# Patient Record
Sex: Female | Born: 1955 | Race: Black or African American | Hispanic: No | Marital: Single | Smoking: Former smoker
Health system: Southern US, Community
[De-identification: ages and names within clinical notes are randomized; demographics above are authoritative.]

## PROBLEM LIST (undated history)

## (undated) DIAGNOSIS — G4730 Sleep apnea, unspecified: Secondary | ICD-10-CM

## (undated) DIAGNOSIS — I219 Acute myocardial infarction, unspecified: Secondary | ICD-10-CM

## (undated) DIAGNOSIS — I509 Heart failure, unspecified: Secondary | ICD-10-CM

## (undated) DIAGNOSIS — I2510 Atherosclerotic heart disease of native coronary artery without angina pectoris: Secondary | ICD-10-CM

## (undated) DIAGNOSIS — J45909 Unspecified asthma, uncomplicated: Secondary | ICD-10-CM

## (undated) DIAGNOSIS — E119 Type 2 diabetes mellitus without complications: Secondary | ICD-10-CM

## (undated) DIAGNOSIS — I6523 Occlusion and stenosis of bilateral carotid arteries: Secondary | ICD-10-CM

## (undated) DIAGNOSIS — I10 Essential (primary) hypertension: Secondary | ICD-10-CM

## (undated) DIAGNOSIS — J449 Chronic obstructive pulmonary disease, unspecified: Secondary | ICD-10-CM

## (undated) DIAGNOSIS — I739 Peripheral vascular disease, unspecified: Secondary | ICD-10-CM

## (undated) HISTORY — DX: Peripheral vascular disease, unspecified: I73.9

## (undated) HISTORY — DX: Occlusion and stenosis of bilateral carotid arteries: I65.23

## (undated) HISTORY — DX: Atherosclerotic heart disease of native coronary artery without angina pectoris: I25.10

---

## 2020-03-28 ENCOUNTER — Inpatient Hospital Stay (HOSPITAL_COMMUNITY)
Admission: EM | Admit: 2020-03-28 | Discharge: 2020-04-11 | DRG: 216 | Disposition: A | Discharge status: Home Health | Payer: Medicaid Other | Attending: Thoracic Surgery (Cardiothoracic Vascular Surgery) | Admitting: Thoracic Surgery (Cardiothoracic Vascular Surgery)

## 2020-03-28 ENCOUNTER — Other Ambulatory Visit: Payer: Self-pay

## 2020-03-28 ENCOUNTER — Emergency Department (HOSPITAL_COMMUNITY): Payer: Medicaid Other

## 2020-03-28 ENCOUNTER — Encounter (HOSPITAL_COMMUNITY): Payer: Self-pay | Admitting: Emergency Medicine

## 2020-03-28 DIAGNOSIS — G8929 Other chronic pain: Secondary | ICD-10-CM | POA: Diagnosis present

## 2020-03-28 DIAGNOSIS — K029 Dental caries, unspecified: Secondary | ICD-10-CM | POA: Diagnosis present

## 2020-03-28 DIAGNOSIS — D62 Acute posthemorrhagic anemia: Secondary | ICD-10-CM | POA: Diagnosis present

## 2020-03-28 DIAGNOSIS — Z7902 Long term (current) use of antithrombotics/antiplatelets: Secondary | ICD-10-CM | POA: Diagnosis not present

## 2020-03-28 DIAGNOSIS — I5043 Acute on chronic combined systolic (congestive) and diastolic (congestive) heart failure: Secondary | ICD-10-CM | POA: Diagnosis not present

## 2020-03-28 DIAGNOSIS — Z7982 Long term (current) use of aspirin: Secondary | ICD-10-CM

## 2020-03-28 DIAGNOSIS — T82855A Stenosis of coronary artery stent, initial encounter: Secondary | ICD-10-CM | POA: Diagnosis present

## 2020-03-28 DIAGNOSIS — I110 Hypertensive heart disease with heart failure: Secondary | ICD-10-CM | POA: Diagnosis present

## 2020-03-28 DIAGNOSIS — E119 Type 2 diabetes mellitus without complications: Secondary | ICD-10-CM

## 2020-03-28 DIAGNOSIS — Z79899 Other long term (current) drug therapy: Secondary | ICD-10-CM

## 2020-03-28 DIAGNOSIS — R079 Chest pain, unspecified: Secondary | ICD-10-CM | POA: Diagnosis not present

## 2020-03-28 DIAGNOSIS — I080 Rheumatic disorders of both mitral and aortic valves: Secondary | ICD-10-CM | POA: Diagnosis present

## 2020-03-28 DIAGNOSIS — Z951 Presence of aortocoronary bypass graft: Secondary | ICD-10-CM | POA: Diagnosis not present

## 2020-03-28 DIAGNOSIS — Z20822 Contact with and (suspected) exposure to covid-19: Secondary | ICD-10-CM | POA: Diagnosis not present

## 2020-03-28 DIAGNOSIS — I10 Essential (primary) hypertension: Secondary | ICD-10-CM

## 2020-03-28 DIAGNOSIS — Y831 Surgical operation with implant of artificial internal device as the cause of abnormal reaction of the patient, or of later complication, without mention of misadventure at the time of the procedure: Secondary | ICD-10-CM | POA: Diagnosis present

## 2020-03-28 DIAGNOSIS — I252 Old myocardial infarction: Secondary | ICD-10-CM

## 2020-03-28 DIAGNOSIS — J449 Chronic obstructive pulmonary disease, unspecified: Secondary | ICD-10-CM | POA: Diagnosis present

## 2020-03-28 DIAGNOSIS — E1151 Type 2 diabetes mellitus with diabetic peripheral angiopathy without gangrene: Secondary | ICD-10-CM | POA: Diagnosis present

## 2020-03-28 DIAGNOSIS — Z955 Presence of coronary angioplasty implant and graft: Secondary | ICD-10-CM | POA: Diagnosis not present

## 2020-03-28 DIAGNOSIS — Z91041 Radiographic dye allergy status: Secondary | ICD-10-CM

## 2020-03-28 DIAGNOSIS — F419 Anxiety disorder, unspecified: Secondary | ICD-10-CM | POA: Diagnosis not present

## 2020-03-28 DIAGNOSIS — D72828 Other elevated white blood cell count: Secondary | ICD-10-CM | POA: Diagnosis not present

## 2020-03-28 DIAGNOSIS — Z794 Long term (current) use of insulin: Secondary | ICD-10-CM

## 2020-03-28 DIAGNOSIS — E7800 Pure hypercholesterolemia, unspecified: Secondary | ICD-10-CM | POA: Diagnosis not present

## 2020-03-28 DIAGNOSIS — Z09 Encounter for follow-up examination after completed treatment for conditions other than malignant neoplasm: Secondary | ICD-10-CM

## 2020-03-28 DIAGNOSIS — I472 Ventricular tachycardia: Secondary | ICD-10-CM | POA: Diagnosis not present

## 2020-03-28 DIAGNOSIS — G4733 Obstructive sleep apnea (adult) (pediatric): Secondary | ICD-10-CM | POA: Diagnosis present

## 2020-03-28 DIAGNOSIS — Z953 Presence of xenogenic heart valve: Secondary | ICD-10-CM

## 2020-03-28 DIAGNOSIS — Z6841 Body Mass Index (BMI) 40.0 and over, adult: Secondary | ICD-10-CM | POA: Diagnosis not present

## 2020-03-28 DIAGNOSIS — Z952 Presence of prosthetic heart valve: Secondary | ICD-10-CM

## 2020-03-28 DIAGNOSIS — J939 Pneumothorax, unspecified: Secondary | ICD-10-CM

## 2020-03-28 DIAGNOSIS — R0789 Other chest pain: Secondary | ICD-10-CM | POA: Diagnosis present

## 2020-03-28 DIAGNOSIS — E785 Hyperlipidemia, unspecified: Secondary | ICD-10-CM | POA: Diagnosis present

## 2020-03-28 DIAGNOSIS — I255 Ischemic cardiomyopathy: Secondary | ICD-10-CM | POA: Diagnosis present

## 2020-03-28 DIAGNOSIS — I350 Nonrheumatic aortic (valve) stenosis: Secondary | ICD-10-CM | POA: Diagnosis not present

## 2020-03-28 DIAGNOSIS — I351 Nonrheumatic aortic (valve) insufficiency: Secondary | ICD-10-CM | POA: Diagnosis not present

## 2020-03-28 DIAGNOSIS — I951 Orthostatic hypotension: Secondary | ICD-10-CM | POA: Diagnosis not present

## 2020-03-28 DIAGNOSIS — J45909 Unspecified asthma, uncomplicated: Secondary | ICD-10-CM

## 2020-03-28 DIAGNOSIS — Z9889 Other specified postprocedural states: Secondary | ICD-10-CM

## 2020-03-28 DIAGNOSIS — I200 Unstable angina: Secondary | ICD-10-CM | POA: Diagnosis not present

## 2020-03-28 DIAGNOSIS — I471 Supraventricular tachycardia: Secondary | ICD-10-CM | POA: Diagnosis not present

## 2020-03-28 DIAGNOSIS — I2510 Atherosclerotic heart disease of native coronary artery without angina pectoris: Secondary | ICD-10-CM | POA: Diagnosis present

## 2020-03-28 DIAGNOSIS — D649 Anemia, unspecified: Secondary | ICD-10-CM | POA: Diagnosis not present

## 2020-03-28 DIAGNOSIS — K922 Gastrointestinal hemorrhage, unspecified: Secondary | ICD-10-CM | POA: Diagnosis present

## 2020-03-28 DIAGNOSIS — Z006 Encounter for examination for normal comparison and control in clinical research program: Secondary | ICD-10-CM | POA: Diagnosis not present

## 2020-03-28 DIAGNOSIS — Z87891 Personal history of nicotine dependence: Secondary | ICD-10-CM

## 2020-03-28 DIAGNOSIS — I4891 Unspecified atrial fibrillation: Secondary | ICD-10-CM | POA: Diagnosis not present

## 2020-03-28 DIAGNOSIS — I25110 Atherosclerotic heart disease of native coronary artery with unstable angina pectoris: Secondary | ICD-10-CM | POA: Diagnosis present

## 2020-03-28 DIAGNOSIS — I480 Paroxysmal atrial fibrillation: Secondary | ICD-10-CM | POA: Diagnosis not present

## 2020-03-28 DIAGNOSIS — D509 Iron deficiency anemia, unspecified: Secondary | ICD-10-CM | POA: Diagnosis not present

## 2020-03-28 DIAGNOSIS — I340 Nonrheumatic mitral (valve) insufficiency: Secondary | ICD-10-CM | POA: Diagnosis not present

## 2020-03-28 DIAGNOSIS — Z01810 Encounter for preprocedural cardiovascular examination: Secondary | ICD-10-CM | POA: Diagnosis not present

## 2020-03-28 HISTORY — DX: Unspecified asthma, uncomplicated: J45.909

## 2020-03-28 HISTORY — DX: Essential (primary) hypertension: I10

## 2020-03-28 HISTORY — DX: Type 2 diabetes mellitus without complications: E11.9

## 2020-03-28 HISTORY — DX: Sleep apnea, unspecified: G47.30

## 2020-03-28 HISTORY — DX: Acute myocardial infarction, unspecified: I21.9

## 2020-03-28 HISTORY — DX: Heart failure, unspecified: I50.9

## 2020-03-28 HISTORY — DX: Chronic obstructive pulmonary disease, unspecified: J44.9

## 2020-03-28 LAB — CBC
HCT: 26.6 % — ABNORMAL LOW (ref 36.0–46.0)
HCT: 27.9 % — ABNORMAL LOW (ref 36.0–46.0)
Hemoglobin: 7.6 g/dL — ABNORMAL LOW (ref 12.0–15.0)
Hemoglobin: 8 g/dL — ABNORMAL LOW (ref 12.0–15.0)
MCH: 24.7 pg — ABNORMAL LOW (ref 26.0–34.0)
MCH: 25 pg — ABNORMAL LOW (ref 26.0–34.0)
MCHC: 28.6 g/dL — ABNORMAL LOW (ref 30.0–36.0)
MCHC: 28.7 g/dL — ABNORMAL LOW (ref 30.0–36.0)
MCV: 86.4 fL (ref 80.0–100.0)
MCV: 87.2 fL (ref 80.0–100.0)
Platelets: 273 10*3/uL (ref 150–400)
Platelets: 240 10*3/uL (ref 150–400)
RBC: 3.08 MIL/uL — ABNORMAL LOW (ref 3.87–5.11)
RBC: 3.2 MIL/uL — ABNORMAL LOW (ref 3.87–5.11)
RDW: 18.5 % — ABNORMAL HIGH (ref 11.5–15.5)
RDW: 17.8 % — ABNORMAL HIGH (ref 11.5–15.5)
WBC: 8.1 10*3/uL (ref 4.0–10.5)
WBC: 8.8 10*3/uL (ref 4.0–10.5)
nRBC: 0 % (ref 0.0–0.2)
nRBC: 0 % (ref 0.0–0.2)

## 2020-03-28 LAB — CBG MONITORING, ED
Glucose-Capillary: 123 mg/dL — ABNORMAL HIGH (ref 70–99)
Glucose-Capillary: 136 mg/dL — ABNORMAL HIGH (ref 70–99)

## 2020-03-28 LAB — BASIC METABOLIC PANEL
Anion gap: 10 (ref 5–15)
BUN: 18 mg/dL (ref 8–23)
CO2: 24 mmol/L (ref 22–32)
Calcium: 8.8 mg/dL — ABNORMAL LOW (ref 8.9–10.3)
Chloride: 106 mmol/L (ref 98–111)
Creatinine, Ser: 0.72 mg/dL (ref 0.44–1.00)
GFR, Estimated: >60 mL/min (ref >60)
Glucose, Bld: 150 mg/dL — ABNORMAL HIGH (ref 70–99)
Potassium: 3.9 mmol/L (ref 3.5–5.1)
Sodium: 140 mmol/L (ref 135–145)

## 2020-03-28 LAB — HIV ANTIBODY (ROUTINE TESTING W REFLEX): HIV Screen 4th Generation wRfx: NONREACTIVE

## 2020-03-28 LAB — TROPONIN I (HIGH SENSITIVITY)
Troponin I (High Sensitivity): 5 ng/L (ref <18)
Troponin I (High Sensitivity): 6 ng/L (ref <18)

## 2020-03-28 LAB — PREPARE RBC (CROSSMATCH)

## 2020-03-28 LAB — FERRITIN: Ferritin: 32 ng/mL (ref 11–307)

## 2020-03-28 LAB — IRON AND TIBC
Iron: 27 ug/dL — ABNORMAL LOW (ref 28–170)
Saturation Ratios: 7 % — ABNORMAL LOW (ref 10.4–31.8)
TIBC: 403 ug/dL (ref 250–450)
UIBC: 376 ug/dL

## 2020-03-28 LAB — SARS CORONAVIRUS 2 (TAT 6-24 HRS): SARS Coronavirus 2: NEGATIVE

## 2020-03-28 LAB — ABO/RH: ABO/RH(D): A POS

## 2020-03-28 MED ORDER — SODIUM CHLORIDE 0.9% IV SOLUTION: Freq: Once | INTRAVENOUS | Status: DC

## 2020-03-28 MED ORDER — INSULIN GLARGINE 100 UNIT/ML ~~LOC~~ SOLN
8.0000 [IU] | Freq: Every day | SUBCUTANEOUS | Status: DC
  Filled 2020-03-28: qty 0.08

## 2020-03-28 MED ORDER — ATORVASTATIN CALCIUM 80 MG PO TABS
80.0000 mg | ORAL_TABLET | Freq: Every day | ORAL | Status: DC
  Administered 2020-03-28 – 2020-04-11 (×14): 80 mg via ORAL
  Filled 2020-03-28 (×14): qty 1

## 2020-03-28 MED ORDER — MONTELUKAST SODIUM 10 MG PO TABS
10.0000 mg | ORAL_TABLET | Freq: Every day | ORAL | Status: DC
Start: 2020-03-28 — End: 2020-04-11
  Administered 2020-03-28 – 2020-04-11 (×14): 10 mg via ORAL
  Filled 2020-03-28 (×14): qty 1

## 2020-03-28 MED ORDER — ALBUTEROL SULFATE HFA 108 (90 BASE) MCG/ACT IN AERS
2.0000 | INHALATION_SPRAY | Freq: Four times a day (QID) | RESPIRATORY_TRACT | Status: DC | PRN
  Filled 2020-03-28: qty 6.7

## 2020-03-28 MED ORDER — ONDANSETRON HCL 4 MG/2ML IJ SOLN
4.0000 mg | Freq: Four times a day (QID) | INTRAMUSCULAR | Status: DC | PRN
  Administered 2020-03-28: 18:00:00 4 mg via INTRAVENOUS
  Filled 2020-03-28: qty 2

## 2020-03-28 MED ORDER — INSULIN GLARGINE 100 UNIT/ML ~~LOC~~ SOLN
15.0000 [IU] | Freq: Every day | SUBCUTANEOUS | Status: DC
  Filled 2020-03-28 (×4): qty 0.15

## 2020-03-28 MED ORDER — ONDANSETRON HCL 4 MG PO TABS: 4.0000 mg | ORAL_TABLET | Freq: Four times a day (QID) | ORAL | Status: DC | PRN

## 2020-03-28 MED ORDER — VITAMIN D 25 MCG (1000 UNIT) PO TABS
2000.0000 [IU] | ORAL_TABLET | Freq: Every day | ORAL | Status: DC
  Administered 2020-03-28 – 2020-04-11 (×14): 2000 [IU] via ORAL
  Filled 2020-03-28 (×14): qty 2

## 2020-03-28 MED ORDER — METOPROLOL SUCCINATE ER 25 MG PO TB24
25.0000 mg | ORAL_TABLET | Freq: Every day | ORAL | Status: DC
  Administered 2020-03-28 – 2020-03-31 (×4): 25 mg via ORAL
  Filled 2020-03-28 (×4): qty 1

## 2020-03-28 MED ORDER — MOMETASONE FURO-FORMOTEROL FUM 200-5 MCG/ACT IN AERO
2.0000 | INHALATION_SPRAY | Freq: Two times a day (BID) | RESPIRATORY_TRACT | Status: DC
  Administered 2020-03-29 – 2020-04-11 (×22): 2 via RESPIRATORY_TRACT
  Filled 2020-03-28 (×2): qty 8.8

## 2020-03-28 MED ORDER — ASPIRIN EC 81 MG PO TBEC
81.0000 mg | DELAYED_RELEASE_TABLET | Freq: Every day | ORAL | Status: DC
  Administered 2020-03-28 – 2020-04-02 (×6): 81 mg via ORAL
  Filled 2020-03-28 (×6): qty 1

## 2020-03-28 MED ORDER — FAMOTIDINE 20 MG PO TABS
20.0000 mg | ORAL_TABLET | Freq: Every day | ORAL | Status: DC
  Administered 2020-03-28 – 2020-04-02 (×6): 20 mg via ORAL
  Filled 2020-03-28 (×6): qty 1

## 2020-03-28 MED ORDER — CLOPIDOGREL BISULFATE 75 MG PO TABS
75.0000 mg | ORAL_TABLET | Freq: Every day | ORAL | Status: DC
  Administered 2020-03-28 – 2020-03-30 (×3): 75 mg via ORAL
  Filled 2020-03-28 (×3): qty 1

## 2020-03-28 MED ORDER — ACETAMINOPHEN 650 MG RE SUPP: 650.0000 mg | Freq: Four times a day (QID) | RECTAL | Status: DC | PRN

## 2020-03-28 MED ORDER — FUROSEMIDE 20 MG PO TABS: 40.0000 mg | ORAL_TABLET | Freq: Every day | ORAL | Status: DC

## 2020-03-28 MED ORDER — CLOPIDOGREL BISULFATE 75 MG PO TABS: 75.0000 mg | ORAL_TABLET | Freq: Every day | ORAL | Status: DC

## 2020-03-28 MED ORDER — SODIUM CHLORIDE 0.9 % IV SOLN: Freq: Once | INTRAVENOUS | Status: AC

## 2020-03-28 MED ORDER — LISINOPRIL 5 MG PO TABS
2.5000 mg | ORAL_TABLET | Freq: Every day | ORAL | Status: DC
  Administered 2020-03-28 – 2020-04-02 (×6): 2.5 mg via ORAL
  Filled 2020-03-28 (×7): qty 1

## 2020-03-28 MED ORDER — FERROUS SULFATE 325 (65 FE) MG PO TABS
325.0000 mg | ORAL_TABLET | Freq: Two times a day (BID) | ORAL | Status: DC
Start: 2020-03-28 — End: 2020-04-11
  Administered 2020-03-28 – 2020-04-11 (×27): 325 mg via ORAL
  Filled 2020-03-28 (×28): qty 1

## 2020-03-28 MED ORDER — ACETAMINOPHEN 325 MG PO TABS
650.0000 mg | ORAL_TABLET | Freq: Four times a day (QID) | ORAL | Status: DC | PRN
  Filled 2020-03-28: qty 2

## 2020-03-28 MED ORDER — INSULIN ASPART 100 UNIT/ML ~~LOC~~ SOLN
0.0000 [IU] | SUBCUTANEOUS | Status: DC
  Administered 2020-03-28 – 2020-03-29 (×2): 1 [IU] via SUBCUTANEOUS
  Administered 2020-03-29: 13:00:00 2 [IU] via SUBCUTANEOUS
  Administered 2020-03-30: 08:00:00 1 [IU] via SUBCUTANEOUS
  Administered 2020-03-30: 17:00:00 5 [IU] via SUBCUTANEOUS
  Administered 2020-03-30: 12:00:00 3 [IU] via SUBCUTANEOUS

## 2020-03-28 MED ORDER — ASPIRIN 81 MG PO TBEC: 81.0000 mg | DELAYED_RELEASE_TABLET | Freq: Every day | ORAL | Status: DC

## 2020-03-28 MED ORDER — PANTOPRAZOLE SODIUM 40 MG IV SOLR
40.0000 mg | INTRAVENOUS | Status: DC
  Administered 2020-03-28 – 2020-04-02 (×6): 40 mg via INTRAVENOUS
  Filled 2020-03-28 (×7): qty 40

## 2020-03-28 MED ORDER — FUROSEMIDE 40 MG PO TABS
40.0000 mg | ORAL_TABLET | Freq: Every day | ORAL | Status: DC
  Administered 2020-03-28 – 2020-04-02 (×6): 40 mg via ORAL
  Filled 2020-03-28 (×3): qty 1
  Filled 2020-03-28: qty 2
  Filled 2020-03-28 (×2): qty 1

## 2020-03-28 NOTE — ED Notes (Signed)
Pt aao4, gcs15, nadn, vss on monitor, reports feeling better, upset because she is npo, pt educated and updated on wait times. Call bell in reach side rails up, ccm in place. Sinus rhythm on monitor.

## 2020-03-28 NOTE — ED Notes (Signed)
Transport paged

## 2020-03-28 NOTE — ED Triage Notes (Signed)
Pt c/o chest pain x2 weeks, states she was recently discharged from [facility] with hgb of 7 due to some "bleeding vessels in my intestines" states she has had cp since leaving the hospital. Has called GI and PCP but has not heard back from either one.

## 2020-03-28 NOTE — Hospital Course (Addendum)
Admitted 03/28/2020  Allergies: Iodine, Peanut-containing drug products, and Statins Pertinent Hx: ***CHF, MI S/P stent 05/2019 hypertension, diabetes, COPD, asthma, sleep apnea, IDDM2, obesity  64 y.o. female p/w worsening of exersional chest pain  * Exersional chest pain: Likely symptomatic anemia 2/2 recent ***. Hb 7.6. Hx of GI bleeding and prior transfusion. She was hospitalized for similar symptoms before: Capsul endoscopy showed bleeding from AVM at [facility] and discharged home on 12/24. Per pt, plan was referring to another facility for push endoscopy. Receiving IVF in ED. Ordering pRBC given symptomatic anemia. NPO, PPI, GI rec is pending. Will continue ASA and plavix for stent placed 05/2019. EDP also consulted cards given cardiac Hx. Rec pending.   *HTN, CHF: CXR with some infiltration but volume status OK on exam. Holding home Lasix and antihypertensive due to soft BP and probable active GI bleeding  Consults: GI, cards Meds: *** VTE ppx: SCDs  IVF: NS Diet: npo until clear by GI   Fleet Contras*  []  f/u post transfusion Hb and assess symptoms after transfusion []  ***f/u Gi rec []  f/u cards rec []  f/u COVID test result  O/N events:   Trop 5>6 COVID pending  BMP Latest Ref Rng & Units 03/28/2020  Glucose 70 - 99 mg/dL )  BUN 8 - 23 mg/dL 18  Creatinine - mg/dL 03/30/2020  Sodium 270(J - 5.00 mmol/L 140  Potassium 3.5 - 5.1 mmol/L 3.9  Chloride 98 - 111 mmol/L 106  CO2 22 - 32 mmol/L 24  Calcium 8.9 - 10.3 mg/dL 9.38)    CBC Latest Ref Rng & Units 03/28/2020  WBC 4.0 - 10.5 K/uL 8.1  Hemoglobin 12.0 - 15.0 g/dL 7.6(L)  Hematocrit 36.0 - 46.0 % 26.6(L)  Platelets 150 - 400 K/uL 273

## 2020-03-28 NOTE — ED Provider Notes (Signed)
[facility] EMERGENCY DEPARTMENT Provider Note   CSN: [id number] Arrival date & time: 03/28/20  5284     History Chief Complaint  Patient presents with  . Chest Pain    Heidi Morse is a 64 y.o. female history of CHF, MI (cardiac catheterization March 202, drug-eluting stent placement to ostial LAD stenosis), hypertension, diabetes, COPD, asthma, sleep apnea.  Patient presents with a chief complaint of chest pain.  Patient reports that her chest pain has been present for the past 3 weeks.  Patient reports that her chest pain is intermittent and only experiences it when she is walking.  Pain  is substernal, does not radiate, not associated with nausea, vomiting or diaphoresis.  Reports that her pain improves with rest and usually takes 15 minutes for her pain to dissipate.  She describes the pain as a burning sensation rated as a 10 on 10 the pain scale.  Patient also complains of shortness of breath with exertion.  patient denies any association with food and pleuritic chest pain.     HPI  HPI: A 65 year old patient with a history of hypertension, hypercholesterolemia and obesity presents for evaluation of chest pain. Initial onset of pain was more than 6 hours ago. The patient's chest pain is worse with exertion. The patient's chest pain is middle- or left-sided, is not well-localized, is not described as heaviness/pressure/tightness, is not sharp and does not radiate to the arms/jaw/neck. The patient does not complain of nausea and denies diaphoresis. The patient has no history of stroke, has no history of peripheral artery disease, has not smoked in the past 90 days, denies any history of treated diabetes and has no relevant family history of coronary artery disease (first degree relative at less than age 48).   Past Medical History:  Diagnosis Date  . Asthma   . CHF (congestive heart failure) (HCC)   . COPD (chronic obstructive pulmonary disease) (HCC)   . Diabetes  mellitus without complication (HCC)   . Hypertension   . MI (myocardial infarction) (HCC)   . Sleep apnea     Patient Active Problem List   Diagnosis Date Noted  . Symptomatic anemia 03/28/2020    History reviewed. No pertinent surgical history.   OB History   No obstetric history on file.     No family history on file.     Home Medications Prior to Admission medications   Medication Sig Start Date End Date Taking? Authorizing Provider  albuterol (VENTOLIN HFA) 108 (90 Base) MCG/ACT inhaler Inhale 2 puffs into the lungs every 6 (six) hours as needed for wheezing. 03/09/20  Yes [provider]  amLODipine (NORVASC) 2.5 MG tablet Take 2.5 mg by mouth daily. 03/11/20  Yes [provider]  aspirin 81 MG EC tablet Take 81 mg by mouth daily. 08/18/18  Yes [provider]  atorvastatin (LIPITOR) 80 MG tablet Take 80 mg by mouth daily. 03/09/20  Yes [provider]  Cholecalciferol 25 MCG (1000 UT) tablet Take 2,000 Units by mouth daily. 02/08/20 05/08/20 Yes [provider]  clopidogrel (PLAVIX) 75 MG tablet Take 75 mg by mouth daily. 02/29/20  Yes [provider]  famotidine (PEPCID) 20 MG tablet Take 20 mg by mouth daily. 12/14/19  Yes [provider]  ferrous sulfate 325 (65 FE) MG tablet Take 1 tablet by mouth 2 (two) times daily. 03/24/20  Yes [provider]  Fluticasone-Salmeterol (ADVAIR) 250-50 MCG/DOSE AEPB Inhale 1 puff into the lungs in the morning  and at bedtime. 01/27/20  Yes [provider]  furosemide (LASIX) 40 MG tablet Take 40 mg by mouth daily. 03/06/20  Yes [provider]  gabapentin (NEURONTIN) 300 MG capsule Take 300 mg by mouth in the morning, at noon, in the evening, and at bedtime. 02/01/20  Yes [provider]  HYDROcodone-acetaminophen (NORCO) 10-325 MG tablet Take 1 tablet by mouth every 12 (twelve) hours. 03/05/20  Yes [provider]  insulin glargine  (LANTUS) 100 UNIT/ML injection Inject 24 Units into the skin 2 (two) times daily as needed (for elevated blood over 150). 11/23/19  Yes [provider]  lisinopril (ZESTRIL) 2.5 MG tablet Take 2.5 mg by mouth daily. 03/24/20  Yes [provider]  metoprolol succinate (TOPROL-XL) 25 MG 24 hr tablet Take 25 mg by mouth daily. 03/24/20  Yes [provider]  montelukast (SINGULAIR) 10 MG tablet Take 10 mg by mouth daily. 01/05/20  Yes [provider]  nitroGLYCERIN (NITROSTAT) 0.4 MG SL tablet Place 0.4 mg under the tongue every 5 (five) minutes as needed for chest pain. 02/08/20 02/07/21 Yes [provider]  valACYclovir (VALTREX) 500 MG tablet Take 500 mg by mouth as needed (for flare ups).   Yes [provider]    Allergies    Iodine, Peanut-containing drug products, and Statins  Review of Systems   Review of Systems  Constitutional: Negative for chills and fever.  Eyes: Negative for visual disturbance.  Respiratory: Positive for cough (productive) and shortness of breath (w/ exertion).   Cardiovascular: Positive for chest pain (w/ exertion).  Gastrointestinal: Negative for abdominal distention, abdominal pain, anal bleeding, blood in stool, nausea and vomiting.  Genitourinary: Negative for difficulty urinating and dysuria.  Musculoskeletal: Negative for back pain and neck pain.  Skin: Negative for color change and rash.  Neurological: Negative for dizziness, syncope, light-headedness and headaches.  Psychiatric/Behavioral: Negative for confusion.    Physical Exam Updated Vital Signs BP 120/67   Pulse 94   Temp 98.2 F (36.8 C) (Oral)   Resp 17   Ht 5' (1.524 m)   Wt 106.1 kg   LMP  (LMP Unknown)   SpO2 100%   BMI 45.70 kg/m   Physical Exam Vitals and nursing note reviewed.  Constitutional:      General: She is not in acute distress.    Appearance: She is not ill-appearing, toxic-appearing or diaphoretic.  HENT:     Head:  Normocephalic.  Eyes:     General: No scleral icterus.       Right eye: No discharge.        Left eye: No discharge.  Neck:     Vascular: No JVD.  Cardiovascular:     Rate and Rhythm: Normal rate.     Heart sounds: Normal heart sounds.  Pulmonary:     Effort: Pulmonary effort is normal.     Breath sounds: Normal breath sounds.  Abdominal:     General: There is no distension.     Palpations: Abdomen is soft. There is no mass or pulsatile mass.     Tenderness: There is no abdominal tenderness.  Musculoskeletal:     Cervical back: Neck supple.     Right lower leg: No edema.     Left lower leg: No edema.  Skin:    General: Skin is warm and dry.  Neurological:     General: No focal deficit present.     Mental Status: She is alert.  Psychiatric:  Behavior: Behavior is cooperative.     ED Results / Procedures / Treatments   Labs (all labs ordered are listed, but only abnormal results are displayed) Labs Reviewed  BASIC METABOLIC PANEL - Abnormal; Notable for the following components:      Result Value   Glucose, Bld 150 (*)    Calcium 8.8 (*)    All other components within normal limits  CBC - Abnormal; Notable for the following components:   RBC 3.08 (*)    Hemoglobin 7.6 (*)    HCT 26.6 (*)    MCH 24.7 (*)    MCHC 28.6 (*)    RDW 18.5 (*)    All other components within normal limits  SARS CORONAVIRUS 2 (TAT 6-24 HRS)  HIV ANTIBODY (ROUTINE TESTING W REFLEX)  HEMOGLOBIN A1C  FERRITIN  IRON AND TIBC  TYPE AND SCREEN  ABO/RH  PREPARE RBC (CROSSMATCH)  TROPONIN I (HIGH SENSITIVITY)  TROPONIN I (HIGH SENSITIVITY)    EKG EKG Interpretation  Date/Time:  Tuesday March 28 2020 07:06:13 EST Ventricular Rate:  81 PR Interval:  140 QRS Duration: 86 QT Interval:  392 QTC Calculation: 455 R Axis:   48 Text Interpretation: Normal sinus rhythm Nonspecific ST abnormality Abnormal ECG Confirmed by Margarita Grizzle [phone]) on 03/28/2020 10:33:40  AM   Radiology DG Chest 2 View  Result Date: 03/28/2020 CLINICAL DATA:  Chest pain 2 weeks EXAM: CHEST - 2 VIEW COMPARISON:  03/24/2020 and prior. FINDINGS: Cardiomegaly and central pulmonary vascular congestion. Patchy bibasilar opacities. No pneumothorax or pleural effusion. No acute osseous abnormality. IMPRESSION: Patchy basilar opacities may reflect atelectasis, edema or infection. Cardiomegaly and central pulmonary vascular congestion. Electronically Signed   By: Stana Bunting M.D.   On: 03/28/2020 08:00    Procedures Procedures (including critical care time)  Medications Ordered in ED Medications  acetaminophen (TYLENOL) tablet 650 mg (has no administration in time range)    Or  acetaminophen (TYLENOL) suppository 650 mg (has no administration in time range)  ondansetron (ZOFRAN) tablet 4 mg (has no administration in time range)    Or  ondansetron (ZOFRAN) injection 4 mg (has no administration in time range)  0.9 %  sodium chloride infusion (Manually program via Guardrails IV Fluids) (has no administration in time range)  albuterol (VENTOLIN HFA) 108 (90 Base) MCG/ACT inhaler 2 puff (has no administration in time range)  atorvastatin (LIPITOR) tablet 80 mg (has no administration in time range)  cholecalciferol (VITAMIN D3) tablet 2,000 Units (has no administration in time range)  famotidine (PEPCID) tablet 20 mg (has no administration in time range)  ferrous sulfate tablet 325 mg (has no administration in time range)  mometasone-formoterol (DULERA) 200-5 MCG/ACT inhaler 2 puff (has no administration in time range)  montelukast (SINGULAIR) tablet 10 mg (has no administration in time range)  insulin glargine (LANTUS) injection 8 Units (has no administration in time range)  insulin aspart (novoLOG) injection 0-9 Units (has no administration in time range)  aspirin EC tablet 81 mg (has no administration in time range)  clopidogrel (PLAVIX) tablet 75 mg (has no administration  in time range)  pantoprazole (PROTONIX) injection 40 mg (has no administration in time range)  0.9 %  sodium chloride infusion ( Intravenous New Bag/Given 03/28/20 1226)    ED Course  I have reviewed the triage vital signs and the nursing notes.  Pertinent labs & imaging results that were available during my care of the patient were reviewed by me and considered in my medical decision  making (see chart for details).    MDM Rules/Calculators/A&P HEAR Score: 4                        Alert 64 year old female. Patient presents with exertional chest pain and shortness of breath has had for the past 3 weeks. Per chart review patient was admitted to [facility] hospital 12/23-24 for acute blood loss anemia, gastrointestinal hemorrhage, and precordial chest pain. Patient had pill endoscopy which showed bleeding AVMS, bleeding scan was performed and found to be negative, she did not receive any transfusion. Per chart review previous hemoglobin was found to be 7.6, hematocrit 24.7, platelets 246.  Patient reports that her exertional chest pain and shortness of breath have not worsened but she is concerned because they have not improved. Patient denies any bleeding, melena, blood in stool. Patient is on Plavix.  CBC showed hemoglobin 7.6, hematocrit 26.6, RBC 3.08, platelets 273. Troponin and was 5 and 6 with delta of +1. With flat troponin less likely ACS. Low risk for PE using Wells criteria for PE.    Likely that patient's exertional shortness of breath and chest pain are related to symptomatic anemia. Will consult hospitalist, GI and cardiology. Type and screen ordered.  Chest x-ray showed patchy basilar opacities may reflect atelectasis, edema or infection. This reading is concerning for possible Covid. COVID-19 test and precautions were ordered.  12:14 spoke with cards manager about consult.    12:25 Spoke with gastroenterology, Dr. Clarita Leber about consult for this patient.    12:40  spoke with hospatilist Dr. Maryla Morrow, she agreed to admit the patient.    13:00 cardiology reports they will not be admitting due to patient's low troponins.    Patient was discussed with and evaluated by Dr. Rosalia Hammers.   Final Clinical Impression(s) / ED Diagnoses Final diagnoses:  Anemia, unspecified type    Rx / DC Orders ED Discharge Orders    None       Haskel Schroeder, PA-C 03/28/20 1558    Margarita Grizzle, MD 03/28/20 [phone]

## 2020-03-28 NOTE — H&P (Addendum)
Date: 03/28/2020               Patient Name:  Heidi Morse MRN: [id number]  DOB: [birth year] Age / Sex: 64 y.o., female   PCP: Heidi Brazil, DO         Medical Service: Internal Medicine Teaching Service         Attending Physician: Dr. Sandre Kitty Elwin Mocha, MD    First Contact: Dr. Laddie Aquas Pager: [phone]  Second Contact: Dr. Karilyn Cota Pager: [phone]       After Hours (After 5p/  First Contact Pager: [phone]  weekends / holidays): Second Contact Pager: [phone]   Chief Complaint: Chest pain  History of Present Illness: 64 year old female with past medical history of CHF, MI S/P ostial LAD stent on March 2021, hypertension, insulin-dependent type 2 diabetes, COPD, asthma, sleep apnea, small bowel AVM, diverticulosis, who presented to [facility] ED this morning due to worsening of chest pain.  No associated shortness of breath except one episode of shortness of breath in ED when she was walking to bathroom.  No palpitation.  Chest pain has been transient and always with activity except last night that she had some chest pain at rest that resolved with nitro.  She has had anemia (likely secondary to GI bleeding) and exertional chest pain and recent admission at [facility].  Recent admission, her hemoglobin was between 7-8, capsule endoscopy showed bleeding of AVM in the small bowel. (Blood per patient, Maralyn Sago needed further evaluation of small bowel and plan was referring her to a tertiary center for which endoscopy).  Patient was discharged home given no active bleeding and stable hemoglobin without need for transfusion. She mentions that the referral plan was not finalized or clarified for her though. She continued to have chest pain with exertion and last night, she also had some chest pain at night that responded to nitroglycerin. She said that she came to [facility] to get some help for her GI issue. She denies any dizziness, lightheadedness, palpitation. her  stool used to be black but no more melena and no bright red per rectum. She denies any nausea vomiting abdominal pain.  Appetite is good and he has a good p.o. tolerance. She has received blood transfusion multiple times and while ago for her GI bleeding but she did not require any transfusion on recent hospitalization.  Of note, patient had a optial LAD stent placement in March 2021 and has been on aspirin and Plavix.  On arrival, BP 118/62, T 98.5, heart rate 98, RR 18.  Patient was comfortable at rest.  Hemoglobin 7.6 (was 7.6, last week).  Troponin 5 and 6.  EKG with no specific ST-T changes.  Patient was a started on IV fluids, pending GI recommendation. Cardiology was also consulted by EDP given patient's cardiac history and chest pain.  [facility] was contacted for admission.  Covid test pending.   Meds:  Current Meds  Medication Sig  . albuterol (VENTOLIN HFA) 108 (90 Base) MCG/ACT inhaler Inhale 2 puffs into the lungs every 6 (six) hours as needed for wheezing.  Marland Kitchen amLODipine (NORVASC) 2.5 MG tablet Take 2.5 mg by mouth daily.  Marland Kitchen aspirin 81 MG EC tablet Take 81 mg by mouth daily.  Marland Kitchen atorvastatin (LIPITOR) 80 MG tablet Take 80 mg by mouth daily.  . Cholecalciferol 25 MCG (1000 UT) tablet Take 2,000 Units by mouth daily.  . clopidogrel (PLAVIX) 75 MG tablet Take 75 mg by mouth daily.  Marland Kitchen  famotidine (PEPCID) 20 MG tablet Take 20 mg by mouth daily.  . ferrous sulfate 325 (65 FE) MG tablet Take 1 tablet by mouth 2 (two) times daily.  . Fluticasone-Salmeterol (ADVAIR) 250-50 MCG/DOSE AEPB Inhale 1 puff into the lungs in the morning and at bedtime.  . furosemide (LASIX) 40 MG tablet Take 40 mg by mouth daily.  Marland Kitchen gabapentin (NEURONTIN) 300 MG capsule Take 300 mg by mouth in the morning, at noon, in the evening, and at bedtime.  Marland Kitchen HYDROcodone-acetaminophen (NORCO) 10-325 MG tablet Take 1 tablet by mouth every 12 (twelve) hours.  . insulin glargine (LANTUS) 100 UNIT/ML injection Inject 24 Units into the  skin 2 (two) times daily as needed (for elevated blood over 150).  Marland Kitchen lisinopril (ZESTRIL) 2.5 MG tablet Take 2.5 mg by mouth daily.  . metoprolol succinate (TOPROL-XL) 25 MG 24 hr tablet Take 25 mg by mouth daily.  . montelukast (SINGULAIR) 10 MG tablet Take 10 mg by mouth daily.  . nitroGLYCERIN (NITROSTAT) 0.4 MG SL tablet Place 0.4 mg under the tongue every 5 (five) minutes as needed for chest pain.  . valACYclovir (VALTREX) 500 MG tablet Take 500 mg by mouth as needed (for flare ups).     Allergies: Allergies as of 03/28/2020 - Review Complete 03/28/2020  Allergen Reaction Noted  . Iodine Other (See Comments) 03/28/2020  . Peanut-containing drug products Hives and Swelling 03/28/2020  . Statins Other (See Comments) 03/28/2020   Past Medical History:  Diagnosis Date  . Asthma   . CHF (congestive heart failure) (HCC)   . COPD (chronic obstructive pulmonary disease) (HCC)   . Diabetes mellitus without complication (HCC)   . Hypertension   . MI (myocardial infarction) (HCC)   . Sleep apnea     Family History: No family history on file.  Social History: Patient lives alone. former smoker.  Denies alcohol or illicit drug use.  Review of Systems: A complete ROS was negative except as per HPI. Constitutional: Negative for chills and fever.  Respiratory: Positive for 1 episode of shortness of breath this morning in ED. Cardiovascular: Positive for chest pain and negative for leg swelling.  Gastrointestinal: Negative for abdominal pain, nausea and vomiting.  Neurological: Negative for dizziness and headaches.   Physical Exam: Blood pressure 123/64, pulse 87, temperature 98.4 F (36.9 C), temperature source Oral, resp. rate 16, height 5' (1.524 m), weight 106.1 kg, SpO2 100 %. Physical Exam Constitutional:      Appearance: She is obese.  HENT:     Head: Normocephalic and atraumatic.  Cardiovascular:     Rate and Rhythm: Normal rate and regular rhythm.     Heart sounds:  Murmur heard.    Pulmonary:     Effort: Pulmonary effort is normal.     Breath sounds: Normal breath sounds. No wheezing, rhonchi or rales.  Musculoskeletal:     Right lower leg: No edema.     Left lower leg: No edema.  Neurological:     Mental Status: She is alert and oriented to person, place, and time.  Psychiatric:        Mood and Affect: Mood normal.    EKG: personally reviewed my interpretation is SNR, nonspecific ST depression  CXR: personally reviewed my interpretation is vascular congestion  Assessment & Plan by Problem: Active Problems:   Symptomatic anemia   Exersional chest pain Likely due to symptomatic anemia likely 2/2 AVM bleeding (vs diverticulosis?)  She was hospitalized recently at [place] [facility] for  similar symptoms (CP on exersion). Apparently, capsul endoscopy showed small bowel AVM bleeding. No active bleeding on bleeding scan there. (NM GI bleed 12/23: No evidence for active GI bleeding.) Per patient, she was supposed to be sent to a tertiary center for push endoscopy but she was discharged before knowing the final plan. She continued to have chest pain with activity and last night, she developed some chest pain at rest and came to [facility] to get more help. Hb 7.6 on arrival. Similar to similar days ago when she was discharged from [facility] center. (Hb 12/23 was 8.6>8 12/24: 7.6 in [place].). She had a history transfusion before but did not receive transfusion there.   -s/p NS in ED -Giving 1 unit of PRBC for symptomatic anemia -F/u post transfusion Hb -Appreciate GI rec -NPO pending GI rec -Continue dual anti plt therapy for the coronary stent (placed last March), unless Hb drops or pt develops evidence of active bleeding. -Continue home ferrous sulfate when PO -Iron, Ferritin, TIBC and consider Iron transfusion if needed  HTN HF (with reduced EF?) CAD/MI s/p osteal LAD stent 05/2019: CXR with some congestion but lung exam  is unremarkable and no significant hypervolemia.  -Continue ASA and plavix unless GI recommend to hold. (Hb stable since being discharged from hospital 12/24.) -Continue home lipitor -Holding amlodipine given soft BP. -Home Metoprolol succinate and lisinopril w holding parameter.  -Also giving home dose of lasix as we are transfusing her and given some congestion on CXR. -Intake and out put -weight daily  Asthma COPD Sleep apnea  -Stable currently. Continue home meds and CPAP at night (pt reports being on BiPAP for sleep apnea at home.)  IDDM2: -Insulin 15 u at bed time + SSI -CBG monitoring  Dispo: Admit patient to Inpatient with expected length of stay greater than 2 midnights.  Diet: NPO IV fluid: NS VTE ppx: SCDs Code status: Full  Signed: Chevis Pretty, MD 03/28/2020, 5:32 PM  Pager: [phone] After 5pm on weekdays and 1pm on weekends: On Call pager: [phone]

## 2020-03-28 NOTE — Consult Note (Addendum)
[facility] Consult: 12:30 PM 03/28/2020  LOS: 0 days    Referring Provider: Effie Berkshire.   Primary Care Physician:  Leola Brazil, DO in [facility] Primary Gastroenterologist:  Dr Rayna Sexton In [facility].  Unassigned at [facility] hospital  All previous care primarily in [facility] and outpt offices.    Reason for Consultation:  Anemia.   Recent GIB   HPI: Heidi Morse is a 64 y.o. female.  PMH heart failure, LVEF not known.  COPD.  IDDM.  MI.  CAD, cardiac stent placed 08/2018, 05/2019, on chronic Plavix.  OSA.  Chronic pain on narcotics.  Noncardiac chest pain, costochondritis.  Aortic stenosis.  CHF, cardiomyopathy.  Morbid obesity, BMI greater than 40.  Peripheral vascular disease.    For chronic non-cardiac chest pain she had CT angiography of chest in 09/2019, study negative for PE.  + for minor bilateral pleural effusions and dependent atelectasis but no pneumonia.  Mild groundglass opacities nonspecific. 09/29/2019 CTAP with enterography revealed no inflammatory bowel disease, no small bowel pathology.  Uncomplicated colonic diverticulosis.  Mild hepatic steatosis and small, benign left renal adenoma  Hx GI bleeding and blood loss anemia requiring transfusions.  Follow[facility] in [facility] for these issuesThis year 2021 she has not gotten any additional PRBCs but she has been treated by hematology with parenteral iron on several occasions and she takes chronic oral iron.   09/2014 EGD.  Normal study 12/2014 colonoscopy with tubular adenomas and diverticulosis. 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  Benign gastric polyp removed, widely patent Schatzki's  ring. 02/22/2020 colonoscopy showed left-sided diverticulosis and nonbleeding internal hemorrhoids. 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 03/21/20 Capsule endoscopy showing bleeding from AVMs, however do not have access to the report so the exact location of these AVMs is unclear other than that there in the small bowel.  But the GI physicians in [facility] mentioned referral her tertiary GI center where she can undergo push enteroscopy.  Admission at [facility] for non-exertional chesp pain, anemia 12/23 -12/24/202.   During the brief admit Hgb 8.6 on admission, 7.6 on 12/24. She did not require transfusion with blood products. Troponins and EKG non-ischemic.    03/23/2020 nuclear medicine bleeding scan was negative for active GI bleeding.  No endoscopy performed.   At discharge Plavix, 81 ASA resumed along with omeprazole 40 mg bid.  Patient returned to the [facility] ED yesterday afternoon 12/27 for ongoing midsternal chest pain, no other associated symptoms.  The chest pain is relieved by nitroglycerin but previous MDs have said not to use this since the chest pain is not felt to be cardiac in origin.  EKG yesterday was nonischemic, stable compared with 4 days previous.  She was discharged home.  Patient's family is frustrated because the patient has persistent noncardiac chest pain so they brought her to [facility] hospital for evaluation.  Chest pain for which she went to the hospital before Christmas  is persistent.  It is worse at bedtime/during the night and can wake her up.  It is not exertional.  It is not exacerbated by liquid or solid PO.  Location is midsternal. No nausea or vomiting.  No abdominal pain.  Stools stable in appearance, chronically dark brown due to oral iron.  No blood per rectum. Hgb 7.6.  Normal MCV, platelets, WBCs, BUN.  No family hx sickle cell disease.   No NSAIDs.  Uses 5 to 10 percocet per week for MS and chest pain.  No ETOH     Past  Medical History:  Diagnosis Date  . Asthma   . CHF (congestive heart failure) (HCC)   . COPD (chronic obstructive pulmonary disease) (HCC)   . Diabetes mellitus without complication (HCC)   . Hypertension   . MI (myocardial infarction) (HCC)   . Sleep apnea     History reviewed. No pertinent surgical history.  Prior to Admission medications   Medication Sig Start Date End Date Taking? Authorizing Provider  albuterol (VENTOLIN HFA) 108 (90 Base) MCG/ACT inhaler Inhale 2 puffs into the lungs every 6 (six) hours as needed for wheezing. 03/09/20  Yes [provider]  amLODipine (NORVASC) 2.5 MG tablet Take 2.5 mg by mouth daily. 03/11/20  Yes [provider]  aspirin 81 MG EC tablet Take 81 mg by mouth daily. 08/18/18  Yes [provider]  atorvastatin (LIPITOR) 80 MG tablet Take 80 mg by mouth daily. 03/09/20  Yes [provider]  Cholecalciferol 25 MCG (1000 UT) tablet Take 2,000 Units by mouth daily. 02/08/20 05/08/20 Yes [provider]  clopidogrel (PLAVIX) 75 MG tablet Take 75 mg by mouth daily. 02/29/20  Yes [provider]  famotidine (PEPCID) 20 MG tablet Take 20 mg by mouth daily. 12/14/19  Yes [provider]  ferrous sulfate 325 (65 FE) MG tablet Take 1 tablet by mouth 2 (two) times daily. 03/24/20  Yes [provider]  Fluticasone-Salmeterol (ADVAIR) 250-50 MCG/DOSE AEPB Inhale 1 puff into the lungs in the morning and at bedtime. 01/27/20  Yes [provider]  furosemide (LASIX) 40 MG tablet Take 40 mg by mouth daily. 03/06/20  Yes [provider]  gabapentin (NEURONTIN) 300 MG capsule Take 300 mg by mouth in the morning, at noon, in the evening, and at bedtime. 02/01/20  Yes [provider]  HYDROcodone-acetaminophen (NORCO) 10-325 MG tablet Take 1 tablet by mouth every 12 (twelve) hours. 03/05/20  Yes [provider]  insulin glargine (LANTUS) 100 UNIT/ML injection Inject 24 Units  into the skin 2 (two) times daily as needed (for elevated blood over 150). 11/23/19  Yes [provider]  lisinopril (ZESTRIL) 2.5 MG tablet Take 2.5 mg by mouth daily. 03/24/20  Yes [provider]  metoprolol succinate (TOPROL-XL) 25 MG 24 hr tablet Take 25 mg by mouth daily. 03/24/20  Yes [provider]  montelukast (SINGULAIR) 10 MG tablet Take 10 mg by mouth daily. 01/05/20  Yes [provider]  nitroGLYCERIN (NITROSTAT) 0.4 MG SL tablet Place 0.4 mg under the tongue every 5 (five) minutes as needed for chest pain. 02/08/20 02/07/21 Yes [provider]  valACYclovir (VALTREX) 500 MG tablet Take 500 mg by mouth as needed (for flare ups).   Yes [provider]    Scheduled Meds:  Infusions:  PRN Meds:    Allergies as of 03/28/2020 - Review Complete 03/28/2020  Allergen Reaction Noted  . Iodine Other (See Comments) 03/28/2020  . Peanut-containing  drug products Hives and Swelling 03/28/2020  . Statins Other (See Comments) 03/28/2020    No family history on file.  Social History   Socioeconomic History  . Marital status: Single    Spouse name: Not on file  . Number of children: Not on file  . Years of education: Not on file  . Highest education level: Not on file  Occupational History  . Not on file  Tobacco Use  . Smoking status: Not on file  . Smokeless tobacco: Not on file  Substance and Sexual Activity  . Alcohol use: Not on file  . Drug use: Not on file  . Sexual activity: Not on file  Other Topics Concern  . Not on file  Social History Narrative  . Not on file   Social Determinants of Health   Financial Resource Strain: Not on file  Food Insecurity: Not on file  Transportation Needs: Not on file  Physical Activity: Not on file  Stress: Not on file  Social Connections: Not on file  Intimate Partner Violence: Not on file    REVIEW OF SYSTEMS: Constitutional: No fatigue or weakness ENT:  No nose  bleeds Pulm: Chest pain not exacerbated by deep breathing.  No dyspnea.  No cough CV:  No palpitations, no LE edema.  GU:  No hematuria, no frequency GI: No dysphagia.  No sores in her mouth Heme: No unusual or excessive bleeding or bruising Transfusions: Transfused in the past see HPI. Neuro:  No headaches, no peripheral tingling or numbness.  No syncope, no seizure Derm:  No itching, no rash or sores.  Endocrine:  No sweats or chills.  No polyuria or dysuria Immunization: Not queried Travel:  None beyond local counties in last few months.    PHYSICAL EXAM: Vital signs in last 24 hours: Vitals:   03/28/20 1115 03/28/20 1200  BP: 97/61 (!) 98/49  Pulse: 78 84  Resp: 14 (!) 24  Temp:    SpO2: 99% 96%   Wt Readings from Last 3 Encounters:  03/28/20 106.1 kg    General: Obese, looks well.  Looks comfortable.  Alert Head: No facial asymmetry or swelling.  No signs of head trauma. Eyes: No conjunctival pallor or scleral icterus Ears: Not hard of hearing Nose: No congestion or discharge Mouth: Excellent dentition.  Mucosa is moist, pink, clear.  Tongue midline Neck: No thyromegaly.  No masses, no JVD Lungs: Clear bilaterally.  No dyspnea, no cough Heart: RRR.  No MRG.  S1, S2 present.  No sternal tenderness Abdomen: Soft without tenderness.  Active bowel sounds.  Obese, soft.   Rectal: Not performed Musc/Skeltl: No joint redness, swelling or gross deformity Extremities: No CCE Neurologic: Alert.  Oriented x3.  Moves all 4 limbs, strength not tested.  No tremors Skin: No rash, no sores, no telangiectasia, no significant purpura or hematomas. Nodes: No cervical adenopathy Psych: Cooperative, calm, fluid speech  Intake/Output from previous day: No intake/output data recorded. Intake/Output this shift: No intake/output data recorded.  LAB RESULTS: Recent Labs    03/28/20 0739  WBC 8.1  HGB 7.6*  HCT 26.6*  PLT 273   BMET Lab Results  Component Value Date   NA 140  03/28/2020   K 3.9 03/28/2020   CL 106 03/28/2020   CO2 24 03/28/2020   GLUCOSE 150 (H) 03/28/2020   BUN 18 03/28/2020   CREATININE 0.72 03/28/2020   CALCIUM 8.8 (L) 03/28/2020   LFT No results for input(s): PROT, ALBUMIN, AST, ALT, ALKPHOS,  BILITOT, BILIDIR, IBILI in the last 72 hours. PT/INR No results found for: INR, PROTIME Hepatitis Panel No results for input(s): HEPBSAG, HCVAB, HEPAIGM, HEPBIGM in the last 72 hours. C-Diff No components found for: CDIFF Lipase  No results found for: LIPASE  Drugs of Abuse  No results found for: LABOPIA, COCAINSCRNUR, LABBENZ, AMPHETMU, THCU, LABBARB   RADIOLOGY STUDIES: DG Chest 2 View  Result Date: 03/28/2020 CLINICAL DATA:  Chest pain 2 weeks EXAM: CHEST - 2 VIEW COMPARISON:  03/24/2020 and prior. FINDINGS: Cardiomegaly and central pulmonary vascular congestion. Patchy bibasilar opacities. No pneumothorax or pleural effusion. No acute osseous abnormality. IMPRESSION: Patchy basilar opacities may reflect atelectasis, edema or infection. Cardiomegaly and central pulmonary vascular congestion. Electronically Signed   By: Stana Bunting M.D.   On: 03/28/2020 08:00     IMPRESSION:   *  Anemia.  Hgb 7.6, stable/same as on 12/24 Longstanding history of anemia requiring transfusions in 2019 and 2020. Feraheme periodically. Chronic po iron. Followed by GI and heme in [place]. 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  Multiple endoscopic studies see HPI.  The most recent studies were enteroscopy and colonoscopy on 11/23 with ablation of 3 jejunal AVMs. 03/22/2019 1 capsule endoscopy showed bleeding from AVMs, not clear the location and not able to access the actual endoscopy report. Chronic Omeprazole 40 mg bid.    *   Hx CAD, MI, cardiac stents, cardiomyopathy. Chronic low-dose aspirin and Plavix.  *    Hx noncardiac chest pain, costochondritis.  Chest pain during pre-Christmas admission with normal troponins   PLAN:     *   Will  be seen by Dr Chales Abrahams.  No plans for repeating EGD or enteroscopy, she needs to return to GI Dr Rayna Sexton for further planning, ? Repeat enteroscopy, ? Referral to tertiary GI where deep enteroscopy is available.  Dr B has access to the capsule study of 11/23 and can make more informed decisions.   Made appt w PA in [place] [facility] for Feb 1, soonest available appt but she is on cancellation call list, so if there is a cancellation, she will have sooner appt.     Jennye Moccasin  03/28/2020, 12:30 PM Phone [phone]   Attending physician's note   I have taken an interval history, reviewed the chart and examined the patient. I agree with the Advanced Practitioner's note, impression and recommendations.   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  Followed by Dr Rayna Sexton.  Extensive GI [facility] including most recent push enteroscopy with jejunal AVM ablation, colon 11/23. VCE showing AVMs (beyond reach of push enteroscope). Dr B planning SBE/DBE @ [facility]. Pt on chronic iron supplementation (PO and IV). Baseline Hb 7.6-8.6.  No active bleeding. Hb @ baseline.  H/O CAD s/p DES 05/2019 on ASA/Plavix.  Noncardiac chest pains.  Plan: -Trend CBC.  Transfuse if needed. -No GI intervention planned this adm. -FU appt with [facility] for further eval (?Single balloon/DBE). Maralyn Sago has made appt for Feb 1, sooner if there is any cancellation. -She will also FU with hematology. -Resume aspirin/Plavix since there is no active bleeding. -Avoid OTC NSAIDs -Continue Protonix. -Pl call if with any ?/Change in clinical status.     Edman Circle, MD [facility] [phone]

## 2020-03-29 ENCOUNTER — Inpatient Hospital Stay (HOSPITAL_COMMUNITY): Payer: Medicaid Other

## 2020-03-29 ENCOUNTER — Other Ambulatory Visit: Payer: Self-pay

## 2020-03-29 DIAGNOSIS — I351 Nonrheumatic aortic (valve) insufficiency: Secondary | ICD-10-CM | POA: Diagnosis not present

## 2020-03-29 DIAGNOSIS — I350 Nonrheumatic aortic (valve) stenosis: Secondary | ICD-10-CM

## 2020-03-29 DIAGNOSIS — I200 Unstable angina: Secondary | ICD-10-CM | POA: Diagnosis not present

## 2020-03-29 DIAGNOSIS — J45909 Unspecified asthma, uncomplicated: Secondary | ICD-10-CM | POA: Diagnosis not present

## 2020-03-29 DIAGNOSIS — R079 Chest pain, unspecified: Secondary | ICD-10-CM

## 2020-03-29 DIAGNOSIS — D649 Anemia, unspecified: Secondary | ICD-10-CM | POA: Diagnosis not present

## 2020-03-29 DIAGNOSIS — I10 Essential (primary) hypertension: Secondary | ICD-10-CM | POA: Diagnosis not present

## 2020-03-29 LAB — ECHOCARDIOGRAM COMPLETE
AR max vel: 0.83 cm2
AV Area VTI: 0.93 cm2
AV Area mean vel: 0.94 cm2
AV Mean grad: 22.5 mmHg
AV Peak grad: 44.2 mmHg
Ao pk vel: 3.33 m/s
Area-P 1/2: 3.72 cm2
Height: 60 in
MV M vel: 5.34 m/s
MV Peak grad: 114.1 mmHg
P 1/2 time: 255 msec
Radius: 0.6 cm
S' Lateral: 3.9 cm
Weight: 3577.6 oz

## 2020-03-29 LAB — GLUCOSE, CAPILLARY
Glucose-Capillary: 104 mg/dL — ABNORMAL HIGH (ref 70–99)
Glucose-Capillary: 113 mg/dL — ABNORMAL HIGH (ref 70–99)
Glucose-Capillary: 122 mg/dL — ABNORMAL HIGH (ref 70–99)
Glucose-Capillary: 123 mg/dL — ABNORMAL HIGH (ref 70–99)
Glucose-Capillary: 132 mg/dL — ABNORMAL HIGH (ref 70–99)
Glucose-Capillary: 141 mg/dL — ABNORMAL HIGH (ref 70–99)

## 2020-03-29 LAB — COMPREHENSIVE METABOLIC PANEL
ALT: 12 U/L (ref 0–44)
AST: 12 U/L — ABNORMAL LOW (ref 15–41)
Albumin: 3 g/dL — ABNORMAL LOW (ref 3.5–5.0)
Alkaline Phosphatase: 77 U/L (ref 38–126)
Anion gap: 10 (ref 5–15)
BUN: 14 mg/dL (ref 8–23)
CO2: 25 mmol/L (ref 22–32)
Calcium: 8.5 mg/dL — ABNORMAL LOW (ref 8.9–10.3)
Chloride: 106 mmol/L (ref 98–111)
Creatinine, Ser: 0.68 mg/dL (ref 0.44–1.00)
GFR, Estimated: >60 mL/min (ref >60)
Glucose, Bld: 124 mg/dL — ABNORMAL HIGH (ref 70–99)
Potassium: 3.8 mmol/L (ref 3.5–5.1)
Sodium: 141 mmol/L (ref 135–145)
Total Bilirubin: 0.6 mg/dL (ref 0.3–1.2)
Total Protein: 5.9 g/dL — ABNORMAL LOW (ref 6.5–8.1)

## 2020-03-29 LAB — HEMOGLOBIN A1C
Hgb A1c MFr Bld: 5.2 % (ref 4.8–5.6)
Mean Plasma Glucose: 102.54 mg/dL

## 2020-03-29 LAB — CBC
HCT: 26.7 % — ABNORMAL LOW (ref 36.0–46.0)
Hemoglobin: 8.3 g/dL — ABNORMAL LOW (ref 12.0–15.0)
MCH: 26 pg (ref 26.0–34.0)
MCHC: 31.1 g/dL (ref 30.0–36.0)
MCV: 83.7 fL (ref 80.0–100.0)
Platelets: 258 10*3/uL (ref 150–400)
RBC: 3.19 MIL/uL — ABNORMAL LOW (ref 3.87–5.11)
RDW: 18 % — ABNORMAL HIGH (ref 11.5–15.5)
WBC: 7.8 10*3/uL (ref 4.0–10.5)
nRBC: 0 % (ref 0.0–0.2)

## 2020-03-29 LAB — TYPE AND SCREEN
ABO/RH(D): A POS
Antibody Screen: NEGATIVE
Unit division: 0

## 2020-03-29 LAB — BPAM RBC
Blood Product Expiration Date: 202201202359
ISSUE DATE / TIME: 202112281613
Unit Type and Rh: 6200

## 2020-03-29 MED ORDER — SODIUM CHLORIDE 0.9% FLUSH
3.0000 mL | Freq: Two times a day (BID) | INTRAVENOUS | Status: DC
Start: 2020-03-29 — End: 2020-04-03
  Administered 2020-03-29 – 2020-04-02 (×4): 3 mL via INTRAVENOUS

## 2020-03-29 MED ORDER — SODIUM CHLORIDE 0.9% FLUSH: 3.0000 mL | INTRAVENOUS | Status: DC | PRN

## 2020-03-29 MED ORDER — COVID-19 MRNA VACC (MODERNA) 50 MCG/0.25ML IM SUSP: 0.2500 mL | Freq: Once | INTRAMUSCULAR | Status: DC

## 2020-03-29 MED ORDER — SODIUM CHLORIDE 0.9 % WEIGHT BASED INFUSION
1.0000 mL/kg/h | INTRAVENOUS | Status: DC
Start: 2020-03-30 — End: 2020-03-30
  Administered 2020-03-30: 07:00:00 1 mL/kg/h via INTRAVENOUS

## 2020-03-29 MED ORDER — SODIUM CHLORIDE 0.9 % IV SOLN
510.0000 mg | Freq: Once | INTRAVENOUS | Status: AC
  Administered 2020-03-29: 11:00:00 510 mg via INTRAVENOUS
  Filled 2020-03-29: qty 17

## 2020-03-29 MED ORDER — ASPIRIN 81 MG PO CHEW
81.0000 mg | CHEWABLE_TABLET | ORAL | Status: AC
  Administered 2020-03-30: 07:00:00 81 mg via ORAL
  Filled 2020-03-29: qty 1

## 2020-03-29 MED ORDER — SODIUM CHLORIDE 0.9 % IV SOLN: 250.0000 mL | INTRAVENOUS | Status: DC | PRN

## 2020-03-29 MED ORDER — SODIUM CHLORIDE 0.9 % WEIGHT BASED INFUSION
3.0000 mL/kg/h | INTRAVENOUS | Status: DC
Start: 2020-03-30 — End: 2020-03-30
  Administered 2020-03-30: 05:00:00 3 mL/kg/h via INTRAVENOUS

## 2020-03-29 NOTE — H&P (View-Only) (Signed)
Cardiology Consultation:   Patient ID: Heidi Morse MRN: [id number]; DOB: 1955-11-22  Admit date: 03/28/2020 Date of Consult: 03/29/2020  Primary Care Provider: Arlyn DunningEverly, Rebecca B, DO [facility] Cardiologist: [facility] Electrophysiologist:  None    Patient Profile:   Heidi Morse is a 64 y.o. female with a hx of bilateral carotid artery stenosis s/p stent placement, chronic systolic CHF, CAD s/p stenting to left main, Ost Cx and Ost LAD, HTN, ischemic CM, nonrheumatic arotic valve stenosis, DM2, PAD s/p stenting of the left common iliac artery, chronic pain and obesity who is being seen today for the evaluation of chest pain and CHF at the request of Dr. Sandre Kittyaines.  History of Present Illness:   Heidi Morse has been seen by cardiology at outside hospitals. Chart review says between 2012 and June 2020 the patient had 5 trips to the cath lab to evaluated for chest pain all of which showed nonobstructive CAD. From June 2020 to March 2021 the patient had 5 additional trips to the cath lab and has been treated with stenting to the left main, ostial Cx, and ostial LAD. She has been seen by cardiothoracic surgery on 2 occasions and it was felt CAD could be better treated with PCI. She has chronic chest pain which some is felt to be noncardiac. She is on aspirin, plavix, metoprolol, amlodipine, and atorvastatin. She also has a history of systolic heart failure. Echo from 05/2019 showed EF 45-50% with global hypokinesis, presumed to be ischemic. She takes lisinopril, BB, and lasix 40 mg daily. Echo from 05/2019 showed mild to mod AS with a mean gradient of 18mmHg and mild MR. Plan to assess in a year.   [facility] and recent GIB. Hgb was down to 7.6, she did not require transfusion. She had Chest pain and troponins were negative. EKG was non-ischemic. Imaging was negative for active bleed. She was discharged on plavix and aspirin.   [facility] relieved by Nitro. It was felt to be not cardiac in origin. EKG was stable. She was discharged home. Family was frustrated [facility]   She reports chest pain that has been occurring for the last 4 months. She knew she had low hemoglobin so thought it was due to that. However chest discomfort has nit improved. It is worse with any type of exertion. It is substernal and non-radiating. She denies N/V or diaphoresis. She took a Nitro 2 days ago which improved the pain. She occasionally gets sob. No recent fever or chills.   In the ED BP 118/62, pulse 98, RR 18. Hgb 7.6. Troponin 5>6. EKG showed SR with nonspecific T wave changes. CXR showed cardiomegaly and central pulmonary vascular congestion.  Patient was given IVF. GI consulted. She was admitted for further work-up.   Past Medical History:  Diagnosis Date  . Asthma   . CHF (congestive heart failure) (HCC)   . COPD (chronic obstructive pulmonary disease) (HCC)   . Diabetes mellitus without complication (HCC)   . Hypertension   . MI (myocardial infarction) (HCC)   . Sleep apnea     History reviewed. No pertinent surgical history.   Home Medications:  Prior to Admission medications   Medication Sig Start Date End Date Taking? Authorizing Provider  albuterol (VENTOLIN HFA) 108 (90 Base) MCG/ACT inhaler Inhale 2 puffs into the lungs every 6 (six) hours as needed for wheezing. 03/09/20  Yes [provider]  amLODipine (NORVASC) 2.5 MG tablet Take 2.5 mg by mouth daily. 03/11/20  Yes [provider]  aspirin 81 MG EC tablet Take 81 mg by mouth daily. 08/18/18  Yes [provider]  atorvastatin (LIPITOR) 80 MG tablet Take 80 mg by mouth daily. 03/09/20  Yes [provider]  Cholecalciferol 25 MCG (1000 UT) tablet Take 2,000 Units by mouth daily. 02/08/20 05/08/20 Yes [provider]  clopidogrel (PLAVIX) 75 MG tablet Take 75 mg by mouth daily.  02/29/20  Yes [provider]  famotidine (PEPCID) 20 MG tablet Take 20 mg by mouth daily. 12/14/19  Yes [provider]  ferrous sulfate 325 (65 FE) MG tablet Take 1 tablet by mouth 2 (two) times daily. 03/24/20  Yes [provider]  Fluticasone-Salmeterol (ADVAIR) 250-50 MCG/DOSE AEPB Inhale 1 puff into the lungs in the morning and at bedtime. 01/27/20  Yes [provider]  furosemide (LASIX) 40 MG tablet Take 40 mg by mouth daily. 03/06/20  Yes [provider]  gabapentin (NEURONTIN) 300 MG capsule Take 300 mg by mouth in the morning, at noon, in the evening, and at bedtime. 02/01/20  Yes [provider]  HYDROcodone-acetaminophen (NORCO) 10-325 MG tablet Take 1 tablet by mouth every 12 (twelve) hours. 03/05/20  Yes [provider]  insulin glargine (LANTUS) 100 UNIT/ML injection Inject 24 Units into the skin 2 (two) times daily as needed (for elevated blood over 150). 11/23/19  Yes [provider]  lisinopril (ZESTRIL) 2.5 MG tablet Take 2.5 mg by mouth daily. 03/24/20  Yes [provider]  metoprolol succinate (TOPROL-XL) 25 MG 24 hr tablet Take 25 mg by mouth daily. 03/24/20  Yes [provider]  montelukast (SINGULAIR) 10 MG tablet Take 10 mg by mouth daily. 01/05/20  Yes [provider]  nitroGLYCERIN (NITROSTAT) 0.4 MG SL tablet Place 0.4 mg under the tongue every 5 (five) minutes as needed for chest pain. 02/08/20 02/07/21 Yes [provider]  valACYclovir (VALTREX) 500 MG tablet Take 500 mg by mouth as needed (for flare ups).   Yes [provider]    Inpatient Medications: Scheduled Meds: . sodium chloride   Intravenous Once  . aspirin EC  81 mg Oral Daily  . atorvastatin  80 mg Oral Daily  . cholecalciferol  2,000 Units Oral Daily  . clopidogrel  75 mg Oral Daily  . famotidine  20 mg Oral Daily  . ferrous sulfate  325 mg Oral BID  . furosemide  40 mg Oral Daily  . insulin  aspart  0-9 Units Subcutaneous Q4H  . insulin glargine  15 Units Subcutaneous QHS  . lisinopril  2.5 mg Oral Daily  . metoprolol succinate  25 mg Oral Daily  . mometasone-formoterol  2 puff Inhalation BID  . montelukast  10 mg Oral Daily  . pantoprazole (PROTONIX) IV  40 mg Intravenous Q24H   Continuous Infusions:  PRN Meds: acetaminophen **OR** acetaminophen, albuterol, ondansetron **OR** ondansetron (ZOFRAN) IV  Allergies:    Allergies  Allergen Reactions  . Iodine Other (See Comments)    unk reaction per pt  . Peanut-Containing Drug Products Hives and Swelling  . Statins Other (See Comments)    myalgia    Social History:   Social History   Socioeconomic History  . Marital status: Single    Spouse name: Not on file  . Number of children: Not on file  . Years of education: Not on file  . Highest   education level: Not on file  Occupational History  . Not on file  Tobacco Use  . Smoking status: Not on file  . Smokeless tobacco: Not on file  Substance and Sexual Activity  . Alcohol use: Not on file  . Drug use: Not on file  . Sexual activity: Not on file  Other Topics Concern  . Not on file  Social History Narrative  . Not on file   Social Determinants of Health   Financial Resource Strain: Not on file  Food Insecurity: Not on file  Transportation Needs: Not on file  Physical Activity: Not on file  Stress: Not on file  Social Connections: Not on file  Intimate Partner Violence: Not on file    Family History:   No family history on file.   ROS:  Please see the history of present illness.  All other ROS reviewed and negative.     Physical Exam/Data:   Vitals:   03/29/20 0015 03/29/20 0356 03/29/20 0716 03/29/20 1154  BP: 132/71 92/65 126/75 126/73  Pulse: 86 83 73 73  Resp: Temp: 98.5 F (36.9 C) 98.6 F (37 C) 98.4 F (36.9 C) 98.7 F (37.1 C)  TempSrc: Oral Oral Oral Oral  SpO2: 100% 97% 99% 99%  Weight: 101.4 kg     Height: 5'  (1.524 m)       Intake/Output Summary (Last 24 hours) at 03/29/2020 1335 Last data filed at 03/29/2020 0804 Gross per 24 hour  Intake 1980 ml  Output 400 ml  Net 1580 ml   Last 3 Weights 03/29/2020 03/28/2020  Weight (lbs) 223 lb 9.6 oz 234 lb  Weight (kg) 101.424 kg 106.142 kg     Body mass index is 43.67 kg/m.  General:  Well nourished, well developed, in no acute distress HEENT: normal Lymph: no adenopathy Neck: no JVD Endocrine:  No thryomegaly Vascular: No carotid bruits; FA pulses 2+ bilaterally without bruits  Cardiac:  normal S1, S2; RRR; + murmur  Lungs:  clear to auscultation bilaterally, no wheezing, rhonchi or rales  Abd: soft, nontender, no hepatomegaly  Ext: no edema Musculoskeletal:  No deformities, BUE and BLE strength normal and equal Skin: warm and dry  Neuro:  CNs 2-12 intact, no focal abnormalities noted Psych:  Normal affect   EKG:  The EKG was personally reviewed and demonstrates:  NSR, 81 bpm, nonspecific T wave changes Telemetry:  Telemetry was personally reviewed and demonstrates:  ST, HE 70s  Relevant CV Studies:  Echo ordered  Cardiac Cath 06/25/2019 This result has an attachment that is not available.  Severe ostial LAD stenosis, iFR .70  Widely patent LM-circ stent  Mild global LV hypokinesis, EF 50%  Mild to moderate AS, mean gradient 23   PLAN: CABG/LIMA LAD versus PCI will be discussed.  Will follow valve  lesions (AS, MR) clinically.   Coronary Findings Diagnostic Dominance: Right  Left Main: Previously placed Ost LM to Dist LM drug eluting stent is widely patent. Not the culprit lesion. TIMI flow is 3. Stent delivery was done by way of balloon expansion. iFR was measured. iFR ratio: 0.9. Pressure wire/FFR was not performed on the lesion. IVUS was not performed. CFVR was not performed. No optical coherence tomography (OCT) was performed.  Left Anterior Descending: Ost LAD lesion is 80% stenosed. Culprit lesion. Lesion length: 4 mm.  TIMI flow is 3. The lesion is type C, located at the bifurcation, discrete and eccentric. The lesion is not chronically  occluded. The lesion was not previously treated.  Left Circumflex: Previously placed Ost Cx to Prox Cx drug eluting stent is widely patent. Not the culprit lesion. TIMI flow is 3. Stent delivery was done by way of balloon expansion. The lesion has no restenosis. No thrombosis in the previous stent. The stenosis was measured by a visual reading. iFR was measured. Pressure wire/FFR was not performed on the lesion. IVUS was not performed. CFVR was not performed. No optical coherence tomography (OCT) was performed.  Right Coronary Artery: Prox RCA to Mid RCA lesion is 40% stenosed. Not the culprit lesion. Lesion length: 6 mm. TIMI flow is 3. The lesion is not complex (non high-C), concentric and discrete. The lesion is not chronically occluded. The lesion was not previously treated. The stenosis was measured by a visual reading.    PCI 06/25/19 Successful DES of ostial LAD stenosis, Culotte technique with new stent  covering target ostial LAD stenosis back into LMCA, after predilating  existing LM-circ Stent. Optimized LM trunk with 66mm inflation.  IVUS  guided intervention with no visible stent expansion or opposition issues.     PLAN: continue DAPT. Will confirm adequate plavix p2y12 inhibition. For  restenosis of LM bifurcation, CABG will be required.  Coronary Findings Diagnostic Dominance: Right  Left Anterior Descending: Ost LAD to Prox LAD lesion is 80% stenosed. Culprit lesion. Lesion length: 5 mm. TIMI flow is 3. The lesion is type C, located at the bifurcation, discrete and eccentric. The Lucius Conn classification is 0,1,0 - main branch distal. The lesion is not chronically occluded. The lesion was not previously treated. The stenosis was measured by a visual reading. iFR was measured. iFR ratio: 0.7.   Intervention  Ost LAD to Prox LAD lesion: Stent: Drug-eluting stent was  successfully placed. Post-Intervention Lesion Assessment: The intervention was successful. Intentional subintimal strategy was not used. Embolic protection device was not deployed. The guidewire was not threaded through a graft to reach the lesion. The guidewire crossed the lesion. Device was deployed. Post-intervention TIMI flow is 3. Lesion had 15 mm of its length treated. There were no complications. Pressure wire/FFR was not measured. Ultrasound (IVUS) was performed. Moderate plaque burden was detected. IVUS has determined that the lesion is fibrinous. There is a 0% residual stenosis post intervention  Echo 06/24/19 SUMMARY  Left ventricular systolic function is mildly reduced.  LV ejection fraction = 45-50%.  There is mild global hypokinesis of the left ventricle.  Left ventricular filling pattern is pseudonormal.  The right ventricle is normal in size and function.  There is mild to moderate aortic stenosis.  There is mild aortic regurgitation.  There is mild to moderate mitral regurgitation.  There is no comparison study available.     Laboratory Data:  High Sensitivity Troponin:   Recent Labs  Lab 03/28/20 0739 03/28/20 0933  TROPONINIHS 5 6     Chemistry Recent Labs  Lab 03/28/20 0739 03/29/20 0341  NA 140 141  K 3.9 3.8  CL 106 106  CO2 24 25  GLUCOSE 150* 124*  BUN 18 14  CREATININE 0.72 0.68  CALCIUM 8.8* 8.5*  GFRNONAA >60 >60  ANIONGAP 10 10    Recent Labs  Lab 03/29/20 0341  PROT 5.9*  ALBUMIN 3.0*  AST 12*  ALT 12  ALKPHOS 77  BILITOT 0.6   Hematology Recent Labs  Lab 03/28/20 0739 03/28/20 2000 03/29/20 0341  WBC 8.1 8.8 7.8  RBC 3.08* 3.20* 3.19*  HGB 7.6* 8.0* 8.3*  HCT  26.6* 27.9* 26.7*  MCV 86.4 87.2 83.7  MCH 24.7* 25.0* 26.0  MCHC 28.6* 28.7* 31.1  RDW 18.5* 17.8* 18.0*  PLT 273 240 258   BNPNo results for input(s): BNP, PROBNP in the last 168 hours.  DDimer No results for input(s): DDIMER in the last 168  hours.   Radiology/Studies:  DG Chest 2 View  Result Date: 03/28/2020 CLINICAL DATA:  Chest pain 2 weeks EXAM: CHEST - 2 VIEW COMPARISON:  03/24/2020 and prior. FINDINGS: Cardiomegaly and central pulmonary vascular congestion. Patchy bibasilar opacities. No pneumothorax or pleural effusion. No acute osseous abnormality. IMPRESSION: Patchy basilar opacities may reflect atelectasis, edema or infection. Cardiomegaly and central pulmonary vascular congestion. Electronically Signed   By: Stana Bunting M.D.   On: 03/28/2020 08:00     Assessment and Plan:   Symptomatic Anemia - Hgb 7.6 on admission which was unchanged from the week prior. Imaging during prior admission showed no active GIB.  - s/p 1 unit PRBCs - GI consulted>>not planning intervention - Hgb today 8.3. Pt says she is feeling a little better but stil having chest pain - Hgb 03/12/20 9.1. Unclear baseline - Home Iron continued  Chest pain - In the setting of symptomatic anemia and known CAD with prior stenting - HS trop negative x 2 - EKG with no ischemic changes - continue aspirin and plavix - amlodipine held for soft Bps - Suspect symptoms exacerbated given anemia. Given she has history of CAD with stenting might need re-look cath however will defer to MD. Would optimize medical management.  Chronic systolic heart failure - home lasix continued - congestion on CXR - Echo 05/2019 showed EF 45-50% - echo ordered - continue BB, ACE  HLD - continue atorvastatin 80 mg daily - no recent LDL  HTN - Toprol 25mg  daily, lisinopril 2.5mg  daily - Bps well controlled  Valvular disease - Echo from 05/2019 showed mild to mod AS with a mean gradient of 07/2019 and mild to mod MR. Plan was to assess in a year since it was felt symptoms were not from this. - Murmur on exam - Echo ordered to further evaluate. Patient might need further evaluation of Mitral valve.     HEAR Score (for undifferentiated chest pain):  HEAR  Score: 4    For questions or updates, please contact [facility] Please consult [email or web address] for contact info under    Signed, Cadence , PA-C  03/29/2020 1:35 PM   History and all data above reviewed.  Patient examined.  I agree with the findings as above.  Patient presents with chest discomfort.  She said that she has had chest comfort with multiple interventions as above.  She cannot remember the details of this discomfort per se but said that it did go away last time she had a PCI in March of this year.  However, it has been coming back for several weeks.  She had a GI work-up and they have prescribed this to nonanginal chest pain.  However, she says is been getting worse in the last few days was at its peak.  He has been told that she does not do anything nitroglycerin but she did prior to coming in because of this discomfort.  She states she came in because it was happening with minimal exertion.  She was having some discomfort at rest.  It is a midsternal squeezing discomfort.  Is 10 out of 10.  There is no jaw or arm discomfort.  There is no radiation  otherwise.  There is no shortness of breath, PND or orthopnea.  She is not having any palpitations, presyncope or syncope.  The patient exam reveals COR:RRR, no rubs, 3 out of 6 apical systolic murmur radiating up the aortic outflow tract, 2 out of 6 axillary systolic murmur, no diastolic,  Lungs: Clear without wheezing or crackles,  Abd: Positive bowel sounds no rebound or guarding, Ext 2+ pulses, no edema.  All available labs, radiology testing, previous records reviewed. Agree with documented assessment and plan.    Chest pain: This is concerning for unstable angina.  Cardiac cath patient is indicated.  The patient understands that risks included but are not limited to stroke (1 in 1000), death (1 in 1000), kidney failure [usually temporary] (1 in 500), bleeding (1 in 200), allergic reaction [possibly serious] (1 in 200).  The patient  understands and agrees to proceed.   She also could have up titration of her medications.  She was on higher dose beta-blocker until recently when her blood pressure was low but this was reduced so we likely cannot increase this much above the current dose.  However, it does not sound like she has ever been on long-acting nitrates or Ranexa.  This could certainly be considered.  MR: This appears to be moderate by my review of the echo and I am waiting for final result.  She may ultimately need TEE.  AS: This seems to be mild clinically but I will wait for the measurements recorded on the echocardiogram.  Anemia: She has been evaluated by GI and no further work-up is suggested.  For now I would continue aspirin and Plavix.  Fayrene Fearing Rasaan Brotherton  5:43 PM  03/29/2020

## 2020-03-29 NOTE — Progress Notes (Signed)
Subjective:   Patient states she feels okay this morning. Reports some dyspnea and chest pain when she got up to the bathroom, which then resolved with rest. No CP at rest during this admission. She reports an episode of CP on Monday night which woke her from sleep, which improved with nitroglycerin. States this has happened before, and she usually sits up in a chair and it resolves over 15-20 minutes. Has also noticed the pain occur when carrying in groceries and showering. It always resolves within 15-20 minutes. Describes the pain as substernal pressure, and like a burning sensation. Sometimes also feels as if something is stuck in her throat, though she has not eaten. Not otherwise associated with eating. Her cardiologist is Dr. Garner Nash in [facility]. Decided to come to [facility] due to frustration in not receiving workup at [facility].   Objective:  Vital signs in last 24 hours: Vitals:   03/29/20 0015 03/29/20 0356 03/29/20 0716 03/29/20 1154  BP: 132/71 92/65 126/75 126/73  Pulse: 86 83 73 73  Resp: 16 18 20 20   Temp: 98.5 F (36.9 C) 98.6 F (37 C) 98.4 F (36.9 C) 98.7 F (37.1 C)  TempSrc: Oral Oral Oral Oral  SpO2: 100% 97% 99% 99%  Weight: 101.4 kg     Height: 5' (1.524 m)      General: Patient is mobidly obese. Otherwise appears well in no acute distress. Respiratory: Lungs are CTA, bilaterally. No wheezes, rales, or rhonchi.  Cardiovascular: Regular rate and rhythm. 2/6 SEM heard throughout chest, loudest at right upper sternal border. No lower extremity edema. Mild JVD.  Abdominal: Obese. Soft with moderate RUQ tenderness and guarding to palpation which resolves with distraction. Bowel sounds intact. No rebound. Psych: Normal affect. Normal tone of voice.   Assessment/Plan:  Active Problems:   Symptomatic anemia   Orders: changed CBG to with meals and nighttime, echo ordered  Patient requesting booster prior to d/c  # Typical CP Most Likely 2/2 Unstable Angina   Patient endorses central chest pressure / burning that has been increasing in frequency recently, usually with exertion although now occurring at rest as well. Troponin 5 > 6, EKG without findings concerning for ischemia. [place], found to be significantly anemia, although with CP at that time. States usually her CP improves more significantly after transfusion, although received a transfusion overnight without significant improvement. Has had 5 cardiac catheterizations since June 2020, and most recently had DES placed in the L main, Ostial Circumflex, and Ostial LAD 05/2019, which were deemed higher risk for stent failure. Has other risk factors including morbid obesity, HLD, Type II DM. Also consider possibility CP may be due to worsening aortic stenosis, symptomatic anemia, although less likely given less improvement than usual following RBC transfusion vs. Due to esophageal spasm or other GI etiology. [facility] for further recommendations - Continue DAPT with ASA and Plavix (until 05/2020)  - Continue home lipitor  - Continue Pepcid 20mg  daily   # Acute on Chronic IDA likely 2/2 UGIB, Stable  Hemoglobin improved from 7.6 on admission to 8.3 this morning s/p single RBC transfusion. Iron studies showed severe IDA. Patient takes iron supplementation chronically at home. 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 NM bleeding scan 03/23/20 did not show any acute bleed. Patient denies any current blood in her stool although  notes stools continue to be dark, which she attributes to iron. GI consulted who have scheduled outpatient follow up with Dr. Chales Abrahams 05/03/19 (or sooner if cancellation) for likely referral to tertiary care GI center for push endoscopy.  - GI consulted and have signed off; appreciate their recommendations -  Continue to monitor for signs of GI bleeding - Give IV Feraheme - Continue PO iron supplementation  - Continue daily CBC monitoring  - Transfuse RBC's if Hgb < 8.0    # HFrEF  # Aortic Stenosis  Most recent ECHO 05/2019 showed EF 45-50% with global hypokinesis, mild MR and mild-moderate AS. Patient takes Lasix 40mg  daily at home, which has been continued this admission as she did have some vascular congestion on CXR and mild JVD.  - Continue Lasix 40mg  PO daily for now - Strict I&O - Check daily weights  # Hypertension Blood pressures stable on home Metoprolol succinate and lisinopril w/ holding parameters.  - Will continue to hold amlodipine for now given soft pressures this morning w/ hx AS  - Continue to monitor   # Asthma # COPD # OSA Patient refused CPAP overnight, although reports that she has a CPAP she uses at home. Currently maintaining good oxygen saturations on room air without increased work of breathing.  - Encourage CPAP at night - Continue home medications  # IDDM Type II Patient takes 24 units Lantus twice daily at home. CBG's remain under good control on Insulin 15 units nightly + Moderate SSI.  - Continue current insulin regimen  - Switch CBG monitoring to TID WC + qHS   Prior to Admission Living Arrangement: Home Anticipated Discharge Location: Home  Barriers to Discharge: Unstable Angina workup  Dispo: Anticipated discharge in approximately 1-2 days.  Code Status: Full Code Diet: HH/Carb Modified  DVT PPx: SCD's 2/2 GI bleeding  IVF: none  , MD 03/29/2020, 2:35 PM Pager: [phone] After 5pm on weekdays and 1pm on weekends: On Call pager [phone]

## 2020-03-29 NOTE — Progress Notes (Signed)
Patient arrived to unit, ambulated to bathroom independently. Patient became [facility] and had mild CP during ambulation. CP and SHOB quickly resolved when patient sat down. VSS will continue to monitor.

## 2020-03-29 NOTE — Consult Note (Addendum)
Cardiology Consultation:   Patient ID: Heidi Morse MRN: [id number]; DOB: 1955-11-22  Admit date: 03/28/2020 Date of Consult: 03/29/2020  Primary Care Provider: Arlyn DunningEverly, Rebecca B, DO [facility] Cardiologist: [facility] Electrophysiologist:  None    Patient Profile:   Heidi Morse is a 64 y.o. female with a hx of bilateral carotid artery stenosis s/p stent placement, chronic systolic CHF, CAD s/p stenting to left main, Ost Cx and Ost LAD, HTN, ischemic CM, nonrheumatic arotic valve stenosis, DM2, PAD s/p stenting of the left common iliac artery, chronic pain and obesity who is being seen today for the evaluation of chest pain and CHF at the request of Dr. Sandre Kittyaines.  History of Present Illness:   Heidi Morse has been seen by cardiology at outside hospitals. Chart review says between 2012 and June 2020 the patient had 5 trips to the cath lab to evaluated for chest pain all of which showed nonobstructive CAD. From June 2020 to March 2021 the patient had 5 additional trips to the cath lab and has been treated with stenting to the left main, ostial Cx, and ostial LAD. She has been seen by cardiothoracic surgery on 2 occasions and it was felt CAD could be better treated with PCI. She has chronic chest pain which some is felt to be noncardiac. She is on aspirin, plavix, metoprolol, amlodipine, and atorvastatin. She also has a history of systolic heart failure. Echo from 05/2019 showed EF 45-50% with global hypokinesis, presumed to be ischemic. She takes lisinopril, BB, and lasix 40 mg daily. Echo from 05/2019 showed mild to mod AS with a mean gradient of 18mmHg and mild MR. Plan to assess in a year.   [facility] and recent GIB. Hgb was down to 7.6, she did not require transfusion. She had Chest pain and troponins were negative. EKG was non-ischemic. Imaging was negative for active bleed. She was discharged on plavix and aspirin.   [facility] by Nitro. It was felt to be not cardiac in origin. EKG was stable. She was discharged home. Family was frustrated [facility]   She reports chest pain that has been occurring for the last 4 months. She knew she had low hemoglobin so thought it was due to that. However chest discomfort has nit improved. It is worse with any type of exertion. It is substernal and non-radiating. She denies N/V or diaphoresis. She took a Nitro 2 days ago which improved the pain. She occasionally gets sob. No recent fever or chills.   In the ED BP 118/62, pulse 98, RR 18. Hgb 7.6. Troponin 5>6. EKG showed SR with nonspecific T wave changes. CXR showed cardiomegaly and central pulmonary vascular congestion.  Patient was given IVF. GI consulted. She was admitted for further work-up.   Past Medical History:  Diagnosis Date  . Asthma   . CHF (congestive heart failure) (HCC)   . COPD (chronic obstructive pulmonary disease) (HCC)   . Diabetes mellitus without complication (HCC)   . Hypertension   . MI (myocardial infarction) (HCC)   . Sleep apnea     History reviewed. No pertinent surgical history.   Home Medications:  Prior to Admission medications   Medication Sig Start Date End Date Taking? Authorizing Provider  albuterol (VENTOLIN HFA) 108 (90 Base) MCG/ACT inhaler Inhale 2 puffs into the lungs every 6 (six) hours as needed for wheezing. 03/09/20  Yes [provider]  amLODipine (NORVASC) 2.5 MG tablet Take 2.5 mg by mouth daily. 03/11/20  Yes [provider]  aspirin 81 MG EC tablet Take 81 mg by mouth daily. 08/18/18  Yes [provider]  atorvastatin (LIPITOR) 80 MG tablet Take 80 mg by mouth daily. 03/09/20  Yes [provider]  Cholecalciferol 25 MCG (1000 UT) tablet Take 2,000 Units by mouth daily. 02/08/20 05/08/20 Yes [provider]  clopidogrel (PLAVIX) 75 MG tablet Take 75 mg by mouth daily.  02/29/20  Yes [provider]  famotidine (PEPCID) 20 MG tablet Take 20 mg by mouth daily. 12/14/19  Yes [provider]  ferrous sulfate 325 (65 FE) MG tablet Take 1 tablet by mouth 2 (two) times daily. 03/24/20  Yes [provider]  Fluticasone-Salmeterol (ADVAIR) 250-50 MCG/DOSE AEPB Inhale 1 puff into the lungs in the morning and at bedtime. 01/27/20  Yes [provider]  furosemide (LASIX) 40 MG tablet Take 40 mg by mouth daily. 03/06/20  Yes [provider]  gabapentin (NEURONTIN) 300 MG capsule Take 300 mg by mouth in the morning, at noon, in the evening, and at bedtime. 02/01/20  Yes [provider]  HYDROcodone-acetaminophen (NORCO) 10-325 MG tablet Take 1 tablet by mouth every 12 (twelve) hours. 03/05/20  Yes [provider]  insulin glargine (LANTUS) 100 UNIT/ML injection Inject 24 Units into the skin 2 (two) times daily as needed (for elevated blood over 150). 11/23/19  Yes [provider]  lisinopril (ZESTRIL) 2.5 MG tablet Take 2.5 mg by mouth daily. 03/24/20  Yes [provider]  metoprolol succinate (TOPROL-XL) 25 MG 24 hr tablet Take 25 mg by mouth daily. 03/24/20  Yes [provider]  montelukast (SINGULAIR) 10 MG tablet Take 10 mg by mouth daily. 01/05/20  Yes [provider]  nitroGLYCERIN (NITROSTAT) 0.4 MG SL tablet Place 0.4 mg under the tongue every 5 (five) minutes as needed for chest pain. 02/08/20 02/07/21 Yes [provider]  valACYclovir (VALTREX) 500 MG tablet Take 500 mg by mouth as needed (for flare ups).   Yes [provider]    Inpatient Medications: Scheduled Meds: . sodium chloride   Intravenous Once  . aspirin EC  81 mg Oral Daily  . atorvastatin  80 mg Oral Daily  . cholecalciferol  2,000 Units Oral Daily  . clopidogrel  75 mg Oral Daily  . famotidine  20 mg Oral Daily  . ferrous sulfate  325 mg Oral BID  . furosemide  40 mg Oral Daily  . insulin  aspart  0-9 Units Subcutaneous Q4H  . insulin glargine  15 Units Subcutaneous QHS  . lisinopril  2.5 mg Oral Daily  . metoprolol succinate  25 mg Oral Daily  . mometasone-formoterol  2 puff Inhalation BID  . montelukast  10 mg Oral Daily  . pantoprazole (PROTONIX) IV  40 mg Intravenous Q24H   Continuous Infusions:  PRN Meds: acetaminophen **OR** acetaminophen, albuterol, ondansetron **OR** ondansetron (ZOFRAN) IV  Allergies:    Allergies  Allergen Reactions  . Iodine Other (See Comments)    unk reaction per pt  . Peanut-Containing Drug Products Hives and Swelling  . Statins Other (See Comments)    myalgia    Social History:   Social History   Socioeconomic History  . Marital status: Single    Spouse name: Not on file  . Number of children: Not on file  . Years of education: Not on file  . Highest  education level: Not on file  Occupational History  . Not on file  Tobacco Use  . Smoking status: Not on file  . Smokeless tobacco: Not on file  Substance and Sexual Activity  . Alcohol use: Not on file  . Drug use: Not on file  . Sexual activity: Not on file  Other Topics Concern  . Not on file  Social History Narrative  . Not on file   Social Determinants of Health   Financial Resource Strain: Not on file  Food Insecurity: Not on file  Transportation Needs: Not on file  Physical Activity: Not on file  Stress: Not on file  Social Connections: Not on file  Intimate Partner Violence: Not on file    Family History:   No family history on file.   ROS:  Please see the history of present illness.  All other ROS reviewed and negative.     Physical Exam/Data:   Vitals:   03/29/20 0015 03/29/20 0356 03/29/20 0716 03/29/20 1154  BP: 132/71 92/65 126/75 126/73  Pulse: 86 83 73 73  Resp: Temp: 98.5 F (36.9 C) 98.6 F (37 C) 98.4 F (36.9 C) 98.7 F (37.1 C)  TempSrc: Oral Oral Oral Oral  SpO2: 100% 97% 99% 99%  Weight: 101.4 kg     Height: 5'  (1.524 m)       Intake/Output Summary (Last 24 hours) at 03/29/2020 1335 Last data filed at 03/29/2020 0804 Gross per 24 hour  Intake 1980 ml  Output 400 ml  Net 1580 ml   Last 3 Weights 03/29/2020 03/28/2020  Weight (lbs) 223 lb 9.6 oz 234 lb  Weight (kg) 101.424 kg 106.142 kg     Body mass index is 43.67 kg/m.  General:  Well nourished, well developed, in no acute distress HEENT: normal Lymph: no adenopathy Neck: no JVD Endocrine:  No thryomegaly Vascular: No carotid bruits; FA pulses 2+ bilaterally without bruits  Cardiac:  normal S1, S2; RRR; + murmur  Lungs:  clear to auscultation bilaterally, no wheezing, rhonchi or rales  Abd: soft, nontender, no hepatomegaly  Ext: no edema Musculoskeletal:  No deformities, BUE and BLE strength normal and equal Skin: warm and dry  Neuro:  CNs 2-12 intact, no focal abnormalities noted Psych:  Normal affect   EKG:  The EKG was personally reviewed and demonstrates:  NSR, 81 bpm, nonspecific T wave changes Telemetry:  Telemetry was personally reviewed and demonstrates:  ST, HE 70s  Relevant CV Studies:  Echo ordered  Cardiac Cath 06/25/2019 This result has an attachment that is not available.  Severe ostial LAD stenosis, iFR .70  Widely patent LM-circ stent  Mild global LV hypokinesis, EF 50%  Mild to moderate AS, mean gradient 23   PLAN: CABG/LIMA LAD versus PCI will be discussed.  Will follow valve  lesions (AS, MR) clinically.   Coronary Findings Diagnostic Dominance: Right  Left Main: Previously placed Ost LM to Dist LM drug eluting stent is widely patent. Not the culprit lesion. TIMI flow is 3. Stent delivery was done by way of balloon expansion. iFR was measured. iFR ratio: 0.9. Pressure wire/FFR was not performed on the lesion. IVUS was not performed. CFVR was not performed. No optical coherence tomography (OCT) was performed.  Left Anterior Descending: Ost LAD lesion is 80% stenosed. Culprit lesion. Lesion length: 4 mm.  TIMI flow is 3. The lesion is type C, located at the bifurcation, discrete and eccentric. The lesion is not chronically  occluded. The lesion was not previously treated.  Left Circumflex: Previously placed Ost Cx to Prox Cx drug eluting stent is widely patent. Not the culprit lesion. TIMI flow is 3. Stent delivery was done by way of balloon expansion. The lesion has no restenosis. No thrombosis in the previous stent. The stenosis was measured by a visual reading. iFR was measured. Pressure wire/FFR was not performed on the lesion. IVUS was not performed. CFVR was not performed. No optical coherence tomography (OCT) was performed.  Right Coronary Artery: Prox RCA to Mid RCA lesion is 40% stenosed. Not the culprit lesion. Lesion length: 6 mm. TIMI flow is 3. The lesion is not complex (non high-C), concentric and discrete. The lesion is not chronically occluded. The lesion was not previously treated. The stenosis was measured by a visual reading.    PCI 06/25/19 Successful DES of ostial LAD stenosis, Culotte technique with new stent  covering target ostial LAD stenosis back into LMCA, after predilating  existing LM-circ Stent. Optimized LM trunk with 66mm inflation.  IVUS  guided intervention with no visible stent expansion or opposition issues.     PLAN: continue DAPT. Will confirm adequate plavix p2y12 inhibition. For  restenosis of LM bifurcation, CABG will be required.  Coronary Findings Diagnostic Dominance: Right  Left Anterior Descending: Ost LAD to Prox LAD lesion is 80% stenosed. Culprit lesion. Lesion length: 5 mm. TIMI flow is 3. The lesion is type C, located at the bifurcation, discrete and eccentric. The Lucius Conn classification is 0,1,0 - main branch distal. The lesion is not chronically occluded. The lesion was not previously treated. The stenosis was measured by a visual reading. iFR was measured. iFR ratio: 0.7.   Intervention  Ost LAD to Prox LAD lesion: Stent: Drug-eluting stent was  successfully placed. Post-Intervention Lesion Assessment: The intervention was successful. Intentional subintimal strategy was not used. Embolic protection device was not deployed. The guidewire was not threaded through a graft to reach the lesion. The guidewire crossed the lesion. Device was deployed. Post-intervention TIMI flow is 3. Lesion had 15 mm of its length treated. There were no complications. Pressure wire/FFR was not measured. Ultrasound (IVUS) was performed. Moderate plaque burden was detected. IVUS has determined that the lesion is fibrinous. There is a 0% residual stenosis post intervention  Echo 06/24/19 SUMMARY  Left ventricular systolic function is mildly reduced.  LV ejection fraction = 45-50%.  There is mild global hypokinesis of the left ventricle.  Left ventricular filling pattern is pseudonormal.  The right ventricle is normal in size and function.  There is mild to moderate aortic stenosis.  There is mild aortic regurgitation.  There is mild to moderate mitral regurgitation.  There is no comparison study available.     Laboratory Data:  High Sensitivity Troponin:   Recent Labs  Lab 03/28/20 0739 03/28/20 0933  TROPONINIHS 5 6     Chemistry Recent Labs  Lab 03/28/20 0739 03/29/20 0341  NA 140 141  K 3.9 3.8  CL 106 106  CO2 24 25  GLUCOSE 150* 124*  BUN 18 14  CREATININE 0.72 0.68  CALCIUM 8.8* 8.5*  GFRNONAA >60 >60  ANIONGAP 10 10    Recent Labs  Lab 03/29/20 0341  PROT 5.9*  ALBUMIN 3.0*  AST 12*  ALT 12  ALKPHOS 77  BILITOT 0.6   Hematology Recent Labs  Lab 03/28/20 0739 03/28/20 2000 03/29/20 0341  WBC 8.1 8.8 7.8  RBC 3.08* 3.20* 3.19*  HGB 7.6* 8.0* 8.3*  HCT  26.6* 27.9* 26.7*  MCV 86.4 87.2 83.7  MCH 24.7* 25.0* 26.0  MCHC 28.6* 28.7* 31.1  RDW 18.5* 17.8* 18.0*  PLT 273 240 258   BNPNo results for input(s): BNP, PROBNP in the last 168 hours.  DDimer No results for input(s): DDIMER in the last 168  hours.   Radiology/Studies:  DG Chest 2 View  Result Date: 03/28/2020 CLINICAL DATA:  Chest pain 2 weeks EXAM: CHEST - 2 VIEW COMPARISON:  03/24/2020 and prior. FINDINGS: Cardiomegaly and central pulmonary vascular congestion. Patchy bibasilar opacities. No pneumothorax or pleural effusion. No acute osseous abnormality. IMPRESSION: Patchy basilar opacities may reflect atelectasis, edema or infection. Cardiomegaly and central pulmonary vascular congestion. Electronically Signed   By: Stana Bunting M.D.   On: 03/28/2020 08:00     Assessment and Plan:   Symptomatic Anemia - Hgb 7.6 on admission which was unchanged from the week prior. Imaging during prior admission showed no active GIB.  - s/p 1 unit PRBCs - GI consulted>>not planning intervention - Hgb today 8.3. Pt says she is feeling a little better but stil having chest pain - Hgb 03/12/20 9.1. Unclear baseline - Home Iron continued  Chest pain - In the setting of symptomatic anemia and known CAD with prior stenting - HS trop negative x 2 - EKG with no ischemic changes - continue aspirin and plavix - amlodipine held for soft Bps - Suspect symptoms exacerbated given anemia. Given she has history of CAD with stenting might need re-look cath however will defer to MD. Would optimize medical management.  Chronic systolic heart failure - home lasix continued - congestion on CXR - Echo 05/2019 showed EF 45-50% - echo ordered - continue BB, ACE  HLD - continue atorvastatin 80 mg daily - no recent LDL  HTN - Toprol 25mg  daily, lisinopril 2.5mg  daily - Bps well controlled  Valvular disease - Echo from 05/2019 showed mild to mod AS with a mean gradient of 07/2019 and mild to mod MR. Plan was to assess in a year since it was felt symptoms were not from this. - Murmur on exam - Echo ordered to further evaluate. Patient might need further evaluation of Mitral valve.     HEAR Score (for undifferentiated chest pain):  HEAR  Score: 4    For questions or updates, please contact [facility] Please consult [email or web address] for contact info under    Signed, Cadence , PA-C  03/29/2020 1:35 PM   History and all data above reviewed.  Patient examined.  I agree with the findings as above.  Patient presents with chest discomfort.  She said that she has had chest comfort with multiple interventions as above.  She cannot remember the details of this discomfort per se but said that it did go away last time she had a PCI in March of this year.  However, it has been coming back for several weeks.  She had a GI work-up and they have prescribed this to nonanginal chest pain.  However, she says is been getting worse in the last few days was at its peak.  He has been told that she does not do anything nitroglycerin but she did prior to coming in because of this discomfort.  She states she came in because it was happening with minimal exertion.  She was having some discomfort at rest.  It is a midsternal squeezing discomfort.  Is 10 out of 10.  There is no jaw or arm discomfort.  There is no radiation  otherwise.  There is no shortness of breath, PND or orthopnea.  She is not having any palpitations, presyncope or syncope.  The patient exam reveals COR:RRR, no rubs, 3 out of 6 apical systolic murmur radiating up the aortic outflow tract, 2 out of 6 axillary systolic murmur, no diastolic,  Lungs: Clear without wheezing or crackles,  Abd: Positive bowel sounds no rebound or guarding, Ext 2+ pulses, no edema.  All available labs, radiology testing, previous records reviewed. Agree with documented assessment and plan.    Chest pain: This is concerning for unstable angina.  Cardiac cath patient is indicated.  The patient understands that risks included but are not limited to stroke (1 in 1000), death (1 in 1000), kidney failure [usually temporary] (1 in 500), bleeding (1 in 200), allergic reaction [possibly serious] (1 in 200).  The patient  understands and agrees to proceed.   She also could have up titration of her medications.  She was on higher dose beta-blocker until recently when her blood pressure was low but this was reduced so we likely cannot increase this much above the current dose.  However, it does not sound like she has ever been on long-acting nitrates or Ranexa.  This could certainly be considered.  MR: This appears to be moderate by my review of the echo and I am waiting for final result.  She may ultimately need TEE.  AS: This seems to be mild clinically but I will wait for the measurements recorded on the echocardiogram.  Anemia: She has been evaluated by GI and no further work-up is suggested.  For now I would continue aspirin and Plavix.  Fayrene Fearing Jayln Madeira  5:43 PM  03/29/2020

## 2020-03-29 NOTE — Consult Note (Signed)
   [facility] Inpatient Consult   03/29/2020  Maliyah Willets Feb 18, 1956 [id number]  Managed Medicaid patient  Discussed with inpatient [facility] for post hospital follow up support with Managed Medicaid nurse and possibly social worker.   Plan:  Referral made  Charlesetta Shanks, RN BSN CCM [facility]  [phone] business mobile phone Toll free office [phone]  Fax number: [phone] [email or web address]

## 2020-03-29 NOTE — Progress Notes (Signed)
RT brought CPAP to bedside.  Patient states she does wear one at home but does not want to wear one tonight.  CPAP left at bedside patient instructed to have RN call RT if patient changes her mind.  Patient showed understanding.

## 2020-03-29 NOTE — Plan of Care (Signed)
  Problem: Education: Goal: Knowledge of General Education information will improve Description: Including pain rating scale, medication(s)/side effects and non-pharmacologic comfort measures Outcome: Progressing   Problem: Health Behavior/Discharge Planning: Goal: Ability to manage health-related needs will improve Outcome: Progressing   Problem: Clinical Measurements: Goal: Respiratory complications will improve Outcome: Progressing   

## 2020-03-29 NOTE — Progress Notes (Signed)
  Echocardiogram 2D Echocardiogram has been performed.  Tye Savoy 03/29/2020, 3:49 PM

## 2020-03-29 NOTE — Progress Notes (Signed)
Pt refused cpap

## 2020-03-30 ENCOUNTER — Inpatient Hospital Stay (HOSPITAL_COMMUNITY): Payer: Medicaid Other

## 2020-03-30 ENCOUNTER — Encounter (HOSPITAL_COMMUNITY): Payer: Self-pay | Admitting: Cardiovascular Disease

## 2020-03-30 ENCOUNTER — Encounter (HOSPITAL_COMMUNITY)
Admission: EM | Disposition: A | Discharge status: Home Health | Payer: Self-pay | Source: Home / Self Care | Attending: Thoracic Surgery (Cardiothoracic Vascular Surgery)

## 2020-03-30 ENCOUNTER — Other Ambulatory Visit: Payer: Self-pay

## 2020-03-30 DIAGNOSIS — J45909 Unspecified asthma, uncomplicated: Secondary | ICD-10-CM | POA: Diagnosis not present

## 2020-03-30 DIAGNOSIS — I350 Nonrheumatic aortic (valve) stenosis: Secondary | ICD-10-CM | POA: Diagnosis not present

## 2020-03-30 DIAGNOSIS — I25110 Atherosclerotic heart disease of native coronary artery with unstable angina pectoris: Principal | ICD-10-CM

## 2020-03-30 DIAGNOSIS — I200 Unstable angina: Secondary | ICD-10-CM | POA: Diagnosis not present

## 2020-03-30 DIAGNOSIS — J449 Chronic obstructive pulmonary disease, unspecified: Secondary | ICD-10-CM | POA: Diagnosis not present

## 2020-03-30 DIAGNOSIS — D649 Anemia, unspecified: Secondary | ICD-10-CM | POA: Diagnosis not present

## 2020-03-30 DIAGNOSIS — I10 Essential (primary) hypertension: Secondary | ICD-10-CM | POA: Diagnosis not present

## 2020-03-30 DIAGNOSIS — R079 Chest pain, unspecified: Secondary | ICD-10-CM | POA: Diagnosis not present

## 2020-03-30 HISTORY — PX: LEFT HEART CATH AND CORONARY ANGIOGRAPHY: CATH118249

## 2020-03-30 LAB — GLUCOSE, CAPILLARY
Glucose-Capillary: 142 mg/dL — ABNORMAL HIGH (ref 70–99)
Glucose-Capillary: 209 mg/dL — ABNORMAL HIGH (ref 70–99)
Glucose-Capillary: 254 mg/dL — ABNORMAL HIGH (ref 70–99)
Glucose-Capillary: 216 mg/dL — ABNORMAL HIGH (ref 70–99)

## 2020-03-30 LAB — CBC
HCT: 29 % — ABNORMAL LOW (ref 36.0–46.0)
Hemoglobin: 8.3 g/dL — ABNORMAL LOW (ref 12.0–15.0)
MCH: 24.6 pg — ABNORMAL LOW (ref 26.0–34.0)
MCHC: 28.6 g/dL — ABNORMAL LOW (ref 30.0–36.0)
MCV: 86.1 fL (ref 80.0–100.0)
Platelets: 259 10*3/uL (ref 150–400)
RBC: 3.37 MIL/uL — ABNORMAL LOW (ref 3.87–5.11)
RDW: 18.1 % — ABNORMAL HIGH (ref 11.5–15.5)
WBC: 8.5 10*3/uL (ref 4.0–10.5)
nRBC: 0 % (ref 0.0–0.2)

## 2020-03-30 LAB — BASIC METABOLIC PANEL
Anion gap: 11 (ref 5–15)
BUN: 17 mg/dL (ref 8–23)
CO2: 24 mmol/L (ref 22–32)
Calcium: 8.9 mg/dL (ref 8.9–10.3)
Chloride: 105 mmol/L (ref 98–111)
Creatinine, Ser: 0.63 mg/dL (ref 0.44–1.00)
GFR, Estimated: >60 mL/min (ref >60)
Glucose, Bld: 127 mg/dL — ABNORMAL HIGH (ref 70–99)
Potassium: 3.8 mmol/L (ref 3.5–5.1)
Sodium: 140 mmol/L (ref 135–145)

## 2020-03-30 LAB — PLATELET INHIBITION P2Y12: Platelet Function  P2Y12: 242 [PRU] (ref 182–335)

## 2020-03-30 SURGERY — LEFT HEART CATH AND CORONARY ANGIOGRAPHY
Anesthesia: LOCAL

## 2020-03-30 MED ORDER — HYDRALAZINE HCL 20 MG/ML IJ SOLN: 10.0000 mg | INTRAMUSCULAR | Status: AC | PRN

## 2020-03-30 MED ORDER — LABETALOL HCL 5 MG/ML IV SOLN: 10.0000 mg | INTRAVENOUS | Status: AC | PRN

## 2020-03-30 MED ORDER — LIDOCAINE HCL (PF) 1 % IJ SOLN
INTRAMUSCULAR | Status: DC | PRN
  Administered 2020-03-30: 2 mL

## 2020-03-30 MED ORDER — LIDOCAINE HCL (PF) 1 % IJ SOLN
INTRAMUSCULAR | Status: AC
  Filled 2020-03-30: qty 30

## 2020-03-30 MED ORDER — SODIUM CHLORIDE 0.9 % IV SOLN: 250.0000 mL | INTRAVENOUS | Status: DC | PRN

## 2020-03-30 MED ORDER — FENTANYL CITRATE (PF) 100 MCG/2ML IJ SOLN
INTRAMUSCULAR | Status: DC | PRN
  Administered 2020-03-30 (×2): 25 ug via INTRAVENOUS

## 2020-03-30 MED ORDER — HEPARIN (PORCINE) IN NACL 1000-0.9 UT/500ML-% IV SOLN
INTRAVENOUS | Status: AC
  Filled 2020-03-30: qty 1000

## 2020-03-30 MED ORDER — VERAPAMIL HCL 2.5 MG/ML IV SOLN
INTRAVENOUS | Status: AC
  Filled 2020-03-30: qty 2

## 2020-03-30 MED ORDER — SODIUM CHLORIDE 0.9 % IV SOLN: INTRAVENOUS | Status: AC

## 2020-03-30 MED ORDER — MIDAZOLAM HCL 2 MG/2ML IJ SOLN
INTRAMUSCULAR | Status: AC
  Filled 2020-03-30: qty 2

## 2020-03-30 MED ORDER — MIDAZOLAM HCL 2 MG/2ML IJ SOLN
INTRAMUSCULAR | Status: DC | PRN
  Administered 2020-03-30 (×2): 1 mg via INTRAVENOUS

## 2020-03-30 MED ORDER — FENTANYL CITRATE (PF) 100 MCG/2ML IJ SOLN
INTRAMUSCULAR | Status: AC
  Filled 2020-03-30: qty 2

## 2020-03-30 MED ORDER — DIPHENHYDRAMINE HCL 50 MG/ML IJ SOLN
25.0000 mg | Freq: Once | INTRAMUSCULAR | Status: AC
  Administered 2020-03-30: 08:00:00 25 mg via INTRAVENOUS
  Filled 2020-03-30: qty 1

## 2020-03-30 MED ORDER — HEPARIN SODIUM (PORCINE) 1000 UNIT/ML IJ SOLN
INTRAMUSCULAR | Status: DC | PRN
  Administered 2020-03-30: 5000 [IU] via INTRAVENOUS

## 2020-03-30 MED ORDER — METHYLPREDNISOLONE SODIUM SUCC 125 MG IJ SOLR
125.0000 mg | Freq: Once | INTRAMUSCULAR | Status: AC
  Administered 2020-03-30: 08:00:00 125 mg via INTRAVENOUS
  Filled 2020-03-30: qty 2

## 2020-03-30 MED ORDER — ISOSORBIDE MONONITRATE ER 60 MG PO TB24: 60.0000 mg | ORAL_TABLET | Freq: Every day | ORAL | Status: DC

## 2020-03-30 MED ORDER — HEPARIN (PORCINE) IN NACL 1000-0.9 UT/500ML-% IV SOLN
INTRAVENOUS | Status: DC | PRN
  Administered 2020-03-30: 500 mL

## 2020-03-30 MED ORDER — INSULIN ASPART 100 UNIT/ML ~~LOC~~ SOLN
0.0000 [IU] | Freq: Three times a day (TID) | SUBCUTANEOUS | Status: DC
  Administered 2020-03-31: 2 [IU] via SUBCUTANEOUS
  Administered 2020-03-31 – 2020-04-02 (×4): 1 [IU] via SUBCUTANEOUS

## 2020-03-30 MED ORDER — INSULIN GLARGINE 100 UNIT/ML ~~LOC~~ SOLN
18.0000 [IU] | Freq: Every day | SUBCUTANEOUS | Status: DC
  Administered 2020-03-30 – 2020-04-02 (×4): 18 [IU] via SUBCUTANEOUS
  Filled 2020-03-30 (×5): qty 0.18

## 2020-03-30 MED ORDER — HEPARIN SODIUM (PORCINE) 1000 UNIT/ML IJ SOLN
INTRAMUSCULAR | Status: AC
  Filled 2020-03-30: qty 1

## 2020-03-30 MED ORDER — SODIUM CHLORIDE 0.9% FLUSH
3.0000 mL | Freq: Two times a day (BID) | INTRAVENOUS | Status: DC
  Administered 2020-03-31 – 2020-04-02 (×4): 3 mL via INTRAVENOUS

## 2020-03-30 MED ORDER — SODIUM CHLORIDE 0.9% FLUSH: 3.0000 mL | INTRAVENOUS | Status: DC | PRN

## 2020-03-30 MED ORDER — VERAPAMIL HCL 2.5 MG/ML IV SOLN
INTRAVENOUS | Status: DC | PRN
  Administered 2020-03-30: 09:00:00 10 mL via INTRA_ARTERIAL

## 2020-03-30 MED ORDER — ISOSORBIDE MONONITRATE ER 60 MG PO TB24
60.0000 mg | ORAL_TABLET | Freq: Every day | ORAL | Status: DC
  Administered 2020-03-30 – 2020-04-02 (×4): 60 mg via ORAL
  Filled 2020-03-30 (×4): qty 1

## 2020-03-30 MED ORDER — HEPARIN (PORCINE) 25000 UT/250ML-% IV SOLN
1300.0000 [IU]/h | INTRAVENOUS | Status: DC
  Administered 2020-03-30: 21:00:00 700 [IU]/h via INTRAVENOUS
  Administered 2020-03-31: 22:00:00 1050 [IU]/h via INTRAVENOUS
  Administered 2020-04-01 – 2020-04-03 (×3): 1300 [IU]/h via INTRAVENOUS
  Filled 2020-03-30 (×6): qty 250

## 2020-03-30 SURGICAL SUPPLY — 11 items
CATH 5FR JL3.5 JR4 ANG PIG MP (CATHETERS) ×1 IMPLANT
DEVICE RAD COMP TR BAND LRG (VASCULAR PRODUCTS) ×1 IMPLANT
GLIDESHEATH SLEND SS 6F .021 (SHEATH) ×1 IMPLANT
GUIDEWIRE INQWIRE 1.5J.035X260 (WIRE) IMPLANT
INQWIRE 1.5J .035X260CM (WIRE) ×2
KIT HEART LEFT (KITS) ×2 IMPLANT
PACK CARDIAC CATHETERIZATION (CUSTOM PROCEDURE TRAY) ×2 IMPLANT
SHEATH PROBE COVER 6X72 (BAG) ×1 IMPLANT
SYR MEDRAD MARK 7 150ML (SYRINGE) ×2 IMPLANT
TRANSDUCER W/STOPCOCK (MISCELLANEOUS) ×2 IMPLANT
TUBING CIL FLEX 10 FLL-RA (TUBING) ×2 IMPLANT

## 2020-03-30 NOTE — Progress Notes (Signed)
Subjective:   Patient reports an episode of chest pain while she was bathing. Denies any chest pain at rest nor swelling. She reports they just changed her IV this morning. She understood that her ECHO showed leaky valves and that she is going for a cath today. She also states she may need to be started on a long acting nitro.   Objective:  Vital signs in last 24 hours: Vitals:   03/29/20 1154 03/29/20 1639 03/29/20 1919 03/30/20 0405  BP: 126/73 138/79 (!) 103/48 (!) 121/59  Pulse: 73 91 80 81  Resp: 20 20 18 18   Temp: 98.7 F (37.1 C) 98.5 F (36.9 C) 98.3 F (36.8 C) 98.9 F (37.2 C)  TempSrc: Oral Oral Oral Oral  SpO2: 99% 100% 96% 91%  Weight:    100.4 kg  Height:       General: Patient is mobidly obese. Otherwise appears well in no acute distress. Respiratory: Lungs are CTA, bilaterally. No wheezes, rales, or rhonchi.  Cardiovascular: Regular rate and rhythm. 2/6 SEM heard throughout chest, loudest at right upper sternal border. No lower extremity edema. Mild JVD.  Abdominal: Obese. Soft with moderate RUQ tenderness and guarding to palpation which resolves with distraction. Bowel sounds intact. No rebound. Psych: Normal affect. Normal tone of voice.   Assessment/Plan:  Principal Problem:   Unstable angina (HCC) Active Problems:   Symptomatic anemia   Aortic stenosis  #Unstable Angina  Patient continues to endorse central chest pain, mostly with exertion. On admission, trops were flat and EKG did not show any ischemic changes. Cath today showed-  Prox RCA to Mid RCA lesion is 40% stenosed  Ost Cx lesion is 99% stenosed  Dist LM to Ost LAD lesion is 60% stenosed  She also has restenosis of the distal left main stented segment, ostial Circumflex stented segment and ostial LAD stented segment. CT surgery consulted for consideration of CABG and possible valve replacement. Plavix is being held.  - Cardiology and CT surgery consulted, appreciate their recommendations -  Hold plavix  - Consider long-acting nitrates or Ranexa  - Likely will be unable to tolerate higher dose beta blocker due to low BP  - Continue home lipitor  - Continue Pepcid 20mg  daily   # Acute on Chronic IDA likely 2/2 UGIB, Stable  Hbg stable at 8.3. S/p 1 unit PRBC on admission. Iron studies showed severe IDA. Patient takes iron supplementation chronically at home. 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 NM bleeding scan 03/23/20 did not show any acute bleed. Patient denies any current blood in her stool although notes stools continue to be dark, which she attributes to iron. GI consulted who have scheduled outpatient follow up with Dr. 03/23/20 05/03/19 (or sooner if cancellation) for likely referral to tertiary care GI center for push endoscopy.  - Continue to monitor for signs of GI bleeding - Give IV Feraheme - Continue PO iron supplementation  - Continue daily CBC monitoring  - Transfuse RBC's if Hgb < 8.0    # Acute on Chronic Combined CHF  # Aortic Stenosis, Moderate  # Mitral Regurgitation, Moderate Most recent ECHO 05/2019 showed EF 45-50% with global hypokinesis, mild MR and mild-moderate AS. ECHO yesterday showed no change in LV function, although mild LVH with grade II diastolic dysfunction and progression of MR and AS (now moderate). Patient takes Lasix 40mg  daily at  home, which has been continued this admission as she did have some vascular congestion on CXR and mild JVD.  - May require TEE this admission; follow cardiology recommendations - Continue Lasix 40mg  PO daily for now - Strict I&O - Check daily weights  # Hx of Hypertension Blood pressures stable on home Metoprolol succinate and lisinopril w/ holding parameters.  - Will continue to hold amlodipine for now given soft pressures this morning w/ hx AS  - Continue to monitor   #  Asthma # COPD # OSA Patient again refused CPAP overnight, although reports that she has a CPAP she uses at home. Currently maintaining good oxygen saturations on room air without increased work of breathing.  - Encourage CPAP at night - Continue home medications  # IDDM Type II Patient takes 24 units Lantus twice daily at home. CBG's remain under good control on Insulin 15 units nightly + Moderate SSI.  - Continue current insulin regimen  - Switch CBG monitoring to TID WC + qHS   Prior to Admission Living Arrangement: Home Anticipated Discharge Location: Home  Barriers to Discharge: Unstable Angina workup  Dispo: Anticipated discharge in approximately 1-2 days.  Code Status: Full Code Diet: HH/Carb Modified  DVT PPx: SCD's 2/2 GI bleeding  IVF: none  , MD 03/30/2020, 7:06 AM Pager: [phone] After 5pm on weekdays and 1pm on weekends: On Call pager [phone]

## 2020-03-30 NOTE — Progress Notes (Signed)
Patient complaining of nausea and headache. PRN tylenol and zofran offered. Pt refused and stated she "does not want to take any more meds". Pt given crackers and diet ginger ale.

## 2020-03-30 NOTE — Progress Notes (Signed)
Pt refused CPAP qhs.  Pt encouraged to contact RT should she change her mind.  

## 2020-03-30 NOTE — Progress Notes (Signed)
Progress Note  Patient Name: Heidi Morse Date of Encounter: 03/30/2020  Primary Cardiologist:   No primary care provider on file.   Subjective   No chest pain.  No SOB.   Inpatient Medications    Scheduled Meds: . sodium chloride   Intravenous Once  . aspirin EC  81 mg Oral Daily  . atorvastatin  80 mg Oral Daily  . cholecalciferol  2,000 Units Oral Daily  . clopidogrel  75 mg Oral Daily  . famotidine  20 mg Oral Daily  . ferrous sulfate  325 mg Oral BID  . furosemide  40 mg Oral Daily  . insulin aspart  0-9 Units Subcutaneous Q4H  . insulin glargine  15 Units Subcutaneous QHS  . lisinopril  2.5 mg Oral Daily  . metoprolol succinate  25 mg Oral Daily  . mometasone-formoterol  2 puff Inhalation BID  . montelukast  10 mg Oral Daily  . pantoprazole (PROTONIX) IV  40 mg Intravenous Q24H  . sodium chloride flush  3 mL Intravenous Q12H   Continuous Infusions: . sodium chloride    . sodium chloride 1 mL/kg/hr (03/30/20 0727)   PRN Meds: sodium chloride, acetaminophen **OR** acetaminophen, albuterol, ondansetron **OR** ondansetron (ZOFRAN) IV, sodium chloride flush   Vital Signs    Vitals:   03/29/20 1639 03/29/20 1919 03/30/20 0405 03/30/20 0816  BP: 138/79 (!) 103/48 (!) 121/59 107/68  Pulse: 91 80 81 79  Resp: 20 18 18 20   Temp: 98.5 F (36.9 C) 98.3 F (36.8 C) 98.9 F (37.2 C)   TempSrc: Oral Oral Oral   SpO2: 100% 96% 91% 98%  Weight:   100.4 kg   Height:        Intake/Output Summary (Last 24 hours) at 03/30/2020 0835 Last data filed at 03/30/2020 0800 Gross per 24 hour  Intake 621.12 ml  Output 300 ml  Net 321.12 ml   Filed Weights   03/28/20 0715 03/29/20 0015 03/30/20 0405  Weight: 106.1 kg 101.4 kg 100.4 kg    Telemetry    NSR - Personally Reviewed  ECG    NA - Personally Reviewed  Physical Exam   GEN: No acute distress.   Neck: No  JVD Cardiac: RRR, no murmurs, rubs, or gallops.  Respiratory: Clear  to auscultation  bilaterally. GI: Soft, nontender, non-distended  MS: No edema; No deformity. Right radial without bruising Neuro:  Nonfocal  Psych: Normal affect   Labs    Chemistry Recent Labs  Lab 03/28/20 0739 03/29/20 0341 03/30/20 0244  NA 140 141 140  K 3.9 3.8 3.8  CL 106 106 105  CO2 24 25 24   GLUCOSE 150* 124* 127*  BUN 18 14 17   CREATININE 0.72 0.68 0.63  CALCIUM 8.8* 8.5* 8.9  PROT  --  5.9*  --   ALBUMIN  --  3.0*  --   AST  --  12*  --   ALT  --  12  --   ALKPHOS  --  77  --   BILITOT  --  0.6  --   GFRNONAA >60 >60 >60  ANIONGAP 10 10 11      Hematology Recent Labs  Lab 03/28/20 2000 03/29/20 0341 03/30/20 0244  WBC 8.8 7.8 8.5  RBC 3.20* 3.19* 3.37*  HGB 8.0* 8.3* 8.3*  HCT 27.9* 26.7* 29.0*  MCV 87.2 83.7 86.1  MCH 25.0* 26.0 24.6*  MCHC 28.7* 31.1 28.6*  RDW 17.8* 18.0* 18.1*  PLT 240 258 259  Cardiac EnzymesNo results for input(s): TROPONINI in the last 168 hours. No results for input(s): TROPIPOC in the last 168 hours.   BNPNo results for input(s): BNP, PROBNP in the last 168 hours.   DDimer No results for input(s): DDIMER in the last 168 hours.   Radiology    ECHOCARDIOGRAM COMPLETE  Result Date: 03/29/2020    ECHOCARDIOGRAM REPORT   Patient Name:   Heidi Morse Date of Exam: 03/29/2020 Medical Rec #:  [id number]      Height:       60.0 in Accession #:    [id number]     Weight:       223.6 lb Date of Birth:  13-Dec-1955      BSA:          1.957 m Patient Age:    64 years       BP:           126/73 mmHg Patient Gender: F              HR:           73 bpm. Exam Location:  Inpatient Procedure: 2D Echo Indications:    Chest Pain  History:        Patient has no prior history of Echocardiogram examinations.                 CHF, Previous Myocardial Infarction, COPD; Risk                 Factors:Hypertension and Diabetes.  Sonographer:    Thurman Coyer RDCS (AE) Referring Phys: [id number] Elwin Mocha RAINES IMPRESSIONS  1. Left ventricular ejection fraction,  by estimation, is 45 to 50%. The left ventricle has mildly decreased function. The left ventricle demonstrates global hypokinesis. There is mild left ventricular hypertrophy. Left ventricular diastolic parameters are consistent with Grade II diastolic dysfunction (pseudonormalization). Elevated left atrial pressure.  2. Right ventricular systolic function is normal. The right ventricular size is normal. There is normal pulmonary artery systolic pressure. The estimated right ventricular systolic pressure is 32.4 mmHg.  3. The mitral valve is abnormal. Moderate mitral valve regurgitation. Moderate mitral annular calcification.  4. The aortic valve is tricuspid. There is moderate thickening of the aortic valve. Aortic valve regurgitation is mild to moderate. Moderate aortic valve stenosis. Vmax 3.4 m/s, MG 24 mmHg, AVA 0.9 cm^2, DI 0.34  5. The inferior vena cava is normal in size with greater than 50% respiratory variability, suggesting right atrial pressure of 3 mmHg. FINDINGS  Left Ventricle: Left ventricular ejection fraction, by estimation, is 45 to 50%. The left ventricle has mildly decreased function. The left ventricle demonstrates global hypokinesis. The left ventricular internal cavity size was normal in size. There is  mild left ventricular hypertrophy. Left ventricular diastolic parameters are consistent with Grade II diastolic dysfunction (pseudonormalization). Elevated left atrial pressure. Right Ventricle: The right ventricular size is normal. No increase in right ventricular wall thickness. Right ventricular systolic function is normal. There is normal pulmonary artery systolic pressure. The tricuspid regurgitant velocity is 2.71 m/s, and  with an assumed right atrial pressure of 3 mmHg, the estimated right ventricular systolic pressure is 32.4 mmHg. Left Atrium: Left atrial size was normal in size. Right Atrium: Right atrial size was normal in size. Pericardium: There is no evidence of pericardial  effusion. Mitral Valve: The mitral valve is abnormal. Moderate mitral annular calcification. Moderate mitral valve regurgitation. Tricuspid Valve: The tricuspid valve is normal in structure. Tricuspid valve regurgitation  is trivial. Aortic Valve: The aortic valve is tricuspid. There is moderate thickening of the aortic valve. Aortic valve regurgitation is mild to moderate. Aortic regurgitation PHT measures 255 msec. Moderate aortic stenosis is present. Aortic valve mean gradient measures 22.5 mmHg. Aortic valve peak gradient measures 44.2 mmHg. Aortic valve area, by VTI measures 0.93 cm. Pulmonic Valve: The pulmonic valve was not well visualized. Pulmonic valve regurgitation is trivial. Aorta: The aortic root and ascending aorta are structurally normal, with no evidence of dilitation. Venous: The inferior vena cava is normal in size with greater than 50% respiratory variability, suggesting right atrial pressure of 3 mmHg. IAS/Shunts: The interatrial septum was not well visualized.  LEFT VENTRICLE PLAX 2D LVIDd:         5.60 cm  Diastology LVIDs:         3.90 cm  LV e' medial:    5.44 cm/s LV PW:         1.10 cm  LV E/e' medial:  25.0 LV IVS:        1.10 cm  LV e' lateral:   4.46 cm/s LVOT diam:     1.80 cm  LV E/e' lateral: 30.5 LV SV:         65 LV SV Index:   33 LVOT Area:     2.54 cm  LEFT ATRIUM             Index       RIGHT ATRIUM           Index LA diam:        4.50 cm 2.30 cm/m  RA Area:     16.10 cm LA Vol (A2C):   83.3 ml 42.56 ml/m RA Volume:   38.70 ml  19.77 ml/m LA Vol (A4C):   21.4 ml 10.93 ml/m LA Biplane Vol: 44.7 ml 22.84 ml/m  AORTIC VALVE AV Area (Vmax):    0.83 cm AV Area (Vmean):   0.94 cm AV Area (VTI):     0.93 cm AV Vmax:           332.50 cm/s AV Vmean:          216.000 cm/s AV VTI:            0.704 m AV Peak Grad:      44.2 mmHg AV Mean Grad:      22.5 mmHg LVOT Vmax:         109.00 cm/s LVOT Vmean:        79.400 cm/s LVOT VTI:          0.256 m LVOT/AV VTI ratio: 0.36 AI PHT:             255 msec  AORTA Ao Root diam: 2.20 cm MITRAL VALVE                 TRICUSPID VALVE MV Area (PHT): 3.72 cm      TR Peak grad:   29.4 mmHg MV Decel Time: 204 msec      TR Vmax:        271.00 cm/s MR Peak grad:    114.1 mmHg MR Mean grad:    72.0 mmHg   SHUNTS MR Vmax:         534.00 cm/s Systemic VTI:  0.26 m MR Vmean:        398.0 cm/s  Systemic Diam: 1.80 cm MR PISA:         2.26 cm MR PISA Eff ROA: 17 mm  MR PISA Radius:  0.60 cm MV E velocity: 136.00 cm/s MV A velocity: 110.00 cm/s MV E/A ratio:  1.24 Epifanio Lesches MD Electronically signed by Epifanio Lesches MD Signature Date/Time: 03/29/2020/10:08:09 PM    Final     Cardiac Studies   Echo  1. Left ventricular ejection fraction, by estimation, is 45 to 50%. The  left ventricle has mildly decreased function. The left ventricle  demonstrates global hypokinesis. There is mild left ventricular  hypertrophy. Left ventricular diastolic parameters  are consistent with Grade II diastolic dysfunction (pseudonormalization).  Elevated left atrial pressure.  2. Right ventricular systolic function is normal. The right ventricular  size is normal. There is normal pulmonary artery systolic pressure. The  estimated right ventricular systolic pressure is 32.4 mmHg.  3. The mitral valve is abnormal. Moderate mitral valve regurgitation.  Moderate mitral annular calcification.  4. The aortic valve is tricuspid. There is moderate thickening of the  aortic valve. Aortic valve regurgitation is mild to moderate. Moderate  aortic valve stenosis. Vmax 3.4 m/s, MG 24 mmHg, AVA 0.9 cm^2, DI 0.34  5. The inferior vena cava is normal in size with greater than 50%  respiratory variability, suggesting right atrial pressure of 3 mmHg.   Patient Profile     64 y.o. female  with a hx of bilateral carotid artery stenosis s/p stent placement, chronic systolic CHF, CAD s/p stenting to left main, Ost Cx and Ost LAD, HTN, ischemic CM, nonrheumatic arotic  valve stenosis, DM2, PAD s/p stenting of the left common iliac artery, chronic pain and obesity who is being seen for the evaluation of chest pain and CHF at the request of Dr. Sandre Kitty.  Assessment & Plan    CHEST PAIN:   Results of cath as above.  Plan CABG.  I reviewed the films with Dr. Clifton Donetta Isaza and discussed this with the patient.  She agrees to CABG.  Start heparin and discontinue Plavix.    CHRONIC SYSTOLIC HF:  EF is 45% on echo.  Euvolemic.  Continue current therapy.    AS:  Moderate.   There was also AI.  Likely medical management   MR:  Moderate.  She might need TEE to quantify.  This could be considered as an out patient.   ANEMIA:  This has been chronic and stable.  She might need another GI procedure at an outside facility for deep enterescopy.    For questions or updates, please contact [facility] Please consult [email or web address] for contact info under Cardiology/STEMI.   Signed, Rollene Rotunda, MD  03/30/2020, 8:35 AM

## 2020-03-30 NOTE — Progress Notes (Signed)
Pt. Asking questions about insulin orders. States that she takes care of blood glucose well at home and does not understand the need for four CBG checks a day or lantus. Pt. Was educated and agreed to insulin regimen. Recommend discussing insulin regimen with her more during hospital stay. Pt. Was also educated on the use of protonix after asking questions.

## 2020-03-30 NOTE — Progress Notes (Signed)
ANTICOAGULATION CONSULT NOTE - Initial Consult  Pharmacy Consult for IV Heparin Indication: chest pain/ACS  Allergies  Allergen Reactions  . Iodine Other (See Comments)    unk reaction per pt  . Peanut-Containing Drug Products Hives and Swelling  . Statins Other (See Comments)    myalgia    Patient Measurements: Height: 5' (152.4 cm) Weight: 100.4 kg (221 lb 6.4 oz) (scale c) IBW/kg (Calculated) : 45.5 Heparin Dosing Weight: 70 kg  Vital Signs: Temp: 98.8 F (37.1 C) (12/30 1100) BP: 110/53 (12/30 1351) Pulse Rate: 92 (12/30 1351)  Labs: Recent Labs    03/28/20 0739 03/28/20 0933 03/28/20 2000 03/29/20 0341 03/30/20 0244  HGB 7.6*  --  8.0* 8.3* 8.3*  HCT 26.6*  --  27.9* 26.7* 29.0*  PLT 273  --  240 258 259  CREATININE 0.72  --   --  0.68 0.63  TROPONINIHS 5 6  --   --   --     Estimated Creatinine Clearance: 75.7 mL/min (by C-G formula based on SCr of 0.63 mg/dL).   Medical History: Past Medical History:  Diagnosis Date  . Asthma   . CHF (congestive heart failure) (HCC)   . COPD (chronic obstructive pulmonary disease) (HCC)   . Diabetes mellitus without complication (HCC)   . Hypertension   . MI (myocardial infarction) (HCC)   . Sleep apnea     Assessment: 64 years of age female with significant cardiac history admitted on 03/28/20 with transient, exertional chest pain now s/p cardiac catheterization 03/30/20 found to have severe two-vessel CAD with unstable angina, moderate aortic stenosis, moderate MR, and LV EF of 45-50% with grade 2 diastolic function. CVTS has been consulted for consideration of surgical revascularization and or valvular surgery once Plavix has cleared (last dose today at 0817AM).  Pharmacy consulted to start IV Heparin therapy.   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 Hemoglobin stable  for admission at 8.3. Platelets are within normal limits.   Right radial sheath was removed at 9:29 AM and TR band placed. TR band removed at 1500 PM. No bleeding noted. Patient reports recent history of black stools requiring GI scans and findings of small bowel AVMs. She is not currently having any bleeding and knows to monitor closely. Discussed IV Heparin start, risk and benefits, and monitoring involved with patient.   Goal of Therapy:  Heparin level 0.3-0.7 units/ml Monitor platelets by anticoagulation protocol: Yes   Plan:  No Heparin Bolus due to recent catheterization and history of GIB.  Start IV Heparin at 700 units/hr (7 mL/hr).  Heparin level and CBC in ~6 hours.  Monitor daily Heparin level and CBC while on therapy.  Monitor closely for any signs or symptoms of bleeding with GIB history noted.   Heidi Morse, PharmD, BCPS, BCCCP Clinical Pharmacist Please refer to [facility] for [facility] Pharmacy numbers 03/30/2020,5:58 PM

## 2020-03-30 NOTE — Progress Notes (Signed)
I agree with previous charting from this shift  

## 2020-03-30 NOTE — Consult Note (Addendum)
[address]       [place] [address]             [phone]        [facility] Medical Record #[id number] Date of Birth: Mar 05, 1956  Referring: No ref. provider found Primary Care: Leola Brazil, DO Primary Cardiologist:No primary care provider on file.  Chief Complaint:    Chief Complaint  Patient presents with   Chest Pain    History of Present Illness:    Patient is a 64 year old female we are asked to see in cardiothoracic surgical consultation for consideration of CABG.  She has a previous medical history that includes CHF status post myocardial infarction as well as a previous ostial LAD stent in March 2021.  Additionally she has a history of hypertension, insulin-dependent diabetes mellitus type 2, COPD, asthma, sleep apnea, history of small bowel AVM, and diverticulosis.  She presented to the ER at [facility] on 03/28/2020 with worsening chest pain.  The chest pain has been transient and is primarily with activity but on presentation she had some chest pain that was at rest.  This was relieved with nitro.  She had a recent hospitalization at [facility] where she presented with anemia and hemoglobin was between 7 and 8.  Capsule endoscopy showed bleeding of AVM in the small bowel.  She did not require transfusion during this most recent hospitalization but has required multiple transfusions in the past for GI bleeding.  She was discharged home with plans for future endoscopy at a tertiary center.  At the time of her discharge her hemoglobin was stable with no evidence of active bleeding.  She did continue to have chest pain with exertion and presented to the ER.  In the ER she was found to have a hemoglobin of 7.6 and subsequently was transfused.  GI consultation was also obtained.  There are currently no plans for further intervention here but it is felt that she may  require a tertiary center where they can do deep enteroscopy.  Due to her  previous stent procedures and chest pain cardiology consultation was also obtained.  Initial EKG and troponins were not worrisome for findings consistent with ischemia but initial findings were felt to be significant as unstable angina.  Cardiac catheterization was done today and she was found to have a distal left main to ostial LAD 60% stenosis and an ostial circumflex lesion of 99%.  Please see the full report listed below.  The RCA has only minor disease with a 40% stenosis in the mid RCA.    Current Activity/ Functional Status:On disability with significant deconditioning. Uses walker/cane for mobility Zubrod Score: At the time of surgery this patients most appropriate activity status/level should be described as: []     0    Normal activity, no symptoms []     1    Restricted in physical strenuous activity but ambulatory, able to do out light work [x]     2    Ambulatory and capable of self care, unable to do work activities, up and about                 more than 50%  Of the time                            []     3    Only limited self care, in bed greater than  50% of waking hours []     4    Completely disabled, no self care, confined to bed or chair []     5    Moribund  Past Medical History:  Diagnosis Date   Asthma    CHF (congestive heart failure) (HCC)    COPD (chronic obstructive pulmonary disease) (HCC)    Diabetes mellitus without complication (HCC)    Hypertension    MI (myocardial infarction) (HCC)    Sleep apnea     History reviewed. No pertinent surgical history.  Social History   Tobacco Use  Smoking Status Not on file  Smokeless Tobacco Not on file    Social History   Substance and Sexual Activity  Alcohol Use None     Allergies  Allergen Reactions   Iodine Other (See Comments)    unk reaction per pt   Peanut-Containing Drug Products Hives and Swelling   Statins Other (See Comments)    myalgia    Current Facility-Administered Medications   Medication Dose Route Frequency Provider Last Rate Last Admin   0.9 %  sodium chloride infusion (Manually program via Guardrails IV Fluids)   Intravenous Once Kathleene Hazel, MD   Held at 03/28/20 1633   0.9 %  sodium chloride infusion   Intravenous Continuous Kathleene Hazel, MD 75 mL/hr at 03/30/20 1131 New Bag at 03/30/20 1131   0.9 %  sodium chloride infusion  250 mL Intravenous PRN Kathleene Hazel, MD       acetaminophen (TYLENOL) tablet 650 mg  650 mg Oral Q6H PRN Kathleene Hazel, MD       Or   acetaminophen (TYLENOL) suppository 650 mg  650 mg Rectal Q6H PRN Kathleene Hazel, MD       albuterol (VENTOLIN HFA) 108 (90 Base) MCG/ACT inhaler 2 puff  2 puff Inhalation Q6H PRN Kathleene Hazel, MD       aspirin EC tablet 81 mg  81 mg Oral Daily Kathleene Hazel, MD   81 mg at 03/29/20 1603   atorvastatin (LIPITOR) tablet 80 mg  80 mg Oral Daily Kathleene Hazel, MD   80 mg at 03/30/20 1610   cholecalciferol (VITAMIN D3) tablet 2,000 Units  2,000 Units Oral Daily Kathleene Hazel, MD   2,000 Units at 03/30/20 0817   famotidine (PEPCID) tablet 20 mg  20 mg Oral Daily Kathleene Hazel, MD   20 mg at 03/30/20 9604   ferrous sulfate tablet 325 mg  325 mg Oral BID Kathleene Hazel, MD   325 mg at 03/30/20 5409   furosemide (LASIX) tablet 40 mg  40 mg Oral Daily Kathleene Hazel, MD   40 mg at 03/29/20 1603   hydrALAZINE (APRESOLINE) injection 10 mg  10 mg Intravenous Q20 Min PRN Kathleene Hazel, MD       insulin aspart (novoLOG) injection 0-9 Units  0-9 Units Subcutaneous Q4H Kathleene Hazel, MD   3 Units at 03/30/20 1135   insulin glargine (LANTUS) injection 15 Units  15 Units Subcutaneous QHS Kathleene Hazel, MD       isosorbide mononitrate (IMDUR) 24 hr tablet 60 mg  60 mg Oral Daily Tyson Alias, MD   60 mg at 03/30/20 1132   labetalol (NORMODYNE) injection  10 mg  10 mg Intravenous Q10 min PRN Kathleene Hazel, MD       lisinopril (ZESTRIL) tablet 2.5 mg  2.5 mg Oral Daily Kathleene Hazel, MD  2.5 mg at 03/30/20 0818   metoprolol succinate (TOPROL-XL) 24 hr tablet 25 mg  25 mg Oral Daily Kathleene Hazel, MD   25 mg at 03/30/20 0818   mometasone-formoterol (DULERA) 200-5 MCG/ACT inhaler 2 puff  2 puff Inhalation BID Kathleene Hazel, MD   2 puff at 03/30/20 0804   montelukast (SINGULAIR) tablet 10 mg  10 mg Oral Daily Kathleene Hazel, MD   10 mg at 03/30/20 0819   ondansetron (ZOFRAN) tablet 4 mg  4 mg Oral Q6H PRN Kathleene Hazel, MD       Or   ondansetron Navos) injection 4 mg  4 mg Intravenous Q6H PRN Kathleene Hazel, MD   4 mg at 03/28/20 1742   pantoprazole (PROTONIX) injection 40 mg  40 mg Intravenous Q24H Kathleene Hazel, MD   40 mg at 03/29/20 2307   sodium chloride flush (NS) 0.9 % injection 3 mL  3 mL Intravenous Q12H Kathleene Hazel, MD   3 mL at 03/30/20 5784   sodium chloride flush (NS) 0.9 % injection 3 mL  3 mL Intravenous Q12H Kathleene Hazel, MD       sodium chloride flush (NS) 0.9 % injection 3 mL  3 mL Intravenous PRN Kathleene Hazel, MD        Medications Prior to Admission  Medication Sig Dispense Refill Last Dose   albuterol (VENTOLIN HFA) 108 (90 Base) MCG/ACT inhaler Inhale 2 puffs into the lungs every 6 (six) hours as needed for wheezing.   Past Week at Unknown time   amLODipine (NORVASC) 2.5 MG tablet Take 2.5 mg by mouth daily.      aspirin 81 MG EC tablet Take 81 mg by mouth daily.   03/27/2020 at Unknown time   atorvastatin (LIPITOR) 80 MG tablet Take 80 mg by mouth daily.   03/27/2020 at Unknown time   Cholecalciferol 25 MCG (1000 UT) tablet Take 2,000 Units by mouth daily.   03/27/2020 at Unknown time   clopidogrel (PLAVIX) 75 MG tablet Take 75 mg by mouth daily.   03/27/2020 at 0730   famotidine (PEPCID) 20 MG  tablet Take 20 mg by mouth daily.   03/27/2020 at Unknown time   ferrous sulfate 325 (65 FE) MG tablet Take 1 tablet by mouth 2 (two) times daily.   03/27/2020 at Unknown time   Fluticasone-Salmeterol (ADVAIR) 250-50 MCG/DOSE AEPB Inhale 1 puff into the lungs in the morning and at bedtime.   03/27/2020   furosemide (LASIX) 40 MG tablet Take 40 mg by mouth daily.   03/27/2020 at Unknown time   gabapentin (NEURONTIN) 300 MG capsule Take 300 mg by mouth in the morning, at noon, in the evening, and at bedtime.   03/27/2020 at Unknown time   HYDROcodone-acetaminophen (NORCO) 10-325 MG tablet Take 1 tablet by mouth every 12 (twelve) hours.   03/27/2020 at Unknown time   insulin glargine (LANTUS) 100 UNIT/ML injection Inject 24 Units into the skin 2 (two) times daily as needed (for elevated blood over 150).   Past Week at Unknown time   lisinopril (ZESTRIL) 2.5 MG tablet Take 2.5 mg by mouth daily.   03/27/2020 at Unknown time   metoprolol succinate (TOPROL-XL) 25 MG 24 hr tablet Take 25 mg by mouth daily.   03/27/2020 at 0730   montelukast (SINGULAIR) 10 MG tablet Take 10 mg by mouth daily.   03/27/2020 at Unknown time   nitroGLYCERIN (NITROSTAT) 0.4 MG SL tablet Place 0.4 mg  under the tongue every 5 (five) minutes as needed for chest pain.   03/27/2020 at Unknown time   valACYclovir (VALTREX) 500 MG tablet Take 500 mg by mouth as needed (for flare ups).   couple months    No family history on file.   Review of Systems:   Review of Systems  Constitutional: Positive for malaise/fatigue. Negative for chills, diaphoresis, fever and weight loss.  HENT: Negative for congestion, ear discharge, ear pain, hearing loss, nosebleeds, sinus pain, sore throat and tinnitus.   Eyes: Positive for double vision. Negative for blurred vision, photophobia, pain, discharge and redness.  Respiratory: Positive for cough and sputum production. Negative for hemoptysis, shortness of breath, wheezing and stridor.    Cardiovascular: Positive for chest pain and palpitations. Negative for orthopnea, claudication, leg swelling and PND.  Gastrointestinal: Positive for blood in stool, heartburn and melena. Negative for abdominal pain, constipation, diarrhea, nausea and vomiting.  Genitourinary: Positive for frequency. Negative for dysuria, flank pain, hematuria and urgency.  Musculoskeletal: Positive for back pain and joint pain. Negative for falls, myalgias and neck pain.  Skin: Negative.   Neurological: Negative for dizziness, tingling, tremors, sensory change, speech change, focal weakness, seizures, loss of consciousness, weakness and headaches.  Endo/Heme/Allergies: Negative for environmental allergies and polydipsia. Bruises/bleeds easily.  Psychiatric/Behavioral: Positive for depression, memory loss and suicidal ideas. Negative for hallucinations and substance abuse. The patient has insomnia. The patient is not nervous/anxious.       Physical Exam: BP 123/68    Pulse 83    Temp 98.8 F (37.1 C)    Resp 20    Ht 5' (1.524 m)    Wt 100.4 kg Comment: scale c   LMP  (LMP Unknown)    SpO2 100%    BMI 43.24 kg/m   Physical Exam  Constitutional: She appears chronically ill.  HENT:  Nose: No nasal discharge.  Mouth/Throat: Dental caries present. Oropharynx is clear. Pharynx is normal.  Eyes: Pupils are equal, round, and reactive to light. Conjunctivae are normal.  Arcus senilis  Neck: Thyroid normal. No JVD present. No neck adenopathy. No thyromegaly present.  Cardiovascular: Normal rate and regular rhythm. Exam reveals no S3 and no S4.  Murmur heard.  Harsh holosystolic murmur is present with a grade of 3/6. Pulses:      Dorsalis pedis pulses are 0 on the right side and 0 on the left side.       Posterior tibial pulses are 0 on the right side and 0 on the left side.  Bilateral bruits v transmitted murmur  Pulmonary/Chest: She has no wheezes. She has no rales. She exhibits no tenderness.  Dim BS in  lower fields  Abdominal: Soft. Bowel sounds are normal. She exhibits no distension and no mass. There is no hepatomegaly. There is no abdominal tenderness.  Obese   Musculoskeletal:        General: No tenderness, deformity or edema.     Cervical back: Normal range of motion and neck supple.  Neurological: She is alert and oriented to person, place, and time.  Skin: Skin is warm and dry. No rash noted. No cyanosis. No jaundice or pallor. Nails show no clubbing.      Diagnostic Studies & Laboratory data:     Recent Radiology Findings:     LEFT HEART CATH AND CORONARY ANGIOGRAPHY    Conclusion    Prox RCA to Mid RCA lesion is 40% stenosed.  Ost Cx lesion is 99% stenosed.  Dist LM  to [facility] LAD lesion is 60% stenosed.   1. Patent left main stent with distal moderate restenosis. The stent extends into the LAD. There is haziness in the ostium of the LAD suggesting at least moderate restenosis of the stent. Another stent extends from the left main into the Circumflex. Severe ostial Circumflex restenosis. Good targets for bypass in the obtuse marginal branches and mid LAD.   Recommendations: She has restenosis of the distal left main stented segment, ostial Circumflex stented segment and ostial LAD stented segment. She has diabetes and good targets for bypass. While balloon angioplasty of the severely restenotic segments is possible, there would not be a good long term result with high chance of recurrent restenosis. I have reviewed her images with Dr. Antoine Poche. I think the best option is to consider bypass surgery. She is relatively young and a diabetic. Will ask CT surgery to see her. Also note that she has received Plavix today and has ongoing issues with anemia/GI bleeding. Plavix will be held going forward in anticipation of bypass surgery.    Recommendations  Antiplatelet/Anticoag She has restenosis of the distal left main stented segment, ostial Circumflex stented segment and  ostial LAD stented segment. She has diabetes and good targets for bypass. While balloon angioplasty of the severely restenotic segments is possible, there would not be a good long term result with high chance of recurrent restenosis. I have reviewed her images with Dr. Antoine Poche. I think the best option is to consider bypass surgery. She is relatively young and a diabetic. Will ask CT surgery to see her. Also not that she has received Plavix today and has ongoing issues with anemia/GI bleeding.   Indications  Unstable angina (HCC) [I20.0 (ICD-10-CM)]   Procedural Details  Technical Details Indication: Previous complex bifurcational stenting of the left main/LAD/Circumflex at [facility] being admitted with unstable angina, negative troponin.   Procedure: The risks, benefits, complications, treatment options, and expected outcomes were discussed with the patient. The patient and/or family concurred with the proposed plan, giving informed consent. The patient was brought to the cath lab after IV hydration was given. The patient was sedated with Versed and Fentanyl. The right wrist was prepped and draped in a sterile fashion. 1% lidocaine was used for local anesthesia. Using the modified Seldinger access technique, a 5 French sheath was placed in the right radial artery. 3 mg Verapamil was given through the sheath. 5000 units IV heparin was given. Standard diagnostic catheters were used to perform selective coronary angiography. A pigtail catheter was used to measure LV pressures. The sheath was removed from the right radial artery and a Terumo hemostasis band was applied at the arteriotomy site on the right wrist.    Estimated blood loss <50 mL.   During this procedure medications were administered to achieve and maintain moderate conscious sedation while the patient's heart rate, blood pressure, and oxygen saturation were continuously monitored and I was present face-to-face 100% of this time.    Medications (Filter: Administrations occurring from [phone] to 0932 on 03/30/20) (important) Continuous medications are totaled by the amount administered until 03/30/20 0932.    fentaNYL (SUBLIMAZE) injection (mcg) Total dose:  50 mcg  Date/Time Rate/Dose/Volume Action   03/30/20 0859 25 mcg Given   0905 25 mcg Given    midazolam (VERSED) injection (mg) Total dose:  2 mg  Date/Time Rate/Dose/Volume Action   03/30/20 0900 1 mg Given   0905 1 mg Given    Heparin (Porcine) in NaCl 1000-0.9 UT/500ML-%  SOLN (mL) Total volume:  500 mL  Date/Time Rate/Dose/Volume Action   03/30/20 0900 500 mL Given    Heparin (Porcine) in NaCl 1000-0.9 UT/500ML-% SOLN (mL) Total volume:  500 mL  Date/Time Rate/Dose/Volume Action   03/30/20 0900 500 mL Given    lidocaine (PF) (XYLOCAINE) 1 % injection (mL) Total volume:  2 mL  Date/Time Rate/Dose/Volume Action   03/30/20 0905 2 mL Given    Radial Cocktail/Verapamil only (mL) Total volume:  10 mL  Date/Time Rate/Dose/Volume Action   03/30/20 0908 10 mL Given    heparin sodium (porcine) injection (Units) Total dose:  5,000 Units  Date/Time Rate/Dose/Volume Action   03/30/20 0909 5,000 Units Given    0.9 % sodium chloride infusion (Manually program via Guardrails IV Fluids) Total dose:  Cannot be calculated* Dosing weight:  106.1  *Administration dose not documented Date/Time Rate/Dose/Volume Action   03/30/20 0852 *Not included in total MAR Hold    albuterol (VENTOLIN HFA) 108 (90 Base) MCG/ACT inhaler 2 puff (puff) Total dose:  Cannot be calculated* Dosing weight:  106.1  *Administration dose not documented Date/Time Rate/Dose/Volume Action   03/30/20 0852 *Not included in total MAR Hold    aspirin EC tablet 81 mg (mg) Total dose:  Cannot be calculated* Dosing weight:  106.1  *Administration dose not documented Date/Time Rate/Dose/Volume Action   03/30/20 0852 *Not included in total MAR Hold    atorvastatin (LIPITOR)  tablet 80 mg (mg) Total dose:  Cannot be calculated* Dosing weight:  106.1  *Administration dose not documented Date/Time Rate/Dose/Volume Action   03/30/20 0852 *Not included in total MAR Hold    cholecalciferol (VITAMIN D3) tablet 2,000 Units (Units) Total dose:  Cannot be calculated* Dosing weight:  106.1  *Administration dose not documented Date/Time Rate/Dose/Volume Action   03/30/20 0852 *Not included in total MAR Hold    famotidine (PEPCID) tablet 20 mg (mg) Total dose:  Cannot be calculated* Dosing weight:  106.1  *Administration dose not documented Date/Time Rate/Dose/Volume Action   03/30/20 0852 *Not included in total MAR Hold    ferrous sulfate tablet 325 mg (mg) Total dose:  Cannot be calculated* Dosing weight:  106.1  *Administration dose not documented Date/Time Rate/Dose/Volume Action   03/30/20 0852 *Not included in total MAR Hold    furosemide (LASIX) tablet 40 mg (mg) Total dose:  Cannot be calculated* Dosing weight:  106.1  *Administration dose not documented Date/Time Rate/Dose/Volume Action   03/30/20 0852 *Not included in total MAR Hold    insulin aspart (novoLOG) injection 0-9 Units (Units) Total dose:  Cannot be calculated* Dosing weight:  106.1  *Administration dose not documented Date/Time Rate/Dose/Volume Action   03/30/20 0852 *Not included in total MAR Hold    insulin glargine (LANTUS) injection 15 Units (Units) Total dose:  Cannot be calculated* Dosing weight:  106.1  *Administration dose not documented Date/Time Rate/Dose/Volume Action   03/30/20 0852 *Not included in total MAR Hold    lisinopril (ZESTRIL) tablet 2.5 mg (mg) Total dose:  Cannot be calculated* Dosing weight:  106.1  *Administration dose not documented Date/Time Rate/Dose/Volume Action   03/30/20 0852 *Not included in total MAR Hold    metoprolol succinate (TOPROL-XL) 24 hr tablet 25 mg (mg) Total dose:  Cannot be calculated* Dosing weight:   106.1  *Administration dose not documented Date/Time Rate/Dose/Volume Action   03/30/20 0852 *Not included in total MAR Hold    mometasone-formoterol (DULERA) 200-5 MCG/ACT inhaler 2 puff (puff) Total dose:  Cannot be calculated* Dosing weight:  106.1  *Administration dose not documented Date/Time Rate/Dose/Volume Action   03/30/20 0852 *Not included in total MAR Hold    montelukast (SINGULAIR) tablet 10 mg (mg) Total dose:  Cannot be calculated* Dosing weight:  106.1  *Administration dose not documented Date/Time Rate/Dose/Volume Action   03/30/20 0852 *Not included in total MAR Hold    pantoprazole (PROTONIX) injection 40 mg (mg) Total dose:  Cannot be calculated* Dosing weight:  106.1  *Administration dose not documented Date/Time Rate/Dose/Volume Action   03/30/20 0852 *Not included in total MAR Hold    sodium chloride flush (NS) 0.9 % injection 3 mL (mL) Total dose:  Cannot be calculated* Dosing weight:  101.4  *Administration dose not documented Date/Time Rate/Dose/Volume Action   03/30/20 0852 *Not included in total MAR Hold    clopidogrel (PLAVIX) tablet 75 mg (mg) Total dose:  Cannot be calculated* Dosing weight:  106.1  *Administration dose not documented Date/Time Rate/Dose/Volume Action   03/30/20 0852 *Not included in total MAR Hold    isosorbide mononitrate (IMDUR) 24 hr tablet 60 mg (mg) Total dose:  Cannot be calculated* Dosing weight:  100.4  *Administration dose not documented Date/Time Rate/Dose/Volume Action   03/30/20 0852 *Not included in total MAR Hold    Sedation Time  Sedation Time Physician-1: 26 minutes 16 seconds   Radiation/Fluoro  Fluoro time: 4.2 (min) DAP: 21.9 (mGycm2) Cumulative Air Kerma: 467.3 (mGy)   Complications   Complications documented before study signed (03/30/2020 9:56 AM)     LEFT HEART CATH AND CORONARY ANGIOGRAPHY  None Documented by Kathleene Hazel, MD 03/30/2020 9:37 AM  Date Found:  03/30/2020  Time Range: Intraprocedure       Coronary Findings   Diagnostic Dominance: Right  Left Main  Dist LM to Ost LAD lesion is 60% stenosed. The lesion was previously treated using a drug eluting stent between 6-12 months ago.  Left Circumflex  Vessel is large.  Ost Cx lesion is 99% stenosed. The lesion was previously treated using a drug eluting stent .  Right Coronary Artery  Vessel is large.  Prox RCA to Mid RCA lesion is 40% stenosed.   Intervention   No interventions have been documented.  Coronary Diagrams   Diagnostic Dominance: Right    Intervention      ECHOCARDIOGRAM REPORT       Patient Name:  Heidi Morse Date of Exam: 03/29/2020  Medical Rec #: [id number]   Height:    60.0 in  Accession #:  [id number]   Weight:    223.6 lb  Date of Birth: 1955-04-24   BSA:     1.957 m  Patient Age:  64 years    BP:      126/73 mmHg  Patient Gender: F       HR:      73 bpm.  Exam Location: Inpatient   Procedure: 2D Echo   Indications:  Chest Pain    History:    Patient has no prior history of Echocardiogram  examinations.         CHF, Previous Myocardial Infarction, COPD; Risk         Factors:Hypertension and Diabetes.    Sonographer:  Thurman Coyer RDCS (AE)  Referring Phys: [id number] Elwin Mocha RAINES   IMPRESSIONS    1. Left ventricular ejection fraction, by estimation, is 45 to 50%. The  left ventricle has mildly decreased function. The left ventricle  demonstrates global hypokinesis. There is mild left ventricular  hypertrophy. Left ventricular diastolic parameters  are consistent with Grade II diastolic dysfunction (pseudonormalization).  Elevated left atrial pressure.  2. Right ventricular systolic function is normal. The right ventricular  size is normal. There is normal pulmonary artery systolic pressure. The  estimated right ventricular systolic  pressure is 32.4 mmHg.  3. The mitral valve is abnormal. Moderate mitral valve regurgitation.  Moderate mitral annular calcification.  4. The aortic valve is tricuspid. There is moderate thickening of the  aortic valve. Aortic valve regurgitation is mild to moderate. Moderate  aortic valve stenosis. Vmax 3.4 m/s, MG 24 mmHg, AVA 0.9 cm^2, DI 0.34  5. The inferior vena cava is normal in size with greater than 50%  respiratory variability, suggesting right atrial pressure of 3 mmHg.   FINDINGS  Left Ventricle: Left ventricular ejection fraction, by estimation, is 45  to 50%. The left ventricle has mildly decreased function. The left  ventricle demonstrates global hypokinesis. The left ventricular internal  cavity size was normal in size. There is  mild left ventricular hypertrophy. Left ventricular diastolic parameters  are consistent with Grade II diastolic dysfunction (pseudonormalization).  Elevated left atrial pressure.   Right Ventricle: The right ventricular size is normal. No increase in  right ventricular wall thickness. Right ventricular systolic function is  normal. There is normal pulmonary artery systolic pressure. The tricuspid  regurgitant velocity is 2.71 m/s, and  with an assumed right atrial pressure of 3 mmHg, the estimated right  ventricular systolic pressure is 32.4 mmHg.   Left Atrium: Left atrial size was normal in size.   Right Atrium: Right atrial size was normal in size.   Pericardium: There is no evidence of pericardial effusion.   Mitral Valve: The mitral valve is abnormal. Moderate mitral annular  calcification. Moderate mitral valve regurgitation.   Tricuspid Valve: The tricuspid valve is normal in structure. Tricuspid  valve regurgitation is trivial.   Aortic Valve: The aortic valve is tricuspid. There is moderate thickening  of the aortic valve. Aortic valve regurgitation is mild to moderate.  Aortic regurgitation PHT measures 255 msec.  Moderate aortic stenosis is  present. Aortic valve mean gradient  measures 22.5 mmHg. Aortic valve peak gradient measures 44.2 mmHg. Aortic  valve area, by VTI measures 0.93 cm.   Pulmonic Valve: The pulmonic valve was not well visualized. Pulmonic valve  regurgitation is trivial.   Aorta: The aortic root and ascending aorta are structurally normal, with  no evidence of dilitation.   Venous: The inferior vena cava is normal in size with greater than 50%  respiratory variability, suggesting right atrial pressure of 3 mmHg.   IAS/Shunts: The interatrial septum was not well visualized.     LEFT VENTRICLE  PLAX 2D  LVIDd:     5.60 cm Diastology  LVIDs:     3.90 cm LV e' medial:  5.44 cm/s  LV PW:     1.10 cm LV E/e' medial: 25.0  LV IVS:    1.10 cm LV e' lateral:  4.46 cm/s  LVOT diam:   1.80 cm LV E/e' lateral: 30.5  LV SV:     65  LV SV Index:  33  LVOT Area:   2.54 cm     LEFT ATRIUM       Index    RIGHT ATRIUM      Index  LA diam:    4.50 cm 2.30 cm/m RA Area:   16.10 cm  LA Vol (A2C):  83.3 ml 42.56 ml/m RA Volume:  38.70 ml 19.77 ml/m  LA Vol (A4C):  21.4 ml 10.93 ml/m  LA Biplane Vol: 44.7 ml 22.84 ml/m  AORTIC VALVE  AV Area (Vmax):  0.83 cm  AV Area (Vmean):  0.94 cm  AV Area (VTI):   0.93 cm  AV Vmax:      332.50 cm/s  AV Vmean:     216.000 cm/s  AV VTI:      0.704 m  AV Peak Grad:   44.2 mmHg  AV Mean Grad:   22.5 mmHg  LVOT Vmax:     109.00 cm/s  LVOT Vmean:    79.400 cm/s  LVOT VTI:     0.256 m  LVOT/AV VTI ratio: 0.36  AI PHT:      255 msec    AORTA  Ao Root diam: 2.20 cm   MITRAL VALVE         TRICUSPID VALVE  MV Area (PHT): 3.72 cm   TR Peak grad:  29.4 mmHg  MV Decel Time: 204 msec   TR Vmax:    271.00 cm/s  MR Peak grad:  114.1 mmHg  MR Mean grad:  72.0 mmHg  SHUNTS  MR Vmax:     534.00 cm/s Systemic  VTI: 0.26 m  MR Vmean:    398.0 cm/s Systemic Diam: 1.80 cm  MR PISA:     2.26 cm  MR PISA Eff ROA: 17 mm  MR PISA Radius: 0.60 cm  MV E velocity: 136.00 cm/s  MV A velocity: 110.00 cm/s  MV E/A ratio: 1.24   Epifanio Lesches MD  Electronically signed by Epifanio Lesches MD  Signature Date/Time: 03/29/2020/10:08:09 PM      Final    I have independently reviewed the above radiologic studies and discussed with the patient   Recent Lab Findings: Lab Results  Component Value Date   WBC 8.5 03/30/2020   HGB 8.3 (L) 03/30/2020   HCT 29.0 (L) 03/30/2020   PLT 259 03/30/2020   GLUCOSE 127 (H) 03/30/2020   ALT 12 03/29/2020   AST 12 (L) 03/29/2020   NA 140 03/30/2020   K 3.8 03/30/2020   CL 105 03/30/2020   CREATININE 0.63 03/30/2020   BUN 17 03/30/2020   CO2 24 03/30/2020   HGBA1C 5.2 03/29/2020      Assessment / Plan:    1.  Severe two-vessel coronary artery disease with unstable angina.  LV EH 45 to 50% with grade 2 diastolic dysfunction.  History of congestive heart failure/previous myocardial infarction 2.  Moderate aortic stenosis, mild to moderate aortic regurgitation 3.  Moderate mitral regurgitation 4.  Significant aortic calcification on chest x-ray 5.  History of recent significant anemia associated with small bowel AVMs.  Previous history of GI bleeding related to this for the past several years. 6.  Diabetes mellitus type 2, insulin-dependent with good recent control per hemoglobin A1c of 5.2 on 03/29/2020.  She does have severe metabolic syndrome sequelae. 7.  COPD 8.  Hypertension 9.  OSA on nighttime BiPAP 10.  Bilateral carotid bruits versus transmitted murmur will require carotid ultrasound preoperatively.  Will probably require TEE to better quantify the valvular findings. 11 Statin intol 12 obesity   Plan: The surgeon will review the patient and pertinent studies for consideration of surgical revascularization.  It may also  be determined that she would benefit from valvular surgery as well.  Surgical intervention would be considered moderate to high risk with multiple comorbidities and recent GI bleed.  I  spent 60 minutes counseling the patient  face to face.   Rowe Clack, PA-C 03/30/2020 11:50 AM    Agree with above. This is a 64 year old female who presents with unstable angina in the setting of coronary artery disease and multiple PCI's.  She has been seen on numerous occasions at [facility] with chest pain but presented to our hospital, and underwent a left heart cath which showed in-stent restenosis of her left main coronary stents.  She additionally has an echocardiogram which shows an EF of 45 to 50%, a  calcified aortic valve which is moderately stenotic, and moderate mitral valve regurgitation.  On review of her left heart cath she has good targets for surgical bypass, and given the location of her stents I do think that surgical revascularization will be a good option.  I do think that an aortic valve replacement at the time of surgery is also warranted.  In regards to her recurrent GI bleeds, I question whether this is related to an acquired von Willebrand's deficiency from her aortic valve.  Typically this is seen in an critical aortic stenosis, but her valve is heavily calcified.  In regards to her mitral valve, I think that relieving the pressure from her aortic valve, as well as surgical revascularization should help in treating the mitral valve regurgitation.  She has been on Plavix, and I will check a P2 Y 12, as well as obtain dental clearance.  She is tentatively scheduled for AVR CABG early next week.  Phinneas Shakoor Keane Scrape

## 2020-03-30 NOTE — Interval H&P Note (Signed)
History and Physical Interval Note:  03/30/2020 8:45 AM  Heidi Morse  has presented today for surgery, with the diagnosis of chest pain.  The various methods of treatment have been discussed with the patient and family. After consideration of risks, benefits and other options for treatment, the patient has consented to  Procedure(s): LEFT HEART CATH AND CORONARY ANGIOGRAPHY (N/A) as a surgical intervention.  The patient's history has been reviewed, patient examined, no change in status, stable for surgery.  I have reviewed the patient's chart and labs.  Questions were answered to the patient's satisfaction.    Cath Lab Visit (complete for each Cath Lab visit)  Clinical Evaluation Leading to the Procedure:   ACS: No.  Non-ACS:    Anginal Classification: CCS III  Anti-ischemic medical therapy: Minimal Therapy (1 class of medications)  Non-Invasive Test Results: No non-invasive testing performed  Prior CABG: No previous CABG        Verne Carrow

## 2020-03-30 NOTE — Progress Notes (Signed)
TCTS consulted for CABG evaluation. °

## 2020-03-31 ENCOUNTER — Other Ambulatory Visit (HOSPITAL_COMMUNITY): Payer: Medicaid Other

## 2020-03-31 DIAGNOSIS — K922 Gastrointestinal hemorrhage, unspecified: Secondary | ICD-10-CM

## 2020-03-31 DIAGNOSIS — D509 Iron deficiency anemia, unspecified: Secondary | ICD-10-CM

## 2020-03-31 DIAGNOSIS — J449 Chronic obstructive pulmonary disease, unspecified: Secondary | ICD-10-CM

## 2020-03-31 DIAGNOSIS — G4733 Obstructive sleep apnea (adult) (pediatric): Secondary | ICD-10-CM

## 2020-03-31 DIAGNOSIS — I200 Unstable angina: Secondary | ICD-10-CM | POA: Diagnosis not present

## 2020-03-31 DIAGNOSIS — I340 Nonrheumatic mitral (valve) insufficiency: Secondary | ICD-10-CM

## 2020-03-31 DIAGNOSIS — I5043 Acute on chronic combined systolic (congestive) and diastolic (congestive) heart failure: Secondary | ICD-10-CM | POA: Diagnosis not present

## 2020-03-31 DIAGNOSIS — I110 Hypertensive heart disease with heart failure: Secondary | ICD-10-CM

## 2020-03-31 LAB — BASIC METABOLIC PANEL
Anion gap: 10 (ref 5–15)
BUN: 20 mg/dL (ref 8–23)
CO2: 20 mmol/L — ABNORMAL LOW (ref 22–32)
Calcium: 8.8 mg/dL — ABNORMAL LOW (ref 8.9–10.3)
Chloride: 111 mmol/L (ref 98–111)
Creatinine, Ser: 0.74 mg/dL (ref 0.44–1.00)
GFR, Estimated: >60 mL/min (ref >60)
Glucose, Bld: 201 mg/dL — ABNORMAL HIGH (ref 70–99)
Potassium: 4 mmol/L (ref 3.5–5.1)
Sodium: 141 mmol/L (ref 135–145)

## 2020-03-31 LAB — HEPARIN LEVEL (UNFRACTIONATED)
Heparin Unfractionated: <0.1 IU/mL — ABNORMAL LOW (ref 0.30–0.70)
Heparin Unfractionated: <0.1 IU/mL — ABNORMAL LOW (ref 0.30–0.70)

## 2020-03-31 LAB — CBC
HCT: 27.4 % — ABNORMAL LOW (ref 36.0–46.0)
Hemoglobin: 8.1 g/dL — ABNORMAL LOW (ref 12.0–15.0)
MCH: 25.4 pg — ABNORMAL LOW (ref 26.0–34.0)
MCHC: 29.6 g/dL — ABNORMAL LOW (ref 30.0–36.0)
MCV: 85.9 fL (ref 80.0–100.0)
Platelets: 273 10*3/uL (ref 150–400)
RBC: 3.19 MIL/uL — ABNORMAL LOW (ref 3.87–5.11)
RDW: 18.2 % — ABNORMAL HIGH (ref 11.5–15.5)
WBC: 13.3 10*3/uL — ABNORMAL HIGH (ref 4.0–10.5)
nRBC: 0.2 % (ref 0.0–0.2)

## 2020-03-31 LAB — TROPONIN I (HIGH SENSITIVITY)
Troponin I (High Sensitivity): 7 ng/L (ref <18)
Troponin I (High Sensitivity): 7 ng/L (ref <18)

## 2020-03-31 LAB — GLUCOSE, CAPILLARY
Glucose-Capillary: 147 mg/dL — ABNORMAL HIGH (ref 70–99)
Glucose-Capillary: 128 mg/dL — ABNORMAL HIGH (ref 70–99)
Glucose-Capillary: 156 mg/dL — ABNORMAL HIGH (ref 70–99)
Glucose-Capillary: 146 mg/dL — ABNORMAL HIGH (ref 70–99)

## 2020-03-31 MED ORDER — NITROGLYCERIN 0.4 MG SL SUBL
SUBLINGUAL_TABLET | SUBLINGUAL | Status: AC
  Administered 2020-03-31: 0.4 mg
  Filled 2020-03-31: qty 2

## 2020-03-31 MED ORDER — NITROGLYCERIN 0.4 MG SL SUBL
SUBLINGUAL_TABLET | SUBLINGUAL | Status: AC
  Administered 2020-03-31: 0.4 mg
  Filled 2020-03-31: qty 3

## 2020-03-31 MED ORDER — NITROGLYCERIN 0.4 MG SL SUBL
SUBLINGUAL_TABLET | SUBLINGUAL | Status: AC
  Administered 2020-03-31: 0.4 mg
  Filled 2020-03-31: qty 1

## 2020-03-31 NOTE — Progress Notes (Signed)
4847 Chest pain 10/10 BP 144/81 HR-96 SL nitro given  0219 Chest pain 10/10 BP 149/76 HR-99 SL nitro given  0223 Chest pain 6/10 BP-127/69 HR 98 SL nitro given  0231 Chest pain 5/10 BP 109/71 HR-92

## 2020-03-31 NOTE — Progress Notes (Addendum)
On Heparin drip. She did have chest pain on at least 2 occassions earlier this am (after 2 am) and she said nurse gave Nitro x 3 with relief. She is eating breakfast this am.She denies chest pain or shortness of breath this am. Likely CABG, AVR next week.  Agree with above. Chest pain overnight, resolved with sublingual nitroglycerin Plavix nonresponder with a P2 Y 12 over 200 Panorex does not show any significant abscesses. Scheduled for AVR CABG on Monday.  Heidi Morse

## 2020-03-31 NOTE — Progress Notes (Signed)
STATUS UPDATE:  Paged by RN patient having recurrent chest pain after SL nitro x2.   Dr. Barbaraann Faster and I saw patient at bedside. Patient reports she started having mid-sternal, pressure-like, non-radiating, 10/10 chest pain this evening. States she sat up on the side of the bed which momentarily relieved pain, although returned quickly. RN gave patient 2 doses of nitro, which decreased the pain. Another nitro given, which reduced the pain even further. Currently states she is having minimal chest pain, no dyspnea, diaphoresis, nausea, vomiting, or abdominal pain. Mentions she has hx GERD, but pain does not feel similar.  EXAM: BP 123/60, HR 95 General: Obese female, no acute distress CV: Regular rate, rhythm. Systolic murmur appreciated.  Pulm: Clear to auscultation bilaterally. No wheezing, rales, rhonchi. MSK: No pitting edema bilaterally  CBC this AM: WBC 13<8 Hgb 8.1<8.3  Yesterday's BMP unremarkable, will follow-up BMP this AM  ECG obtained, normal sinus rhythm without ST changes. Compared to prior study, appears axis deviated more left with worsening R wave progression.  Patient admitted with unstable angina. Cath earlier today showed multi-vessel disease. CT surgery consulted, patient awaiting CABG next week.  If chest pain continues to be refractory to SL nitro, plan to start nitro gtt. Patient already on heparin gtt. Patient to inform RN if she has further CP. Will follow-up troponin and continue to monitor.  -F/u trop, BMP this AM - Nitro PRN   Evlyn Kanner, MD Internal Medicine PGY-1 Pgr: [phone]

## 2020-03-31 NOTE — Progress Notes (Signed)
Subjective:   Ms. Grieder was examined at the bedside this morning and states that she did not sleep well because of CP. Her pain was similar to her pain on admission, improved for 1 hour after receiving first dose of NTG, then received an additional dose with less response. No CP on evaluation, although nursing noted she did have recurrence of CP early this afternoon although declined NTG. Stated her pain was not as bad and her vitals were stable at that time.   Objective:  Vital signs in last 24 hours: Vitals:   03/31/20 0231 03/31/20 0455 03/31/20 0800 03/31/20 1142  BP: 109/72 121/71 124/68 137/72  Pulse: 92 93 87 87  Resp:  18  18  Temp:  (!) 97.5 F (36.4 C)  98.6 F (37 C)  TempSrc:  Oral  Oral  SpO2:  100% 100% 99%  Weight:  101.2 kg    Height:       General: Patient is mobidly obese. Appears well in no acute distress. Respiratory: Lungs are CTA, bilaterally. No wheezes, rales, or rhonchi.  Cardiovascular: Rate is tachycardic, rhythm is regular. 1/6 SEM heard throughout chest, loudest at right upper sternal border. No lower extremity edema. No apparent JVD. Neurological: Alert and oriented. Psych: Normal affect. Normal tone of voice.   Assessment/Plan:  Principal Problem:   Unstable angina (HCC) Active Problems:   Symptomatic anemia   Aortic stenosis  #Unstable Angina  Patient continues to experience intermittent central chest pressure that improves with NTG.  Plan is to proceed with CABG and TAVR on Monday, January 3.  She is currently on IV heparin and ASA, holding Plavix. - Cardiology and CT surgery on board, appreciate their recommendations  - Notified patient and RN to notify us if CP continues to try NTG paste vs. IV NTG - Continue home lipitor  - Continue cardiac rehabilitation  # Acute on Chronic IDA likely 2/2 UGIB, Stable  # Chronic iron deficiency anemia Hemoglobin remained stable at 8.1. She is status post 1 unit PRBC and IV Feraheme on admission.   Recent history of gastrointestinal AVMs status post ablation, most recently in November 2021 and a capsule endoscopy 03/21/2020 that showed recurrent AVM bleeding.  No acute bleed since hospitalization although hemoglobin borderline at 8.1.  Will continue to monitor.  If any signs or symptoms of bleed will need to stop heparin. - Continue to monitor for signs of GI bleeding - Continue PO iron supplementation  - Continue daily CBC monitoring  - Transfuse RBC's if Hgb < 8.0    # Acute on Chronic Combined CHF  # Aortic Stenosis, Moderate  # Mitral Regurgitation, Moderate Per cardiology, plan is to proceed with TAVR at the time of CABG due to heavy calcified aortic valve. Mitral regurgitation is moderate and plan is to continue medical management.  CABG planned for Monday. - May require TEE this admission; follow cardiology recommendations - Continue Lasix 40mg  PO daily for now - Strict I&O - Check daily weights  # Hx of Hypertension Blood pressures more stable today. Will continue to hold amlodipine for now however can resume if blood pressures rise. - Continue to hold amlodipine, can resume if pressures start to rise - Continue to monitor   # Asthma # COPD # OSA Patient has been refusing to wear CPAP at night which is likely contributing to pain she experiences at night.  - Encourage CPAP at night - Continue home medications  # IDDM Type II CBG's improved today with  increase in Lantus from 15 to 18 units nightly. -Continue Lantus 18 units nightly -Sliding scale -CBG monitoring  Prior to Admission Living Arrangement: Home Anticipated Discharge Location: SNF following CABG  Barriers to Discharge: Patient needs TAVR and CABG Dispo: Anticipated discharge following CABG   Code Status: Full Code Diet: HH/Carb Modified  DVT PPx: SCD's 2/2 GI bleeding  IVF: none  Rehman, Areeg N, DO 03/31/2020, 2:05 PM Pager: [phone] After 5pm on weekdays and 1pm on weekends: On Call pager  [phone]

## 2020-03-31 NOTE — Progress Notes (Signed)
Pt c/o 4/10 CP. Sts "its not that bad". Notified RN who came and accessed. Will hold ambulation. Discussed IS (1250 mL), sternal precautions, mobility post op, and d/c planning. Pt voiced understanding but is generally overwhelmed. She lives alone but will discuss with her family if they can rotate staying with her at d/c. She has limited mobility at baseline due to back issues. Ambulates in house with rollator or cane. Gave OHS booklet and careguide. Pt education video system not working at this time. [phone] Heidi Morse CES, ACSM 12:41 PM 03/31/2020

## 2020-03-31 NOTE — Progress Notes (Addendum)
ANTICOAGULATION CONSULT NOTE   Pharmacy Consult for IV Heparin Indication: chest pain/ACS  Allergies  Allergen Reactions  . Iodine Other (See Comments)    unk reaction per pt  . Peanut-Containing Drug Products Hives and Swelling  . Statins Other (See Comments)    myalgia    Patient Measurements: Height: 5' (152.4 cm) Weight: 101.2 kg (223 lb 1.7 oz) IBW/kg (Calculated) : 45.5 Heparin Dosing Weight: 70 kg  Vital Signs: Temp: 98.6 F (37 C) (12/31 1142) Temp Source: Oral (12/31 1142) BP: 137/72 (12/31 1142) Pulse Rate: 87 (12/31 1142)  Labs: Recent Labs    03/29/20 0341 03/30/20 0244 03/31/20 0304 03/31/20 0458 03/31/20 1425  HGB 8.3* 8.3* 8.1*  --   --   HCT 26.7* 29.0* 27.4*  --   --   PLT 258 259 273  --   --   HEPARINUNFRC  --   --  <0.10*  --  <0.10*  CREATININE 0.68 0.63 0.74  --   --   TROPONINIHS  --   --  7 7  --     Estimated Creatinine Clearance: 76 mL/min (by C-G formula based on SCr of 0.74 mg/dL).   Medical History: Past Medical History:  Diagnosis Date  . Asthma   . CHF (congestive heart failure) (HCC)   . COPD (chronic obstructive pulmonary disease) (HCC)   . Diabetes mellitus without complication (HCC)   . Hypertension   . MI (myocardial infarction) (HCC)   . Sleep apnea     Assessment: 64 years of age female with significant cardiac history admitted on 03/28/20 with transient, exertional chest pain now s/p cardiac catheterization 03/30/20 found to have severe two-vessel CAD with unstable angina, moderate aortic stenosis, moderate MR, and LV EF of 45-50% with grade 2 diastolic function. CVTS has been consulted for consideration of surgical revascularization and or valvular surgery once Plavix has cleared (last dose today at 0817AM).  Pharmacy consulted to start IV Heparin therapy.   Note patient has a history of a GI bleed related to small bowel AVMs in the past several years and history of anemia - will need to monitor closely for any signs  or symptoms of bleeding. Hemoglobin stable for admission at 8.3. Platelets are within normal limits.   Heparin level remains undetectable.  Goal of Therapy:  Heparin level 0.3-0.7 units/ml Monitor platelets by anticoagulation protocol: Yes   Plan:  Increase heparin to 1050 units/h Recheck heparin level in 8h   Fredonia Highland, PharmD, [place], [facility] Clinical Pharmacist [phone] Please check AMION for all [facility] Pharmacy numbers 03/31/2020

## 2020-03-31 NOTE — Progress Notes (Signed)
Pt reported chest pain. Nitroglycerin SL given. EKG will be obtained once pt can lay down. MD notified.

## 2020-03-31 NOTE — Progress Notes (Signed)
Pt reported chest pain 4/10. Stated its mild compared to the last night. VS stable, pt refused nitro stating she is fine.MD aware. Will keep monitoring. Informed patient to notify if pain gets worse.

## 2020-03-31 NOTE — Progress Notes (Signed)
Accepted pt from Jasmina, RN. Agree with previous assessment. 

## 2020-03-31 NOTE — Progress Notes (Addendum)
ANTICOAGULATION CONSULT NOTE   Pharmacy Consult for IV Heparin Indication: chest pain/ACS  Allergies  Allergen Reactions  . Iodine Other (See Comments)    unk reaction per pt  . Peanut-Containing Drug Products Hives and Swelling  . Statins Other (See Comments)    myalgia    Patient Measurements: Height: 5' (152.4 cm) Weight: 101.2 kg (223 lb 1.7 oz) IBW/kg (Calculated) : 45.5 Heparin Dosing Weight: 70 kg  Vital Signs: Temp: 97.5 F (36.4 C) (12/31 0455) Temp Source: Oral (12/31 0455) BP: 121/71 (12/31 0455) Pulse Rate: 93 (12/31 0455)  Labs: Recent Labs    03/28/20 0933 03/28/20 2000 03/29/20 0341 03/30/20 0244 03/31/20 0304 03/31/20 0458  HGB  --    < > 8.3* 8.3* 8.1*  --   HCT  --    < > 26.7* 29.0* 27.4*  --   PLT  --    < > 258 259 273  --   HEPARINUNFRC  --   --   --   --  <0.10*  --   CREATININE  --   --  0.68 0.63 0.74  --   TROPONINIHS 6  --   --   --  7 7   < > = values in this interval not displayed.    Estimated Creatinine Clearance: 76 mL/min (by C-G formula based on SCr of 0.74 mg/dL).   Medical History: Past Medical History:  Diagnosis Date  . Asthma   . CHF (congestive heart failure) (HCC)   . COPD (chronic obstructive pulmonary disease) (HCC)   . Diabetes mellitus without complication (HCC)   . Hypertension   . MI (myocardial infarction) (HCC)   . Sleep apnea     Assessment: 64 years of age female with significant cardiac history admitted on 03/28/20 with transient, exertional chest pain now s/p cardiac catheterization 03/30/20 found to have severe two-vessel CAD with unstable angina, moderate aortic stenosis, moderate MR, and LV EF of 45-50% with grade 2 diastolic function. CVTS has been consulted for consideration of surgical revascularization and or valvular surgery once Plavix has cleared (last dose today at 0817AM).  Pharmacy consulted to start IV Heparin therapy.   Note patient has a history of a GI bleed related to small bowel AVMs  in the past several years and history of anemia - will need to monitor closely for any signs or symptoms of bleeding. Hemoglobin stable for admission at 8.3. Platelets are within normal limits.   Right radial sheath was removed at 9:29 AM and TR band placed. TR band removed at 1500 PM. No bleeding noted. Patient reports recent history of black stools requiring GI scans and findings of small bowel AVMs. She is not currently having any bleeding and knows to monitor closely. Discussed IV Heparin start, risk and benefits, and monitoring involved with patient.   12/31 AM update:  Initial heparin level low No issues per RN  Goal of Therapy:  Heparin level 0.3-0.7 units/ml Monitor platelets by anticoagulation protocol: Yes   Plan:  No Heparin Bolus due to recent catheterization and history of GIB.  Inc heparin to 850 units/hr Heparin level in 6-8 hours Monitor daily Heparin level and CBC while on therapy.  Monitor closely for any signs or symptoms of bleeding with GIB history noted.   Abran Duke, PharmD, BCPS Clinical Pharmacist Phone: [phone]

## 2020-03-31 NOTE — Progress Notes (Signed)
Progress Note  Patient Name: Heidi Morse Date of Encounter: 03/31/2020  Primary Cardiologist:   No primary care provider on file.   Subjective   Recurrent bouts of chest pain.   Currently pain free.    Inpatient Medications    Scheduled Meds: . sodium chloride   Intravenous Once  . aspirin EC  81 mg Oral Daily  . atorvastatin  80 mg Oral Daily  . cholecalciferol  2,000 Units Oral Daily  . famotidine  20 mg Oral Daily  . ferrous sulfate  325 mg Oral BID  . furosemide  40 mg Oral Daily  . insulin aspart  0-9 Units Subcutaneous TID WC  . insulin glargine  18 Units Subcutaneous QHS  . isosorbide mononitrate  60 mg Oral Daily  . lisinopril  2.5 mg Oral Daily  . metoprolol succinate  25 mg Oral Daily  . mometasone-formoterol  2 puff Inhalation BID  . montelukast  10 mg Oral Daily  . pantoprazole (PROTONIX) IV  40 mg Intravenous Q24H  . sodium chloride flush  3 mL Intravenous Q12H  . sodium chloride flush  3 mL Intravenous Q12H   Continuous Infusions: . sodium chloride    . heparin 850 Units/hr (03/31/20 0612)   PRN Meds: sodium chloride, acetaminophen **OR** acetaminophen, albuterol, ondansetron **OR** ondansetron (ZOFRAN) IV, sodium chloride flush   Vital Signs    Vitals:   03/31/20 0223 03/31/20 0231 03/31/20 0455 03/31/20 0800  BP: 127/69 109/72 121/71 124/68  Pulse: 98 92 93 87  Resp:   18   Temp:   (!) 97.5 F (36.4 C)   TempSrc:   Oral   SpO2:   100% 100%  Weight:   101.2 kg   Height:        Intake/Output Summary (Last 24 hours) at 03/31/2020 0926 Last data filed at 03/31/2020 0840 Gross per 24 hour  Intake 2512.59 ml  Output 775 ml  Net 1737.59 ml   Filed Weights   03/29/20 0015 03/30/20 0405 03/31/20 0455  Weight: 101.4 kg 100.4 kg 101.2 kg    Telemetry    NSR- Personally Reviewed  ECG    NA - Personally Reviewed  Physical Exam   GEN: No  acute distress.   Neck: No  JVD Cardiac: RRR, no murmurs, rubs, or gallops.  Respiratory:  Clear   to auscultation bilaterally. GI: Soft, nontender, non-distended, normal bowel sounds  MS:  No edema; No deformity. Neuro:   Nonfocal  Psych: Oriented and appropriate    Labs    Chemistry Recent Labs  Lab 03/29/20 0341 03/30/20 0244 03/31/20 0304  NA 141 140 141  K 3.8 3.8 4.0  CL 106 105 111  CO2 25 24 20*  GLUCOSE 124* 127* 201*  BUN 14 17 20   CREATININE 0.68 0.63 0.74  CALCIUM 8.5* 8.9 8.8*  PROT 5.9*  --   --   ALBUMIN 3.0*  --   --   AST 12*  --   --   ALT 12  --   --   ALKPHOS 77  --   --   BILITOT 0.6  --   --   GFRNONAA >60 >60 >60  ANIONGAP 10 11 10      Hematology Recent Labs  Lab 03/29/20 0341 03/30/20 0244 03/31/20 0304  WBC 7.8 8.5 13.3*  RBC 3.19* 3.37* 3.19*  HGB 8.3* 8.3* 8.1*  HCT 26.7* 29.0* 27.4*  MCV 83.7 86.1 85.9  MCH 26.0 24.6* 25.4*  MCHC 31.1 28.6* 29.6*  RDW 18.0* 18.1* 18.2*  PLT 258 259 273    Cardiac EnzymesNo results for input(s): TROPONINI in the last 168 hours. No results for input(s): TROPIPOC in the last 168 hours.   BNPNo results for input(s): BNP, PROBNP in the last 168 hours.   DDimer No results for input(s): DDIMER in the last 168 hours.   Radiology    DG Orthopantogram  Result Date: 03/30/2020 CLINICAL DATA:  Aortic stenosis EXAM: ORTHOPANTOGRAM/PANORAMIC COMPARISON:  None. FINDINGS: The roots of the maxillary incisors are out of focus and not well assessed on this examination. There are multiple missing teeth. There is dental amalgam within the residual left maxillary and right mandibular molars. No periapical abscess identified. Small dental carie noted within the mesial cortex of tooth 3. No mandibular fracture or dislocation. IMPRESSION: Dental carry noted within the mesial cortex of tooth 3. Electronically Signed   By: Helyn Numbers MD   On: 03/30/2020 23:15   CARDIAC CATHETERIZATION  Result Date: 03/30/2020  Prox RCA to Mid RCA lesion is 40% stenosed.  Ost Cx lesion is 99% stenosed.  Dist LM to  Ost LAD lesion is 60% stenosed.  1. Patent left main stent with distal moderate restenosis. The stent extends into the LAD. There is haziness in the ostium of the LAD suggesting at least moderate restenosis of the stent. Another stent extends from the left main into the Circumflex. Severe ostial Circumflex restenosis. Good targets for bypass in the obtuse marginal branches and mid LAD. Recommendations: She has restenosis of the distal left main stented segment, ostial Circumflex stented segment and ostial LAD stented segment. She has diabetes and good targets for bypass. While balloon angioplasty of the severely restenotic segments is possible, there would not be a good long term result with high chance of recurrent restenosis. I have reviewed her images with Dr. Antoine Poche. I think the best option is to consider bypass surgery. She is relatively young and a diabetic. Will ask CT surgery to see her. Also note that she has received Plavix today and has ongoing issues with anemia/GI bleeding. Plavix will be held going forward in anticipation of bypass surgery.   ECHOCARDIOGRAM COMPLETE  Result Date: 03/29/2020    ECHOCARDIOGRAM REPORT   Patient Name:   Heidi Morse Date of Exam: 03/29/2020 Medical Rec #:  [id number]      Height:       60.0 in Accession #:    [id number]     Weight:       223.6 lb Date of Birth:  December 03, 1955      BSA:          1.957 m Patient Age:    64 years       BP:           126/73 mmHg Patient Gender: F              HR:           73 bpm. Exam Location:  Inpatient Procedure: 2D Echo Indications:    Chest Pain  History:        Patient has no prior history of Echocardiogram examinations.                 CHF, Previous Myocardial Infarction, COPD; Risk                 Factors:Hypertension and Diabetes.  Sonographer:    Thurman Coyer RDCS (AE) Referring Phys: [id number] Elwin Mocha RAINES IMPRESSIONS  1. Left ventricular ejection  fraction, by estimation, is 45 to 50%. The left ventricle has mildly  decreased function. The left ventricle demonstrates global hypokinesis. There is mild left ventricular hypertrophy. Left ventricular diastolic parameters are consistent with Grade II diastolic dysfunction (pseudonormalization). Elevated left atrial pressure.  2. Right ventricular systolic function is normal. The right ventricular size is normal. There is normal pulmonary artery systolic pressure. The estimated right ventricular systolic pressure is 32.4 mmHg.  3. The mitral valve is abnormal. Moderate mitral valve regurgitation. Moderate mitral annular calcification.  4. The aortic valve is tricuspid. There is moderate thickening of the aortic valve. Aortic valve regurgitation is mild to moderate. Moderate aortic valve stenosis. Vmax 3.4 m/s, MG 24 mmHg, AVA 0.9 cm^2, DI 0.34  5. The inferior vena cava is normal in size with greater than 50% respiratory variability, suggesting right atrial pressure of 3 mmHg. FINDINGS  Left Ventricle: Left ventricular ejection fraction, by estimation, is 45 to 50%. The left ventricle has mildly decreased function. The left ventricle demonstrates global hypokinesis. The left ventricular internal cavity size was normal in size. There is  mild left ventricular hypertrophy. Left ventricular diastolic parameters are consistent with Grade II diastolic dysfunction (pseudonormalization). Elevated left atrial pressure. Right Ventricle: The right ventricular size is normal. No increase in right ventricular wall thickness. Right ventricular systolic function is normal. There is normal pulmonary artery systolic pressure. The tricuspid regurgitant velocity is 2.71 m/s, and  with an assumed right atrial pressure of 3 mmHg, the estimated right ventricular systolic pressure is 32.4 mmHg. Left Atrium: Left atrial size was normal in size. Right Atrium: Right atrial size was normal in size. Pericardium: There is no evidence of pericardial effusion. Mitral Valve: The mitral valve is abnormal. Moderate  mitral annular calcification. Moderate mitral valve regurgitation. Tricuspid Valve: The tricuspid valve is normal in structure. Tricuspid valve regurgitation is trivial. Aortic Valve: The aortic valve is tricuspid. There is moderate thickening of the aortic valve. Aortic valve regurgitation is mild to moderate. Aortic regurgitation PHT measures 255 msec. Moderate aortic stenosis is present. Aortic valve mean gradient measures 22.5 mmHg. Aortic valve peak gradient measures 44.2 mmHg. Aortic valve area, by VTI measures 0.93 cm. Pulmonic Valve: The pulmonic valve was not well visualized. Pulmonic valve regurgitation is trivial. Aorta: The aortic root and ascending aorta are structurally normal, with no evidence of dilitation. Venous: The inferior vena cava is normal in size with greater than 50% respiratory variability, suggesting right atrial pressure of 3 mmHg. IAS/Shunts: The interatrial septum was not well visualized.  LEFT VENTRICLE PLAX 2D LVIDd:         5.60 cm  Diastology LVIDs:         3.90 cm  LV e' medial:    5.44 cm/s LV PW:         1.10 cm  LV E/e' medial:  25.0 LV IVS:        1.10 cm  LV e' lateral:   4.46 cm/s LVOT diam:     1.80 cm  LV E/e' lateral: 30.5 LV SV:         65 LV SV Index:   33 LVOT Area:     2.54 cm  LEFT ATRIUM             Index       RIGHT ATRIUM           Index LA diam:        4.50 cm 2.30 cm/m  RA Area:  16.10 cm LA Vol (A2C):   83.3 ml 42.56 ml/m RA Volume:   38.70 ml  19.77 ml/m LA Vol (A4C):   21.4 ml 10.93 ml/m LA Biplane Vol: 44.7 ml 22.84 ml/m  AORTIC VALVE AV Area (Vmax):    0.83 cm AV Area (Vmean):   0.94 cm AV Area (VTI):     0.93 cm AV Vmax:           332.50 cm/s AV Vmean:          216.000 cm/s AV VTI:            0.704 m AV Peak Grad:      44.2 mmHg AV Mean Grad:      22.5 mmHg LVOT Vmax:         109.00 cm/s LVOT Vmean:        79.400 cm/s LVOT VTI:          0.256 m LVOT/AV VTI ratio: 0.36 AI PHT:            255 msec  AORTA Ao Root diam: 2.20 cm MITRAL VALVE                  TRICUSPID VALVE MV Area (PHT): 3.72 cm      TR Peak grad:   29.4 mmHg MV Decel Time: 204 msec      TR Vmax:        271.00 cm/s MR Peak grad:    114.1 mmHg MR Mean grad:    72.0 mmHg   SHUNTS MR Vmax:         534.00 cm/s Systemic VTI:  0.26 m MR Vmean:        398.0 cm/s  Systemic Diam: 1.80 cm MR PISA:         2.26 cm MR PISA Eff ROA: 17 mm MR PISA Radius:  0.60 cm MV E velocity: 136.00 cm/s MV A velocity: 110.00 cm/s MV E/A ratio:  1.24 Epifanio Lesches MD Electronically signed by Epifanio Lesches MD Signature Date/Time: 03/29/2020/10:08:09 PM    Final     Cardiac Studies   Echo  1. Left ventricular ejection fraction, by estimation, is 45 to 50%. The  left ventricle has mildly decreased function. The left ventricle  demonstrates global hypokinesis. There is mild left ventricular  hypertrophy. Left ventricular diastolic parameters  are consistent with Grade II diastolic dysfunction (pseudonormalization).  Elevated left atrial pressure.  2. Right ventricular systolic function is normal. The right ventricular  size is normal. There is normal pulmonary artery systolic pressure. The  estimated right ventricular systolic pressure is 32.4 mmHg.  3. The mitral valve is abnormal. Moderate mitral valve regurgitation.  Moderate mitral annular calcification.  4. The aortic valve is tricuspid. There is moderate thickening of the  aortic valve. Aortic valve regurgitation is mild to moderate. Moderate  aortic valve stenosis. Vmax 3.4 m/s, MG 24 mmHg, AVA 0.9 cm^2, DI 0.34  5. The inferior vena cava is normal in size with greater than 50%  respiratory variability, suggesting right atrial pressure of 3 mmHg.   Patient Profile     64 y.o. female  with a hx of bilateral carotid artery stenosis s/p stent placement, chronic systolic CHF, CAD s/p stenting to left main, Ost Cx and Ost LAD, HTN, ischemic CM, nonrheumatic arotic valve stenosis, DM2, PAD s/p stenting of the left common iliac  artery, chronic pain and obesity who is being seen for the evaluation of chest pain and CHF at the request  of Dr. Sandre Kitty.  Assessment & Plan    CHEST PAIN:   On heparin.  Plavix stopped.  Continue ASA.  Can start NTG paste or IV if she has recurrent pain.  CABG on Monday  CHRONIC SYSTOLIC HF:  EF is 45% on echo.  Euvolemic.    AS:  Case reviewed by Dr. Cliffton Asters.  I agree that it is a heavily calcified valve and it is very reasonable to proceed with SAVR at the time of CABG.    MR:  Moderate.  Medical management.    DM:  Per primary team  ANEMIA:  This has been chronic and stable.  She might need another GI procedure at an outside facility for deep enterescopy.    For questions or updates, please contact [facility] Please consult [email or web address] for contact info under Cardiology/STEMI.   Signed, Rollene Rotunda, MD  03/31/2020, 9:26 AM

## 2020-03-31 NOTE — Progress Notes (Signed)
0106 Chest pain 10/10 BP- 137/75 HR-100 SL nitro given  0115 Chest pain 7/10 BP-117/69 HR-94 SL nitro given  0117 Chest pain 5/10 BP 122/62 HR 96 Pt did want any more nitro

## 2020-04-01 ENCOUNTER — Inpatient Hospital Stay (HOSPITAL_COMMUNITY): Payer: Medicaid Other

## 2020-04-01 DIAGNOSIS — Z01810 Encounter for preprocedural cardiovascular examination: Secondary | ICD-10-CM

## 2020-04-01 DIAGNOSIS — I350 Nonrheumatic aortic (valve) stenosis: Secondary | ICD-10-CM | POA: Diagnosis not present

## 2020-04-01 DIAGNOSIS — I200 Unstable angina: Secondary | ICD-10-CM | POA: Diagnosis not present

## 2020-04-01 LAB — RENAL FUNCTION PANEL
Albumin: 2.9 g/dL — ABNORMAL LOW (ref 3.5–5.0)
Anion gap: 9 (ref 5–15)
BUN: 15 mg/dL (ref 8–23)
CO2: 24 mmol/L (ref 22–32)
Calcium: 8.5 mg/dL — ABNORMAL LOW (ref 8.9–10.3)
Chloride: 110 mmol/L (ref 98–111)
Creatinine, Ser: 0.7 mg/dL (ref 0.44–1.00)
GFR, Estimated: >60 mL/min (ref >60)
Glucose, Bld: 114 mg/dL — ABNORMAL HIGH (ref 70–99)
Phosphorus: 3.3 mg/dL (ref 2.5–4.6)
Potassium: 3.8 mmol/L (ref 3.5–5.1)
Sodium: 143 mmol/L (ref 135–145)

## 2020-04-01 LAB — PREPARE RBC (CROSSMATCH)

## 2020-04-01 LAB — CBC
HCT: 24.6 % — ABNORMAL LOW (ref 36.0–46.0)
Hemoglobin: 7.6 g/dL — ABNORMAL LOW (ref 12.0–15.0)
MCH: 26.2 pg (ref 26.0–34.0)
MCHC: 30.9 g/dL (ref 30.0–36.0)
MCV: 84.8 fL (ref 80.0–100.0)
Platelets: 247 10*3/uL (ref 150–400)
RBC: 2.9 MIL/uL — ABNORMAL LOW (ref 3.87–5.11)
RDW: 18.8 % — ABNORMAL HIGH (ref 11.5–15.5)
WBC: 11.4 10*3/uL — ABNORMAL HIGH (ref 4.0–10.5)
nRBC: 0.2 % (ref 0.0–0.2)

## 2020-04-01 LAB — GLUCOSE, CAPILLARY
Glucose-Capillary: 119 mg/dL — ABNORMAL HIGH (ref 70–99)
Glucose-Capillary: 127 mg/dL — ABNORMAL HIGH (ref 70–99)
Glucose-Capillary: 103 mg/dL — ABNORMAL HIGH (ref 70–99)
Glucose-Capillary: 140 mg/dL — ABNORMAL HIGH (ref 70–99)

## 2020-04-01 LAB — HEPARIN LEVEL (UNFRACTIONATED)
Heparin Unfractionated: 0.16 IU/mL — ABNORMAL LOW (ref 0.30–0.70)
Heparin Unfractionated: 0.28 IU/mL — ABNORMAL LOW (ref 0.30–0.70)
Heparin Unfractionated: 0.32 IU/mL (ref 0.30–0.70)

## 2020-04-01 LAB — HEMOGLOBIN AND HEMATOCRIT, BLOOD
HCT: 28.6 % — ABNORMAL LOW (ref 36.0–46.0)
Hemoglobin: 8.6 g/dL — ABNORMAL LOW (ref 12.0–15.0)

## 2020-04-01 MED ORDER — SODIUM CHLORIDE 0.9% IV SOLUTION: Freq: Once | INTRAVENOUS | Status: DC

## 2020-04-01 MED ORDER — METOPROLOL SUCCINATE ER 25 MG PO TB24
25.0000 mg | ORAL_TABLET | Freq: Once | ORAL | Status: AC
  Administered 2020-04-01: 25 mg via ORAL
  Filled 2020-04-01: qty 1

## 2020-04-01 MED ORDER — METOPROLOL SUCCINATE ER 50 MG PO TB24
50.0000 mg | ORAL_TABLET | Freq: Every day | ORAL | Status: DC
  Administered 2020-04-02: 50 mg via ORAL
  Filled 2020-04-01: qty 1

## 2020-04-01 MED ORDER — GUAIFENESIN-DM 100-10 MG/5ML PO SYRP
5.0000 mL | ORAL_SOLUTION | ORAL | Status: DC | PRN
  Administered 2020-04-01 – 2020-04-03 (×3): 5 mL via ORAL
  Filled 2020-04-01 (×4): qty 5

## 2020-04-01 NOTE — Progress Notes (Addendum)
Patient had no further chest pain after being seen yesterday. She is scheduled for OR on Monday 04/03/2020 with Dr. Cliffton Asters.  Agree with above OR on Monday  Adal Sereno O Myleigh Amara

## 2020-04-01 NOTE — Progress Notes (Signed)
Pre-AVR testing has been completed. Preliminary results can be found in CV Proc through chart review.   04/01/20 10:42 AM Olen Cordial RVT

## 2020-04-01 NOTE — Progress Notes (Signed)
MD notified of 25 beat run of VTach.  Patient asymptomatic and ekg performed showing NSR.  No chest pain or palpitations at time of incident.  No new orders at this time.

## 2020-04-01 NOTE — Progress Notes (Signed)
Patient refused the use of CPAP for the evening.  

## 2020-04-01 NOTE — Progress Notes (Signed)
Progress Note  Patient Name: Heidi Morse Date of Encounter: 04/01/2020  Primary Cardiologist: Rollene Rotunda, MD  Subjective   No active angina or shortness of breath.  Inpatient Medications    Scheduled Meds: . sodium chloride   Intravenous Once  . sodium chloride   Intravenous Once  . aspirin EC  81 mg Oral Daily  . atorvastatin  80 mg Oral Daily  . cholecalciferol  2,000 Units Oral Daily  . famotidine  20 mg Oral Daily  . ferrous sulfate  325 mg Oral BID  . furosemide  40 mg Oral Daily  . insulin aspart  0-9 Units Subcutaneous TID WC  . insulin glargine  18 Units Subcutaneous QHS  . isosorbide mononitrate  60 mg Oral Daily  . lisinopril  2.5 mg Oral Daily  . metoprolol succinate  25 mg Oral Daily  . mometasone-formoterol  2 puff Inhalation BID  . montelukast  10 mg Oral Daily  . pantoprazole (PROTONIX) IV  40 mg Intravenous Q24H  . sodium chloride flush  3 mL Intravenous Q12H  . sodium chloride flush  3 mL Intravenous Q12H   Continuous Infusions: . sodium chloride    . heparin 1,200 Units/hr (04/01/20 0513)   PRN Meds: sodium chloride, acetaminophen **OR** acetaminophen, albuterol, ondansetron **OR** ondansetron (ZOFRAN) IV, sodium chloride flush   Vital Signs    Vitals:   03/31/20 2102 03/31/20 2145 04/01/20 0500 04/01/20 0737  BP: 113/70  122/72   Pulse: 86 64 93   Resp: 17 16 16    Temp: 98.3 F (36.8 C)  98.7 F (37.1 C)   TempSrc: Oral  Oral   SpO2: 98% 94% 99% 99%  Weight:   101.8 kg   Height:        Intake/Output Summary (Last 24 hours) at 04/01/2020 0942 Last data filed at 03/31/2020 2113 Gross per 24 hour  Intake 843.2 ml  Output 1050 ml  Net -206.8 ml   Filed Weights   03/30/20 0405 03/31/20 0455 04/01/20 0500  Weight: 100.4 kg 101.2 kg 101.8 kg    Telemetry    Sinus rhythm, burst of NSVT noted.  Personally reviewed.  ECG    An ECG dated 03/31/2020 was personally reviewed today and demonstrated:  Normal sinus rhythm.  Physical  Exam   GEN: No acute distress.   Neck: No JVD. Cardiac: RRR, 2-3/6 murmur, rub, or gallop.  Respiratory: Nonlabored. Clear to auscultation bilaterally. GI: Soft, nontender, bowel sounds present. MS: No edema; No deformity. Neuro:  Nonfocal. Psych: Alert and oriented x 3. Normal affect.  Labs    Chemistry Recent Labs  Lab 03/29/20 0341 03/30/20 0244 03/31/20 0304 04/01/20 0236  NA 141 140 141 143  K 3.8 3.8 4.0 3.8  CL 106 105 111 110  CO2 25 24 20* 24  GLUCOSE 124* 127* 201* 114*  BUN 14 17 20 15   CREATININE 0.68 0.63 0.74 0.70  CALCIUM 8.5* 8.9 8.8* 8.5*  PROT 5.9*  --   --   --   ALBUMIN 3.0*  --   --  2.9*  AST 12*  --   --   --   ALT 12  --   --   --   ALKPHOS 77  --   --   --   BILITOT 0.6  --   --   --   GFRNONAA >60 >60 >60 >60  ANIONGAP 10 11 10 9      Hematology Recent Labs  Lab 03/30/20 0244 03/31/20 0304  04/01/20 0222  WBC 8.5 13.3* 11.4*  RBC 3.37* 3.19* 2.90*  HGB 8.3* 8.1* 7.6*  HCT 29.0* 27.4* 24.6*  MCV 86.1 85.9 84.8  MCH 24.6* 25.4* 26.2  MCHC 28.6* 29.6* 30.9  RDW 18.1* 18.2* 18.8*  PLT 259 273 247    Cardiac Enzymes Recent Labs  Lab 03/28/20 0739 03/28/20 0933 03/31/20 0304 03/31/20 0458  TROPONINIHS 5 6 7 7     Radiology    DG Orthopantogram  Result Date: 03/30/2020 CLINICAL DATA:  Aortic stenosis EXAM: ORTHOPANTOGRAM/PANORAMIC COMPARISON:  None. FINDINGS: The roots of the maxillary incisors are out of focus and not well assessed on this examination. There are multiple missing teeth. There is dental amalgam within the residual left maxillary and right mandibular molars. No periapical abscess identified. Small dental carie noted within the mesial cortex of tooth 3. No mandibular fracture or dislocation. IMPRESSION: Dental carry noted within the mesial cortex of tooth 3. Electronically Signed   By: 04/01/2020 MD   On: 03/30/2020 23:15    Cardiac Studies   Echocardiogram 03/29/2020: 1. Left ventricular ejection fraction,  by estimation, is 45 to 50%. The  left ventricle has mildly decreased function. The left ventricle  demonstrates global hypokinesis. There is mild left ventricular  hypertrophy. Left ventricular diastolic parameters  are consistent with Grade II diastolic dysfunction (pseudonormalization).  Elevated left atrial pressure.  2. Right ventricular systolic function is normal. The right ventricular  size is normal. There is normal pulmonary artery systolic pressure. The  estimated right ventricular systolic pressure is 32.4 mmHg.  3. The mitral valve is abnormal. Moderate mitral valve regurgitation.  Moderate mitral annular calcification.  4. The aortic valve is tricuspid. There is moderate thickening of the  aortic valve. Aortic valve regurgitation is mild to moderate. Moderate  aortic valve stenosis. Vmax 3.4 m/s, MG 24 mmHg, AVA 0.9 cm^2, DI 0.34  5. The inferior vena cava is normal in size with greater than 50%  respiratory variability, suggesting right atrial pressure of 3 mmHg.   Cardiac catheterization 03/30/2020:  Prox RCA to Mid RCA lesion is 40% stenosed.  Ost Cx lesion is 99% stenosed.  Dist LM to Ost LAD lesion is 60% stenosed.   1. Patent left main stent with distal moderate restenosis. The stent extends into the LAD. There is haziness in the ostium of the LAD suggesting at least moderate restenosis of the stent. Another stent extends from the left main into the Circumflex. Severe ostial Circumflex restenosis. Good targets for bypass in the obtuse marginal branches and mid LAD.   Recommendations: She has restenosis of the distal left main stented segment, ostial Circumflex stented segment and ostial LAD stented segment. She has diabetes and good targets for bypass. While balloon angioplasty of the severely restenotic segments is possible, there would not be a good long term result with high chance of recurrent restenosis. I have reviewed her images with Dr. 04/01/2020. I think  the best option is to consider bypass surgery. She is relatively young and a diabetic. Will ask CT surgery to see her. Also note that she has received Plavix today and has ongoing issues with anemia/GI bleeding. Plavix will be held going forward in anticipation of bypass surgery.   Patient Profile     65 y.o. female with a history of bilateral carotid artery stenosis s/p stent placement, chronic systolic CHF, CAD s/p stenting to left main, Ost Cx and Ost LAD, HTN, ischemic CM, nonrheumatic arotic valve stenosis, DM2, PAD s/p stenting of  the left common iliac artery, chronic pain and obesitywho is being seen for the evaluation ofchest pain and CHF  Assessment & Plan    1.  Unstable angina, currently chest pain-free on medical therapy.  2.  Obstructive CAD with evidence of in-stent restenosis involving the left main and ostium of the LAD and circumflex.  Patient is scheduled for CABG on Monday morning.  3.  Moderate calcific aortic stenosis with mild to moderate aortic regurgitation.  Mean gradient 24 mmHg with dimensionless index 0.34.  Anticipate AVR with CABG.  4.  Ischemic cardiomyopathy with LVEF 45 to 50%, normal RV contraction.  5.  Moderate mitral regurgitation.  6.  Acute on chronic anemia.  History of iron deficiency anemia and previously documented gastrointestinal AVMs.  Continue aspirin, Lipitor, Lasix, Imdur, lisinopril, Toprol-XL, and heparin.  Anticipate CABG with AVR on Monday morning.  Signed, Rozann Lesches, MD  04/01/2020, 9:42 AM

## 2020-04-01 NOTE — Progress Notes (Addendum)
Subjective:   Heidi Morse had 25 beats of VT but was asymptomatic during this episode overnight. No further episodes this morning. When we walked in to speak with her this morning, she was very tearful. She says "it is all too much" after hearing she would require another blood transfusion and an IJ catheter prior to her CABG. She is nervous regarding the procedure. She states that she did not have any CP since yesterday. She fell asleep without her CPAP and did not use her mask overnight. She had some dizziness this morning, although otherwise denies palpitations or LE swelling. Denies any abdominal pain, rectal pain, increase in frequency in stools, or extra dark/bloody bowel movements.    Objective:  Vital signs in last 24 hours: Vitals:   03/31/20 1142 03/31/20 2102 03/31/20 2145 04/01/20 0500  BP: 137/72 113/70  122/72  Pulse: 87 86 64 93  Resp: 18 17 16 16   Temp: 98.6 F (37 C) 98.3 F (36.8 C)  98.7 F (37.1 C)  TempSrc: Oral Oral  Oral  SpO2: 99% 98% 94% 99%  Weight:    101.8 kg  Height:       General: Patient is mobidly obese. Appears well, visibly upset although in no acute distress.  Respiratory: Lungs are CTA, bilaterally. No wheezes, rales, or rhonchi.  Cardiovascular: Rate is normal, rhythm is regular. 2/6 LUSB and 3/6 R upper chest holosystolic murmurs. No lower extremity edema.  Gastrointestinal: Abdomen is soft, without distention or tenderness to palpation. Bowel sounds intact. External rectal examination shows large hemorrhoids with no lesions. DRE without masses, fissures, palpable internal hemorrhoids, with dark stool although no gross blood. Neurological: Alert and oriented. Psych: Anxious and tearful.   Assessment/Plan:  Principal Problem:   Unstable angina (HCC) Active Problems:   Symptomatic anemia   Aortic stenosis  #Unstable Angina  # NSVT Patient has not had any more episodes of chest pain overnight. Did have 25 beats asymptomatic VT overnight.  Plan is to proceed with CABG and likely AVR on Monday, January 3. She lost IV access and the IV team was unable to replace after multiple attempts today, so will need central line for heparin and blood transfusion. - Cardiology and CT surgery on board, appreciate their recommendations  - Increased home Toprol-XL to 50mg  daily  - Notified patient and RN to notify 03-28-1976 if CP continues/recurs to try NTG paste vs. IV NTG - Continue home lipitor   # Acute on Chronic Normocytic Anemia likely 2/2 slow GI Bleed # Chronic iron deficiency anemia Hemoglobin decreased again from 8.1 to 7.6. Ordered another 1 unit PRBC. She is status post 1 unit PRBC and IV Feraheme on admission. No signs of active bleeding on exam. Recent history of gastrointestinal AVMs status post ablation, most recently in November 2021 and a capsule endoscopy 03/21/2020 that showed recurrent AVM bleeding. Despite hemoglobin drop < 8, likely very slow bleed given no gross blood on DRE or increase in stool frequency. On heparin and ASA due to unstable angina. - Continue to monitor for S&S of worsening bleeding  - Continue to monitor daily CBC  - Will give 1 unit RBC's - F/u post-transfusion H&H, goal hgb is > 8g due to active ischemic heart disease - Continue PO iron supplementation.  - Continue heparin and ASA unless concern bleeding becomes more brisk   # Acute on Chronic Combined CHF  # Aortic Stenosis, Moderate  # Mitral Regurgitation, Moderate Per cardiology, patient will likely receive AVR at the  time of CABG due to heavy calcified aortic valve. Mitral regurgitation is moderate and plan is to continue medical management.  CABG planned for Monday.  - Continue Lasix 40mg  PO daily for now - Strict I&O - Check daily weights  # Hx of Hypertension Blood pressures stable in 1-teens-120's systolic. Will continue to hold amlodipine for now given relatively softer pressures with AS. - Continue to hold amlodipine  - Continue home Imdur,  lisinopril - Increased Toprol-XL to 50mg  daily    # Asthma # COPD # OSA Patient continues not to wear CPAP at night. - Encourage CPAP at night - Continue home medications   # IDDM Type II Takes 24 units Lantus twice daily as needed at home. CBG's now stable on Lantus 18 units nightly. -Continue Lantus 18 units nightly -Continue SSI -CBG monitoring  Prior to Admission Living Arrangement: Home Anticipated Discharge Location: SNF following CABG  Barriers to Discharge: CABG and AVR Monday  Dispo: Anticipated discharge following CABG   Code Status: Full Code Diet: HH/Carb Modified  DVT PPx: On Heparin infusion  IVF: none  , MD 04/01/2020, 7:19 AM Pager: [phone] After 5pm on weekdays and 1pm on weekends: On Call pager [phone]

## 2020-04-01 NOTE — Progress Notes (Signed)
ANTICOAGULATION CONSULT NOTE   Pharmacy Consult for IV Heparin Indication: chest pain/ACS  Allergies  Allergen Reactions  . Iodine Other (See Comments)    unk reaction per pt  . Peanut-Containing Drug Products Hives and Swelling  . Statins Other (See Comments)    myalgia    Patient Measurements: Height: 5' (152.4 cm) Weight: 101.8 kg (224 lb 6.4 oz) IBW/kg (Calculated) : 45.5 Heparin Dosing Weight: 70 kg  Vital Signs: Temp: 98.6 F (37 C) (01/01 1530) Temp Source: Oral (01/01 1530) BP: 116/57 (01/01 1530) Pulse Rate: 86 (01/01 1530)  Labs: Recent Labs    03/30/20 0244 03/31/20 0304 03/31/20 0458 03/31/20 1425 04/01/20 0017 04/01/20 0222 04/01/20 0236 04/01/20 1102 04/01/20 1917  HGB 8.3* 8.1*  --   --   --  7.6*  --   --  8.6*  HCT 29.0* 27.4*  --   --   --  24.6*  --   --  28.6*  PLT 259 273  --   --   --  247  --   --   --   HEPARINUNFRC  --  <0.10*  --    < > 0.16*  --   --  0.28* 0.32  CREATININE 0.63 0.74  --   --   --   --  0.70  --   --   TROPONINIHS  --  7 7  --   --   --   --   --   --    < > = values in this interval not displayed.    Estimated Creatinine Clearance: 76.3 mL/min (by C-G formula based on SCr of 0.7 mg/dL).   Medical History: Past Medical History:  Diagnosis Date  . Asthma   . CHF (congestive heart failure) (HCC)   . COPD (chronic obstructive pulmonary disease) (HCC)   . Diabetes mellitus without complication (HCC)   . Hypertension   . MI (myocardial infarction) (HCC)   . Sleep apnea     Assessment: 65 years of age female with significant cardiac history admitted on 03/28/20 with transient, exertional chest pain now s/p cardiac catheterization 03/30/20 found to have severe two-vessel CAD with unstable angina, moderate aortic stenosis, moderate MR, and LV EF of 45-50% with grade 2 diastolic function. CVTS has been consulted for consideration of surgical revascularization and or valvular surgery once Plavix has cleared (last dose  today at 0817AM).  Pharmacy consulted to start IV Heparin therapy.   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 Patient reports recent history of black stools requiring GI scans and findings of small bowel AVMs. She is not currently having any bleeding and knows to monitor closely. Discussed IV Heparin start, risk and benefits, and monitoring involved with patient.   -heparin level at goal on 1300 units/hr   Goal of Therapy:  Heparin level 0.3-0.7 units/ml Monitor platelets by anticoagulation protocol: Yes   Plan:  Continue heparin at 1300 units/hr Monitor daily Heparin level and CBC while on therapy.     Harland German, PharmD Clinical Pharmacist **Pharmacist phone directory can now be found on [email or web address] ([email or web address]).  Listed under [facility].

## 2020-04-01 NOTE — Progress Notes (Signed)
ANTICOAGULATION CONSULT NOTE   Pharmacy Consult for IV Heparin Indication: chest pain/ACS  Allergies  Allergen Reactions  . Iodine Other (See Comments)    unk reaction per pt  . Peanut-Containing Drug Products Hives and Swelling  . Statins Other (See Comments)    myalgia    Patient Measurements: Height: 5' (152.4 cm) Weight: 101.8 kg (224 lb 6.4 oz) IBW/kg (Calculated) : 45.5 Heparin Dosing Weight: 70 kg  Vital Signs: Temp: 98.7 F (37.1 C) (01/01 0500) Temp Source: Oral (01/01 0500) BP: 122/72 (01/01 0500) Pulse Rate: 93 (01/01 0500)  Labs: Recent Labs    03/30/20 0244 03/30/20 0244 03/31/20 0304 03/31/20 0458 03/31/20 1425 04/01/20 0017 04/01/20 0222 04/01/20 0236 04/01/20 1102  HGB 8.3*  --  8.1*  --   --   --  7.6*  --   --   HCT 29.0*  --  27.4*  --   --   --  24.6*  --   --   PLT 259  --  273  --   --   --  247  --   --   HEPARINUNFRC  --    < > <0.10*  --  <0.10* 0.16*  --   --  0.28*  CREATININE 0.63  --  0.74  --   --   --   --  0.70  --   TROPONINIHS  --   --  7 7  --   --   --   --   --    < > = values in this interval not displayed.    Estimated Creatinine Clearance: 76.3 mL/min (by C-G formula based on SCr of 0.7 mg/dL).   Medical History: Past Medical History:  Diagnosis Date  . Asthma   . CHF (congestive heart failure) (HCC)   . COPD (chronic obstructive pulmonary disease) (HCC)   . Diabetes mellitus without complication (HCC)   . Hypertension   . MI (myocardial infarction) (HCC)   . Sleep apnea     Assessment: 65 years of age Morse with significant cardiac history admitted on 03/28/20 with transient, exertional chest pain now s/p cardiac catheterization 03/30/20 found to have severe two-vessel CAD with unstable angina, moderate aortic stenosis, moderate MR, and LV EF of 45-50% with grade 2 diastolic function. CVTS has been consulted for consideration of surgical revascularization and or valvular surgery once Plavix has cleared (last  dose today at 0817AM).  Pharmacy consulted to start IV Heparin therapy.   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 Patient reports recent history of black stools requiring GI scans and findings of small bowel AVMs. She is not currently having any bleeding and knows to monitor closely. Discussed IV Heparin start, risk and benefits, and monitoring involved with patient.   Heparin level is slightly subtherapeutic at 0.29 on 1200 units/hr. Hgb low requiring blood transfusion but okay to continue heparin per MD, PLTs stable. Planning for CABG 1/3. No issues with bleeding or infusion per RN.   Goal of Therapy:  Heparin level 0.3-0.7 units/ml Monitor platelets by anticoagulation protocol: Yes   Plan:  No Heparin Bolus due to recent catheterization and history of GIB.  Inc heparin to 1300 units/hr Heparin level in 6-8 hours Monitor daily Heparin level and CBC while on therapy.  Monitor closely for any signs or  symptoms of bleeding with GIB history noted.   Kinnie Feil, PharmD PGY1 Acute Care Pharmacy Resident Phone: [phone] 04/01/2020 12:17 PM  Please check [email or web address] for unit specific pharmacy phone numbers.

## 2020-04-01 NOTE — Progress Notes (Signed)
ANTICOAGULATION CONSULT NOTE   Pharmacy Consult for IV Heparin Indication: chest pain/ACS  Allergies  Allergen Reactions  . Iodine Other (See Comments)    unk reaction per pt  . Peanut-Containing Drug Products Hives and Swelling  . Statins Other (See Comments)    myalgia    Patient Measurements: Height: 5' (152.4 cm) Weight: 101.2 kg (223 lb 1.7 oz) IBW/kg (Calculated) : 45.5 Heparin Dosing Weight: 70 kg  Vital Signs: Temp: 98.3 F (36.8 C) (12/31 2102) Temp Source: Oral (12/31 2102) BP: 113/70 (12/31 2102) Pulse Rate: 64 (12/31 2145)  Labs: Recent Labs    03/29/20 0341 03/30/20 0244 03/31/20 0304 03/31/20 0458 03/31/20 1425 04/01/20 0017  HGB 8.3* 8.3* 8.1*  --   --   --   HCT 26.7* 29.0* 27.4*  --   --   --   PLT 258 259 273  --   --   --   HEPARINUNFRC  --   --  <0.10*  --  <0.10* 0.16*  CREATININE 0.68 0.63 0.74  --   --   --   TROPONINIHS  --   --  7 7  --   --     Estimated Creatinine Clearance: 76 mL/min (by C-G formula based on SCr of 0.74 mg/dL).   Medical History: Past Medical History:  Diagnosis Date  . Asthma   . CHF (congestive heart failure) (HCC)   . COPD (chronic obstructive pulmonary disease) (HCC)   . Diabetes mellitus without complication (HCC)   . Hypertension   . MI (myocardial infarction) (HCC)   . Sleep apnea     Assessment: 65 years of age female with significant cardiac history admitted on 03/28/20 with transient, exertional chest pain now s/p cardiac catheterization 03/30/20 found to have severe two-vessel CAD with unstable angina, moderate aortic stenosis, moderate MR, and LV EF of 45-50% with grade 2 diastolic function. CVTS has been consulted for consideration of surgical revascularization and or valvular surgery once Plavix has cleared (last dose today at 0817AM).  Pharmacy consulted to start IV Heparin therapy.   Note patient has a history of a GI bleed related to small bowel AVMs in the past several years and history of  anemia - will need to monitor closely for any signs or symptoms of bleeding. Hemoglobin stable for admission at 8.3. Platelets are within normal limits.   Right radial sheath was removed at 9:29 AM and TR band placed. TR band removed at 1500 PM. No bleeding noted. Patient reports recent history of black stools requiring GI scans and findings of small bowel AVMs. She is not currently having any bleeding and knows to monitor closely. Discussed IV Heparin start, risk and benefits, and monitoring involved with patient.   1/1 AM update:  Heparin level low but trending up  Goal of Therapy:  Heparin level 0.3-0.7 units/ml Monitor platelets by anticoagulation protocol: Yes   Plan:  No Heparin Bolus due to recent catheterization and history of GIB.  Inc heparin to 1200 units/hr Heparin level in 6-8 hours Monitor daily Heparin level and CBC while on therapy.  Monitor closely for any signs or symptoms of bleeding with GIB history noted.   Abran Duke, PharmD, BCPS Clinical Pharmacist Phone: [phone]

## 2020-04-01 NOTE — Progress Notes (Signed)
Pt not tolerating PIV access. Stopped procedure and spoke with primary RN. Stated the pt is to have a CABG and will need central access. RN to speak with the MD regarding the need for a PIV and follow up accordingly.

## 2020-04-02 DIAGNOSIS — D649 Anemia, unspecified: Secondary | ICD-10-CM | POA: Diagnosis not present

## 2020-04-02 DIAGNOSIS — R079 Chest pain, unspecified: Secondary | ICD-10-CM | POA: Diagnosis not present

## 2020-04-02 DIAGNOSIS — I10 Essential (primary) hypertension: Secondary | ICD-10-CM | POA: Diagnosis not present

## 2020-04-02 DIAGNOSIS — J45909 Unspecified asthma, uncomplicated: Secondary | ICD-10-CM | POA: Diagnosis not present

## 2020-04-02 LAB — URINALYSIS, ROUTINE W REFLEX MICROSCOPIC
Bilirubin Urine: NEGATIVE
Glucose, UA: NEGATIVE mg/dL
Hgb urine dipstick: NEGATIVE
Ketones, ur: NEGATIVE mg/dL
Leukocytes,Ua: NEGATIVE
Nitrite: NEGATIVE
Protein, ur: NEGATIVE mg/dL
Specific Gravity, Urine: 1.015 (ref 1.005–1.030)
pH: 5 (ref 5.0–8.0)

## 2020-04-02 LAB — BASIC METABOLIC PANEL
Anion gap: 10 (ref 5–15)
BUN: 13 mg/dL (ref 8–23)
CO2: 23 mmol/L (ref 22–32)
Calcium: 8.4 mg/dL — ABNORMAL LOW (ref 8.9–10.3)
Chloride: 110 mmol/L (ref 98–111)
Creatinine, Ser: 0.68 mg/dL (ref 0.44–1.00)
GFR, Estimated: >60 mL/min (ref >60)
Glucose, Bld: 115 mg/dL — ABNORMAL HIGH (ref 70–99)
Potassium: 3.6 mmol/L (ref 3.5–5.1)
Sodium: 143 mmol/L (ref 135–145)

## 2020-04-02 LAB — CBC
HCT: 27.3 % — ABNORMAL LOW (ref 36.0–46.0)
Hemoglobin: 8.6 g/dL — ABNORMAL LOW (ref 12.0–15.0)
MCH: 27 pg (ref 26.0–34.0)
MCHC: 31.5 g/dL (ref 30.0–36.0)
MCV: 85.6 fL (ref 80.0–100.0)
Platelets: 233 10*3/uL (ref 150–400)
RBC: 3.19 MIL/uL — ABNORMAL LOW (ref 3.87–5.11)
RDW: 19.1 % — ABNORMAL HIGH (ref 11.5–15.5)
WBC: 11.6 10*3/uL — ABNORMAL HIGH (ref 4.0–10.5)
nRBC: 0.3 % — ABNORMAL HIGH (ref 0.0–0.2)

## 2020-04-02 LAB — GLUCOSE, CAPILLARY
Glucose-Capillary: 123 mg/dL — ABNORMAL HIGH (ref 70–99)
Glucose-Capillary: 117 mg/dL — ABNORMAL HIGH (ref 70–99)
Glucose-Capillary: 119 mg/dL — ABNORMAL HIGH (ref 70–99)
Glucose-Capillary: 171 mg/dL — ABNORMAL HIGH (ref 70–99)

## 2020-04-02 LAB — PREPARE RBC (CROSSMATCH)

## 2020-04-02 LAB — HEPARIN LEVEL (UNFRACTIONATED): Heparin Unfractionated: 0.35 IU/mL (ref 0.30–0.70)

## 2020-04-02 MED ORDER — TRANEXAMIC ACID (OHS) BOLUS VIA INFUSION
15.0000 mg/kg | INTRAVENOUS | Status: AC
  Administered 2020-04-03: 1536 mg via INTRAVENOUS
  Filled 2020-04-02: qty 1536

## 2020-04-02 MED ORDER — MILRINONE LACTATE IN DEXTROSE 20-5 MG/100ML-% IV SOLN
0.3000 ug/kg/min | INTRAVENOUS | Status: DC
  Filled 2020-04-02: qty 100

## 2020-04-02 MED ORDER — NOREPINEPHRINE 4 MG/250ML-% IV SOLN
0.0000 ug/min | INTRAVENOUS | Status: DC
  Filled 2020-04-02: qty 250

## 2020-04-02 MED ORDER — NITROGLYCERIN IN D5W 200-5 MCG/ML-% IV SOLN
2.0000 ug/min | INTRAVENOUS | Status: DC
  Filled 2020-04-02: qty 250

## 2020-04-02 MED ORDER — INSULIN REGULAR(HUMAN) IN NACL 100-0.9 UT/100ML-% IV SOLN
INTRAVENOUS | Status: AC
  Administered 2020-04-03: 1.5 [IU]/h via INTRAVENOUS
  Filled 2020-04-02: qty 100

## 2020-04-02 MED ORDER — CHLORHEXIDINE GLUCONATE CLOTH 2 % EX PADS
6.0000 | MEDICATED_PAD | Freq: Once | CUTANEOUS | Status: AC
  Administered 2020-04-03: 6 via TOPICAL

## 2020-04-02 MED ORDER — TRANEXAMIC ACID 1000 MG/10ML IV SOLN
1.5000 mg/kg/h | INTRAVENOUS | Status: AC
  Administered 2020-04-03: 1.5 mg/kg/h via INTRAVENOUS
  Filled 2020-04-02: qty 25

## 2020-04-02 MED ORDER — PHENYLEPHRINE HCL-NACL 20-0.9 MG/250ML-% IV SOLN
30.0000 ug/min | INTRAVENOUS | Status: AC
  Administered 2020-04-03: 25 ug/min via INTRAVENOUS
  Filled 2020-04-02: qty 250

## 2020-04-02 MED ORDER — SODIUM CHLORIDE 0.9 % IV SOLN
750.0000 mg | INTRAVENOUS | Status: AC
  Administered 2020-04-03: 750 mg via INTRAVENOUS
  Filled 2020-04-02: qty 750

## 2020-04-02 MED ORDER — MANNITOL 20 % IV SOLN
INTRAVENOUS | Status: DC
  Filled 2020-04-02: qty 13

## 2020-04-02 MED ORDER — TRANEXAMIC ACID (OHS) PUMP PRIME SOLUTION
2.0000 mg/kg | INTRAVENOUS | Status: DC
  Filled 2020-04-02: qty 2.05

## 2020-04-02 MED ORDER — PLASMA-LYTE 148 IV SOLN
INTRAVENOUS | Status: DC
  Filled 2020-04-02: qty 2.5

## 2020-04-02 MED ORDER — VANCOMYCIN HCL 1500 MG/300ML IV SOLN
1500.0000 mg | INTRAVENOUS | Status: AC
  Administered 2020-04-03: 1500 mg via INTRAVENOUS
  Filled 2020-04-02: qty 300

## 2020-04-02 MED ORDER — CHLORHEXIDINE GLUCONATE 0.12 % MT SOLN
15.0000 mL | Freq: Once | OROMUCOSAL | Status: AC
  Administered 2020-04-03: 15 mL via OROMUCOSAL
  Filled 2020-04-02: qty 15

## 2020-04-02 MED ORDER — EPINEPHRINE HCL 5 MG/250ML IV SOLN IN NS
0.0000 ug/min | INTRAVENOUS | Status: DC
  Filled 2020-04-02: qty 250

## 2020-04-02 MED ORDER — BISACODYL 5 MG PO TBEC
5.0000 mg | DELAYED_RELEASE_TABLET | Freq: Once | ORAL | Status: DC
  Filled 2020-04-02: qty 1

## 2020-04-02 MED ORDER — METOPROLOL TARTRATE 12.5 MG HALF TABLET
12.5000 mg | ORAL_TABLET | Freq: Once | ORAL | Status: AC
  Administered 2020-04-03: 12.5 mg via ORAL
  Filled 2020-04-02: qty 1

## 2020-04-02 MED ORDER — SODIUM CHLORIDE 0.9 % IV SOLN
1.5000 g | INTRAVENOUS | Status: AC
  Administered 2020-04-03: 1.5 g via INTRAVENOUS
  Filled 2020-04-02: qty 1.5

## 2020-04-02 MED ORDER — SODIUM CHLORIDE 0.9 % IV SOLN
INTRAVENOUS | Status: DC
  Filled 2020-04-02: qty 30

## 2020-04-02 MED ORDER — POTASSIUM CHLORIDE 2 MEQ/ML IV SOLN
80.0000 meq | INTRAVENOUS | Status: DC
  Filled 2020-04-02: qty 40

## 2020-04-02 MED ORDER — DEXMEDETOMIDINE HCL IN NACL 400 MCG/100ML IV SOLN
0.1000 ug/kg/h | INTRAVENOUS | Status: AC
  Administered 2020-04-03: .3 ug/kg/h via INTRAVENOUS
  Filled 2020-04-02 (×3): qty 100

## 2020-04-02 MED ORDER — TEMAZEPAM 15 MG PO CAPS
15.0000 mg | ORAL_CAPSULE | Freq: Once | ORAL | Status: DC | PRN
  Filled 2020-04-02: qty 1

## 2020-04-02 NOTE — Progress Notes (Signed)
ANTICOAGULATION CONSULT NOTE   Pharmacy Consult for IV Heparin Indication: chest pain/ACS  Allergies  Allergen Reactions  . Iodine Other (See Comments)    unk reaction per pt  . Peanut-Containing Drug Products Hives and Swelling  . Statins Other (See Comments)    myalgia    Patient Measurements: Height: 5' (152.4 cm) Weight: 102.4 kg (225 lb 12.8 oz) IBW/kg (Calculated) : 45.5 Heparin Dosing Weight: 70 kg  Vital Signs: Temp: 98.2 F (36.8 C) (01/02 0111) Temp Source: Oral (01/02 0111) BP: 97/57 (01/02 0111) Pulse Rate: 68 (01/02 0111)  Labs: Recent Labs    03/31/20 0304 03/31/20 0458 03/31/20 1425 04/01/20 0222 04/01/20 0236 04/01/20 1102 04/01/20 1917 04/02/20 0334  HGB 8.1*  --   --  7.6*  --   --  8.6* 8.6*  HCT 27.4*  --   --  24.6*  --   --  28.6* 27.3*  PLT 273  --   --  247  --   --   --  233  HEPARINUNFRC <0.10*  --    < >  --   --  0.28* 0.32 0.35  CREATININE 0.74  --   --   --  0.70  --   --  0.68  TROPONINIHS 7 7  --   --   --   --   --   --    < > = values in this interval not displayed.    Estimated Creatinine Clearance: 76.6 mL/min (by C-G formula based on SCr of 0.68 mg/dL).   Medical History: Past Medical History:  Diagnosis Date  . Asthma   . CHF (congestive heart failure) (HCC)   . COPD (chronic obstructive pulmonary disease) (HCC)   . Diabetes mellitus without complication (HCC)   . Hypertension   . MI (myocardial infarction) (HCC)   . Sleep apnea     Assessment: 65 years of age female with significant cardiac history admitted on 03/28/20 with transient, exertional chest pain now s/p cardiac catheterization 03/30/20 found to have severe two-vessel CAD with unstable angina, moderate aortic stenosis, moderate MR, and LV EF of 45-50% with grade 2 diastolic function. CVTS has been consulted for consideration of surgical revascularization and or valvular surgery once Plavix has cleared (last dose 12/30 at 0800).  Pharmacy consulted to start  IV Heparin therapy.   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 Patient reports recent history of black stools requiring GI scans and findings of small bowel AVMs. She is not currently having any bleeding and knows to monitor closely. Discussed IV Heparin start, risk and benefits, and monitoring involved with patient.   Heparin level continues to be therapeutic at 0.35 on 1300 units/hr. Hgb is low but stable in 8's, PLTs WNL. No bleeding noted. Patient is scheduled for AVR on Monday morning.   Goal of Therapy:  Heparin level 0.3-0.7 units/ml Monitor platelets by anticoagulation protocol: Yes   Plan:  Continue heparin at 1300 units/hr Monitor daily Heparin level and CBC while on therapy.    Kinnie Feil, PharmD PGY1 Acute Care Pharmacy Resident Phone: [phone] 04/02/2020 7:50 AM  Please check [email or web address] for unit specific pharmacy phone numbers.

## 2020-04-02 NOTE — Anesthesia Preprocedure Evaluation (Addendum)
Anesthesia Evaluation  Patient identified by MRN, date of birth, ID band Patient awake    Reviewed: Allergy & Precautions, NPO status , Patient's Chart, lab work & pertinent test results  Airway Mallampati: III  TM Distance: >3 FB Neck ROM: Full    Dental  (+) Missing   Pulmonary asthma , sleep apnea , COPD,  COPD inhaler,    Pulmonary exam normal breath sounds clear to auscultation       Cardiovascular hypertension, Pt. on medications + angina + CAD, + Past MI and +CHF   Rhythm:Regular Rate:Normal + Systolic murmurs    Neuro/Psych negative neurological ROS  negative psych ROS   GI/Hepatic negative GI ROS, Neg liver ROS,   Endo/Other  diabetes, Insulin DependentMorbid obesity  Renal/GU negative Renal ROS     Musculoskeletal negative musculoskeletal ROS (+)   Abdominal (+) + obese,   Peds  Hematology  (+) anemia , HLD   Anesthesia Other Findings aortic valve stenosis coronary artery disease  Reproductive/Obstetrics                            Anesthesia Physical Anesthesia Plan  ASA: IV  Anesthesia Plan: General   Post-op Pain Management:    Induction: Intravenous  PONV Risk Score and Plan: 3 and Ondansetron, Dexamethasone, Midazolam and Treatment may vary due to age or medical condition  Airway Management Planned: Oral ETT  Additional Equipment: Arterial line, CVP, TEE and Ultrasound Guidance Line Placement  Intra-op Plan:   Post-operative Plan: Post-operative intubation/ventilation  Informed Consent: I have reviewed the patients History and Physical, chart, labs and discussed the procedure including the risks, benefits and alternatives for the proposed anesthesia with the patient or authorized representative who has indicated his/her understanding and acceptance.     Dental advisory given  Plan Discussed with: CRNA  Anesthesia Plan Comments:        Anesthesia  Quick Evaluation

## 2020-04-02 NOTE — Progress Notes (Addendum)
Patient denies chest pain or shortness of breath. Carotid duplex results noted-right ICA 40-59%. Will have further surveillance as outpatient.Anxiously awaiting surgery in am with Dr. Cliffton Asters.   No events Vitals:   04/02/20 0831 04/02/20 0837  BP: 104/60   Pulse: 68 85  Resp:  18  Temp:    SpO2:  97%   Alert NAD Sinus EWOB  65 yo female with 2V CAD, and moderate AS OR tomorrow for bioAVR/CABG2  Heidi Morse Heidi Morse

## 2020-04-02 NOTE — Progress Notes (Signed)
Pt refusing CPAP for tonight 

## 2020-04-02 NOTE — Progress Notes (Signed)
   Subjective: Patient reports feeling better this morning.  She reports she is much more anxious about her surgery yesterday.  She denies any chest pain overnight.  States she was always a difficult stick.  No new complaints.  Objective:  Vital signs in last 24 hours: Vitals:   04/01/20 1345 04/01/20 1530 04/02/20 0111 04/02/20 0500  BP: (!) 90/52 (!) 116/57 (!) 97/57   Pulse: 85 86 68   Resp: 18 18 18    Temp: 98.3 F (36.8 C) 98.6 F (37 C) 98.2 F (36.8 C)   TempSrc: Oral Oral Oral   SpO2: 99% 99% 94%   Weight:    102.4 kg  Height:       Physical Exam General: Patient is mobidly obese, no acute distress Respiratory: Lungs are CTA, bilaterally. No wheezes, rales, or rhonchi.  Cardiovascular: Rate is normal, rhythm is regular. 2/6 LUSB and 3/6 R upper chest holosystolic murmurs. No lower extremity edema. Neurological: Alert and oriented. Psych:  Mood normal, affect normal   Assessment/Plan:  Principal Problem:   Unstable angina (HCC) Active Problems:   Symptomatic anemia   Aortic stenosis  Heidi Morse is a 65 year old female with a past medical history of CHF, MI status post ostial LAD stent, hypertension, diabetes, COPD, asthma, sleep apnea and small bowel AVMs who presented with unstable angina.  Scheduled for CABG and TAVR for 04/03/2020.  Unstable angina Acute on chronic combined CHF Aortic stenosis, moderate Patient denies any more episodes of chest pain.  Plan is to proceed with CABG and TAVR on Monday.  She is still currently on heparin infusion. -Cardiology and CT surgery remain on board, appreciate their recommendations -Continue toprol xl, lasix, lipitor, Imdur and lisinopril -Continue strict ins and outs -Daily weights  Acute on chronic normocytic anemia secondary to slow GI bleed Chronic iron deficiency anemia Hemoglobin 8.6.  Status post 2 units PRBC.  No signs of active bleeding.  Continuing to monitor.  Globin goal greater than 8 due to active  ischemic heart disease.  Continuing heparin and aspirin unless signs and symptoms of active bleeding -Trend CBC -Status post 2 units PRBC -Continue iron supplementation  Hypertension Blood pressures have been on the softer side.  Continuing to hold amlodipine. -Continue Toprol, Imdur and lisinopril  Type 2 diabetes -Lantus 18 units nightly -SSI -CBG monitoring  OSA -Continue CPAP nightly  Prior to Admission Living Arrangement: Home Anticipated Discharge Location: Home Barriers to Discharge: Clinical improvement Dispo: Anticipated discharge pending clinical improvement  Monday, DO 04/02/2020, 6:35 AM Pager: [phone] After 5pm on weekdays and 1pm on weekends: On Call pager [phone]

## 2020-04-03 ENCOUNTER — Inpatient Hospital Stay (HOSPITAL_COMMUNITY): Payer: Medicaid Other | Admitting: Certified Registered"

## 2020-04-03 ENCOUNTER — Inpatient Hospital Stay (HOSPITAL_COMMUNITY): Payer: Medicaid Other

## 2020-04-03 ENCOUNTER — Encounter (HOSPITAL_COMMUNITY)
Admission: EM | Disposition: A | Discharge status: Home Health | Payer: Self-pay | Source: Home / Self Care | Attending: Thoracic Surgery (Cardiothoracic Vascular Surgery)

## 2020-04-03 DIAGNOSIS — I2510 Atherosclerotic heart disease of native coronary artery without angina pectoris: Secondary | ICD-10-CM | POA: Diagnosis present

## 2020-04-03 DIAGNOSIS — Z953 Presence of xenogenic heart valve: Secondary | ICD-10-CM

## 2020-04-03 DIAGNOSIS — I25110 Atherosclerotic heart disease of native coronary artery with unstable angina pectoris: Secondary | ICD-10-CM

## 2020-04-03 DIAGNOSIS — T82855A Stenosis of coronary artery stent, initial encounter: Secondary | ICD-10-CM | POA: Diagnosis not present

## 2020-04-03 DIAGNOSIS — I350 Nonrheumatic aortic (valve) stenosis: Secondary | ICD-10-CM

## 2020-04-03 DIAGNOSIS — Z20822 Contact with and (suspected) exposure to covid-19: Secondary | ICD-10-CM | POA: Diagnosis not present

## 2020-04-03 DIAGNOSIS — I5043 Acute on chronic combined systolic (congestive) and diastolic (congestive) heart failure: Secondary | ICD-10-CM | POA: Diagnosis not present

## 2020-04-03 DIAGNOSIS — Z951 Presence of aortocoronary bypass graft: Secondary | ICD-10-CM

## 2020-04-03 HISTORY — PX: AORTIC ROOT ENLARGEMENT: SHX6346

## 2020-04-03 HISTORY — PX: ENDOVEIN HARVEST OF GREATER SAPHENOUS VEIN: SHX5059

## 2020-04-03 HISTORY — PX: TEE WITHOUT CARDIOVERSION: SHX5443

## 2020-04-03 HISTORY — PX: AORTIC VALVE REPLACEMENT: SHX41

## 2020-04-03 LAB — POCT I-STAT 7, (LYTES, BLD GAS, ICA,H+H)
Acid-Base Excess: 3 mmol/L — ABNORMAL HIGH (ref 0.0–2.0)
Acid-Base Excess: 2 mmol/L (ref 0.0–2.0)
Acid-Base Excess: 3 mmol/L — ABNORMAL HIGH (ref 0.0–2.0)
Acid-base deficit: 1 mmol/L (ref 0.0–2.0)
Acid-base deficit: 1 mmol/L (ref 0.0–2.0)
Acid-base deficit: 2 mmol/L (ref 0.0–2.0)
Acid-base deficit: 5 mmol/L — ABNORMAL HIGH (ref 0.0–2.0)
Bicarbonate: 24.4 mmol/L (ref 20.0–28.0)
Bicarbonate: 28.2 mmol/L — ABNORMAL HIGH (ref 20.0–28.0)
Bicarbonate: 26.4 mmol/L (ref 20.0–28.0)
Bicarbonate: 27.9 mmol/L (ref 20.0–28.0)
Bicarbonate: 24.3 mmol/L (ref 20.0–28.0)
Bicarbonate: 22.6 mmol/L (ref 20.0–28.0)
Bicarbonate: 20 mmol/L (ref 20.0–28.0)
Calcium, Ion: 1.21 mmol/L (ref 1.15–1.40)
Calcium, Ion: 1.03 mmol/L — ABNORMAL LOW (ref 1.15–1.40)
Calcium, Ion: 1.03 mmol/L — ABNORMAL LOW (ref 1.15–1.40)
Calcium, Ion: 1 mmol/L — ABNORMAL LOW (ref 1.15–1.40)
Calcium, Ion: 1.26 mmol/L (ref 1.15–1.40)
Calcium, Ion: 1.06 mmol/L — ABNORMAL LOW (ref 1.15–1.40)
Calcium, Ion: 1.09 mmol/L — ABNORMAL LOW (ref 1.15–1.40)
HCT: 25 % — ABNORMAL LOW (ref 36.0–46.0)
HCT: 21 % — ABNORMAL LOW (ref 36.0–46.0)
HCT: 21 % — ABNORMAL LOW (ref 36.0–46.0)
HCT: 20 % — ABNORMAL LOW (ref 36.0–46.0)
HCT: 24 % — ABNORMAL LOW (ref 36.0–46.0)
HCT: 24 % — ABNORMAL LOW (ref 36.0–46.0)
HCT: 16 % — ABNORMAL LOW (ref 36.0–46.0)
Hemoglobin: 8.5 g/dL — ABNORMAL LOW (ref 12.0–15.0)
Hemoglobin: 7.1 g/dL — ABNORMAL LOW (ref 12.0–15.0)
Hemoglobin: 7.1 g/dL — ABNORMAL LOW (ref 12.0–15.0)
Hemoglobin: 6.8 g/dL — CL (ref 12.0–15.0)
Hemoglobin: 8.2 g/dL — ABNORMAL LOW (ref 12.0–15.0)
Hemoglobin: 8.2 g/dL — ABNORMAL LOW (ref 12.0–15.0)
Hemoglobin: 5.4 g/dL — CL (ref 12.0–15.0)
O2 Saturation: 100 %
O2 Saturation: 100 %
O2 Saturation: 100 %
O2 Saturation: 100 %
O2 Saturation: 96 %
O2 Saturation: 99 %
O2 Saturation: 98 %
Patient temperature: 36.3
Patient temperature: 36.8
Potassium: 3.6 mmol/L (ref 3.5–5.1)
Potassium: 3.9 mmol/L (ref 3.5–5.1)
Potassium: 4.4 mmol/L (ref 3.5–5.1)
Potassium: 4.2 mmol/L (ref 3.5–5.1)
Potassium: 4.4 mmol/L (ref 3.5–5.1)
Potassium: 4 mmol/L (ref 3.5–5.1)
Potassium: 3.6 mmol/L (ref 3.5–5.1)
Sodium: 142 mmol/L (ref 135–145)
Sodium: 142 mmol/L (ref 135–145)
Sodium: 140 mmol/L (ref 135–145)
Sodium: 141 mmol/L (ref 135–145)
Sodium: 142 mmol/L (ref 135–145)
Sodium: 145 mmol/L (ref 135–145)
Sodium: 143 mmol/L (ref 135–145)
TCO2: 26 mmol/L (ref 22–32)
TCO2: 30 mmol/L (ref 22–32)
TCO2: 28 mmol/L (ref 22–32)
TCO2: 29 mmol/L (ref 22–32)
TCO2: 26 mmol/L (ref 22–32)
TCO2: 24 mmol/L (ref 22–32)
TCO2: 21 mmol/L — ABNORMAL LOW (ref 22–32)
pCO2 arterial: 41.1 mmHg (ref 32.0–48.0)
pCO2 arterial: 45.6 mmHg (ref 32.0–48.0)
pCO2 arterial: 39.5 mmHg (ref 32.0–48.0)
pCO2 arterial: 41.8 mmHg (ref 32.0–48.0)
pCO2 arterial: 41.1 mmHg (ref 32.0–48.0)
pCO2 arterial: 33.1 mmHg (ref 32.0–48.0)
pCO2 arterial: 33.2 mmHg (ref 32.0–48.0)
pH, Arterial: 7.381 (ref 7.350–7.450)
pH, Arterial: 7.4 (ref 7.350–7.450)
pH, Arterial: 7.433 (ref 7.350–7.450)
pH, Arterial: 7.431 (ref 7.350–7.450)
pH, Arterial: 7.38 (ref 7.350–7.450)
pH, Arterial: 7.438 (ref 7.350–7.450)
pH, Arterial: 7.388 (ref 7.350–7.450)
pO2, Arterial: 290 mmHg — ABNORMAL HIGH (ref 83.0–108.0)
pO2, Arterial: 369 mmHg — ABNORMAL HIGH (ref 83.0–108.0)
pO2, Arterial: 404 mmHg — ABNORMAL HIGH (ref 83.0–108.0)
pO2, Arterial: 398 mmHg — ABNORMAL HIGH (ref 83.0–108.0)
pO2, Arterial: 81 mmHg — ABNORMAL LOW (ref 83.0–108.0)
pO2, Arterial: 107 mmHg (ref 83.0–108.0)
pO2, Arterial: 112 mmHg — ABNORMAL HIGH (ref 83.0–108.0)

## 2020-04-03 LAB — FIBRINOGEN
Fibrinogen: 258 mg/dL (ref 210–475)
Fibrinogen: 247 mg/dL (ref 210–475)

## 2020-04-03 LAB — POCT I-STAT, CHEM 8
BUN: 12 mg/dL (ref 8–23)
BUN: 12 mg/dL (ref 8–23)
BUN: 11 mg/dL (ref 8–23)
BUN: 11 mg/dL (ref 8–23)
BUN: 12 mg/dL (ref 8–23)
BUN: 11 mg/dL (ref 8–23)
Calcium, Ion: 1.21 mmol/L (ref 1.15–1.40)
Calcium, Ion: 1.21 mmol/L (ref 1.15–1.40)
Calcium, Ion: 1.04 mmol/L — ABNORMAL LOW (ref 1.15–1.40)
Calcium, Ion: 1.02 mmol/L — ABNORMAL LOW (ref 1.15–1.40)
Calcium, Ion: 0.96 mmol/L — ABNORMAL LOW (ref 1.15–1.40)
Calcium, Ion: 1.26 mmol/L (ref 1.15–1.40)
Chloride: 107 mmol/L (ref 98–111)
Chloride: 107 mmol/L (ref 98–111)
Chloride: 104 mmol/L (ref 98–111)
Chloride: 102 mmol/L (ref 98–111)
Chloride: 103 mmol/L (ref 98–111)
Chloride: 107 mmol/L (ref 98–111)
Creatinine, Ser: 0.5 mg/dL (ref 0.44–1.00)
Creatinine, Ser: 0.5 mg/dL (ref 0.44–1.00)
Creatinine, Ser: 0.4 mg/dL — ABNORMAL LOW (ref 0.44–1.00)
Creatinine, Ser: 0.4 mg/dL — ABNORMAL LOW (ref 0.44–1.00)
Creatinine, Ser: 0.4 mg/dL — ABNORMAL LOW (ref 0.44–1.00)
Creatinine, Ser: 0.5 mg/dL (ref 0.44–1.00)
Glucose, Bld: 112 mg/dL — ABNORMAL HIGH (ref 70–99)
Glucose, Bld: 108 mg/dL — ABNORMAL HIGH (ref 70–99)
Glucose, Bld: 128 mg/dL — ABNORMAL HIGH (ref 70–99)
Glucose, Bld: 133 mg/dL — ABNORMAL HIGH (ref 70–99)
Glucose, Bld: 160 mg/dL — ABNORMAL HIGH (ref 70–99)
Glucose, Bld: 139 mg/dL — ABNORMAL HIGH (ref 70–99)
HCT: 27 % — ABNORMAL LOW (ref 36.0–46.0)
HCT: 26 % — ABNORMAL LOW (ref 36.0–46.0)
HCT: 22 % — ABNORMAL LOW (ref 36.0–46.0)
HCT: 20 % — ABNORMAL LOW (ref 36.0–46.0)
HCT: 26 % — ABNORMAL LOW (ref 36.0–46.0)
HCT: 27 % — ABNORMAL LOW (ref 36.0–46.0)
Hemoglobin: 9.2 g/dL — ABNORMAL LOW (ref 12.0–15.0)
Hemoglobin: 8.8 g/dL — ABNORMAL LOW (ref 12.0–15.0)
Hemoglobin: 7.5 g/dL — ABNORMAL LOW (ref 12.0–15.0)
Hemoglobin: 6.8 g/dL — CL (ref 12.0–15.0)
Hemoglobin: 8.8 g/dL — ABNORMAL LOW (ref 12.0–15.0)
Hemoglobin: 9.2 g/dL — ABNORMAL LOW (ref 12.0–15.0)
Potassium: 3.6 mmol/L (ref 3.5–5.1)
Potassium: 3.7 mmol/L (ref 3.5–5.1)
Potassium: 4.7 mmol/L (ref 3.5–5.1)
Potassium: 4.1 mmol/L (ref 3.5–5.1)
Potassium: 4.5 mmol/L (ref 3.5–5.1)
Potassium: 4.3 mmol/L (ref 3.5–5.1)
Sodium: 143 mmol/L (ref 135–145)
Sodium: 141 mmol/L (ref 135–145)
Sodium: 139 mmol/L (ref 135–145)
Sodium: 139 mmol/L (ref 135–145)
Sodium: 140 mmol/L (ref 135–145)
Sodium: 142 mmol/L (ref 135–145)
TCO2: 23 mmol/L (ref 22–32)
TCO2: 23 mmol/L (ref 22–32)
TCO2: 27 mmol/L (ref 22–32)
TCO2: 26 mmol/L (ref 22–32)
TCO2: 25 mmol/L (ref 22–32)
TCO2: 23 mmol/L (ref 22–32)

## 2020-04-03 LAB — BASIC METABOLIC PANEL
Anion gap: 12 (ref 5–15)
Anion gap: 9 (ref 5–15)
BUN: 13 mg/dL (ref 8–23)
BUN: 13 mg/dL (ref 8–23)
CO2: 23 mmol/L (ref 22–32)
CO2: 20 mmol/L — ABNORMAL LOW (ref 22–32)
Calcium: 8.9 mg/dL (ref 8.9–10.3)
Calcium: 7.3 mg/dL — ABNORMAL LOW (ref 8.9–10.3)
Chloride: 106 mmol/L (ref 98–111)
Chloride: 111 mmol/L (ref 98–111)
Creatinine, Ser: 0.74 mg/dL (ref 0.44–1.00)
Creatinine, Ser: 0.7 mg/dL (ref 0.44–1.00)
GFR, Estimated: >60 mL/min (ref >60)
GFR, Estimated: >60 mL/min (ref >60)
Glucose, Bld: 135 mg/dL — ABNORMAL HIGH (ref 70–99)
Glucose, Bld: 173 mg/dL — ABNORMAL HIGH (ref 70–99)
Potassium: 3.6 mmol/L (ref 3.5–5.1)
Potassium: 4 mmol/L (ref 3.5–5.1)
Sodium: 141 mmol/L (ref 135–145)
Sodium: 140 mmol/L (ref 135–145)

## 2020-04-03 LAB — BLOOD GAS, ARTERIAL
Acid-base deficit: 0.7 mmol/L (ref 0.0–2.0)
Bicarbonate: 23.1 mmol/L (ref 20.0–28.0)
Drawn by: 33100
FIO2: 21
O2 Saturation: 97.1 %
Patient temperature: 37
pCO2 arterial: 35.5 mmHg (ref 32.0–48.0)
pH, Arterial: 7.428 (ref 7.350–7.450)
pO2, Arterial: 87.1 mmHg (ref 83.0–108.0)

## 2020-04-03 LAB — CBC
HCT: 31.1 % — ABNORMAL LOW (ref 36.0–46.0)
HCT: 25.1 % — ABNORMAL LOW (ref 36.0–46.0)
HCT: 25.2 % — ABNORMAL LOW (ref 36.0–46.0)
HCT: 19.2 % — ABNORMAL LOW (ref 36.0–46.0)
Hemoglobin: 9.6 g/dL — ABNORMAL LOW (ref 12.0–15.0)
Hemoglobin: 8 g/dL — ABNORMAL LOW (ref 12.0–15.0)
Hemoglobin: 7.9 g/dL — ABNORMAL LOW (ref 12.0–15.0)
Hemoglobin: 6.4 g/dL — CL (ref 12.0–15.0)
MCH: 26.8 pg (ref 26.0–34.0)
MCH: 27.7 pg (ref 26.0–34.0)
MCH: 27.3 pg (ref 26.0–34.0)
MCH: 28.7 pg (ref 26.0–34.0)
MCHC: 30.9 g/dL (ref 30.0–36.0)
MCHC: 31.9 g/dL (ref 30.0–36.0)
MCHC: 31.3 g/dL (ref 30.0–36.0)
MCHC: 33.3 g/dL (ref 30.0–36.0)
MCV: 86.9 fL (ref 80.0–100.0)
MCV: 86.9 fL (ref 80.0–100.0)
MCV: 87.2 fL (ref 80.0–100.0)
MCV: 86.1 fL (ref 80.0–100.0)
Platelets: 250 10*3/uL (ref 150–400)
Platelets: 151 10*3/uL (ref 150–400)
Platelets: 153 10*3/uL (ref 150–400)
Platelets: 148 10*3/uL — ABNORMAL LOW (ref 150–400)
RBC: 3.58 MIL/uL — ABNORMAL LOW (ref 3.87–5.11)
RBC: 2.89 MIL/uL — ABNORMAL LOW (ref 3.87–5.11)
RBC: 2.89 MIL/uL — ABNORMAL LOW (ref 3.87–5.11)
RBC: 2.23 MIL/uL — ABNORMAL LOW (ref 3.87–5.11)
RDW: 19.7 % — ABNORMAL HIGH (ref 11.5–15.5)
RDW: 17.6 % — ABNORMAL HIGH (ref 11.5–15.5)
RDW: 17.6 % — ABNORMAL HIGH (ref 11.5–15.5)
RDW: 17.9 % — ABNORMAL HIGH (ref 11.5–15.5)
WBC: 11 10*3/uL — ABNORMAL HIGH (ref 4.0–10.5)
WBC: 20 10*3/uL — ABNORMAL HIGH (ref 4.0–10.5)
WBC: 18.8 10*3/uL — ABNORMAL HIGH (ref 4.0–10.5)
WBC: 15.7 10*3/uL — ABNORMAL HIGH (ref 4.0–10.5)
nRBC: 0.2 % (ref 0.0–0.2)
nRBC: 0.3 % — ABNORMAL HIGH (ref 0.0–0.2)
nRBC: 0.3 % — ABNORMAL HIGH (ref 0.0–0.2)
nRBC: 0.6 % — ABNORMAL HIGH (ref 0.0–0.2)

## 2020-04-03 LAB — GLUCOSE, CAPILLARY
Glucose-Capillary: 123 mg/dL — ABNORMAL HIGH (ref 70–99)
Glucose-Capillary: 126 mg/dL — ABNORMAL HIGH (ref 70–99)
Glucose-Capillary: 132 mg/dL — ABNORMAL HIGH (ref 70–99)
Glucose-Capillary: 132 mg/dL — ABNORMAL HIGH (ref 70–99)
Glucose-Capillary: 137 mg/dL — ABNORMAL HIGH (ref 70–99)
Glucose-Capillary: 146 mg/dL — ABNORMAL HIGH (ref 70–99)
Glucose-Capillary: 147 mg/dL — ABNORMAL HIGH (ref 70–99)
Glucose-Capillary: 161 mg/dL — ABNORMAL HIGH (ref 70–99)
Glucose-Capillary: 171 mg/dL — ABNORMAL HIGH (ref 70–99)

## 2020-04-03 LAB — PROTIME-INR
INR: 1.7 — ABNORMAL HIGH (ref 0.8–1.2)
INR: 1.7 — ABNORMAL HIGH (ref 0.8–1.2)
Prothrombin Time: 19.2 seconds — ABNORMAL HIGH (ref 11.4–15.2)
Prothrombin Time: 19.1 seconds — ABNORMAL HIGH (ref 11.4–15.2)

## 2020-04-03 LAB — TYPE AND SCREEN
ABO/RH(D): A POS
Antibody Screen: NEGATIVE
Unit division: 0
Unit division: 0
Unit division: 0

## 2020-04-03 LAB — POCT I-STAT EG7
Acid-Base Excess: 0 mmol/L (ref 0.0–2.0)
Bicarbonate: 26 mmol/L (ref 20.0–28.0)
Calcium, Ion: 1.05 mmol/L — ABNORMAL LOW (ref 1.15–1.40)
HCT: 26 % — ABNORMAL LOW (ref 36.0–46.0)
Hemoglobin: 8.8 g/dL — ABNORMAL LOW (ref 12.0–15.0)
O2 Saturation: 76 %
Potassium: 3.8 mmol/L (ref 3.5–5.1)
Sodium: 143 mmol/L (ref 135–145)
TCO2: 27 mmol/L (ref 22–32)
pCO2, Ven: 46.3 mmHg (ref 44.0–60.0)
pH, Ven: 7.357 (ref 7.250–7.430)
pO2, Ven: 43 mmHg (ref 32.0–45.0)

## 2020-04-03 LAB — BPAM RBC
Blood Product Expiration Date: 202201202359
Blood Product Expiration Date: 202201252359
Blood Product Expiration Date: 202201252359
ISSUE DATE / TIME: 202201011327
Unit Type and Rh: 6200
Unit Type and Rh: 6200
Unit Type and Rh: 6200

## 2020-04-03 LAB — APTT
aPTT: 32 seconds (ref 24–36)
aPTT: 36 seconds (ref 24–36)

## 2020-04-03 LAB — PREPARE RBC (CROSSMATCH)

## 2020-04-03 LAB — PLATELET COUNT: Platelets: 104 10*3/uL — ABNORMAL LOW (ref 150–400)

## 2020-04-03 LAB — HEMOGLOBIN AND HEMATOCRIT, BLOOD
HCT: 21 % — ABNORMAL LOW (ref 36.0–46.0)
Hemoglobin: 6.5 g/dL — CL (ref 12.0–15.0)

## 2020-04-03 LAB — SURGICAL PCR SCREEN
MRSA, PCR: NEGATIVE
Staphylococcus aureus: NEGATIVE

## 2020-04-03 LAB — ECHO INTRAOPERATIVE TEE
Height: 60 in
Weight: 3574.4 oz

## 2020-04-03 LAB — HEPARIN LEVEL (UNFRACTIONATED): Heparin Unfractionated: 0.35 IU/mL (ref 0.30–0.70)

## 2020-04-03 LAB — MAGNESIUM: Magnesium: 3.1 mg/dL — ABNORMAL HIGH (ref 1.7–2.4)

## 2020-04-03 SURGERY — REPLACEMENT, AORTIC VALVE, OPEN
Anesthesia: General | Site: Chest | Laterality: Right

## 2020-04-03 MED ORDER — MORPHINE SULFATE (PF) 2 MG/ML IV SOLN
1.0000 mg | INTRAVENOUS | Status: DC | PRN
  Administered 2020-04-03 – 2020-04-04 (×2): 2 mg via INTRAVENOUS
  Filled 2020-04-03 (×2): qty 1

## 2020-04-03 MED ORDER — MIDAZOLAM HCL 2 MG/2ML IJ SOLN: 2.0000 mg | INTRAMUSCULAR | Status: DC | PRN

## 2020-04-03 MED ORDER — FAMOTIDINE IN NACL 20-0.9 MG/50ML-% IV SOLN
20.0000 mg | Freq: Two times a day (BID) | INTRAVENOUS | Status: AC
  Administered 2020-04-03 – 2020-04-04 (×2): 20 mg via INTRAVENOUS
  Filled 2020-04-03 (×2): qty 50

## 2020-04-03 MED ORDER — ONDANSETRON HCL 4 MG/2ML IJ SOLN
4.0000 mg | Freq: Four times a day (QID) | INTRAMUSCULAR | Status: DC | PRN
  Administered 2020-04-04: 4 mg via INTRAVENOUS
  Filled 2020-04-03: qty 2

## 2020-04-03 MED ORDER — SODIUM CHLORIDE 0.45 % IV SOLN: INTRAVENOUS | Status: DC | PRN

## 2020-04-03 MED ORDER — ASPIRIN EC 325 MG PO TBEC
325.0000 mg | DELAYED_RELEASE_TABLET | Freq: Every day | ORAL | Status: DC
  Administered 2020-04-04 – 2020-04-05 (×2): 325 mg via ORAL
  Filled 2020-04-03 (×2): qty 1

## 2020-04-03 MED ORDER — SODIUM CHLORIDE (PF) 0.9 % IJ SOLN
OROMUCOSAL | Status: DC | PRN
  Administered 2020-04-03 (×2): 4 mL via TOPICAL

## 2020-04-03 MED ORDER — SODIUM CHLORIDE 0.9% IV SOLUTION: Freq: Once | INTRAVENOUS | Status: DC

## 2020-04-03 MED ORDER — LACTATED RINGERS IV SOLN: INTRAVENOUS | Status: DC | PRN

## 2020-04-03 MED ORDER — FENTANYL CITRATE (PF) 250 MCG/5ML IJ SOLN
INTRAMUSCULAR | Status: AC
  Filled 2020-04-03: qty 5

## 2020-04-03 MED ORDER — HEPARIN SODIUM (PORCINE) 1000 UNIT/ML IJ SOLN
INTRAMUSCULAR | Status: DC | PRN
  Administered 2020-04-03: 5000 [IU] via INTRAVENOUS
  Administered 2020-04-03: 36000 [IU] via INTRAVENOUS

## 2020-04-03 MED ORDER — 0.9 % SODIUM CHLORIDE (POUR BTL) OPTIME
TOPICAL | Status: DC | PRN
  Administered 2020-04-03: 5000 mL

## 2020-04-03 MED ORDER — SODIUM CHLORIDE 0.9% FLUSH: 10.0000 mL | INTRAVENOUS | Status: DC | PRN

## 2020-04-03 MED ORDER — ORAL CARE MOUTH RINSE
15.0000 mL | OROMUCOSAL | Status: DC
  Administered 2020-04-03 – 2020-04-04 (×6): 15 mL via OROMUCOSAL

## 2020-04-03 MED ORDER — INSULIN REGULAR(HUMAN) IN NACL 100-0.9 UT/100ML-% IV SOLN
INTRAVENOUS | Status: DC
  Filled 2020-04-03: qty 100

## 2020-04-03 MED ORDER — SODIUM CHLORIDE 0.9 % IV SOLN
1.5000 g | Freq: Two times a day (BID) | INTRAVENOUS | Status: AC
  Administered 2020-04-03 – 2020-04-05 (×4): 1.5 g via INTRAVENOUS
  Filled 2020-04-03 (×2): qty 1.5

## 2020-04-03 MED ORDER — SODIUM CHLORIDE 0.9% FLUSH
3.0000 mL | Freq: Two times a day (BID) | INTRAVENOUS | Status: DC
  Administered 2020-04-04 – 2020-04-05 (×2): 3 mL via INTRAVENOUS

## 2020-04-03 MED ORDER — NOREPINEPHRINE 4 MG/250ML-% IV SOLN
0.0000 ug/min | INTRAVENOUS | Status: DC
  Administered 2020-04-03: 12 ug/min via INTRAVENOUS
  Administered 2020-04-04: 6 ug/min via INTRAVENOUS
  Filled 2020-04-03 (×2): qty 250

## 2020-04-03 MED ORDER — ASPIRIN 81 MG PO CHEW: 324.0000 mg | CHEWABLE_TABLET | Freq: Every day | ORAL | Status: DC

## 2020-04-03 MED ORDER — POTASSIUM CHLORIDE 10 MEQ/50ML IV SOLN: 10.0000 meq | INTRAVENOUS | Status: AC

## 2020-04-03 MED ORDER — PROPOFOL 10 MG/ML IV BOLUS
INTRAVENOUS | Status: DC | PRN
  Administered 2020-04-03: 120 mg via INTRAVENOUS
  Administered 2020-04-03: 40 mg via INTRAVENOUS

## 2020-04-03 MED ORDER — PHENYLEPHRINE 40 MCG/ML (10ML) SYRINGE FOR IV PUSH (FOR BLOOD PRESSURE SUPPORT)
PREFILLED_SYRINGE | INTRAVENOUS | Status: DC | PRN
  Administered 2020-04-03: 80 ug via INTRAVENOUS

## 2020-04-03 MED ORDER — PHENYLEPHRINE 40 MCG/ML (10ML) SYRINGE FOR IV PUSH (FOR BLOOD PRESSURE SUPPORT)
PREFILLED_SYRINGE | INTRAVENOUS | Status: AC
  Filled 2020-04-03: qty 10

## 2020-04-03 MED ORDER — HEPARIN SODIUM (PORCINE) 1000 UNIT/ML IJ SOLN
INTRAMUSCULAR | Status: AC
  Filled 2020-04-03: qty 1

## 2020-04-03 MED ORDER — CHLORHEXIDINE GLUCONATE 0.12 % MT SOLN
15.0000 mL | OROMUCOSAL | Status: AC
  Administered 2020-04-03: 15 mL via OROMUCOSAL

## 2020-04-03 MED ORDER — FENTANYL CITRATE (PF) 250 MCG/5ML IJ SOLN
INTRAMUSCULAR | Status: DC | PRN
  Administered 2020-04-03: 100 ug via INTRAVENOUS
  Administered 2020-04-03: 50 ug via INTRAVENOUS
  Administered 2020-04-03 (×9): 100 ug via INTRAVENOUS

## 2020-04-03 MED ORDER — LACTATED RINGERS IV SOLN: INTRAVENOUS | Status: DC

## 2020-04-03 MED ORDER — LIDOCAINE 2% (20 MG/ML) 5 ML SYRINGE
INTRAMUSCULAR | Status: AC
  Filled 2020-04-03: qty 5

## 2020-04-03 MED ORDER — PROTAMINE SULFATE 10 MG/ML IV SOLN
INTRAVENOUS | Status: AC
  Filled 2020-04-03: qty 25

## 2020-04-03 MED ORDER — MIDAZOLAM HCL 2 MG/2ML IJ SOLN
INTRAMUSCULAR | Status: AC
  Filled 2020-04-03: qty 2

## 2020-04-03 MED ORDER — PROTAMINE SULFATE 10 MG/ML IV SOLN
INTRAVENOUS | Status: DC | PRN
  Administered 2020-04-03: 340 mg via INTRAVENOUS
  Administered 2020-04-03: 20 mg via INTRAVENOUS

## 2020-04-03 MED ORDER — LACTATED RINGERS IV SOLN
500.0000 mL | Freq: Once | INTRAVENOUS | Status: AC | PRN
  Administered 2020-04-03: 500 mL via INTRAVENOUS

## 2020-04-03 MED ORDER — METOPROLOL TARTRATE 5 MG/5ML IV SOLN: 2.5000 mg | INTRAVENOUS | Status: DC | PRN

## 2020-04-03 MED ORDER — GABAPENTIN 300 MG PO CAPS
300.0000 mg | ORAL_CAPSULE | Freq: Two times a day (BID) | ORAL | Status: DC
  Administered 2020-04-05 – 2020-04-11 (×13): 300 mg via ORAL
  Filled 2020-04-03 (×13): qty 1

## 2020-04-03 MED ORDER — ALBUMIN HUMAN 5 % IV SOLN
250.0000 mL | INTRAVENOUS | Status: AC | PRN
  Administered 2020-04-03 (×4): 12.5 g via INTRAVENOUS
  Filled 2020-04-03 (×2): qty 250

## 2020-04-03 MED ORDER — ROCURONIUM BROMIDE 10 MG/ML (PF) SYRINGE
PREFILLED_SYRINGE | INTRAVENOUS | Status: AC
  Filled 2020-04-03: qty 10

## 2020-04-03 MED ORDER — ALBUMIN HUMAN 5 % IV SOLN: 250.0000 mL | INTRAVENOUS | Status: AC | PRN

## 2020-04-03 MED ORDER — DEXMEDETOMIDINE HCL IN NACL 400 MCG/100ML IV SOLN
0.0000 ug/kg/h | INTRAVENOUS | Status: DC
  Administered 2020-04-04: 0.5 ug/kg/h via INTRAVENOUS
  Filled 2020-04-03: qty 100

## 2020-04-03 MED ORDER — METOPROLOL TARTRATE 12.5 MG HALF TABLET
12.5000 mg | ORAL_TABLET | Freq: Two times a day (BID) | ORAL | Status: DC
  Administered 2020-04-04: 12.5 mg via ORAL
  Filled 2020-04-03 (×2): qty 1

## 2020-04-03 MED ORDER — ACETAMINOPHEN 160 MG/5ML PO SOLN
1000.0000 mg | Freq: Four times a day (QID) | ORAL | Status: DC
  Administered 2020-04-03 – 2020-04-04 (×2): 1000 mg
  Filled 2020-04-03: qty 40.6

## 2020-04-03 MED ORDER — PANTOPRAZOLE SODIUM 40 MG PO TBEC
40.0000 mg | DELAYED_RELEASE_TABLET | Freq: Every day | ORAL | Status: DC
  Administered 2020-04-05: 40 mg via ORAL
  Filled 2020-04-03: qty 1

## 2020-04-03 MED ORDER — MIDAZOLAM HCL (PF) 10 MG/2ML IJ SOLN
INTRAMUSCULAR | Status: AC
  Filled 2020-04-03: qty 2

## 2020-04-03 MED ORDER — ACETAMINOPHEN 650 MG RE SUPP
650.0000 mg | Freq: Once | RECTAL | Status: AC
  Administered 2020-04-03: 650 mg via RECTAL

## 2020-04-03 MED ORDER — PHENYLEPHRINE HCL-NACL 20-0.9 MG/250ML-% IV SOLN: 0.0000 ug/min | INTRAVENOUS | Status: DC

## 2020-04-03 MED ORDER — PROPOFOL 10 MG/ML IV BOLUS
INTRAVENOUS | Status: AC
  Filled 2020-04-03: qty 20

## 2020-04-03 MED ORDER — STERILE WATER FOR IRRIGATION IR SOLN
Status: DC | PRN
  Administered 2020-04-03: 2000 mL

## 2020-04-03 MED ORDER — CHLORHEXIDINE GLUCONATE CLOTH 2 % EX PADS
6.0000 | MEDICATED_PAD | Freq: Every day | CUTANEOUS | Status: DC
  Administered 2020-04-03 – 2020-04-04 (×2): 6 via TOPICAL

## 2020-04-03 MED ORDER — MIDAZOLAM HCL 5 MG/5ML IJ SOLN
INTRAMUSCULAR | Status: DC | PRN
  Administered 2020-04-03 (×2): 2 mg via INTRAVENOUS
  Administered 2020-04-03: 1 mg via INTRAVENOUS
  Administered 2020-04-03 (×2): 2 mg via INTRAVENOUS
  Administered 2020-04-03: 1 mg via INTRAVENOUS
  Administered 2020-04-03: 2 mg via INTRAVENOUS

## 2020-04-03 MED ORDER — FENTANYL CITRATE (PF) 250 MCG/5ML IJ SOLN
INTRAMUSCULAR | Status: AC
  Filled 2020-04-03: qty 20

## 2020-04-03 MED ORDER — SODIUM CHLORIDE 0.9% IV SOLUTION: Freq: Once | INTRAVENOUS | Status: AC

## 2020-04-03 MED ORDER — MAGNESIUM SULFATE 4 GM/100ML IV SOLN
4.0000 g | Freq: Once | INTRAVENOUS | Status: AC
  Administered 2020-04-03: 4 g via INTRAVENOUS
  Filled 2020-04-03: qty 100

## 2020-04-03 MED ORDER — NICARDIPINE HCL IN NACL 20-0.86 MG/200ML-% IV SOLN
5.0000 mg/h | INTRAVENOUS | Status: DC
  Filled 2020-04-03: qty 200

## 2020-04-03 MED ORDER — BISACODYL 5 MG PO TBEC
10.0000 mg | DELAYED_RELEASE_TABLET | Freq: Every day | ORAL | Status: DC
  Administered 2020-04-04 – 2020-04-05 (×2): 10 mg via ORAL
  Filled 2020-04-03 (×2): qty 2

## 2020-04-03 MED ORDER — PLASMA-LYTE 148 IV SOLN
INTRAVENOUS | Status: DC | PRN
  Administered 2020-04-03: 500 mL via INTRAVASCULAR

## 2020-04-03 MED ORDER — BISACODYL 10 MG RE SUPP: 10.0000 mg | Freq: Every day | RECTAL | Status: DC

## 2020-04-03 MED ORDER — ACETAMINOPHEN 500 MG PO TABS
1000.0000 mg | ORAL_TABLET | Freq: Four times a day (QID) | ORAL | Status: DC
  Administered 2020-04-04 – 2020-04-05 (×5): 1000 mg via ORAL
  Filled 2020-04-03 (×5): qty 2

## 2020-04-03 MED ORDER — HEMOSTATIC AGENTS (NO CHARGE) OPTIME
TOPICAL | Status: DC | PRN
Start: 2020-04-03 — End: 2020-04-03
  Administered 2020-04-03: 1 via TOPICAL

## 2020-04-03 MED ORDER — SODIUM CHLORIDE 0.9% FLUSH: 3.0000 mL | INTRAVENOUS | Status: DC | PRN

## 2020-04-03 MED ORDER — SODIUM CHLORIDE (PF) 0.9 % IJ SOLN
INTRAMUSCULAR | Status: AC
  Filled 2020-04-03: qty 10

## 2020-04-03 MED ORDER — SODIUM CHLORIDE 0.9% FLUSH
10.0000 mL | Freq: Two times a day (BID) | INTRAVENOUS | Status: DC
  Administered 2020-04-04 – 2020-04-05 (×4): 10 mL

## 2020-04-03 MED ORDER — ACETAMINOPHEN 160 MG/5ML PO SOLN: 650.0000 mg | Freq: Once | ORAL | Status: AC

## 2020-04-03 MED ORDER — OXYCODONE HCL 5 MG PO TABS
5.0000 mg | ORAL_TABLET | ORAL | Status: DC | PRN
Start: 2020-04-03 — End: 2020-04-05
  Administered 2020-04-04: 5 mg via ORAL
  Filled 2020-04-03: qty 1

## 2020-04-03 MED ORDER — VANCOMYCIN HCL IN DEXTROSE 1-5 GM/200ML-% IV SOLN
1000.0000 mg | Freq: Once | INTRAVENOUS | Status: AC
  Administered 2020-04-03: 1000 mg via INTRAVENOUS
  Filled 2020-04-03: qty 200

## 2020-04-03 MED ORDER — TRAMADOL HCL 50 MG PO TABS
50.0000 mg | ORAL_TABLET | ORAL | Status: DC | PRN
  Administered 2020-04-04 – 2020-04-05 (×3): 50 mg via ORAL
  Filled 2020-04-03 (×3): qty 1

## 2020-04-03 MED ORDER — CHLORHEXIDINE GLUCONATE 0.12% ORAL RINSE (MEDLINE KIT)
15.0000 mL | Freq: Two times a day (BID) | OROMUCOSAL | Status: DC
  Administered 2020-04-03 – 2020-04-11 (×15): 15 mL via OROMUCOSAL

## 2020-04-03 MED ORDER — NITROGLYCERIN IN D5W 200-5 MCG/ML-% IV SOLN: 0.0000 ug/min | INTRAVENOUS | Status: DC

## 2020-04-03 MED ORDER — SODIUM CHLORIDE 0.9 % IV SOLN: 250.0000 mL | INTRAVENOUS | Status: DC

## 2020-04-03 MED ORDER — METOPROLOL TARTRATE 25 MG/10 ML ORAL SUSPENSION: 12.5000 mg | Freq: Two times a day (BID) | ORAL | Status: DC

## 2020-04-03 MED ORDER — SODIUM CHLORIDE 0.9 % IV SOLN: INTRAVENOUS | Status: DC

## 2020-04-03 MED ORDER — ROCURONIUM BROMIDE 10 MG/ML (PF) SYRINGE
PREFILLED_SYRINGE | INTRAVENOUS | Status: DC | PRN
  Administered 2020-04-03: 50 mg via INTRAVENOUS
  Administered 2020-04-03: 100 mg via INTRAVENOUS
  Administered 2020-04-03 (×2): 50 mg via INTRAVENOUS

## 2020-04-03 MED ORDER — DEXTROSE 50 % IV SOLN: 0.0000 mL | INTRAVENOUS | Status: DC | PRN

## 2020-04-03 MED ORDER — DOCUSATE SODIUM 100 MG PO CAPS
200.0000 mg | ORAL_CAPSULE | Freq: Every day | ORAL | Status: DC
  Administered 2020-04-04 – 2020-04-05 (×2): 200 mg via ORAL
  Filled 2020-04-03 (×2): qty 2

## 2020-04-03 MED ORDER — TRANEXAMIC ACID 1000 MG/10ML IV SOLN
1.5000 mg/kg/h | INTRAVENOUS | Status: DC
  Filled 2020-04-03: qty 25

## 2020-04-03 SURGICAL SUPPLY — 121 items
ADAPTER CARDIO PERF ANTE/RETRO (ADAPTER) ×4 IMPLANT
ADH SKN CLS APL DERMABOND .7 (GAUZE/BANDAGES/DRESSINGS) ×3
ADPR PRFSN 84XANTGRD RTRGD (ADAPTER) ×3
APL PRP STRL LF DISP 70% ISPRP (MISCELLANEOUS) ×12
APL SKNCLS STERI-STRIP NONHPOA (GAUZE/BANDAGES/DRESSINGS) ×3
BAG DECANTER FOR FLEXI CONT (MISCELLANEOUS) ×4 IMPLANT
BATTERY MAXDRIVER (MISCELLANEOUS) ×4 IMPLANT
BENZOIN TINCTURE PRP APPL 2/3 (GAUZE/BANDAGES/DRESSINGS) ×4 IMPLANT
BINDER BREAST 3XL (GAUZE/BANDAGES/DRESSINGS) ×4 IMPLANT
BLADE CLIPPER SURG (BLADE) IMPLANT
BLADE STERNUM SYSTEM 6 (BLADE) ×4 IMPLANT
BLADE SURG 15 STRL LF DISP TIS (BLADE) ×3 IMPLANT
BLADE SURG 15 STRL SS (BLADE) ×4
BNDG ELASTIC 4X5.8 VLCR STR LF (GAUZE/BANDAGES/DRESSINGS) ×4 IMPLANT
BNDG ELASTIC 6X5.8 VLCR STR LF (GAUZE/BANDAGES/DRESSINGS) ×4 IMPLANT
BNDG GAUZE ELAST 4 BULKY (GAUZE/BANDAGES/DRESSINGS) ×4 IMPLANT
CANISTER SUCT 3000ML PPV (MISCELLANEOUS) ×4 IMPLANT
CANISTER WOUNDNEG PRESSURE 500 (CANNISTER) ×4 IMPLANT
CANNULA GUNDRY RCSP 15FR (MISCELLANEOUS) ×4 IMPLANT
CANNULA MC2 2 STG 29/37 NON-V (CANNULA) ×3 IMPLANT
CANNULA MC2 TWO STAGE (CANNULA) ×1
CANNULA NON VENT 20FR 12 (CANNULA) ×4 IMPLANT
CANNULA SUMP PERICARDIAL (CANNULA) ×4 IMPLANT
CATH CPB KIT HENDRICKSON (MISCELLANEOUS) ×4 IMPLANT
CATH HEART VENT LEFT (CATHETERS) ×3 IMPLANT
CATH ROBINSON RED A/P 18FR (CATHETERS) ×12 IMPLANT
CHLORAPREP W/TINT 26 (MISCELLANEOUS) ×16 IMPLANT
CLIP VESOCCLUDE SM WIDE 24/CT (CLIP) ×12 IMPLANT
CNTNR URN SCR LID CUP LEK RST (MISCELLANEOUS) ×3 IMPLANT
CONN ST 1/2X1/2  BEN (MISCELLANEOUS) ×1
CONN ST 1/2X1/2 BEN (MISCELLANEOUS) ×3 IMPLANT
CONN ST 1/4X3/8  BEN (MISCELLANEOUS) ×2
CONN ST 1/4X3/8 BEN (MISCELLANEOUS) ×6 IMPLANT
CONNECTOR BLAKE 2:1 CARIO BLK (MISCELLANEOUS) ×8 IMPLANT
CONT SPEC 4OZ STRL OR WHT (MISCELLANEOUS) ×4
COVER SURGICAL LIGHT HANDLE (MISCELLANEOUS) ×4 IMPLANT
DERMABOND ADVANCED (GAUZE/BANDAGES/DRESSINGS) ×1
DERMABOND ADVANCED .7 DNX12 (GAUZE/BANDAGES/DRESSINGS) ×3 IMPLANT
DEVICE SUT CK QUICK LOAD MINI (Prosthesis & Implant Heart) ×4 IMPLANT
DRAIN CHANNEL 28F RND 3/8 FF (WOUND CARE) IMPLANT
DRAIN CONNECTOR BLAKE 1:1 (MISCELLANEOUS) ×4 IMPLANT
DRAPE CARDIOVASCULAR INCISE (DRAPES) ×4
DRAPE SLUSH/WARMER DISC (DRAPES) ×4 IMPLANT
DRAPE SRG 135X102X78XABS (DRAPES) ×3 IMPLANT
DRESSING PEEL AND PLC PRVNA 13 (GAUZE/BANDAGES/DRESSINGS) ×3 IMPLANT
DRSG COVADERM 4X14 (GAUZE/BANDAGES/DRESSINGS) IMPLANT
DRSG PEEL AND PLACE PREVENA 13 (GAUZE/BANDAGES/DRESSINGS) ×4
ELECT BLADE 4.0 EZ CLEAN MEGAD (MISCELLANEOUS) ×4
ELECT CAUTERY BLADE 6.4 (BLADE) ×4 IMPLANT
ELECT REM PT RETURN 9FT ADLT (ELECTROSURGICAL) ×8
ELECTRODE BLDE 4.0 EZ CLN MEGD (MISCELLANEOUS) ×3 IMPLANT
ELECTRODE REM PT RTRN 9FT ADLT (ELECTROSURGICAL) ×6 IMPLANT
FELT TEFLON 1X6 (MISCELLANEOUS) ×8 IMPLANT
GAUZE SPONGE 4X4 12PLY STRL (GAUZE/BANDAGES/DRESSINGS) ×4 IMPLANT
GAUZE SPONGE 4X4 12PLY STRL LF (GAUZE/BANDAGES/DRESSINGS) ×8 IMPLANT
GLOVE BIO SURGEON STRL SZ 6 (GLOVE) ×8 IMPLANT
GLOVE BIO SURGEON STRL SZ 6.5 (GLOVE) IMPLANT
GLOVE BIO SURGEON STRL SZ7 (GLOVE) ×16 IMPLANT
GLOVE BIO SURGEON STRL SZ7.5 (GLOVE) ×8 IMPLANT
GLOVE BIOGEL PI IND STRL 6 (GLOVE) ×18 IMPLANT
GLOVE BIOGEL PI INDICATOR 6 (GLOVE) ×6
GLOVE EUDERMIC 7 POWDERFREE (GLOVE) ×8 IMPLANT
GLOVE SURG PR MICRO ENCORE 7 (GLOVE) ×12 IMPLANT
GLOVE SURG UNDER POLY LF SZ6.5 (GLOVE) ×12 IMPLANT
GLOVE SURG UNDER POLY LF SZ7 (GLOVE) ×4 IMPLANT
GOWN STRL REUS W/ TWL LRG LVL3 (GOWN DISPOSABLE) ×30 IMPLANT
GOWN STRL REUS W/ TWL XL LVL3 (GOWN DISPOSABLE) ×12 IMPLANT
GOWN STRL REUS W/TWL LRG LVL3 (GOWN DISPOSABLE) ×40
GOWN STRL REUS W/TWL XL LVL3 (GOWN DISPOSABLE) ×16
GRAFT WOVEN D/V 28DX30L (Vascular Products) ×4 IMPLANT
HEMOSTAT POWDER SURGIFOAM 1G (HEMOSTASIS) ×8 IMPLANT
HEMOSTAT SURGICEL 2X14 (HEMOSTASIS) ×4 IMPLANT
INSERT SUTURE HOLDER (MISCELLANEOUS) ×4 IMPLANT
KIT BASIN OR (CUSTOM PROCEDURE TRAY) ×4 IMPLANT
KIT SUCTION CATH 14FR (SUCTIONS) ×4 IMPLANT
KIT SUT CK MINI COMBO 4X17 (Prosthesis & Implant Heart) ×4 IMPLANT
KIT TURNOVER KIT B (KITS) ×4 IMPLANT
LINE VENT (MISCELLANEOUS) ×4 IMPLANT
NS IRRIG 1000ML POUR BTL (IV SOLUTION) ×24 IMPLANT
PACK E OPEN HEART (SUTURE) ×4 IMPLANT
PACK OPEN HEART (CUSTOM PROCEDURE TRAY) ×4 IMPLANT
PAD ARMBOARD 7.5X6 YLW CONV (MISCELLANEOUS) IMPLANT
PLATE STERNAL 2.3X208 14H 2-PK (Plate) ×4 IMPLANT
POSITIONER HEAD DONUT 9IN (MISCELLANEOUS) ×4 IMPLANT
PUNCH AORTIC ROTATE 4.0MM (MISCELLANEOUS) ×4 IMPLANT
SCREW LOCKING TI 2.3X11MM (Screw) ×80 IMPLANT
SET CARDIOPLEGIA MPS 5001102 (MISCELLANEOUS) ×4 IMPLANT
SOAP 2 % CHG 4 OZ (WOUND CARE) ×8 IMPLANT
SOL ANTI FOG 6CC (MISCELLANEOUS) ×3 IMPLANT
SOLUTION ANTI FOG 6CC (MISCELLANEOUS) ×1
SPONGE LAP 18X18 RF (DISPOSABLE) ×8 IMPLANT
SUT BONE WAX W31G (SUTURE) ×4 IMPLANT
SUT EB EXC GRN/WHT 2-0 V-5 (SUTURE) ×16 IMPLANT
SUT ETHIBON EXCEL 2-0 V-5 (SUTURE) ×28 IMPLANT
SUT ETHIBOND V-5 VALVE (SUTURE) ×28 IMPLANT
SUT ETHILON 3 0 FSL (SUTURE) ×8 IMPLANT
SUT MNCRL AB 4-0 PS2 18 (SUTURE) ×4 IMPLANT
SUT PROLENE 3 0 SH DA (SUTURE) IMPLANT
SUT PROLENE 3 0 SH1 36 (SUTURE) ×4 IMPLANT
SUT PROLENE 4 0 RB 1 (SUTURE) ×16
SUT PROLENE 4 0 SH DA (SUTURE) ×4 IMPLANT
SUT PROLENE 4-0 RB1 .5 CRCL 36 (SUTURE) ×12 IMPLANT
SUT PROLENE 5 0 C 1 36 (SUTURE) ×12 IMPLANT
SUT PROLENE 7 0 BV 1 (SUTURE) ×12 IMPLANT
SUT STEEL 6MS V (SUTURE) ×8 IMPLANT
SUT VIC AB 2-0 CT1 27 (SUTURE) ×8
SUT VIC AB 2-0 CT1 TAPERPNT 27 (SUTURE) ×6 IMPLANT
SUT VIC AB 3-0 SH 27 (SUTURE) ×8
SUT VIC AB 3-0 SH 27X BRD (SUTURE) ×6 IMPLANT
SYSTEM HEARTSTRING SEAL 4.3 (VASCULAR PRODUCTS) ×6 IMPLANT
SYSTEM HEARTSTRING SEAL 4.3MM (VASCULAR PRODUCTS) ×8
SYSTEM SAHARA CHEST DRAIN ATS (WOUND CARE) ×4 IMPLANT
TAPE CLOTH SURG 4X10 WHT LF (GAUZE/BANDAGES/DRESSINGS) ×8 IMPLANT
TOWEL GREEN STERILE (TOWEL DISPOSABLE) ×4 IMPLANT
TOWEL GREEN STERILE FF (TOWEL DISPOSABLE) ×4 IMPLANT
TRAY FOLEY SLVR 16FR TEMP STAT (SET/KITS/TRAYS/PACK) ×4 IMPLANT
UNDERPAD 30X36 HEAVY ABSORB (UNDERPADS AND DIAPERS) ×4 IMPLANT
VALVE AORTIC SZ21 INSP/RESIL (Valve) ×4 IMPLANT
VENT LEFT HEART 12002 (CATHETERS) ×4
WATER STERILE IRR 1000ML POUR (IV SOLUTION) ×8 IMPLANT
YANKAUER SUCT BULB TIP NO VENT (SUCTIONS) ×4 IMPLANT

## 2020-04-03 NOTE — Anesthesia Procedure Notes (Signed)
Central Venous Catheter Insertion Performed by: Murvin Natal, MD, anesthesiologist Start/End1/05/2020 7:45 AM, 04/03/2020 8:00 AM Patient location: Pre-op. Preanesthetic checklist: patient identified, IV checked, site marked, risks and benefits discussed, surgical consent, monitors and equipment checked, pre-op evaluation, timeout performed and anesthesia consent Position: Trendelenburg Lidocaine 1% used for infiltration and patient sedated Hand hygiene performed , maximum sterile barriers used  and Seldinger technique used Catheter size: 9 Fr Total catheter length 12. Central line was placed.MAC introducer Procedure performed using ultrasound guided technique. Ultrasound Notes:anatomy identified, needle tip was noted to be adjacent to the nerve/plexus identified, no ultrasound evidence of intravascular and/or intraneural injection and image(s) printed for medical record Attempts: 1 Following insertion, line sutured, dressing applied and Biopatch. Post procedure assessment: blood return through all ports, free fluid flow and no air  Patient tolerated the procedure well with no immediate complications.

## 2020-04-03 NOTE — Discharge Summary (Incomplete)
Physician Discharge Summary       [address]       [place] [id number]             [phone]    Patient ID: Heidi Morse MRN: [id number] DOB/AGE: [birth year] [age] y.o.  Admit date: 03/28/2020 Discharge date: 04/03/2020  Admission Diagnoses: 1. Unstable angina (HCC) 2. Coronary artery disease 3. Aortic valve stenosis  Discharge Diagnoses:  1. S/pCABG x 2, aortic root enlargement, and aortic valve replacement 2. History of  symptomatic anemia and GI bleed 3. History of COPD 4. History of CHF (congestive heart failure) (HCC) 5. History of Diabetes mellitus without complication (HCC) 6. History of Hypertension 7. History of OSA 8. History of MI (myocardial infarction) (HCC)  Consults: {consultation:18241}  Procedure (s): ***  History of Presenting Illness: This is a 65 year old female we are asked to see in cardiothoracic surgical consultation for consideration of CABG.  She has a previous medical history that includes CHF status post myocardial infarction as well as a previous ostial LAD stent in March 2021.  Additionally she has a history of hypertension, insulin-dependent diabetes mellitus type 2, COPD, asthma, sleep apnea, history of small bowel AVM, and diverticulosis.  She presented to the ER at [facility] on 03/28/2020 with worsening chest pain.  The chest pain has been transient and is primarily with activity but on presentation she had some chest pain that was at rest.  This was relieved with nitro.  She had a recent hospitalization at [facility] where she presented with anemia and hemoglobin was between 7 and 8.  Capsule endoscopy showed bleeding of AVM in the small bowel.  She did not require transfusion during this most recent hospitalization but has required multiple transfusions in the past for GI bleeding.  She was discharged home with plans for future endoscopy at a tertiary center.  At the time of her discharge her hemoglobin was stable with no  evidence of active bleeding.  She did continue to have chest pain with exertion and presented to the ER.  In the ER she was found to have a hemoglobin of 7.6 and subsequently was transfused.  GI consultation was also obtained.  There are currently no plans for further intervention here but it is felt that she may  require a tertiary center where they can do deep enteroscopy.  Due to her previous stent procedures and chest pain cardiology consultation was also obtained.  Initial EKG and troponins were not worrisome for findings consistent with ischemia but initial findings were felt to be significant as unstable angina.  Cardiac catheterization was done today and she was found to have a distal left main to ostial LAD 60% stenosis and an ostial circumflex lesion of 99%.  Please see the full report listed below.  The RCA has only minor disease with a 40% stenosis in the mid RCA. She additionally has an echocardiogram which shows an EF of 45 to 50%, a  calcified aortic valve which is moderately stenotic, and moderate mitral valve regurgitation. On review of her left heart cath she has good targets for surgical bypass, and given the location of her stents I do think that surgical revascularization will be a good option.  I do think that an aortic valve replacement at the time of surgery is also warranted.  In regards to her recurrent GI bleeds, I question whether this is related to an acquired von Willebrand's deficiency from her aortic valve.  Typically this is seen  in an critical aortic stenosis, but her valve is heavily calcified.  In regards to her mitral valve, I think that relieving the pressure from her aortic valve, as well as surgical revascularization should help in treating the mitral valve regurgitation. After reviewing an orthopanogram and P2Y12, patient was stable for surgery. She underwent a ** on 04/03/2020.  Brief Hospital Course: ***    Latest Vital Signs: Blood pressure 126/71, pulse 67,  temperature 98.8 F (37.1 C), temperature source Oral, resp. rate 18, height 5' (1.524 m), weight 101.3 kg, SpO2 100 %.  Physical Exam:***  Discharge Condition:Stable and discharged to home.  Recent laboratory studies:  Lab Results  Component Value Date   WBC 11.0 (H) 04/03/2020   HGB 9.6 (L) 04/03/2020   HCT 31.1 (L) 04/03/2020   MCV 86.9 04/03/2020   PLT 250 04/03/2020   Lab Results  Component Value Date   NA 141 04/03/2020   K 3.6 04/03/2020   CL 106 04/03/2020   CO2 23 04/03/2020   CREATININE 0.74 04/03/2020   GLUCOSE 135 (H) 04/03/2020      Diagnostic Studies: DG Orthopantogram  Result Date: 03/30/2020 CLINICAL DATA:  Aortic stenosis EXAM: ORTHOPANTOGRAM/PANORAMIC COMPARISON:  None. FINDINGS: The roots of the maxillary incisors are out of focus and not well assessed on this examination. There are multiple missing teeth. There is dental amalgam within the residual left maxillary and right mandibular molars. No periapical abscess identified. Small dental carie noted within the mesial cortex of tooth 3. No mandibular fracture or dislocation. IMPRESSION: Dental carry noted within the mesial cortex of tooth 3. Electronically Signed   By: Helyn Numbers MD   On: 03/30/2020 23:15   DG Chest 2 View  Result Date: 03/28/2020 CLINICAL DATA:  Chest pain 2 weeks EXAM: CHEST - 2 VIEW COMPARISON:  03/24/2020 and prior. FINDINGS: Cardiomegaly and central pulmonary vascular congestion. Patchy bibasilar opacities. No pneumothorax or pleural effusion. No acute osseous abnormality. IMPRESSION: Patchy basilar opacities may reflect atelectasis, edema or infection. Cardiomegaly and central pulmonary vascular congestion. Electronically Signed   By: Stana Bunting M.D.   On: 03/28/2020 08:00   CARDIAC CATHETERIZATION  Result Date: 03/30/2020  Prox RCA to Mid RCA lesion is 40% stenosed.  Ost Cx lesion is 99% stenosed.  Dist LM to Ost LAD lesion is 60% stenosed.  1. Patent left main stent  with distal moderate restenosis. The stent extends into the LAD. There is haziness in the ostium of the LAD suggesting at least moderate restenosis of the stent. Another stent extends from the left main into the Circumflex. Severe ostial Circumflex restenosis. Good targets for bypass in the obtuse marginal branches and mid LAD. Recommendations: She has restenosis of the distal left main stented segment, ostial Circumflex stented segment and ostial LAD stented segment. She has diabetes and good targets for bypass. While balloon angioplasty of the severely restenotic segments is possible, there would not be a good long term result with high chance of recurrent restenosis. I have reviewed her images with Dr. Antoine Poche. I think the best option is to consider bypass surgery. She is relatively young and a diabetic. Will ask CT surgery to see her. Also note that she has received Plavix today and has ongoing issues with anemia/GI bleeding. Plavix will be held going forward in anticipation of bypass surgery.   ECHOCARDIOGRAM COMPLETE  Result Date: 03/29/2020    ECHOCARDIOGRAM REPORT   Patient Name:   Heidi Morse Date of Exam: 03/29/2020 Medical Rec #:  [id number]  Height:       60.0 in Accession #:    [id number]     Weight:       223.6 lb Date of Birth:  06/22/1955      BSA:          1.957 m Patient Age:    64 years       BP:           126/73 mmHg Patient Gender: F              HR:           73 bpm. Exam Location:  Inpatient Procedure: 2D Echo Indications:    Chest Pain  History:        Patient has no prior history of Echocardiogram examinations.                 CHF, Previous Myocardial Infarction, COPD; Risk                 Factors:Hypertension and Diabetes.  Sonographer:    Thurman Coyer RDCS (AE) Referring Phys: [id number] Elwin Mocha RAINES IMPRESSIONS  1. Left ventricular ejection fraction, by estimation, is 45 to 50%. The left ventricle has mildly decreased function. The left ventricle demonstrates global  hypokinesis. There is mild left ventricular hypertrophy. Left ventricular diastolic parameters are consistent with Grade II diastolic dysfunction (pseudonormalization). Elevated left atrial pressure.  2. Right ventricular systolic function is normal. The right ventricular size is normal. There is normal pulmonary artery systolic pressure. The estimated right ventricular systolic pressure is 32.4 mmHg.  3. The mitral valve is abnormal. Moderate mitral valve regurgitation. Moderate mitral annular calcification.  4. The aortic valve is tricuspid. There is moderate thickening of the aortic valve. Aortic valve regurgitation is mild to moderate. Moderate aortic valve stenosis. Vmax 3.4 m/s, MG 24 mmHg, AVA 0.9 cm^2, DI 0.34  5. The inferior vena cava is normal in size with greater than 50% respiratory variability, suggesting right atrial pressure of 3 mmHg. FINDINGS  Left Ventricle: Left ventricular ejection fraction, by estimation, is 45 to 50%. The left ventricle has mildly decreased function. The left ventricle demonstrates global hypokinesis. The left ventricular internal cavity size was normal in size. There is  mild left ventricular hypertrophy. Left ventricular diastolic parameters are consistent with Grade II diastolic dysfunction (pseudonormalization). Elevated left atrial pressure. Right Ventricle: The right ventricular size is normal. No increase in right ventricular wall thickness. Right ventricular systolic function is normal. There is normal pulmonary artery systolic pressure. The tricuspid regurgitant velocity is 2.71 m/s, and  with an assumed right atrial pressure of 3 mmHg, the estimated right ventricular systolic pressure is 32.4 mmHg. Left Atrium: Left atrial size was normal in size. Right Atrium: Right atrial size was normal in size. Pericardium: There is no evidence of pericardial effusion. Mitral Valve: The mitral valve is abnormal. Moderate mitral annular calcification. Moderate mitral valve  regurgitation. Tricuspid Valve: The tricuspid valve is normal in structure. Tricuspid valve regurgitation is trivial. Aortic Valve: The aortic valve is tricuspid. There is moderate thickening of the aortic valve. Aortic valve regurgitation is mild to moderate. Aortic regurgitation PHT measures 255 msec. Moderate aortic stenosis is present. Aortic valve mean gradient measures 22.5 mmHg. Aortic valve peak gradient measures 44.2 mmHg. Aortic valve area, by VTI measures 0.93 cm. Pulmonic Valve: The pulmonic valve was not well visualized. Pulmonic valve regurgitation is trivial. Aorta: The aortic root and ascending aorta are structurally normal, with no evidence of dilitation.  Venous: The inferior vena cava is normal in size with greater than 50% respiratory variability, suggesting right atrial pressure of 3 mmHg. IAS/Shunts: The interatrial septum was not well visualized.  LEFT VENTRICLE PLAX 2D LVIDd:         5.60 cm  Diastology LVIDs:         3.90 cm  LV e' medial:    5.44 cm/s LV PW:         1.10 cm  LV E/e' medial:  25.0 LV IVS:        1.10 cm  LV e' lateral:   4.46 cm/s LVOT diam:     1.80 cm  LV E/e' lateral: 30.5 LV SV:         65 LV SV Index:   33 LVOT Area:     2.54 cm  LEFT ATRIUM             Index       RIGHT ATRIUM           Index LA diam:        4.50 cm 2.30 cm/m  RA Area:     16.10 cm LA Vol (A2C):   83.3 ml 42.56 ml/m RA Volume:   38.70 ml  19.77 ml/m LA Vol (A4C):   21.4 ml 10.93 ml/m LA Biplane Vol: 44.7 ml 22.84 ml/m  AORTIC VALVE AV Area (Vmax):    0.83 cm AV Area (Vmean):   0.94 cm AV Area (VTI):     0.93 cm AV Vmax:           332.50 cm/s AV Vmean:          216.000 cm/s AV VTI:            0.704 m AV Peak Grad:      44.2 mmHg AV Mean Grad:      22.5 mmHg LVOT Vmax:         109.00 cm/s LVOT Vmean:        79.400 cm/s LVOT VTI:          0.256 m LVOT/AV VTI ratio: 0.36 AI PHT:            255 msec  AORTA Ao Root diam: 2.20 cm MITRAL VALVE                 TRICUSPID VALVE MV Area (PHT): 3.72 cm       TR Peak grad:   29.4 mmHg MV Decel Time: 204 msec      TR Vmax:        271.00 cm/s MR Peak grad:    114.1 mmHg MR Mean grad:    72.0 mmHg   SHUNTS MR Vmax:         534.00 cm/s Systemic VTI:  0.26 m MR Vmean:        398.0 cm/s  Systemic Diam: 1.80 cm MR PISA:         2.26 cm MR PISA Eff ROA: 17 mm MR PISA Radius:  0.60 cm MV E velocity: 136.00 cm/s MV A velocity: 110.00 cm/s MV E/A ratio:  1.24 Epifanio Lescheshristopher Schumann MD Electronically signed by Epifanio Lescheshristopher Schumann MD Signature Date/Time: 03/29/2020/10:08:09 PM    Final    VAS US DOPPLER PRE CABG  Result Date: 04/01/2020 PREOPERATIVE VASCULAR EVALUATION  Indications:            Pre-CABG. Risk Factors:           Hypertension, Diabetes. Vascular Interventions: Left ICA stent.  Limitations:            patient movement Comparison Study:       No prior studies. Performing Technologist: Olen Cordial RVT  Examination Guidelines: A complete evaluation includes B-mode imaging, spectral Doppler, color Doppler, and power Doppler as needed of all accessible portions of each vessel. Bilateral testing is considered an integral part of a complete examination. Limited examinations for reoccurring indications may be performed as noted.  Right Carotid Findings: +----------+--------+--------+--------+-----------------------+--------+           PSV cm/sEDV cm/sStenosisDescribe               Comments +----------+--------+--------+--------+-----------------------+--------+ CCA Prox  68      14                                              +----------+--------+--------+--------+-----------------------+--------+ CCA Distal90      14              smooth and heterogenous         +----------+--------+--------+--------+-----------------------+--------+ ICA Prox  184     42      40-59%  calcific                        +----------+--------+--------+--------+-----------------------+--------+ ICA Mid   194     58      40-59%  calcific                         +----------+--------+--------+--------+-----------------------+--------+ ICA Distal85      28                                     tortuous +----------+--------+--------+--------+-----------------------+--------+ ECA       114     14                                              +----------+--------+--------+--------+-----------------------+--------+ Portions of this table do not appear on this page. +----------+--------+-------+--------+------------+           PSV cm/sEDV cmsDescribeArm Pressure +----------+--------+-------+--------+------------+ Subclavian167                                 +----------+--------+-------+--------+------------+ +---------+--------+--+--------+--+---------+ VertebralPSV cm/s62EDV cm/s14Antegrade +---------+--------+--+--------+--+---------+ Left Carotid Findings: +----------+--------+--------+--------+-----------------------+--------+           PSV cm/sEDV cm/sStenosisDescribe               Comments +----------+--------+--------+--------+-----------------------+--------+ CCA Prox  82      17              smooth and heterogenous         +----------+--------+--------+--------+-----------------------+--------+ CCA Distal112     32              smooth and heterogenous         +----------+--------+--------+--------+-----------------------+--------+ ICA Prox  120     34                                     Stent    +----------+--------+--------+--------+-----------------------+--------+  ICA Mid   78      26                                     Stent    +----------+--------+--------+--------+-----------------------+--------+ ICA Distal94      30                                     tortuous +----------+--------+--------+--------+-----------------------+--------+ ECA       198     7                                               +----------+--------+--------+--------+-----------------------+--------+  +----------+--------+--------+--------+------------+ SubclavianPSV cm/sEDV cm/sDescribeArm Pressure +----------+--------+--------+--------+------------+           125                                  +----------+--------+--------+--------+------------+ +---------+--------+--+--------+-+---------+ VertebralPSV cm/s28EDV cm/s7Antegrade +---------+--------+--+--------+-+---------+  ABI Findings: +--------+------------------+-----+---------+--------+ Right   Rt Pressure (mmHg)IndexWaveform Comment  +--------+------------------+-----+---------+--------+ [id number]                    triphasic         +--------+------------------+-----+---------+--------+ +--------+------------------+-----+---------+------------+ Left    Lt Pressure (mmHg)IndexWaveform Comment      +--------+------------------+-----+---------+------------+ Brachial                       triphasicUpper arm IV +--------+------------------+-----+---------+------------+  Right Doppler Findings: +--------+--------+-----+---------+--------+ Site    PressureIndexDoppler  Comments +--------+--------+-----+---------+--------+ [id number]          triphasic         +--------+--------+-----+---------+--------+ Radial               triphasic         +--------+--------+-----+---------+--------+ Ulnar                triphasic         +--------+--------+-----+---------+--------+  Left Doppler Findings: +--------+--------+-----+---------+------------+ Site    PressureIndexDoppler  Comments     +--------+--------+-----+---------+------------+ Brachial             triphasicUpper arm IV +--------+--------+-----+---------+------------+ Radial               triphasic             +--------+--------+-----+---------+------------+ Ulnar                triphasic             +--------+--------+-----+---------+------------+  Summary: Right Carotid: Velocities in the right ICA are consistent  with a 40-59%                stenosis. Left Carotid: Velocities in the left ICA are consistent with a less than 50%               stenosis. Vertebrals: Bilateral vertebral arteries demonstrate antegrade flow. Right Upper Extremity: Doppler waveform obliterate with right radial compression. Doppler waveform obliterate with right ulnar compression. Left Upper Extremity: Doppler waveform obliterate with left radial compression. Doppler waveform obliterate with left ulnar compression.    Preliminary        Discharge Instructions    AMB Referral to [facility] Coordinaton  Complete by: As directed    Managed Medicaid [facility] team referral, patient has [employer] needs assistance with follow up, is to have HHPT/RN follow up per inpatient TOC RNCM.  Natividad Brood, RN BSN [facility] Liaison  [phone] business mobile phone Toll free office [phone]  Fax number: [phone] [email or web address]   Reason for Referral: Care Coordination (Managed Medicaid)   MM Services needed: Nurse Case Manager   Diagnoses of: High-risk Medicaid   Expected date of contact: Urgent - 7 Days      Discharge Medications: Allergies as of 04/03/2020      Reactions   Peanut-containing Drug Products Hives, Swelling   Statins Other (See Comments)   myalgia   Iodine Itching, Rash   unk reaction per pt    Med Rec must be completed prior to using this [facility]***      The patient has been discharged on:   1.Beta Blocker:  Yes [   ]                              No   [   ]                              If No, reason:  2.Ace Inhibitor/ARB: Yes [   ]                                     No  [    ]                                     If No, reason:  3.Statin:   Yes [   ]                  No  [   ]                  If No, reason:  4.Ecasa:  Yes  [   ]                  No   [   ]                  If No, reason:  Follow Up  Appointments:  Follow-up Information    Tarri Glenn, PA-C Follow up on 05/02/2020.   Specialty: Gastroenterology Why: 10 PM follow up w GI PA they may call you with soomer appointment if they have a cancellation.   Contact information: [address] [place] [address] [phone]        Lajuana Matte, MD Follow up.   Specialty: Cardiothoracic Surgery Contact information: [place] [address] [phone]               Signed: Sharalyn Ink [facility] 04/03/2020, 9:05 AM

## 2020-04-03 NOTE — Plan of Care (Signed)
  Problem: Pain Managment: Goal: General experience of comfort will improve Outcome: Completed/Met   Problem: Safety: Goal: Ability to remain free from injury will improve Outcome: Completed/Met

## 2020-04-03 NOTE — Transfer of Care (Signed)
Immediate Anesthesia Transfer of Care Note  Patient: Heidi Morse  Procedure(s) Performed: CORONARY ARTERY BYPASS GRAFTING X 2 ON CARDIOPULMONARY BYPASS. AORTIC VALVE REPLACEMENT USING 21 MM INSPIRIS RESILIA AORTIC VALVE, STERNAL PLATING (N/A Chest) ENDOVEIN HARVEST OF GREATER SAPHENOUS VEIN (Right ) TRANSESOPHAGEAL ECHOCARDIOGRAM (TEE) (N/A ) AORTIC ROOT ENLARGEMENT USING HEMASHIELD PLATINUM WOVEN DOUBLE VELOUR VASCULAR GRAFT 28 MM X 30 CM (N/A )  Patient Location: SICU  Anesthesia Type:General  Level of Consciousness: Patient remains intubated per anesthesia plan  Airway & Oxygen Therapy: Patient remains intubated per anesthesia plan and Patient placed on Ventilator (see vital sign flow sheet for setting)  Post-op Assessment: Report given to RN and Post -op Vital signs reviewed and stable  Post vital signs: Reviewed and stable  Last Vitals:  Vitals Value Taken Time  BP    Temp 36.3 C 04/03/20 1627  Pulse 84 04/03/20 1627  Resp 24 04/03/20 1627  SpO2 99 % 04/03/20 1627  Vitals shown include unvalidated device data.  Last Pain:  Vitals:   04/03/20 0504  TempSrc: Oral  PainSc:       Patients Stated Pain Goal: 4 (03/31/20 1142)  Complications: No complications documented.

## 2020-04-03 NOTE — Progress Notes (Signed)
TCTS Evening Rounds  DOS s/p AVR with root enlargement and CABG x 2 HD stable Making good index Labs reviewed BP 100/81 (BP Location: Right Wrist)   Pulse 82   Temp (!) 97.52 F (36.4 C)   Resp (!) 21   Ht 5' (1.524 m)   Wt 101.3 kg   LMP  (LMP Unknown)   SpO2 100%   BMI 43.63 kg/m  Good UOP  Intake/Output Summary (Last 24 hours) at 04/03/2020 1732 Last data filed at 04/03/2020 1700 Gross per 24 hour  Intake 4256.68 ml  Output 4850 ml  Net -593.32 ml   CXR clear lung fields  A/p: continue early postoperative care Heidi Morse Z. Vickey Sages, MD [phone]

## 2020-04-03 NOTE — Discharge Instructions (Signed)

## 2020-04-03 NOTE — OR Nursing (Signed)
Dr. Cliffton Asters in to assess chest tubes drainage. Collection device marked at level assessed.

## 2020-04-03 NOTE — Op Note (Signed)
[address]       [place] [address]             [phone]                                           04/03/2020 Patient:  Wiletta Bermingham Pre-Op Dx: Unstable angina   2V/LM CAD   Moderate aortic stenosis   Class 3 Morbid obesity   Post-op Dx:  same Procedure: Aortic valve replacement with a 48mm Inspiris valve Annular enlargement of the aortic root Coronary artery bypass grafting X 2.  LIMA LAD, RSVG OM1   Intra-operative Transesophageal Echocardiogram  Surgeon and Role:      * Marrie Chandra, Eliezer Lofts, MD - Primary    *Dr. Dorris Fetch, MD- assisting Assistant: Jacques Earthly, PA-C  Anesthesia  general EBL: 500 ml Blood Administration: None Xclamp Time:  124 min Pump Time:   Drains: 19 F blake drain: R, L, mediastinal  Wires: None Counts: correct   Indications: This is a 65 year old female who presents with unstable angina in the setting of coronary artery disease and multiple PCI's.  She has been seen on numerous occasions at [facility] with chest pain but presented to our hospital, and underwent a left heart cath which showed in-stent restenosis of her left main coronary stents.  She additionally has an echocardiogram which shows an EF of 45 to 50%, a  calcified aortic valve which is moderately stenotic, and moderate mitral valve regurgitation.  On review of her left heart cath she has good targets for surgical bypass, and given the location of her stents I do think that surgical revascularization will be a good option.  I do think that an aortic valve replacement at the time of surgery is also warranted.  In regards to her recurrent GI bleeds, I question whether this is related to an acquired von Willebrand's deficiency from her aortic valve.  Typically this is seen in an critical aortic stenosis, but her valve is heavily calcified.  In regards to her mitral valve, I think that relieving the pressure from her aortic valve, as  well as surgical revascularization should help in treating the mitral valve regurgitation.  Findings: Small vein, small LIMA.  Good LAD target.  Intramyocardial obtuse marginal target.  Her aortic valve leaflets were calcified, and she had a very small annulus.  The 21 mm sizer was unable to pass to the annulus.  Given her body habitus, a 19 mm valve would be insufficient then lead to patient-prosthesis.  The annulus was enlarged and we were able to fit a 19 mm valve and comfortably.  Following the procedure she had a mean gradient of 14.  Operative Technique: All invasive lines were placed in pre-op holding.  After the risks, benefits and alternatives were thoroughly discussed, the patient was brought to the operative theatre.  Anesthesia was induced, and the patient was prepped and draped in normal sterile fashion.  An appropriate surgical pause was performed, and pre-operative antibiotics were dosed accordingly.  We began with simultaneous incision over the chest for the sternotomy the right leg for harvesting of the greater saphenous vein.  In regards to the sternotomy, this was carried down with bovie cautery, and the sternum was divided with a reciprocating saw.  Meticulous hemostasis was obtained.    The left internal thoracic  artery was exposed and harvested in in pedicled fashion.  The patient was systemically heparinized, and the artery was divided distally, and placed in a papaverine sponge.    The sternal elevator was removed, and a retractor was placed.  The pericardium was divided in the midline and fashioned into a cradle with pericardial stitches.   After we confirmed an appropriate ACT, the ascending aorta was cannulated in standard fashion.  The right atrial appendage was used for venous cannulation site.  Cardiopulmonary bypass was initiated, and the heart retractor was placed. The cross clamp was applied, and a dose of anterograde cardioplegia was given with good arrest of the heart.  Next we exposed the lateral wall, and found a good target on the OM1.  An end to side anastomosis with the vein graft was then created.  Finally, we exposed a good target on the LAD, and fashioned an end to side anastomosis between it and the LITA.    Another dose of anterograde cardioplegia was given, and then we focused our attention on the aortic valve.  An aortotomy was made and directed toward the non coronary cusp.  The valve was inspected.  The leaflets were calcified..  All leaflets were excised.  The annulus was sized to a 19 mm Inspirus valve but given the patient's BSA she would at least need a 21 mm valve.  At this point I elected to perform an annular and logical procedure.  I carried the aortotomy down through the Left-noncoronary commissure through the root.  I then reconstructed the incision with a Hemashield graft that was cut to size and carried this from the nadir of my incision along both edges of the aortotomy.  I was then able to fit 21 mm sizer through the annulus without any difficulty.    The left ventricle was then copiously irrigated.  Pledgeted mattress sutures were placed circumferentially through the annulus.  These sutures were then passed through the sewing ring of the valve.  Once the valve was seated in the annulus, it was secured with Core-knot sutures.  We began to rewarm, and close our aortotomy in 2 layers.  A re-animation dose of cardioplegia was given.  After de-airing the heart, the aortic cross clamp was removed.  We checked our valve function, and for air using the TEE.  A partial occludding clamp was then placed on the ascending aorta, and we created an end to side anastomosis between it and the proximal vein graft.  The proximal site was marked with a ring. Once we were satisfying, we separated from cardiopulmonary bypass without event.    The heparin was reversed with protamine, and hemostasis was obtained.  Chest tubes and wires were placed, and the sternum was  re-approximated with with sternal wires.  The soft tissue and skin were re-approximated wth absorbable suture.    The patient tolerated the procedure without any immediate complications, and was transferred to the ICU in guarded condition.  Maylon Sailors Keane Scrape

## 2020-04-03 NOTE — Progress Notes (Signed)
  Echocardiogram Echocardiogram Transesophageal has been performed.  Heidi Morse 04/03/2020, 10:03 AM

## 2020-04-03 NOTE — Anesthesia Procedure Notes (Signed)
Procedure Name: Intubation Date/Time: 04/03/2020 9:00 AM Performed by: Amadeo Garnet, CRNA Pre-anesthesia Checklist: Patient identified, Emergency Drugs available, Suction available and Patient being monitored Patient Re-evaluated:Patient Re-evaluated prior to induction Oxygen Delivery Method: Circle system utilized Preoxygenation: Pre-oxygenation with 100% oxygen Induction Type: IV induction Ventilation: Mask ventilation without difficulty and Oral airway inserted - appropriate to patient size Laryngoscope Size: Glidescope, Mac, 4 and 3 Grade View: Grade II Tube type: Oral Tube size: 7.5 mm Number of attempts: 1 Airway Equipment and Method: Stylet and Oral airway Placement Confirmation: ETT inserted through vocal cords under direct vision,  positive ETCO2 and breath sounds checked- equal and bilateral Secured at: 22 cm Tube secured with: Tape Dental Injury: Teeth and Oropharynx as per pre-operative assessment  Comments: DL x1 with MAC 3, only able to visualize arytenoids; DL x1 with glidescope 3- grade 1 view- ETT passed atraumatically

## 2020-04-03 NOTE — OR Nursing (Signed)
Dr. Cliffton Asters made aware via text messaging that patient has 150 cc of output from chest tubes.

## 2020-04-03 NOTE — Anesthesia Procedure Notes (Signed)
Arterial Line Insertion Start/End1/05/2020 8:00 AM, 04/03/2020 8:10 AM Performed by: Colon Flattery, CRNA, CRNA  Patient location: Pre-op. Preanesthetic checklist: patient identified, IV checked, site marked, risks and benefits discussed, surgical consent, monitors and equipment checked, pre-op evaluation, timeout performed and anesthesia consent Lidocaine 1% used for infiltration and patient sedated Left, radial was placed Catheter size: 20 G Hand hygiene performed  and maximum sterile barriers used   Attempts: 2 Procedure performed without using ultrasound guided technique. Following insertion, Biopatch and dressing applied. Post procedure assessment: normal  Patient tolerated the procedure well with no immediate complications.

## 2020-04-03 NOTE — Brief Op Note (Addendum)
03/28/2020 - 04/03/2020  12:38 PM  PATIENT:  Heidi Morse  65 y.o. female  PRE-OPERATIVE DIAGNOSIS:  1. Severe aortic valve stenosis, 2. Coronary artery disease  POST-OPERATIVE DIAGNOSIS:  1. Severe aortic valve stenosis, 2. Coronary artery disease  PROCEDURE:  TRANSESOPHAGEAL ECHOCARDIOGRAM (TEE),  Coronary Artery Bypass Grafting (CABG) X 2 (LIMA to LAD and SVG to OM1) with ENDOVEIN HARVEST OF RIGHT GREATER SAPHENOUS VEIN, ENLARGEMENT of the AORTIC ROOT (using a portion of Hema shield graft), and AORTIC VALVE REPLACEMENT (AVR) (using an Inspiris Resilia Aortic Valve, Model# 11500A, Serial# [id number], size 21 mm) EVH HARVEST TIME: 50 minutes;EVH PREP TIME: 14 minutes  SURGEON:  Surgeon(s) and Role:    Lightfoot, Eliezer Lofts, MD - Primary    Loreli Slot, MD - Assisting  PHYSICIAN ASSISTANT: Doree Fudge PA-C  ASSISTANTS: Winfield Cunas RNFA  ANESTHESIA:   general  EBL:  Per perfusion, anesthesia record  DRAINS: Chest tubes placed in the mediastinal and pleural spaces   SPECIMEN:  Source of Specimen:  Native aortic valve leaflets  DISPOSITION OF SPECIMEN:  PATHOLOGY  COUNTS CORRECT:  YES  DICTATION: .Dragon Dictation  PLAN OF CARE: Admit to inpatient   PATIENT DISPOSITION:  ICU - intubated and hemodynamically stable.   Delay start of Pharmacological VTE agent (>24hrs) due to surgical blood loss or risk of bleeding: yes  BASELINE WEIGHT: 101.3 kg

## 2020-04-03 NOTE — Anesthesia Postprocedure Evaluation (Signed)
Anesthesia Post Note  Patient: Desia Saban  Procedure(s) Performed: CORONARY ARTERY BYPASS GRAFTING X 2 ON CARDIOPULMONARY BYPASS. AORTIC VALVE REPLACEMENT USING 21 MM INSPIRIS RESILIA AORTIC VALVE, STERNAL PLATING (N/A Chest) ENDOVEIN HARVEST OF GREATER SAPHENOUS VEIN (Right ) TRANSESOPHAGEAL ECHOCARDIOGRAM (TEE) (N/A ) AORTIC ROOT ENLARGEMENT USING HEMASHIELD PLATINUM WOVEN DOUBLE VELOUR VASCULAR GRAFT 28 MM X 30 CM (N/A )     Patient location during evaluation: SICU Anesthesia Type: General Level of consciousness: sedated Pain management: pain level controlled Vital Signs Assessment: post-procedure vital signs reviewed and stable Respiratory status: patient remains intubated per anesthesia plan Cardiovascular status: stable Postop Assessment: no apparent nausea or vomiting Anesthetic complications: no   No complications documented.  Last Vitals:  Vitals:   04/03/20 1700 04/03/20 1715  BP: 100/81   Pulse: 83 82  Resp: (!) 24 (!) 21  Temp: (!) 36.4 C (!) 36.4 C  SpO2: 100% 100%    Last Pain:  Vitals:   04/03/20 1700  TempSrc: Bladder  PainSc:                  Catheryn Bacon Shenay Torti

## 2020-04-03 NOTE — Progress Notes (Signed)
     [address]       [place] [address]             [phone]       Anxiety this am  Vitals:   04/02/20 2300 04/03/20 0504  BP: 125/61 126/71  Pulse: 76 67  Resp: 20 18  Temp: 98.5 F (36.9 C) 98.8 F (37.1 C)  SpO2: 99% 100%   Alert NAD Sinus EWOB  65 yo female with 2V CAD, and moderate AS OR today for bioAVR/CABG2  Myrle Dues O Hunter Pinkard

## 2020-04-04 ENCOUNTER — Encounter (HOSPITAL_COMMUNITY): Payer: Self-pay | Admitting: Certified Registered"

## 2020-04-04 ENCOUNTER — Encounter (HOSPITAL_COMMUNITY): Payer: Self-pay | Admitting: Thoracic Surgery (Cardiothoracic Vascular Surgery)

## 2020-04-04 ENCOUNTER — Inpatient Hospital Stay (HOSPITAL_COMMUNITY): Payer: Medicaid Other

## 2020-04-04 LAB — MAGNESIUM
Magnesium: 2.4 mg/dL (ref 1.7–2.4)
Magnesium: 2.5 mg/dL — ABNORMAL HIGH (ref 1.7–2.4)

## 2020-04-04 LAB — POCT I-STAT 7, (LYTES, BLD GAS, ICA,H+H)
Acid-base deficit: 20 mmol/L — ABNORMAL HIGH (ref 0.0–2.0)
Acid-base deficit: 3 mmol/L — ABNORMAL HIGH (ref 0.0–2.0)
Acid-base deficit: 6 mmol/L — ABNORMAL HIGH (ref 0.0–2.0)
Bicarbonate: 10.3 mmol/L — ABNORMAL LOW (ref 20.0–28.0)
Bicarbonate: 21.3 mmol/L (ref 20.0–28.0)
Bicarbonate: 17.9 mmol/L — ABNORMAL LOW (ref 20.0–28.0)
Calcium, Ion: 1.18 mmol/L (ref 1.15–1.40)
Calcium, Ion: 1.12 mmol/L — ABNORMAL LOW (ref 1.15–1.40)
Calcium, Ion: 1.09 mmol/L — ABNORMAL LOW (ref 1.15–1.40)
HCT: 25 % — ABNORMAL LOW (ref 36.0–46.0)
HCT: 26 % — ABNORMAL LOW (ref 36.0–46.0)
HCT: 22 % — ABNORMAL LOW (ref 36.0–46.0)
Hemoglobin: 8.5 g/dL — ABNORMAL LOW (ref 12.0–15.0)
Hemoglobin: 8.8 g/dL — ABNORMAL LOW (ref 12.0–15.0)
Hemoglobin: 7.5 g/dL — ABNORMAL LOW (ref 12.0–15.0)
O2 Saturation: 90 %
O2 Saturation: 97 %
O2 Saturation: 96 %
Patient temperature: 36.3
Patient temperature: 37.7
Patient temperature: 38
Potassium: 4.2 mmol/L (ref 3.5–5.1)
Potassium: 3.9 mmol/L (ref 3.5–5.1)
Potassium: 3.8 mmol/L (ref 3.5–5.1)
Sodium: 144 mmol/L (ref 135–145)
Sodium: 142 mmol/L (ref 135–145)
Sodium: 143 mmol/L (ref 135–145)
TCO2: 12 mmol/L — ABNORMAL LOW (ref 22–32)
TCO2: 22 mmol/L (ref 22–32)
TCO2: 19 mmol/L — ABNORMAL LOW (ref 22–32)
pCO2 arterial: 44.3 mmHg (ref 32.0–48.0)
pCO2 arterial: 34.7 mmHg (ref 32.0–48.0)
pCO2 arterial: 28.6 mmHg — ABNORMAL LOW (ref 32.0–48.0)
pH, Arterial: 6.968 — CL (ref 7.350–7.450)
pH, Arterial: 7.399 (ref 7.350–7.450)
pH, Arterial: 7.409 (ref 7.350–7.450)
pO2, Arterial: 86 mmHg (ref 83.0–108.0)
pO2, Arterial: 88 mmHg (ref 83.0–108.0)
pO2, Arterial: 84 mmHg (ref 83.0–108.0)

## 2020-04-04 LAB — CBC
HCT: 27.2 % — ABNORMAL LOW (ref 36.0–46.0)
HCT: 25.2 % — ABNORMAL LOW (ref 36.0–46.0)
Hemoglobin: 8.9 g/dL — ABNORMAL LOW (ref 12.0–15.0)
Hemoglobin: 8.6 g/dL — ABNORMAL LOW (ref 12.0–15.0)
MCH: 28 pg (ref 26.0–34.0)
MCH: 29.1 pg (ref 26.0–34.0)
MCHC: 32.7 g/dL (ref 30.0–36.0)
MCHC: 34.1 g/dL (ref 30.0–36.0)
MCV: 85.5 fL (ref 80.0–100.0)
MCV: 85.1 fL (ref 80.0–100.0)
Platelets: 123 10*3/uL — ABNORMAL LOW (ref 150–400)
RBC: 3.18 MIL/uL — ABNORMAL LOW (ref 3.87–5.11)
RBC: 2.96 MIL/uL — ABNORMAL LOW (ref 3.87–5.11)
RDW: 15.8 % — ABNORMAL HIGH (ref 11.5–15.5)
RDW: 16.7 % — ABNORMAL HIGH (ref 11.5–15.5)
WBC: 10.6 10*3/uL — ABNORMAL HIGH (ref 4.0–10.5)
WBC: 14 10*3/uL — ABNORMAL HIGH (ref 4.0–10.5)
nRBC: 0.7 % — ABNORMAL HIGH (ref 0.0–0.2)
nRBC: 0.3 % — ABNORMAL HIGH (ref 0.0–0.2)

## 2020-04-04 LAB — BPAM PLATELET PHERESIS
Blood Product Expiration Date: 202201032359
ISSUE DATE / TIME: 202201031438
Unit Type and Rh: 6200

## 2020-04-04 LAB — PREPARE FRESH FROZEN PLASMA: Unit division: 0

## 2020-04-04 LAB — PREPARE PLATELET PHERESIS: Unit division: 0

## 2020-04-04 LAB — BPAM FFP
Blood Product Expiration Date: 202201082359
ISSUE DATE / TIME: 202201031439
Unit Type and Rh: 6200

## 2020-04-04 LAB — BASIC METABOLIC PANEL
Anion gap: 8 (ref 5–15)
Anion gap: 8 (ref 5–15)
BUN: 13 mg/dL (ref 8–23)
BUN: 13 mg/dL (ref 8–23)
CO2: 20 mmol/L — ABNORMAL LOW (ref 22–32)
CO2: 22 mmol/L (ref 22–32)
Calcium: 7.2 mg/dL — ABNORMAL LOW (ref 8.9–10.3)
Calcium: 7.8 mg/dL — ABNORMAL LOW (ref 8.9–10.3)
Chloride: 112 mmol/L — ABNORMAL HIGH (ref 98–111)
Chloride: 109 mmol/L (ref 98–111)
Creatinine, Ser: 0.69 mg/dL (ref 0.44–1.00)
Creatinine, Ser: 0.95 mg/dL (ref 0.44–1.00)
GFR, Estimated: >60 mL/min (ref >60)
GFR, Estimated: >60 mL/min (ref >60)
Glucose, Bld: 121 mg/dL — ABNORMAL HIGH (ref 70–99)
Glucose, Bld: 145 mg/dL — ABNORMAL HIGH (ref 70–99)
Potassium: 3.7 mmol/L (ref 3.5–5.1)
Potassium: 3.9 mmol/L (ref 3.5–5.1)
Sodium: 140 mmol/L (ref 135–145)
Sodium: 139 mmol/L (ref 135–145)

## 2020-04-04 LAB — GLUCOSE, CAPILLARY
Glucose-Capillary: 106 mg/dL — ABNORMAL HIGH (ref 70–99)
Glucose-Capillary: 107 mg/dL — ABNORMAL HIGH (ref 70–99)
Glucose-Capillary: 109 mg/dL — ABNORMAL HIGH (ref 70–99)
Glucose-Capillary: 118 mg/dL — ABNORMAL HIGH (ref 70–99)
Glucose-Capillary: 122 mg/dL — ABNORMAL HIGH (ref 70–99)
Glucose-Capillary: 124 mg/dL — ABNORMAL HIGH (ref 70–99)
Glucose-Capillary: 128 mg/dL — ABNORMAL HIGH (ref 70–99)
Glucose-Capillary: 129 mg/dL — ABNORMAL HIGH (ref 70–99)
Glucose-Capillary: 133 mg/dL — ABNORMAL HIGH (ref 70–99)
Glucose-Capillary: 139 mg/dL — ABNORMAL HIGH (ref 70–99)
Glucose-Capillary: 140 mg/dL — ABNORMAL HIGH (ref 70–99)
Glucose-Capillary: 140 mg/dL — ABNORMAL HIGH (ref 70–99)
Glucose-Capillary: 142 mg/dL — ABNORMAL HIGH (ref 70–99)

## 2020-04-04 LAB — SURGICAL PATHOLOGY

## 2020-04-04 MED ORDER — POTASSIUM CHLORIDE 10 MEQ/50ML IV SOLN
10.0000 meq | INTRAVENOUS | Status: AC
  Administered 2020-04-04 (×3): 10 meq via INTRAVENOUS
  Filled 2020-04-04 (×2): qty 50

## 2020-04-04 MED ORDER — FUROSEMIDE 10 MG/ML IJ SOLN
40.0000 mg | Freq: Once | INTRAMUSCULAR | Status: AC
  Administered 2020-04-04: 40 mg via INTRAVENOUS
  Filled 2020-04-04: qty 4

## 2020-04-04 MED ORDER — INSULIN ASPART 100 UNIT/ML ~~LOC~~ SOLN
0.0000 [IU] | SUBCUTANEOUS | Status: DC
  Administered 2020-04-04 (×2): 2 [IU] via SUBCUTANEOUS

## 2020-04-04 MED ORDER — ENOXAPARIN SODIUM 40 MG/0.4ML ~~LOC~~ SOLN
40.0000 mg | Freq: Every day | SUBCUTANEOUS | Status: DC
  Administered 2020-04-04 – 2020-04-10 (×7): 40 mg via SUBCUTANEOUS
  Filled 2020-04-04 (×7): qty 0.4

## 2020-04-04 MED ORDER — INSULIN DETEMIR 100 UNIT/ML ~~LOC~~ SOLN
25.0000 [IU] | Freq: Two times a day (BID) | SUBCUTANEOUS | Status: DC
  Administered 2020-04-04 (×2): 25 [IU] via SUBCUTANEOUS
  Filled 2020-04-04 (×4): qty 0.25

## 2020-04-04 MED FILL — Lidocaine HCl Local Preservative Free (PF) Inj 2%: INTRAMUSCULAR | Qty: 15 | Status: AC

## 2020-04-04 MED FILL — Heparin Sodium (Porcine) Inj 1000 Unit/ML: INTRAMUSCULAR | Qty: 30 | Status: AC

## 2020-04-04 MED FILL — Potassium Chloride Inj 2 mEq/ML: INTRAVENOUS | Qty: 40 | Status: AC

## 2020-04-04 NOTE — Plan of Care (Signed)

## 2020-04-04 NOTE — Progress Notes (Signed)
      [address]       [place] [address]             [phone]       POD # 1 AVR, CABG  Resting comfortably Resistant to mobilization  BP (!) 98/59   Pulse 81   Temp 99.14 F (37.3 C)   Resp (!) 26   Ht 5' (1.524 m)   Wt 109.3 kg   LMP  (LMP Unknown)   SpO2 96%   BMI 47.06 kg/m   4L [place]  Intake/Output Summary (Last 24 hours) at 04/04/2020 1753 Last data filed at 04/04/2020 1600 Gross per 24 hour  Intake 3481.67 ml  Output 2290 ml  Net 1191.67 ml   K= 3.9, creatinine 0.95 Hct= 25  Stable POD # 1  Geraldean Walen C. Dorris Fetch, MD [facility] [phone]

## 2020-04-04 NOTE — Plan of Care (Signed)
  Problem: Education: Goal: Knowledge of General Education information will improve Description: Including pain rating scale, medication(s)/side effects and non-pharmacologic comfort measures Outcome: Progressing   Problem: Health Behavior/Discharge Planning: Goal: Ability to manage health-related needs will improve Outcome: Progressing   Problem: Clinical Measurements: Goal: Ability to maintain clinical measurements within normal limits will improve Outcome: Progressing Goal: Will remain free from infection Outcome: Progressing Goal: Diagnostic test results will improve Outcome: Progressing Goal: Respiratory complications will improve Outcome: Progressing Goal: Cardiovascular complication will be avoided Outcome: Progressing   Problem: Activity: Goal: Risk for activity intolerance will decrease Outcome: Progressing   Problem: Nutrition: Goal: Adequate nutrition will be maintained Outcome: Progressing   Problem: Coping: Goal: Level of anxiety will decrease Outcome: Progressing   Problem: Elimination: Goal: Will not experience complications related to bowel motility Outcome: Progressing Goal: Will not experience complications related to urinary retention Outcome: Progressing   Problem: Skin Integrity: Goal: Risk for impaired skin integrity will decrease Outcome: Progressing   Problem: Education: Goal: Ability to demonstrate management of disease process will improve Outcome: Progressing Goal: Ability to verbalize understanding of medication therapies will improve Outcome: Progressing Goal: Individualized Educational Video(s) Outcome: Progressing   Problem: Activity: Goal: Capacity to carry out activities will improve Outcome: Progressing   Problem: Cardiac: Goal: Ability to achieve and maintain adequate cardiopulmonary perfusion will improve Outcome: Progressing   Problem: Education: Goal: Will demonstrate proper wound care and an understanding of methods to  prevent future damage Outcome: Progressing Goal: Knowledge of disease or condition will improve Outcome: Progressing Goal: Knowledge of the prescribed therapeutic regimen will improve Outcome: Progressing Goal: Individualized Educational Video(s) Outcome: Progressing   Problem: Activity: Goal: Risk for activity intolerance will decrease Outcome: Progressing   Problem: Cardiac: Goal: Will achieve and/or maintain hemodynamic stability Outcome: Progressing   Problem: Clinical Measurements: Goal: Postoperative complications will be avoided or minimized Outcome: Progressing   Problem: Respiratory: Goal: Respiratory status will improve Outcome: Progressing   Problem: Skin Integrity: Goal: Wound healing without signs and symptoms of infection Outcome: Progressing Goal: Risk for impaired skin integrity will decrease Outcome: Progressing   Problem: Urinary Elimination: Goal: Ability to achieve and maintain adequate renal perfusion and functioning will improve Outcome: Progressing

## 2020-04-04 NOTE — Progress Notes (Signed)
Patient met all parameters to be extubated per SICU Rapid wean protocol, but did not have a cuff leak. Informed MD who asked for anesthesia to be present at extubation. Anesthesia came & did the extubation.

## 2020-04-04 NOTE — Progress Notes (Signed)
At 2200 attempted wean for rapid extubation. Per report, pt. was a difficult airway. Pt. lethargic but able to arouse easily. ABG drawn during wean. PH 7.38 Co2 33.5, O2 113, BE -5, HCO3 20 with VSS.No audible cuff leak heard by RT or bedside nurses. On call provider made aware and care order instructions given to delay extubation until morning.

## 2020-04-04 NOTE — Progress Notes (Signed)
Anesthesiology note:  I was asked to assist with extubation in Heidi Morse a 65 year old female who is 1 day status post CABG by Dr. Kipp Brood.  At the time of intubation prior to surgery, she was noted to be a moderately difficult intubation requiring glide scope and 2 attempts with the MAC blade.    She is now awake and alert tolerating CPAP and minimal pressure support without labored respirations tachypnea or tachycardia. No cuff leak could be heard. Her blood gases pH 739 PCO2 34 PO2 88 on 40% oxygen on CPAP.  4 cc of 4% lidocaine was then administered into the tube.  Following 2 minutes, a Cook airway exchange catheter was inserted through the endotracheal tube and the patient was extubated.  Following extubation, she  was breathing adequately without stridor or dyspnea with the airway catheter in place.  The cook catheter was then removed.  Her oxygen saturation were 98 to 99% on 4 L nasal cannula following removal of the airway catheter.  Roberts Gaudy

## 2020-04-04 NOTE — Progress Notes (Signed)
1 Day Post-Op Procedure(s) (LRB): CORONARY ARTERY BYPASS GRAFTING X 2 ON CARDIOPULMONARY BYPASS. AORTIC VALVE REPLACEMENT USING 21 MM INSPIRIS RESILIA AORTIC VALVE, STERNAL PLATING (N/A) ENDOVEIN HARVEST OF GREATER SAPHENOUS VEIN (Right) TRANSESOPHAGEAL ECHOCARDIOGRAM (TEE) (N/A) AORTIC ROOT ENLARGEMENT USING HEMASHIELD PLATINUM WOVEN DOUBLE VELOUR VASCULAR GRAFT 28 MM X 30 CM (N/A) Subjective: Intubated, alert, c/o incisional pain  Objective: Vital signs in last 24 hours: Temp:  [97.34 F (36.3 C)-100.22 F (37.9 C)] 100.22 F (37.9 C) (01/04 0700) Pulse Rate:  [73-85] 76 (01/04 0700) Cardiac Rhythm: Normal sinus rhythm (01/04 0400) Resp:  [0-26] 0 (01/04 0700) BP: (87-126)/(45-81) 94/61 (01/04 0700) SpO2:  [98 %-100 %] 100 % (01/04 0700) FiO2 (%):  [40 %-50 %] 40 % (01/04 0500) Weight:  [109.3 kg] 109.3 kg (01/04 0457)  Hemodynamic parameters for last 24 hours: CVP:  [10 mmHg-13 mmHg] 13 mmHg  Intake/Output from previous day: 01/03 0701 - 01/04 0700 In: 7405.4 [I.V.:4994.9; Blood:551.7; IV Piggyback:1858.8] Out: 5820 [Urine:3800; Blood:1500; Chest Tube:520] Intake/Output this shift: Total I/O In: 82.3 [I.V.:53.4; IV Piggyback:28.9] Out: 92 [Urine:30; Chest Tube:62]  General appearance: alert, cooperative and no distress Neurologic: nonfoca Heart: regular rate and rhythm and faint rub Lungs: clear to auscultation bilaterally Abdomen: normal findings: soft, non-tender  Lab Results: Recent Labs    04/03/20 2128 04/03/20 2156 04/04/20 0346 04/04/20 0513  WBC 15.7*  --  10.6*  --   HGB 6.4*   < > 8.9* 8.8*  HCT 19.2*   < > 27.2* 26.0*  PLT 148*  --  123*  --    < > = values in this interval not displayed.   BMET:  Recent Labs    04/03/20 2128 04/03/20 2156 04/04/20 0346 04/04/20 0513  NA 140   < > 140 142  K 4.0   < > 3.7 3.9  CL 111  --  112*  --   CO2 20*  --  20*  --   GLUCOSE 173*  --  121*  --   BUN 13  --  13  --   CREATININE 0.70  --  0.69  --    CALCIUM 7.3*  --  7.2*  --    < > = values in this interval not displayed.    PT/INR:  Recent Labs    04/03/20 1643  LABPROT 19.1*  INR 1.7*   ABG    Component Value Date/Time   PHART 7.399 04/04/2020 0513   HCO3 21.3 04/04/2020 0513   TCO2 22 04/04/2020 0513   ACIDBASEDEF 3.0 (H) 04/04/2020 0513   O2SAT 97.0 04/04/2020 0513   CBG (last 3)  Recent Labs    04/04/20 0552 04/04/20 0649 04/04/20 0757  GLUCAP 107* 140* 139*    Assessment/Plan: S/P Procedure(s) (LRB): CORONARY ARTERY BYPASS GRAFTING X 2 ON CARDIOPULMONARY BYPASS. AORTIC VALVE REPLACEMENT USING 21 MM INSPIRIS RESILIA AORTIC VALVE, STERNAL PLATING (N/A) ENDOVEIN HARVEST OF GREATER SAPHENOUS VEIN (Right) TRANSESOPHAGEAL ECHOCARDIOGRAM (TEE) (N/A) AORTIC ROOT ENLARGEMENT USING HEMASHIELD PLATINUM WOVEN DOUBLE VELOUR VASCULAR GRAFT 28 MM X 30 CM (N/A) POD # 1 Doing well  CV- in SR, CI 2.8 on norepi  Wean norepi RESP- Intubated overnight due to airway concerns.   Wean to extubate, may need anesthesia stand by RENAL- creatinine OK, K being supplemented  Diurese ENDO- CBG well controlled  Transition off insulin drip Anemia secondary to ABL- improved post transfusion DC chest tubes post extubation SCD + enoxaparin for DVT prophylaxis OOB after extubation   LOS: 7 days  Heidi Morse 04/04/2020

## 2020-04-04 NOTE — Addendum Note (Signed)
Addendum  created 04/04/20 1058 by Kipp Brood, MD   Clinical Note Signed

## 2020-04-04 NOTE — Progress Notes (Signed)
Visit made to patients room to discuss wearing CPAP/BIPAP tonight.  Patient states that she don't wear it every night at home and don't need it tonight.  I advised patient it would be beneficial to wear it tonight due to her condition and tonight being the first night extubated.  Patient still said no.  I will check on her through the night and offer if patient has desating etc.

## 2020-04-04 NOTE — Discharge Summary (Addendum)
Physician Discharge Summary  Patient ID: Heidi Morse MRN: [id number] DOB/AGE: 1955-06-21 65 y.o.  Admit date: 03/28/2020 Discharge date: 04/11/2020  Admission Diagnoses:  Patient Active Problem List   Diagnosis Date Noted  . Coronary artery disease 04/03/2020  . Unstable angina (HCC) 03/29/2020  . Aortic stenosis 03/29/2020  . Symptomatic anemia 03/28/2020   Discharge Diagnoses:   Patient Active Problem List   Diagnosis Date Noted  . S/P CABG x 2 04/03/2020  . S/P aortic valve replacement with bioprosthetic valve 04/03/2020  . Coronary artery disease 04/03/2020  . Unstable angina (HCC) 03/29/2020  . Aortic stenosis 03/29/2020  . Symptomatic anemia 03/28/2020   Discharged Condition: good  History of Present Illness:  Heidi Morse is a 65 yo morbidly obese female with known history of CAD S/P MI, CHF, and previous stent to LAD 05/2019.  She also has history of HTN, insulin dependent DM, COPD, OSA, H/O small bowel AVM, and diverticulosis.  She presented to the ED on 03/28/2020 with complaints of chest pain.  The patient had been experiencing chest pain transiently and primarily with exertion.  However the pain occurred at rest on day of presentation.  She achieved relief with use of NTG.  Workup in the ED showed a Hgb level of 7.6.  She was transfused a unit of cells and GI consultation was requested.   Initial EKG and troponin levels were not consisted with ischemia, but due to her cardiac history cardiology consult was obtained.  She was admitted for further care.  Hospital Course:  As dictated by Gershon Crane PA-C: The patient was evaluated by GI who recommended monitoring of CBC and transfuse as needed.  They did not feel intervention was required on this current admission.  Cardiology evaluated the patient and felt cardiac catheterization would be indicated.  This was performed on 03/29/2020 and showed severe 2 vessel CAD.  It was felt coronary bypass grafting would be indicated.   Her Echocardiogram from March of 2021 was also reviewed and showed moderate AS and moderate MR.  Cardiothoracic surgery consultation was requested. The patient was evaluated by Dr. Cliffton Asters who felt coronary bypass grafting would be indicated.  He also felt the patient would require Aortic Valve replacement.  The risks and benefits of the procedure were explained to the patient and she was agreeable to proceed.  Heidi Morse was taken to the operating room on 04/04/2019.  She underwent CABG x 2 utilizing LIMA to LAD, SVG to OM 1, Aortic Valve Replacement with a 21 mm Inspiris Resilia heart valve with enlargement of her aortic root and endoscopic harvest of greater saphenous vein from her right thigh.  She tolerated the procedure and was taken to the SICU in stable condition.  During her stay in the SICU the patient was weaned and extubated on POD #1.  She was weaned off Levophed as hemodynamics allowed.  She was started on diuretics for volume overload state.  She required transfusion for post operative blood loss anemia, worsened by her history of chronic anemia.  Her chest tubes and arterial lines were removed without difficulty.  She was maintaining NSR and felt stable for transfer to the progressive care unit on 04/08/2020.  On 04/09/2020 she went into atrial fibrillation with rapid ventricular response.  Due to her iodine allergy, she was unable to be started on amiodarone.  She has been started on a digoxin load and beta-blocker is also been uptitrated.  Addendum 04/11/2020: Patient was ambulating on room air with good oxygenation.  She developed mild leukocytosis on 01/10 as WBC was 13,800. UA was done and showed large leukocytes, >50 WBC, few bacteria. As discussed with Dr. Kipp Brood, patient to be prescribed Cipro. Unfortunately, patient's pharmacy did not have Cipro so Bactrim was prescribed. Preveena sternal wound VAC was removed on 01/11 and sternal wound is clean, dry, and healing without sign of  infection. RLE wounds are mostly clean and dry and no sign of infection. WBC was decreased to 10,800 on 01/11. She continued to remain afebrile. She did have a productive cough (sputum clear) and Mucinex did help her expectorate.  She has been tolerating a diet. Regarding her rhythm, she has been maintaining sinus rhythm.Her SBP and HR increased later the am of 01/11. As a result, Lopressor was titrated to 37.5 mg bid.  Nylon sutures and chest tube sutures will be removed at office appointment on 01/13. Home OT and PT have been arranged. As discussed with Dr. Kipp Brood, she is surgically stable for discharge today.  Consults: GI  Significant Diagnostic Studies: angiography:    Prox RCA to Mid RCA lesion is 40% stenosed.  Ost Cx lesion is 99% stenosed.  Dist LM to Ost LAD lesion is 60% stenosed.   1. Patent left main stent with distal moderate restenosis. The stent extends into the LAD. There is haziness in the ostium of the LAD suggesting at least moderate restenosis of the stent. Another stent extends from the left main into the Circumflex. Severe ostial Circumflex restenosis. Good targets for bypass in the obtuse marginal branches and mid LAD   Echocardiogram:   1. Left ventricular ejection fraction, by estimation, is 45 to 50%. The  left ventricle has mildly decreased function. The left ventricle  demonstrates global hypokinesis. There is mild left ventricular  hypertrophy. Left ventricular diastolic parameters  are consistent with Grade II diastolic dysfunction (pseudonormalization).  Elevated left atrial pressure.  2. Right ventricular systolic function is normal. The right ventricular  size is normal. There is normal pulmonary artery systolic pressure. The  estimated right ventricular systolic pressure is 38.7 mmHg.  3. The mitral valve is abnormal. Moderate mitral valve regurgitation.  Moderate mitral annular calcification.  4. The aortic valve is tricuspid. There is moderate  thickening of the  aortic valve. Aortic valve regurgitation is mild to moderate. Moderate  aortic valve stenosis. Vmax 3.4 m/s, MG 24 mmHg, AVA 0.9 cm^2, DI 0.34  5. The inferior vena cava is normal in size with greater than 50%  respiratory variability, suggesting right atrial pressure of 3 mmHg.   Treatments: surgery:   Procedure: Aortic valve replacement with a 29mm Inspiris valve Annular enlargement of the aortic root Coronary artery bypass grafting X 2.  LIMA LAD, RSVG OM1   Endoscopic harvest greater saphenous vein from right leg Intra-operative Transesophageal Echocardiogram  Discharge Exam: Blood pressure (!) 143/68, pulse 99, temperature 98.8 F (37.1 C), resp. rate 20, height 5' (1.524 m), weight 102.9 kg, SpO2 92 %.  Cardiovascular: RRR Pulmonary: Slightly diminished bibasilar breath sounds Abdomen: Soft, non tender, bowel sounds present. Extremities: Mild bilateral lower extremity edema. Mild ecchymosis right thigh Wounds: Clean and dry.  No erythema or signs of infection. Sternal wound is clean and dry.  Disposition: Discharge disposition: 01-Home or Self Care       Discharge Instructions    AMB Referral to Community Care Coordinaton   Complete by: As directed    Managed [facility] team referral, patient has [facility] needs assistance with follow up, is  to have HHPT/RN follow up per inpatient TOC RNCM.  Charlesetta Shanks, RN BSN CCM [facility]  [phone] business mobile phone Toll free office [phone]  Fax number: [phone] [email or web address]   Reason for Referral: Care Coordination (Managed Medicaid)   MM Services needed: Nurse Case Manager   Diagnoses of: High-risk Medicaid   Expected date of contact: Urgent - 7 Days     Allergies as of 04/11/2020      Reactions   Peanut-containing Drug Products Hives, Swelling   Statins Other (See Comments)   myalgia    Iodine Itching, Rash   unk reaction per pt      Medication List    STOP taking these medications   amLODipine 2.5 MG tablet Commonly known as: NORVASC   clopidogrel 75 MG tablet Commonly known as: PLAVIX   HYDROcodone-acetaminophen 10-325 MG tablet Commonly known as: NORCO   lisinopril 2.5 MG tablet Commonly known as: ZESTRIL   metoprolol succinate 25 MG 24 hr tablet Commonly known as: TOPROL-XL   nitroGLYCERIN 0.4 MG SL tablet Commonly known as: NITROSTAT     TAKE these medications   acetaminophen 325 MG tablet Commonly known as: TYLENOL Take 2 tablets (650 mg total) by mouth every 6 (six) hours as needed for mild pain.   albuterol 108 (90 Base) MCG/ACT inhaler Commonly known as: VENTOLIN HFA Inhale 2 puffs into the lungs every 6 (six) hours as needed for wheezing.   aspirin 325 MG EC tablet Take 1 tablet (325 mg total) by mouth daily. What changed:   medication strength  how much to take   atorvastatin 80 MG tablet Commonly known as: LIPITOR Take 80 mg by mouth daily.   Cholecalciferol 25 MCG (1000 UT) tablet Take 2,000 Units by mouth daily.   digoxin 0.125 MG tablet Commonly known as: LANOXIN Take 1 tablet (0.125 mg total) by mouth daily.   famotidine 20 MG tablet Commonly known as: PEPCID Take 20 mg by mouth daily.   ferrous sulfate 325 (65 FE) MG tablet Take 1 tablet by mouth 2 (two) times daily.   Fluticasone-Salmeterol 250-50 MCG/DOSE Aepb Commonly known as: ADVAIR Inhale 1 puff into the lungs in the morning and at bedtime.   furosemide 40 MG tablet Commonly known as: LASIX Take 1 tablet (40 mg total) by mouth daily. For 5 days then stop. What changed: additional instructions   gabapentin 300 MG capsule Commonly known as: NEURONTIN Take 300 mg by mouth in the morning, at noon, in the evening, and at bedtime.   guaiFENesin 600 MG 12 hr tablet Commonly known as: MUCINEX Take 1 tablet (600 mg total) by mouth 2 (two) times daily as needed  for cough or to loosen phlegm.   insulin glargine 100 UNIT/ML injection Commonly known as: LANTUS Inject 24 Units into the skin 2 (two) times daily as needed (for elevated blood over 150).   metoprolol tartrate 25 MG tablet Commonly known as: LOPRESSOR Take 1.5 tablets (37.5 mg total) by mouth 2 (two) times daily.   montelukast 10 MG tablet Commonly known as: SINGULAIR Take 10 mg by mouth daily.   oxyCODONE 5 MG immediate release tablet Commonly known as: Oxy IR/ROXICODONE Take 1 tablet (5 mg total) by mouth every 4 (four) hours as needed for severe pain.   potassium chloride SA 20 MEQ tablet Commonly known as: KLOR-CON Take 1 tablet (20 mEq total) by mouth once for 1 dose. For 5 days then stop.   sulfamethoxazole-trimethoprim 400-80 MG tablet  Commonly known as: BACTRIM Take 1 tablet by mouth every 12 (twelve) hours.   valACYclovir 500 MG tablet Commonly known as: VALTREX Take 500 mg by mouth as needed (for flare ups).      The patient has been discharged on:   1.Beta Blocker:  Yes [ x  ]                              No   [   ]                              If No, reason:  2.Ace Inhibitor/ARB: Yes [   ]                                     No  [  x  ]                                     If No, reason: On Midodrine prior to discharge. As BP improves, will likely restart Lisinopril after discharge  3.Statin:   Yes [  x ]                  No  [   ]                  If No, reason:  4.Ecasa:  Yes  [x   ]                  No   [   ]                  If No, reason:   Follow-up Information    Teressa Senter, PA-C Follow up on 05/02/2020.   Specialty: Gastroenterology Why: 10 PM follow up w GI PA they may call you with soomer appointment if they have a cancellation.   Contact information: [address] [place] [address] [phone]        Corliss Skains, MD Follow up on 04/13/2020.   Specialty: Cardiothoracic Surgery Why:  Appointment is at 3:15 pm Contact information: [address] [place] [address] [phone]        Beatriz Stallion., PA-C Follow up on 04/28/2020.   Specialties: Physician Assistant, Cardiology Why: Appointment is at [employer] information: [address] [place] [address] [place] [address] [phone]        [facility] Follow up.   Specialty: Home Health Services Why: HHPT arranged- they will contact you post discharge to set up start of care visit              Signed: Ardelle Balls  PA-C 04/11/2020, 9:09 AM

## 2020-04-05 ENCOUNTER — Inpatient Hospital Stay (HOSPITAL_COMMUNITY): Payer: Medicaid Other

## 2020-04-05 LAB — CBC
HCT: 23.8 % — ABNORMAL LOW (ref 36.0–46.0)
Hemoglobin: 7.9 g/dL — ABNORMAL LOW (ref 12.0–15.0)
MCH: 28.9 pg (ref 26.0–34.0)
MCHC: 33.2 g/dL (ref 30.0–36.0)
MCV: 87.2 fL (ref 80.0–100.0)
Platelets: 105 10*3/uL — ABNORMAL LOW (ref 150–400)
RBC: 2.73 MIL/uL — ABNORMAL LOW (ref 3.87–5.11)
RDW: 17.3 % — ABNORMAL HIGH (ref 11.5–15.5)
WBC: 14 10*3/uL — ABNORMAL HIGH (ref 4.0–10.5)
nRBC: 0.2 % (ref 0.0–0.2)

## 2020-04-05 LAB — BASIC METABOLIC PANEL
Anion gap: 8 (ref 5–15)
BUN: 14 mg/dL (ref 8–23)
CO2: 22 mmol/L (ref 22–32)
Calcium: 7.7 mg/dL — ABNORMAL LOW (ref 8.9–10.3)
Chloride: 110 mmol/L (ref 98–111)
Creatinine, Ser: 0.83 mg/dL (ref 0.44–1.00)
GFR, Estimated: >60 mL/min (ref >60)
Glucose, Bld: 142 mg/dL — ABNORMAL HIGH (ref 70–99)
Potassium: 3.7 mmol/L (ref 3.5–5.1)
Sodium: 140 mmol/L (ref 135–145)

## 2020-04-05 LAB — GLUCOSE, CAPILLARY
Glucose-Capillary: 114 mg/dL — ABNORMAL HIGH (ref 70–99)
Glucose-Capillary: 134 mg/dL — ABNORMAL HIGH (ref 70–99)
Glucose-Capillary: 219 mg/dL — ABNORMAL HIGH (ref 70–99)
Glucose-Capillary: 142 mg/dL — ABNORMAL HIGH (ref 70–99)
Glucose-Capillary: 125 mg/dL — ABNORMAL HIGH (ref 70–99)

## 2020-04-05 MED ORDER — OXYCODONE HCL 5 MG PO TABS
5.0000 mg | ORAL_TABLET | ORAL | Status: DC | PRN
  Administered 2020-04-06 – 2020-04-07 (×2): 5 mg via ORAL
  Administered 2020-04-08: 10 mg via ORAL
  Administered 2020-04-08: 5 mg via ORAL
  Administered 2020-04-08: 10 mg via ORAL
  Administered 2020-04-09: 5 mg via ORAL
  Administered 2020-04-09 – 2020-04-10 (×2): 10 mg via ORAL
  Administered 2020-04-10 (×2): 5 mg via ORAL
  Administered 2020-04-11: 10 mg via ORAL
  Filled 2020-04-05 (×2): qty 2
  Filled 2020-04-05 (×2): qty 1
  Filled 2020-04-05: qty 2
  Filled 2020-04-05: qty 1
  Filled 2020-04-05: qty 2
  Filled 2020-04-05 (×3): qty 1
  Filled 2020-04-05: qty 2

## 2020-04-05 MED ORDER — INSULIN ASPART 100 UNIT/ML ~~LOC~~ SOLN
0.0000 [IU] | Freq: Three times a day (TID) | SUBCUTANEOUS | Status: DC
  Administered 2020-04-05: 8 [IU] via SUBCUTANEOUS
  Administered 2020-04-05 – 2020-04-10 (×7): 2 [IU] via SUBCUTANEOUS

## 2020-04-05 MED ORDER — TRAMADOL HCL 50 MG PO TABS
50.0000 mg | ORAL_TABLET | Freq: Four times a day (QID) | ORAL | Status: DC | PRN
  Administered 2020-04-05 – 2020-04-08 (×9): 50 mg via ORAL
  Filled 2020-04-05 (×9): qty 1

## 2020-04-05 MED ORDER — METOPROLOL TARTRATE 12.5 MG HALF TABLET
12.5000 mg | ORAL_TABLET | Freq: Two times a day (BID) | ORAL | Status: DC
  Administered 2020-04-07 – 2020-04-08 (×4): 12.5 mg via ORAL
  Filled 2020-04-05 (×6): qty 1

## 2020-04-05 MED ORDER — METOLAZONE 2.5 MG PO TABS
2.5000 mg | ORAL_TABLET | Freq: Once | ORAL | Status: DC
  Filled 2020-04-05: qty 1

## 2020-04-05 MED ORDER — FUROSEMIDE 10 MG/ML IJ SOLN
40.0000 mg | Freq: Once | INTRAMUSCULAR | Status: AC
  Administered 2020-04-05: 40 mg via INTRAVENOUS
  Filled 2020-04-05: qty 4

## 2020-04-05 MED ORDER — SODIUM CHLORIDE 0.9 % IV SOLN
250.0000 mL | INTRAVENOUS | Status: DC | PRN
Start: 2020-04-05 — End: 2020-04-11

## 2020-04-05 MED ORDER — METOLAZONE 2.5 MG PO TABS
2.5000 mg | ORAL_TABLET | Freq: Once | ORAL | Status: AC
  Administered 2020-04-05: 2.5 mg via ORAL
  Filled 2020-04-05: qty 1

## 2020-04-05 MED ORDER — METOPROLOL TARTRATE 12.5 MG HALF TABLET: 12.5000 mg | ORAL_TABLET | Freq: Two times a day (BID) | ORAL | Status: DC

## 2020-04-05 MED ORDER — ONDANSETRON HCL 4 MG PO TABS: 4.0000 mg | ORAL_TABLET | Freq: Four times a day (QID) | ORAL | Status: DC | PRN

## 2020-04-05 MED ORDER — SODIUM CHLORIDE 0.9% FLUSH
3.0000 mL | Freq: Two times a day (BID) | INTRAVENOUS | Status: DC
  Administered 2020-04-05 – 2020-04-11 (×10): 3 mL via INTRAVENOUS

## 2020-04-05 MED ORDER — ~~LOC~~ CARDIAC SURGERY, PATIENT & FAMILY EDUCATION: Freq: Once | Status: AC

## 2020-04-05 MED ORDER — PANTOPRAZOLE SODIUM 40 MG PO TBEC
40.0000 mg | DELAYED_RELEASE_TABLET | Freq: Every day | ORAL | Status: DC
  Administered 2020-04-06 – 2020-04-11 (×6): 40 mg via ORAL
  Filled 2020-04-05 (×6): qty 1

## 2020-04-05 MED ORDER — INSULIN DETEMIR 100 UNIT/ML ~~LOC~~ SOLN
20.0000 [IU] | Freq: Two times a day (BID) | SUBCUTANEOUS | Status: DC
  Administered 2020-04-05 (×2): 20 [IU] via SUBCUTANEOUS
  Filled 2020-04-05 (×4): qty 0.2

## 2020-04-05 MED ORDER — POTASSIUM CHLORIDE CRYS ER 20 MEQ PO TBCR
20.0000 meq | EXTENDED_RELEASE_TABLET | ORAL | Status: AC
  Administered 2020-04-05 (×3): 20 meq via ORAL
  Filled 2020-04-05 (×3): qty 1

## 2020-04-05 MED ORDER — DOCUSATE SODIUM 100 MG PO CAPS
200.0000 mg | ORAL_CAPSULE | Freq: Every day | ORAL | Status: DC
  Administered 2020-04-06 – 2020-04-10 (×5): 200 mg via ORAL
  Filled 2020-04-05 (×6): qty 2

## 2020-04-05 MED ORDER — SODIUM CHLORIDE 0.9% FLUSH: 3.0000 mL | INTRAVENOUS | Status: DC | PRN

## 2020-04-05 MED ORDER — CHLORHEXIDINE GLUCONATE CLOTH 2 % EX PADS
6.0000 | MEDICATED_PAD | Freq: Every day | CUTANEOUS | Status: DC
  Administered 2020-04-05 – 2020-04-10 (×6): 6 via TOPICAL

## 2020-04-05 MED ORDER — ASPIRIN EC 325 MG PO TBEC
325.0000 mg | DELAYED_RELEASE_TABLET | Freq: Every day | ORAL | Status: DC
  Administered 2020-04-06 – 2020-04-11 (×6): 325 mg via ORAL
  Filled 2020-04-05 (×6): qty 1

## 2020-04-05 MED ORDER — ACETAMINOPHEN 325 MG PO TABS: 650.0000 mg | ORAL_TABLET | Freq: Four times a day (QID) | ORAL | Status: DC | PRN

## 2020-04-05 MED ORDER — FUROSEMIDE 40 MG PO TABS
40.0000 mg | ORAL_TABLET | Freq: Every day | ORAL | Status: DC
  Filled 2020-04-05: qty 1

## 2020-04-05 MED ORDER — POTASSIUM CHLORIDE CRYS ER 20 MEQ PO TBCR
20.0000 meq | EXTENDED_RELEASE_TABLET | Freq: Two times a day (BID) | ORAL | Status: DC
  Administered 2020-04-06 – 2020-04-09 (×8): 20 meq via ORAL
  Filled 2020-04-05 (×8): qty 1

## 2020-04-05 MED ORDER — BISACODYL 10 MG RE SUPP: 10.0000 mg | Freq: Every day | RECTAL | Status: DC | PRN

## 2020-04-05 MED ORDER — ONDANSETRON HCL 4 MG/2ML IJ SOLN: 4.0000 mg | Freq: Four times a day (QID) | INTRAMUSCULAR | Status: DC | PRN

## 2020-04-05 MED ORDER — NOREPINEPHRINE 4 MG/250ML-% IV SOLN
0.0000 ug/min | INTRAVENOUS | Status: DC
  Administered 2020-04-05: 4 ug/min via INTRAVENOUS
  Filled 2020-04-05: qty 250

## 2020-04-05 MED ORDER — BISACODYL 5 MG PO TBEC: 10.0000 mg | DELAYED_RELEASE_TABLET | Freq: Every day | ORAL | Status: DC | PRN

## 2020-04-05 MED ORDER — MIDODRINE HCL 5 MG PO TABS
10.0000 mg | ORAL_TABLET | Freq: Three times a day (TID) | ORAL | Status: DC
  Administered 2020-04-05 – 2020-04-09 (×14): 10 mg via ORAL
  Filled 2020-04-05 (×14): qty 2

## 2020-04-05 MED FILL — Electrolyte-R (PH 7.4) Solution: INTRAVENOUS | Qty: 6000 | Status: AC

## 2020-04-05 MED FILL — Heparin Sodium (Porcine) Inj 1000 Unit/ML: INTRAMUSCULAR | Qty: 10 | Status: AC

## 2020-04-05 MED FILL — Sodium Bicarbonate IV Soln 8.4%: INTRAVENOUS | Qty: 50 | Status: AC

## 2020-04-05 MED FILL — Calcium Chloride Inj 10%: INTRAVENOUS | Qty: 10 | Status: AC

## 2020-04-05 MED FILL — Mannitol IV Soln 20%: INTRAVENOUS | Qty: 500 | Status: AC

## 2020-04-05 MED FILL — Sodium Chloride IV Soln 0.9%: INTRAVENOUS | Qty: 2000 | Status: AC

## 2020-04-05 NOTE — Progress Notes (Signed)
Patient refused CPAP for the night  

## 2020-04-05 NOTE — Progress Notes (Signed)
PT Cancellation Note  Patient Details Name: Heidi Morse MRN: [id number] DOB: 15-Sep-1955   Cancelled Treatment:    Reason Eval/Treat Not Completed: (P) Medical issues which prohibited therapy Pt with orthostatic hypotension and orders cancelled. RN to reorder therapy for tomorrow. PT will see tomorrow.   Devan Babino B. Beverely Risen PT, DPT Acute Rehabilitation Services Pager [phone] Office [phone]  Elon Alas Fleet 04/05/2020, 2:49 PM

## 2020-04-05 NOTE — Progress Notes (Signed)
PT Cancellation Note  Patient Details Name: Heidi Morse MRN: [id number] DOB: 1955/09/05   Cancelled Treatment:    Reason Eval/Treat Not Completed: (P) Patient declined, no reason specified RN report pt has just gotten back to bed. PT spoke with pt and will plan to come back at 3 pm for Evaluation.  Wyndell Cardiff B. Beverely Risen PT, DPT Acute Rehabilitation Services Pager [phone] Office [phone]   Elon Alas Fleet 04/05/2020, 12:46 PM

## 2020-04-05 NOTE — Progress Notes (Signed)
Patient ID: Heidi Morse, female   DOB: [birth year], 65 y.o.   MRN: [id number] TCTS Evening Rounds:  She was orthostatic this am to the 70-80 range and NE restarted as well as po midodrine. BP better this afternoon and now down to 2 mcg NE. Lopressor held today and has had some SVT. May end up in atrial fib.   Diuresed some today.  Ambulated twice but as above orthostatic.

## 2020-04-05 NOTE — Progress Notes (Signed)
2 Days Post-Op Procedure(s) (LRB): CORONARY ARTERY BYPASS GRAFTING X 2 ON CARDIOPULMONARY BYPASS. AORTIC VALVE REPLACEMENT USING 21 MM INSPIRIS RESILIA AORTIC VALVE, STERNAL PLATING (N/A) ENDOVEIN HARVEST OF GREATER SAPHENOUS VEIN (Right) TRANSESOPHAGEAL ECHOCARDIOGRAM (TEE) (N/A) AORTIC ROOT ENLARGEMENT USING HEMASHIELD PLATINUM WOVEN DOUBLE VELOUR VASCULAR GRAFT 28 MM X 30 CM (N/A) Subjective:  No specific complaints. Was up to bedside potty this am but has no ambulated in hall postop yet.  Objective: Vital signs in last 24 hours: Temp:  [98.1 F (36.7 C)-100.58 F (38.1 C)] 99.3 F (37.4 C) (01/05 0500) Pulse Rate:  [79-103] 96 (01/05 0700) Cardiac Rhythm: Normal sinus rhythm (01/04 2000) Resp:  [0-31] 16 (01/05 0700) BP: (79-123)/(45-86) 99/65 (01/05 0700) SpO2:  [92 %-100 %] 97 % (01/05 0700) FiO2 (%):  [40 %] 40 % (01/04 0931) Weight:  [107.9 kg] 107.9 kg (01/05 0430)  Hemodynamic parameters for last 24 hours:    Intake/Output from previous day: 01/04 0701 - 01/05 0700 In: 777 [I.V.:317.1; IV Piggyback:459.9] Out: 1685 [Urine:1505; Chest Tube:180] Intake/Output this shift: No intake/output data recorded.  General appearance: alert and cooperative Neurologic: intact Heart: regular rate and rhythm, S1, S2 normal, no murmur Lungs: diminished breath sounds bibasilar Extremities: edema moderate Wound: Prevena dressing in place  Lab Results: Recent Labs    04/04/20 1636 04/05/20 0424  WBC 14.0* 14.0*  HGB 8.6* 7.9*  HCT 25.2* 23.8*  PLT ACLMP 105*   BMET:  Recent Labs    04/04/20 1636 04/05/20 0424  NA 139 140  K 3.9 3.7  CL 109 110  CO2 22 22  GLUCOSE 145* 142*  BUN 13 14  CREATININE 0.95 0.83  CALCIUM 7.8* 7.7*    PT/INR:  Recent Labs    04/03/20 1643  LABPROT 19.1*  INR 1.7*   ABG    Component Value Date/Time   PHART 7.409 04/04/2020 0956   HCO3 17.9 (L) 04/04/2020 0956   TCO2 19 (L) 04/04/2020 0956   ACIDBASEDEF 6.0 (H) 04/04/2020 0956    O2SAT 96.0 04/04/2020 0956   CBG (last 3)  Recent Labs    04/04/20 2346 04/05/20 0421 04/05/20 0645  GLUCAP 109* 114* 134*   CLINICAL DATA:  Status post AVR.  Sore chest.  EXAM: PORTABLE CHEST 1 VIEW  COMPARISON:  04/04/2020.  FINDINGS: Interim removal of endotracheal tube, NG tube, bilateral chest tubes, and mediastinal drainage tube. Right IJ sheath in stable position. Prior cardiac valve replacement and CABG. Stable cardiomegaly. Low lung volumes with basilar atelectasis. Left base infiltrate cannot be excluded. Similar findings on prior exam. Small left pleural effusion cannot be excluded. No pneumothorax.  IMPRESSION: 1. Interim removal of endotracheal tube, NG tube, bilateral chest tubes, mediastinal drainage tube. Right IJ sheath in stable position. 2. Prior cardiac valve replacement and CABG. Stable cardiomegaly. 3. Low lung volumes with bibasilar atelectasis. Left base infiltrate cannot be excluded. Small left pleural effusion cannot be excluded. Similar findings noted on prior exam.   Electronically Signed   By: Maisie Fus  Register   On: 04/05/2020 06:33  Assessment/Plan: S/P Procedure(s) (LRB): CORONARY ARTERY BYPASS GRAFTING X 2 ON CARDIOPULMONARY BYPASS. AORTIC VALVE REPLACEMENT USING 21 MM INSPIRIS RESILIA AORTIC VALVE, STERNAL PLATING (N/A) ENDOVEIN HARVEST OF GREATER SAPHENOUS VEIN (Right) TRANSESOPHAGEAL ECHOCARDIOGRAM (TEE) (N/A) AORTIC ROOT ENLARGEMENT USING HEMASHIELD PLATINUM WOVEN DOUBLE VELOUR VASCULAR GRAFT 28 MM X 30 CM (N/A)  POD 2  Hemodynamically stable in sinus rhythm. Continue low dose Lopressor.  Volume excess: Wt is 14-15 lbs over preop. Diuresed 1L  yesterday and down 3 lbs. Continue diuresis and K+ replacement.  DM: glucose under good control on Levemir bid and SSI. Will decrease Levemir to 20 bid since control may be a little tight and may develop hypoglycemia.  COPD: continue bronchodilators. preop ABG ok. No PFT's  done.  IS, ambulate.  Can transfer to 4E later today if she ambulates in ICU.  DC sleeve and foley on transfer.   LOS: 8 days    Alleen Borne 04/05/2020

## 2020-04-05 NOTE — Progress Notes (Signed)
Pt is willing to wear BIPAP now.  I set her up on FFM size large with auto titrate setting since patient can't remember what her original home setting are.  4lpm  Of oxygen bled in.  Pt tolerating well at this time.

## 2020-04-06 LAB — CBC
HCT: 23.2 % — ABNORMAL LOW (ref 36.0–46.0)
Hemoglobin: 7.5 g/dL — ABNORMAL LOW (ref 12.0–15.0)
MCH: 29.2 pg (ref 26.0–34.0)
MCHC: 32.3 g/dL (ref 30.0–36.0)
MCV: 90.3 fL (ref 80.0–100.0)
Platelets: 108 10*3/uL — ABNORMAL LOW (ref 150–400)
RBC: 2.57 MIL/uL — ABNORMAL LOW (ref 3.87–5.11)
RDW: 17.4 % — ABNORMAL HIGH (ref 11.5–15.5)
WBC: 13.8 10*3/uL — ABNORMAL HIGH (ref 4.0–10.5)
nRBC: 0.2 % (ref 0.0–0.2)

## 2020-04-06 LAB — GLUCOSE, CAPILLARY
Glucose-Capillary: 45 mg/dL — ABNORMAL LOW (ref 70–99)
Glucose-Capillary: 85 mg/dL (ref 70–99)
Glucose-Capillary: 96 mg/dL (ref 70–99)
Glucose-Capillary: 77 mg/dL (ref 70–99)
Glucose-Capillary: 111 mg/dL — ABNORMAL HIGH (ref 70–99)
Glucose-Capillary: 119 mg/dL — ABNORMAL HIGH (ref 70–99)

## 2020-04-06 LAB — BASIC METABOLIC PANEL
Anion gap: 7 (ref 5–15)
BUN: 14 mg/dL (ref 8–23)
CO2: 25 mmol/L (ref 22–32)
Calcium: 7.7 mg/dL — ABNORMAL LOW (ref 8.9–10.3)
Chloride: 109 mmol/L (ref 98–111)
Creatinine, Ser: 0.63 mg/dL (ref 0.44–1.00)
GFR, Estimated: >60 mL/min (ref >60)
Glucose, Bld: 92 mg/dL (ref 70–99)
Potassium: 4.1 mmol/L (ref 3.5–5.1)
Sodium: 141 mmol/L (ref 135–145)

## 2020-04-06 LAB — PREPARE RBC (CROSSMATCH)

## 2020-04-06 MED ORDER — INSULIN DETEMIR 100 UNIT/ML ~~LOC~~ SOLN
15.0000 [IU] | Freq: Two times a day (BID) | SUBCUTANEOUS | Status: DC
  Administered 2020-04-07 – 2020-04-11 (×9): 15 [IU] via SUBCUTANEOUS
  Filled 2020-04-06 (×13): qty 0.15

## 2020-04-06 NOTE — Progress Notes (Signed)
Patient ambulated 190 feet and tolerated well During ambulation heart rate increased 130-170, heart rate irregular and potentially atrial fibrillation RN got patient back to bed, BP 130/99 (109), oxygen saturation 100%. RN went to get EKG machine and when returned to room heart rate decreased to 105-108 and appeared regular in nature.   Patient complained of 8/10 pain from ambulation and pain medication administered. Primary RN Zara Chess notified.

## 2020-04-06 NOTE — Evaluation (Signed)
Occupational Therapy Evaluation Patient Details Name: Heidi Morse MRN: [id number] DOB: [birth year] Today's Date: 04/06/2020    History of Present Illness 65 year old female with past medical history of CHF, MI S/P ostial LAD stent on March 2021, hypertension, insulin-dependent type 2 diabetes, COPD, asthma, sleep apnea, small bowel AVM, diverticulosis, who presented to [facility] due to worsening of chest pain.  Underwent CORONARY ARTERY BYPASS GRAFTING X 2 ON CARDIOPULMONARY BYPASS. 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   Clinical Impression   Patient admitted for the above diagnosis and procedure.  PTA she lived alone, walked with a 4WRW on occasion, drive, and was independent with ADL and IADL skills.  Due to the deficits listed below, including sternal precautions, she is needing up to mod A for ADL's and min A for basic mobility.  Patient is doing well, but needs frequent verbal cues for her sternal precautions.  She is hoping to return home with assist from family and [facility] services.  OT will continue to follow to maximize her functional status.      Follow Up Recommendations  Home health OT    Equipment Recommendations  3 in 1 bedside commode, hip kit   Recommendations for Other Services       Precautions / Restrictions Precautions Precautions: Fall;Other (comment) Precaution Comments: Sternal Restrictions Weight Bearing Restrictions: Yes RUE Weight Bearing: Touch down weight bearing LUE Weight Bearing: Touch down weight bearing      Mobility Bed Mobility               General bed mobility comments: up in chair on arroval    Transfers Overall transfer level: Needs assistance   Transfers: Sit to/from Stand Sit to Stand: Min guard         General transfer comment: able to walk the first squre on 2H with PT and OT following with chair.  Watch HR.  up to 145 with mobility.    Balance Overall balance  assessment: Needs assistance Sitting-balance support: No upper extremity supported Sitting balance-Leahy Scale: Fair     Standing balance support: Bilateral upper extremity supported Standing balance-Leahy Scale: Poor Standing balance comment: needs RW.  Uses 4WRW at baseline.                           ADL either performed or assessed with clinical judgement   ADL Overall ADL's : Needs assistance/impaired Eating/Feeding: Independent;Sitting   Grooming: Wash/dry hands;Wash/dry face;Oral care;Set up;Sitting   Upper Body Bathing: Moderate assistance;Sitting   Lower Body Bathing: Maximal assistance;Sit to/from stand   Upper Body Dressing : Moderate assistance;Sitting   Lower Body Dressing: Moderate assistance;Sit to/from stand               Functional mobility during ADLs: Minimal assistance;Rolling walker General ADL Comments: Ava Walker     Vision Baseline Vision/History: Wears glasses Wears Glasses: Reading only Patient Visual Report: No change from baseline       Perception     Praxis      Pertinent Vitals/Pain Pain Assessment: Faces Faces Pain Scale: Hurts little more Pain Location: sternal incision Pain Descriptors / Indicators: Aching;Guarding;Tender Pain Intervention(s): Monitored during session     Hand Dominance Left   Extremity/Trunk Assessment Upper Extremity Assessment Upper Extremity Assessment: Overall WFL for tasks assessed   Lower Extremity Assessment Lower Extremity Assessment: Defer to PT evaluation   Cervical / Trunk Assessment Cervical / Trunk Assessment: Normal  Communication Communication Communication: No difficulties   Cognition Arousal/Alertness: Awake/alert Behavior During Therapy: WFL for tasks assessed/performed Overall Cognitive Status: Within Functional Limits for tasks assessed                                 General Comments: VC's for sternal precautions throughout.   General Comments   112 bpm, 97% 3L, 102/84    Exercises     Shoulder Instructions      Home Living Family/patient expects to be discharged to:: Private residence Living Arrangements: Alone Available Help at Discharge: Family;Available 24 hours/day Type of Home: Apartment Home Access: Level entry     Home Layout: One level     Bathroom Shower/Tub: Teacher, early years/pre: Standard     Home Equipment: Cane - single point;Walker - 4 wheels;Shower seat          Prior Functioning/Environment Level of Independence: Needs assistance  Gait / Transfers Assistance Needed: used rollator when she went out and sometimes in house ADL's / Homemaking Assistance Needed: no assist with B/D, drove for short distances, and managed bills and meds. Communication / Swallowing Assistance Needed: WFL's          OT Problem List: Decreased strength;Decreased activity tolerance;Impaired balance (sitting and/or standing);Pain      OT Treatment/Interventions: Self-care/ADL training;Therapeutic exercise;Energy conservation;DME and/or AE instruction;Balance training;Therapeutic activities    OT Goals(Current goals can be found in the care plan section) Acute Rehab OT Goals Patient Stated Goal: I want to go home, I can do therapy there. OT Goal Formulation: With patient Time For Goal Achievement: 04/20/20 Potential to Achieve Goals: Good ADL Goals Pt Will Perform Grooming: with supervision;sitting;standing Pt Will Perform Lower Body Bathing: with min assist;sit to/from stand Pt Will Perform Lower Body Dressing: with min assist;sit to/from stand Pt Will Transfer to Toilet: with supervision;ambulating;regular height toilet Pt Will Perform Toileting - Clothing Manipulation and hygiene: with supervision;sit to/from stand  OT Frequency: Min 2X/week   Barriers to D/C:    none noted       Co-evaluation PT/OT/SLP Co-Evaluation/Treatment: Yes Reason for Co-Treatment: Complexity of the patient's  impairments (multi-system involvement);For patient/therapist safety   OT goals addressed during session: ADL's and self-care      AM-PAC OT "6 Clicks" Daily Activity     Outcome Measure Help from another person eating meals?: None Help from another person taking care of personal grooming?: A Little Help from another person toileting, which includes using toliet, bedpan, or urinal?: A Lot Help from another person bathing (including washing, rinsing, drying)?: A Lot Help from another person to put on and taking off regular upper body clothing?: A Lot Help from another person to put on and taking off regular lower body clothing?: A Lot 6 Click Score: 15   End of Session Equipment Utilized During Treatment: Rolling walker;Oxygen Nurse Communication: Mobility status  Activity Tolerance: Patient tolerated treatment well Patient left: in chair;with call bell/phone within reach  OT Visit Diagnosis: Unsteadiness on feet (R26.81);Muscle weakness (generalized) (M62.81);Pain                Time: 1140-1210 OT Time Calculation (min): 30 min Charges:  OT Evaluation $OT Eval Moderate Complexity: 1 Mod  04/06/2020  Rich, OTR/L  Acute Rehabilitation Services  Office:  [phone]   Metta Clines 04/06/2020, 1:31 PM

## 2020-04-06 NOTE — Progress Notes (Signed)
Patient refused CPAP for the night  

## 2020-04-06 NOTE — Progress Notes (Signed)
[address]       [place] [address]             [phone]                 3 Days Post-Op Procedure(s) (LRB): CORONARY ARTERY BYPASS GRAFTING X 2 ON CARDIOPULMONARY BYPASS. AORTIC VALVE REPLACEMENT USING 21 MM INSPIRIS RESILIA AORTIC VALVE, STERNAL PLATING (N/A) ENDOVEIN HARVEST OF GREATER SAPHENOUS VEIN (Right) TRANSESOPHAGEAL ECHOCARDIOGRAM (TEE) (N/A) AORTIC ROOT ENLARGEMENT USING HEMASHIELD PLATINUM WOVEN DOUBLE VELOUR VASCULAR GRAFT 28 MM X 30 CM (N/A)   Events: No events. Low BG, low BP _______________________________________________________________ Vitals: BP 94/68   Pulse (!) 107   Temp 99.6 F (37.6 C) (Oral)   Resp (!) 31   Ht 5' (1.524 m)   Wt 106.1 kg   LMP  (LMP Unknown)   SpO2 100%   BMI 45.68 kg/m   - Neuro: alert NAD  - Cardiovascular: sinus tach  Drips: none.      - Pulm: wet cough.  EWOB FiO2 (%):  [1 %] 1 %  ABG    Component Value Date/Time   PHART 7.409 04/04/2020 0956   PCO2ART 28.6 (L) 04/04/2020 0956   PO2ART 84 04/04/2020 0956   HCO3 17.9 (L) 04/04/2020 0956   TCO2 19 (L) 04/04/2020 0956   ACIDBASEDEF 6.0 (H) 04/04/2020 0956   O2SAT 96.0 04/04/2020 0956    - Abd: soft - Extremity: warm  .Intake/Output      01/05 0701 01/06 0700 01/06 0701 01/07 0700   P.O. 580    I.V. (mL/kg) 100.1 (0.9)    Other 0    IV Piggyback     Total Intake(mL/kg) 680.1 (6.4)    Urine (mL/kg/hr) 2060 (0.8)    Drains 0    Stool 0    Chest Tube     Total Output 2060    Net -1379.9         Stool Occurrence 1 x       _______________________________________________________________ Labs: CBC Latest Ref Rng & Units 04/06/2020 04/05/2020 04/04/2020  WBC 4.0 - 10.5 K/uL 13.8(H) 14.0(H) 14.0(H)  Hemoglobin 12.0 - 15.0 g/dL 7.5(L) 7.9(L) 8.6(L)  Hematocrit 36.0 - 46.0 % 23.2(L) 23.8(L) 25.2(L)  Platelets 150 - 400 K/uL 108(L) 105(L) ACLMP   CMP Latest Ref Rng & Units 04/06/2020 04/05/2020 04/04/2020  Glucose 70 - 99 mg/dL 92 132(G)  401(U)  BUN 8 - 23 mg/dL 14 14 13   Creatinine 0.44 - 1.00 mg/dL 2.72 5.36  Sodium 135 - 145 mmol/L 141 140 139  Potassium 3.5 - 5.1 mmol/L 4.1 3.7 3.9  Chloride 98 - 111 mmol/L 109 110 109  CO2 22 - 32 mmol/L 25 22 22   Calcium 8.9 - 10.3 mg/dL 7.7(L) 7.7(L) 7.8(L)  Total Protein 6.5 - 8.1 g/dL - - -  Total Bilirubin 0.3 - 1.2 mg/dL - - -  Alkaline Phos 38 - 126 U/L - - -  AST 15 - 41 U/L - - -  ALT 0 - 44 U/L - - -    CXR: pending  _______________________________________________________________  Assessment and Plan: POD 3 s/p AVR CABG with annular enlargement  Neuro: pain controlled CV: hypotensive.  On midodrine.  Unable to receive BB.  On asp statin.  Will hold diuretics today.  Will transfuse 1 unit Pulm: continue pulm toilet Renal: creat stable.  Diuresing well.  Remains positive.  Holding lasix for bp.   GI: on diet Heme: transfusing  1 unit ID: afebrile Endo: decreasing insulin Dispo: continue ICU care for now   Corliss Skains 04/06/2020 8:01 AM

## 2020-04-06 NOTE — Evaluation (Addendum)
Physical Therapy Evaluation Patient Details Name: Jaziah Kwasnik MRN: [id number] DOB: March 24, 1956 Today's Date: 04/06/2020   History of Present Illness  65 year old female with past medical history of CHF, MI S/P ostial LAD stent on March 2021, hypertension, insulin-dependent type 2 diabetes, COPD, asthma, sleep apnea, small bowel AVM, diverticulosis, who presented to [facility] due to worsening of chest pain.  Underwent CORONARY ARTERY BYPASS GRAFTING X 2 ON CARDIOPULMONARY BYPASS. 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  Clinical Impression  Pt admitted with above diagnosis. Pt was able to ambulate with Carley Hammed walker on unit.  Pt needed 2 sitting rest breaks due to incr HR with ambulation.  Pt should progress well. Will follow acutely.  Pt currently with functional limitations due to the deficits listed below (see PT Problem List). Pt will benefit from skilled PT to increase their independence and safety with mobility to allow discharge to the venue listed below.      Follow Up Recommendations Home health PT;Supervision/Assistance - 24 hour    Equipment Recommendations  3in1 (PT)    Recommendations for Other Services       Precautions / Restrictions Precautions Precautions: Fall;Other (comment), VAC Precaution Booklet Issued: Yes (comment) Precaution Comments: Sternal      Mobility  Bed Mobility               General bed mobility comments: up in chair on arroval    Transfers Overall transfer level: Needs assistance   Transfers: Sit to/from Stand Sit to Stand: Min guard         General transfer comment: Pt needed cues for sternal precautions with sit to stand.  Ambulation/Gait Ambulation/Gait assistance: Min guard;Min assist;+2 safety/equipment Gait Distance (Feet): 170 Feet Assistive device:  (Eva walker) Gait Pattern/deviations: Step-through pattern;Decreased stride length;Trunk flexed;Wide base of support;Drifts  right/left;Antalgic   Gait velocity interpretation: <1.31 ft/sec, indicative of household ambulator General Gait Details: able to walk the first squre on 2H with PT and OT following with chair.  Watch HR.  up to 145 with mobility.  Stairs            Wheelchair Mobility    Modified Rankin (Stroke Patients Only)       Balance Overall balance assessment: Needs assistance Sitting-balance support: No upper extremity supported Sitting balance-Leahy Scale: Fair     Standing balance support: Bilateral upper extremity supported Standing balance-Leahy Scale: Poor Standing balance comment: Needs UE support                             Pertinent Vitals/Pain Pain Assessment: Faces Faces Pain Scale: Hurts little more Pain Location: sternal incision Pain Descriptors / Indicators: Aching;Guarding;Tender Pain Intervention(s): Limited activity within patient's tolerance;Monitored during session;Repositioned    Home Living Family/patient expects to be discharged to:: Private residence Living Arrangements: Alone Available Help at Discharge: Family;Available 24 hours/day Type of Home: Apartment Home Access: Level entry     Home Layout: One level Home Equipment: Cane - single point;Walker - 4 wheels;Shower seat      Prior Function Level of Independence: Needs assistance   Gait / Transfers Assistance Needed: used rollator when she went out and sometimes in house  ADL's / Homemaking Assistance Needed: no assist with B/D, drove for short distances, and managed bills and meds.        Hand Dominance   Dominant Hand: Left    Extremity/Trunk Assessment   Upper Extremity Assessment  Upper Extremity Assessment: Defer to OT evaluation    Lower Extremity Assessment Lower Extremity Assessment: Generalized weakness    Cervical / Trunk Assessment Cervical / Trunk Assessment: Normal  Communication   Communication: No difficulties  Cognition Arousal/Alertness:  Awake/alert Behavior During Therapy: WFL for tasks assessed/performed Overall Cognitive Status: Within Functional Limits for tasks assessed                                 General Comments: VC's for sternal precautions throughout.      General Comments General comments (skin integrity, edema, etc.): 112-155 bpm with activity, 97% 3L, 102/84    Exercises     Assessment/Plan    PT Assessment Patient needs continued PT services  PT Problem List Decreased activity tolerance;Decreased balance;Decreased mobility;Decreased knowledge of use of DME;Decreased safety awareness;Decreased knowledge of precautions;Cardiopulmonary status limiting activity;Obesity       PT Treatment Interventions DME instruction;Gait training;Functional mobility training;Balance training;Therapeutic activities;Therapeutic exercise;Patient/family education    PT Goals (Current goals can be found in the Care Plan section)  Acute Rehab PT Goals Patient Stated Goal: I want to go home, I can do therapy there. PT Goal Formulation: With patient Time For Goal Achievement: 04/20/20 Potential to Achieve Goals: Good    Frequency Min 3X/week   Barriers to discharge        Co-evaluation PT/OT/SLP Co-Evaluation/Treatment: Yes Reason for Co-Treatment: Complexity of the patient's impairments (multi-system involvement);For patient/therapist safety PT goals addressed during session: Mobility/safety with mobility         AM-PAC PT "6 Clicks" Mobility  Outcome Measure Help needed turning from your back to your side while in a flat bed without using bedrails?: A Lot Help needed moving from lying on your back to sitting on the side of a flat bed without using bedrails?: A Lot Help needed moving to and from a bed to a chair (including a wheelchair)?: A Little Help needed standing up from a chair using your arms (e.g., wheelchair or bedside chair)?: A Little Help needed to walk in hospital room?: A  Little Help needed climbing 3-5 steps with a railing? : A Little 6 Click Score: 16    End of Session Equipment Utilized During Treatment: Gait belt;Oxygen Activity Tolerance: Patient limited by fatigue Patient left: in chair;with call bell/phone within reach Nurse Communication: Mobility status PT Visit Diagnosis: Muscle weakness (generalized) (M62.81)    Time: 8366-2947 PT Time Calculation (min) (ACUTE ONLY): 35 min   Charges:   PT Evaluation $PT Eval Moderate Complexity: 1 Mod         Refael Fulop W,PT Acute Rehabilitation Services Pager:  [phone]  Office:  [phone]    Berline Lopes 04/06/2020, 8:53 PM

## 2020-04-07 LAB — BASIC METABOLIC PANEL
Anion gap: 8 (ref 5–15)
BUN: 10 mg/dL (ref 8–23)
CO2: 24 mmol/L (ref 22–32)
Calcium: 7.8 mg/dL — ABNORMAL LOW (ref 8.9–10.3)
Chloride: 104 mmol/L (ref 98–111)
Creatinine, Ser: 0.6 mg/dL (ref 0.44–1.00)
GFR, Estimated: >60 mL/min (ref >60)
Glucose, Bld: 160 mg/dL — ABNORMAL HIGH (ref 70–99)
Potassium: 4.1 mmol/L (ref 3.5–5.1)
Sodium: 136 mmol/L (ref 135–145)

## 2020-04-07 LAB — TYPE AND SCREEN
ABO/RH(D): A POS
Antibody Screen: NEGATIVE
Unit division: 0
Unit division: 0
Unit division: 0
Unit division: 0
Unit division: 0

## 2020-04-07 LAB — BPAM RBC
Blood Product Expiration Date: 202201242359
Blood Product Expiration Date: 202201242359
Blood Product Expiration Date: 202201252359
Blood Product Expiration Date: 202201252359
Blood Product Expiration Date: 202201262359
ISSUE DATE / TIME: 202201030937
ISSUE DATE / TIME: 202201030937
ISSUE DATE / TIME: 202201032357
ISSUE DATE / TIME: 202201032357
ISSUE DATE / TIME: 202201060837
Unit Type and Rh: 6200
Unit Type and Rh: 6200
Unit Type and Rh: 6200
Unit Type and Rh: 6200
Unit Type and Rh: 6200

## 2020-04-07 LAB — CBC
HCT: 27 % — ABNORMAL LOW (ref 36.0–46.0)
Hemoglobin: 8.8 g/dL — ABNORMAL LOW (ref 12.0–15.0)
MCH: 29.4 pg (ref 26.0–34.0)
MCHC: 32.6 g/dL (ref 30.0–36.0)
MCV: 90.3 fL (ref 80.0–100.0)
Platelets: 141 10*3/uL — ABNORMAL LOW (ref 150–400)
RBC: 2.99 MIL/uL — ABNORMAL LOW (ref 3.87–5.11)
RDW: 17.2 % — ABNORMAL HIGH (ref 11.5–15.5)
WBC: 12.2 10*3/uL — ABNORMAL HIGH (ref 4.0–10.5)
nRBC: 0.3 % — ABNORMAL HIGH (ref 0.0–0.2)

## 2020-04-07 LAB — GLUCOSE, CAPILLARY
Glucose-Capillary: 88 mg/dL (ref 70–99)
Glucose-Capillary: 113 mg/dL — ABNORMAL HIGH (ref 70–99)
Glucose-Capillary: 106 mg/dL — ABNORMAL HIGH (ref 70–99)
Glucose-Capillary: 109 mg/dL — ABNORMAL HIGH (ref 70–99)

## 2020-04-07 MED ORDER — SODIUM CHLORIDE 0.9% FLUSH: 10.0000 mL | INTRAVENOUS | Status: DC | PRN

## 2020-04-07 MED ORDER — SODIUM CHLORIDE 0.9% FLUSH
3.0000 mL | Freq: Two times a day (BID) | INTRAVENOUS | Status: DC
  Administered 2020-04-07 – 2020-04-11 (×9): 3 mL via INTRAVENOUS

## 2020-04-07 MED ORDER — ~~LOC~~ CARDIAC SURGERY, PATIENT & FAMILY EDUCATION: Freq: Once | Status: DC

## 2020-04-07 MED ORDER — SODIUM CHLORIDE 0.9 % IV SOLN: 250.0000 mL | INTRAVENOUS | Status: DC | PRN

## 2020-04-07 MED ORDER — SODIUM CHLORIDE 0.9% FLUSH: 3.0000 mL | INTRAVENOUS | Status: DC | PRN

## 2020-04-07 NOTE — Progress Notes (Signed)
CARDIAC REHAB PHASE I   PRE:  Rate/Rhythm: 99 SR  BP:  Sitting: 105/77      SaO2: 100 RA  MODE:  Ambulation: 100 ft   POST:  Rate/Rhythm: 120-160s irrreg  BP:  Sitting: 99/62    SaO2: 99 RA   Pt ambulated 130ft in hallway assist of one with EVA. Pt took several short standing rest breaks c/o SOB. Pt coached through purse lipped breathing. Upon return to room, pt states fluttering in her chest. HR 160s irregular with P waves in tact. Pt helped into bed. RN made aware. Pt currently demonstrating 625 on IS. Encouraged continued IS use and walks. Will continue to follow.  [id number] Reynold Bowen, RN BSN 04/07/2020 10:03 AM

## 2020-04-07 NOTE — Progress Notes (Signed)
      [address]       [place] [address]             [phone]                 4 Days Post-Op Procedure(s) (LRB): CORONARY ARTERY BYPASS GRAFTING X 2 ON CARDIOPULMONARY BYPASS. AORTIC VALVE REPLACEMENT USING 21 MM INSPIRIS RESILIA AORTIC VALVE, STERNAL PLATING (N/A) ENDOVEIN HARVEST OF GREATER SAPHENOUS VEIN (Right) TRANSESOPHAGEAL ECHOCARDIOGRAM (TEE) (N/A) AORTIC ROOT ENLARGEMENT USING HEMASHIELD PLATINUM WOVEN DOUBLE VELOUR VASCULAR GRAFT 28 MM X 30 CM (N/A)   Events: No events.  _______________________________________________________________ Vitals: BP 102/65   Pulse 99   Temp 98.2 F (36.8 C)   Resp 20   Ht 5' (1.524 m)   Wt 105.7 kg   LMP  (LMP Unknown)   SpO2 100%   BMI 45.51 kg/m   - Neuro: alert NAD  - Cardiovascular: sinus   Drips: none.      - Pulm: wet cough.  EWOB    ABG    Component Value Date/Time   PHART 7.409 04/04/2020 0956   PCO2ART 28.6 (L) 04/04/2020 0956   PO2ART 84 04/04/2020 0956   HCO3 17.9 (L) 04/04/2020 0956   TCO2 19 (L) 04/04/2020 0956   ACIDBASEDEF 6.0 (H) 04/04/2020 0956   O2SAT 96.0 04/04/2020 0956    - Abd: soft - Extremity: warm  .Intake/Output      01/06 0701 01/07 0700 01/07 0701 01/08 0700   P.Heidi. 480 200   I.V. (mL/kg) 3 (0)    Blood 339    Other     Total Intake(mL/kg) 822 (7.8) 200 (1.9)   Urine (mL/kg/hr) 1485 (0.6)    Drains 0    Stool     Total Output 1485    Net -663 +200           _______________________________________________________________ Labs: CBC Latest Ref Rng & Units 04/06/2020 04/05/2020 04/04/2020  WBC 4.0 - 10.5 K/uL 13.8(H) 14.0(H) 14.0(H)  Hemoglobin 12.0 - 15.0 g/dL 7.5(L) 7.9(L) 8.6(L)  Hematocrit 36.0 - 46.0 % 23.2(L) 23.8(L) 25.2(L)  Platelets 150 - 400 K/uL 108(L) 105(L) ACLMP   CMP Latest Ref Rng & Units 04/07/2020 04/06/2020 04/05/2020  Glucose 70 - 99 mg/dL 474(Q) 92 595(G)  BUN 8 - 23 mg/dL 10 14 14   Creatinine 0.44 - 1.00 mg/dL 3.87 5.64  Sodium 135  - 145 mmol/L 136 141 140  Potassium 3.5 - 5.1 mmol/L 4.1 4.1 3.7  Chloride 98 - 111 mmol/L 104 109 110  CO2 22 - 32 mmol/L 24 25 22   Calcium 8.9 - 10.3 mg/dL 7.8(L) 7.7(L) 7.7(L)  Total Protein 6.5 - 8.1 g/dL - - -  Total Bilirubin 0.3 - 1.2 mg/dL - - -  Alkaline Phos 38 - 126 U/L - - -  AST 15 - 41 U/L - - -  ALT 0 - 44 U/L - - -    CXR: pending  _______________________________________________________________  Assessment and Plan: POD 4 s/p AVR CABG with annular enlargement  Neuro: pain controlled CV: BP improved On midodrine.  Unable to receive BB.  On asp statin.  Will hold diuretics today.   Pulm: continue pulm toilet Renal: creat stable.  Diuresing well. Holding lasix for bp.   GI: on diet Heme: labs pending ID: [email or web address] Endo: SSI Dispo: floor today   Heidi Morse Heidi Morse 04/07/2020 9:01 AM

## 2020-04-07 NOTE — Progress Notes (Signed)
Patient ID: Heidi Morse, female   DOB: 09-08-1955, 65 y.o.   MRN: [id number] EVENING ROUNDS NOTE :     [address]       [place] [address]             [phone]                 4 Days Post-Op Procedure(s) (LRB): CORONARY ARTERY BYPASS GRAFTING X 2 ON CARDIOPULMONARY BYPASS. AORTIC VALVE REPLACEMENT USING 21 MM INSPIRIS RESILIA AORTIC VALVE, STERNAL PLATING (N/A) ENDOVEIN HARVEST OF GREATER SAPHENOUS VEIN (Right) TRANSESOPHAGEAL ECHOCARDIOGRAM (TEE) (N/A) AORTIC ROOT ENLARGEMENT USING HEMASHIELD PLATINUM WOVEN DOUBLE VELOUR VASCULAR GRAFT 28 MM X 30 CM (N/A)  Total Length of Stay:  LOS: 10 days  BP (!) 93/58   Pulse 84   Temp 99.1 F (37.3 C)   Resp 13   Ht 5' (1.524 m)   Wt 105.7 kg   LMP  (LMP Unknown)   SpO2 98%   BMI 45.51 kg/m   .Intake/Output      01/06 0701 01/07 0700 01/07 0701 01/08 0700   P.O. 480 400   I.V. (mL/kg) 3 (0)    Blood 339    Other     Total Intake(mL/kg) 822 (7.8) 400 (3.8)   Urine (mL/kg/hr) 1485 (0.6) 300 (0.3)   Drains 0    Stool     Total Output 1485 300   Net -663 +100          . sodium chloride    . sodium chloride       Lab Results  Component Value Date   WBC 12.2 (H) 04/07/2020   HGB 8.8 (L) 04/07/2020   HCT 27.0 (L) 04/07/2020   PLT 141 (L) 04/07/2020   GLUCOSE 160 (H) 04/07/2020   ALT 12 03/29/2020   AST 12 (L) 03/29/2020   NA 136 04/07/2020   K 4.1 04/07/2020   CL 104 04/07/2020   CREATININE 0.60 04/07/2020   BUN 10 04/07/2020   CO2 24 04/07/2020   INR 1.7 (H) 04/03/2020   HGBA1C 5.2 03/29/2020   Slow progress But has been walking holding sinus    Delight Ovens MD  Beeper [phone] Office [phone] 04/07/2020 4:01 PM

## 2020-04-07 NOTE — Progress Notes (Signed)
Patient refused CPAP for the evening.

## 2020-04-07 NOTE — Progress Notes (Signed)
PT Cancellation Note  Patient Details Name: Heidi Morse MRN: [id number] DOB: April 16, 1955   Cancelled Treatment:    Reason Eval/Treat Not Completed: Other (comment) (Pt just walked with Cardiac REhab. Will return as able.)   Berline Lopes 04/07/2020, 10:28 AM Naziyah Tieszen W,PT Acute Rehabilitation Services Pager:  [phone]  Office:  [phone]

## 2020-04-08 LAB — BASIC METABOLIC PANEL
Anion gap: 12 (ref 5–15)
BUN: 11 mg/dL (ref 8–23)
CO2: 25 mmol/L (ref 22–32)
Calcium: 8.4 mg/dL — ABNORMAL LOW (ref 8.9–10.3)
Chloride: 101 mmol/L (ref 98–111)
Creatinine, Ser: 0.65 mg/dL (ref 0.44–1.00)
GFR, Estimated: >60 mL/min (ref >60)
Glucose, Bld: 100 mg/dL — ABNORMAL HIGH (ref 70–99)
Potassium: 4.3 mmol/L (ref 3.5–5.1)
Sodium: 138 mmol/L (ref 135–145)

## 2020-04-08 LAB — CBC
HCT: 28.2 % — ABNORMAL LOW (ref 36.0–46.0)
Hemoglobin: 8.7 g/dL — ABNORMAL LOW (ref 12.0–15.0)
MCH: 27.9 pg (ref 26.0–34.0)
MCHC: 30.9 g/dL (ref 30.0–36.0)
MCV: 90.4 fL (ref 80.0–100.0)
Platelets: 183 10*3/uL (ref 150–400)
RBC: 3.12 MIL/uL — ABNORMAL LOW (ref 3.87–5.11)
RDW: 16.7 % — ABNORMAL HIGH (ref 11.5–15.5)
WBC: 11.4 10*3/uL — ABNORMAL HIGH (ref 4.0–10.5)
nRBC: 0.4 % — ABNORMAL HIGH (ref 0.0–0.2)

## 2020-04-08 LAB — GLUCOSE, CAPILLARY
Glucose-Capillary: 74 mg/dL (ref 70–99)
Glucose-Capillary: 107 mg/dL — ABNORMAL HIGH (ref 70–99)
Glucose-Capillary: 107 mg/dL — ABNORMAL HIGH (ref 70–99)
Glucose-Capillary: 144 mg/dL — ABNORMAL HIGH (ref 70–99)

## 2020-04-08 NOTE — Progress Notes (Signed)
Pt refused CPAP for the night. RT will continue to monitor.   

## 2020-04-08 NOTE — Progress Notes (Signed)
CARDIAC REHAB PHASE I   PRE:  Rate/Rhythm: 96 SR  BP:  Supine:   Sitting: 112/54  Standing:    SaO2: 99% 2L  MODE:  Ambulation: 160 ft   POST:  Rate/Rhythm: 105  BP:  Supine:   Sitting: 101/72  Standing:    SaO2: 100% 2L 1230-1305 Asked by RN to walk prior to transfer. Pt walked 160 ft on 2L with EVA and asst x 1. Pt stands well with little assistance. Stopped twice to rest during walk. Back to recliner after walk. DOE noted.    Luetta Nutting, RN BSN  04/08/2020 1:03 PM

## 2020-04-08 NOTE — Progress Notes (Signed)
Patient ID: Heidi Morse, female   DOB: Oct 12, 1955, 65 y.o.   MRN: [id number] TCTS DAILY ICU PROGRESS NOTE                   [address]            [place] [address]          [phone]   5 Days Post-Op Procedure(s) (LRB): CORONARY ARTERY BYPASS GRAFTING X 2 ON CARDIOPULMONARY BYPASS. AORTIC VALVE REPLACEMENT USING 21 MM INSPIRIS RESILIA AORTIC VALVE, STERNAL PLATING (N/A) ENDOVEIN HARVEST OF GREATER SAPHENOUS VEIN (Right) TRANSESOPHAGEAL ECHOCARDIOGRAM (TEE) (N/A) AORTIC ROOT ENLARGEMENT USING HEMASHIELD PLATINUM WOVEN DOUBLE VELOUR VASCULAR GRAFT 28 MM X 30 CM (N/A)  Total Length of Stay:  LOS: 11 days   Subjective: Patient awake alert neurologically intact walked better this morning around the unit Holding sinus rhythm  Objective: Vital signs in last 24 hours: Temp:  [98.4 F (36.9 C)-99.1 F (37.3 C)] 98.4 F (36.9 C) (01/08 0814) Pulse Rate:  [80-103] 103 (01/08 0800) Cardiac Rhythm: Normal sinus rhythm (01/08 0400) Resp:  [13-27] 27 (01/08 0530) BP: (81-115)/(56-83) 107/64 (01/08 0800) SpO2:  [97 %-100 %] 100 % (01/08 0800) Weight:  [104.8 kg] 104.8 kg (01/08 0500)  Filed Weights   04/06/20 0500 04/07/20 0452 04/08/20 0500  Weight: 106.1 kg 105.7 kg 104.8 kg    Weight change: -0.9 kg   Hemodynamic parameters for last 24 hours:    Intake/Output from previous day: 01/07 0701 - 01/08 0700 In: 400 [P.O.:400] Out: 1100 [Urine:1100]  Intake/Output this shift: No intake/output data recorded.  Current Meds: Scheduled Meds: . aspirin EC  325 mg Oral Daily  . atorvastatin  80 mg Oral Daily  . chlorhexidine gluconate (MEDLINE KIT)  15 mL Mouth Rinse BID  . Chlorhexidine Gluconate Cloth  6 each Topical Daily  . cholecalciferol  2,000 Units Oral Daily  . [facility], Patient & Family Education   Does not apply Once  . [facility], Patient & Family Education   Does not apply Once  . docusate sodium  200 mg Oral  Daily  . enoxaparin (LOVENOX) injection  40 mg Subcutaneous QHS  . ferrous sulfate  325 mg Oral BID  . gabapentin  300 mg Oral BID  . insulin aspart  0-24 Units Subcutaneous TID AC & HS  . insulin detemir  15 Units Subcutaneous BID  . metoprolol tartrate  12.5 mg Oral BID  . midodrine  10 mg Oral TID WC  . mometasone-formoterol  2 puff Inhalation BID  . montelukast  10 mg Oral Daily  . pantoprazole  40 mg Oral QAC breakfast  . potassium chloride  20 mEq Oral BID  . sodium chloride flush  3 mL Intravenous Q12H  . sodium chloride flush  3 mL Intravenous Q12H   Continuous Infusions: . sodium chloride    . sodium chloride     PRN Meds:.sodium chloride, sodium chloride, acetaminophen, albuterol, bisacodyl **OR** bisacodyl, guaiFENesin-dextromethorphan, ondansetron **OR** ondansetron (ZOFRAN) IV, oxyCODONE, sodium chloride flush, sodium chloride flush, sodium chloride flush, traMADol  General appearance: alert, cooperative and no distress Neurologic: intact Heart: regular rate and rhythm, S1, S2 normal, no murmur, click, rub or gallop Lungs: diminished breath sounds bibasilar Abdomen: soft, non-tender; bowel sounds normal; no masses,  no organomegaly Extremities: extremities normal, atraumatic, no cyanosis or edema and Homans sign is negative, no sign of DVT Wound: Incisional wound VAC in place  Lab Results: CBC: Recent  Labs    04/07/20 0900 04/08/20 0500  WBC 12.2* 11.4*  HGB 8.8* 8.7*  HCT 27.0* 28.2*  PLT 141* 183   BMET:  Recent Labs    04/07/20 0736 04/08/20 0500  NA 136 138  K 4.1 4.3  CL 104 101  CO2 24 25  GLUCOSE 160* 100*  BUN 10 11  CREATININE 0.60 0.65  CALCIUM 7.8* 8.4*    CMET: Lab Results  Component Value Date   WBC 11.4 (H) 04/08/2020   HGB 8.7 (L) 04/08/2020   HCT 28.2 (L) 04/08/2020   PLT 183 04/08/2020   GLUCOSE 100 (H) 04/08/2020   ALT 12 03/29/2020   AST 12 (L) 03/29/2020   NA 138 04/08/2020   K 4.3 04/08/2020   CL 101 04/08/2020    CREATININE 0.65 04/08/2020   BUN 11 04/08/2020   CO2 25 04/08/2020   INR 1.7 (H) 04/03/2020   HGBA1C 5.2 03/29/2020      PT/INR: No results for input(s): LABPROT, INR in the last 72 hours. Radiology: No results found.   Assessment/Plan: S/P Procedure(s) (LRB): CORONARY ARTERY BYPASS GRAFTING X 2 ON CARDIOPULMONARY BYPASS. AORTIC VALVE REPLACEMENT USING 21 MM INSPIRIS RESILIA AORTIC VALVE, STERNAL PLATING (N/A) ENDOVEIN HARVEST OF GREATER SAPHENOUS VEIN (Right) TRANSESOPHAGEAL ECHOCARDIOGRAM (TEE) (N/A) AORTIC ROOT ENLARGEMENT USING HEMASHIELD PLATINUM WOVEN DOUBLE VELOUR VASCULAR GRAFT 28 MM X 30 CM (N/A) Mobilize Diuresis Plan for transfer to step-down: see transfer orders Expected Acute  Blood - loss Anemia- continue to monitor  Renal function stable    Grace Isaac 04/08/2020 9:10 AM

## 2020-04-09 ENCOUNTER — Inpatient Hospital Stay (HOSPITAL_COMMUNITY): Payer: Medicaid Other

## 2020-04-09 DIAGNOSIS — I200 Unstable angina: Secondary | ICD-10-CM | POA: Diagnosis not present

## 2020-04-09 LAB — BASIC METABOLIC PANEL
Anion gap: 10 (ref 5–15)
BUN: 13 mg/dL (ref 8–23)
CO2: 25 mmol/L (ref 22–32)
Calcium: 8.6 mg/dL — ABNORMAL LOW (ref 8.9–10.3)
Chloride: 103 mmol/L (ref 98–111)
Creatinine, Ser: 0.64 mg/dL (ref 0.44–1.00)
GFR, Estimated: >60 mL/min (ref >60)
Glucose, Bld: 95 mg/dL (ref 70–99)
Potassium: 4.5 mmol/L (ref 3.5–5.1)
Sodium: 138 mmol/L (ref 135–145)

## 2020-04-09 LAB — GLUCOSE, CAPILLARY
Glucose-Capillary: 76 mg/dL (ref 70–99)
Glucose-Capillary: 107 mg/dL — ABNORMAL HIGH (ref 70–99)
Glucose-Capillary: 144 mg/dL — ABNORMAL HIGH (ref 70–99)
Glucose-Capillary: 141 mg/dL — ABNORMAL HIGH (ref 70–99)

## 2020-04-09 LAB — CBC
HCT: 28.5 % — ABNORMAL LOW (ref 36.0–46.0)
Hemoglobin: 9.1 g/dL — ABNORMAL LOW (ref 12.0–15.0)
MCH: 29.2 pg (ref 26.0–34.0)
MCHC: 31.9 g/dL (ref 30.0–36.0)
MCV: 91.3 fL (ref 80.0–100.0)
Platelets: 217 10*3/uL (ref 150–400)
RBC: 3.12 MIL/uL — ABNORMAL LOW (ref 3.87–5.11)
RDW: 16.4 % — ABNORMAL HIGH (ref 11.5–15.5)
WBC: 11.9 10*3/uL — ABNORMAL HIGH (ref 4.0–10.5)
nRBC: 0.2 % (ref 0.0–0.2)

## 2020-04-09 MED ORDER — AMIODARONE HCL 200 MG PO TABS: 400.0000 mg | ORAL_TABLET | Freq: Every day | ORAL | Status: DC

## 2020-04-09 MED ORDER — DIGOXIN 125 MCG PO TABS
0.2500 mg | ORAL_TABLET | Freq: Every day | ORAL | Status: DC
Start: 2020-04-10 — End: 2020-04-11
  Administered 2020-04-10 – 2020-04-11 (×2): 0.25 mg via ORAL
  Filled 2020-04-09 (×2): qty 2

## 2020-04-09 MED ORDER — DIGOXIN 0.25 MG/ML IJ SOLN
0.2500 mg | Freq: Four times a day (QID) | INTRAMUSCULAR | Status: AC
  Administered 2020-04-09 (×2): 0.25 mg via INTRAVENOUS
  Filled 2020-04-09 (×2): qty 1

## 2020-04-09 MED ORDER — METOPROLOL TARTRATE 25 MG PO TABS
25.0000 mg | ORAL_TABLET | Freq: Two times a day (BID) | ORAL | Status: DC
  Administered 2020-04-09 – 2020-04-11 (×5): 25 mg via ORAL
  Filled 2020-04-09 (×5): qty 1

## 2020-04-09 MED ORDER — AMIODARONE HCL 200 MG PO TABS: 400.0000 mg | ORAL_TABLET | Freq: Two times a day (BID) | ORAL | Status: DC

## 2020-04-09 MED ORDER — AMIODARONE LOAD VIA INFUSION: 150.0000 mg | Freq: Once | INTRAVENOUS | Status: DC

## 2020-04-09 MED ORDER — DIGOXIN 0.25 MG/ML IJ SOLN
0.5000 mg | Freq: Once | INTRAMUSCULAR | Status: AC
  Administered 2020-04-09: 0.5 mg via INTRAVENOUS
  Filled 2020-04-09: qty 2

## 2020-04-09 NOTE — Progress Notes (Signed)
Mobility Specialist: Progress Note   04/09/20 1635  Mobility  Activity Refused mobility   Pt refused mobility, no reason specified. Pt said she will walk later this evening or early in the morning with staff. RN present in room.   [facility] Silvina Hackleman Mobility Specialist

## 2020-04-09 NOTE — Progress Notes (Signed)
Progress Note  Patient Name: Heidi Morse Date of Encounter: 04/09/2020  [facility] Cardiologist: Minus Breeding, MD   Subjective   Chest sore   +SOB  Inpatient Medications    Scheduled Meds: . aspirin EC  325 mg Oral Daily  . atorvastatin  80 mg Oral Daily  . chlorhexidine gluconate (MEDLINE KIT)  15 mL Mouth Rinse BID  . Chlorhexidine Gluconate Cloth  6 each Topical Daily  . cholecalciferol  2,000 Units Oral Daily  . [facility], Patient & Family Education   Does not apply Once  . digoxin  0.25 mg Intravenous Q6H   Followed by  . [START ON 04/10/2020] digoxin  0.25 mg Oral Daily  . docusate sodium  200 mg Oral Daily  . enoxaparin (LOVENOX) injection  40 mg Subcutaneous QHS  . ferrous sulfate  325 mg Oral BID  . gabapentin  300 mg Oral BID  . insulin aspart  0-24 Units Subcutaneous TID AC & HS  . insulin detemir  15 Units Subcutaneous BID  . metoprolol tartrate  25 mg Oral BID  . midodrine  10 mg Oral TID WC  . mometasone-formoterol  2 puff Inhalation BID  . montelukast  10 mg Oral Daily  . pantoprazole  40 mg Oral QAC breakfast  . potassium chloride  20 mEq Oral BID  . sodium chloride flush  3 mL Intravenous Q12H  . sodium chloride flush  3 mL Intravenous Q12H   Continuous Infusions: . sodium chloride    . sodium chloride     PRN Meds: sodium chloride, sodium chloride, acetaminophen, albuterol, bisacodyl **OR** bisacodyl, guaiFENesin-dextromethorphan, ondansetron **OR** ondansetron (ZOFRAN) IV, oxyCODONE, sodium chloride flush, sodium chloride flush, sodium chloride flush, traMADol   Vital Signs    Vitals:   04/08/20 2315 04/09/20 0345 04/09/20 0735 04/09/20 0829  BP: 122/72 100/67 (!) 88/63 107/88  Pulse: 86 96 (!) 122 (!) 120  Resp: $Remo'20 20 18   'WVLVF$ Temp: 98.5 F (36.9 C) 98 F (36.7 C) 98.8 F (37.1 C)   TempSrc: Oral Oral Oral   SpO2: 96% 97% 96%   Weight:      Height:        Intake/Output Summary (Last 24 hours) at 04/09/2020  0834 Last data filed at 04/08/2020 2200 Gross per 24 hour  Intake 121 ml  Output 250 ml  Net -129 ml   Last 3 Weights 04/08/2020 04/07/2020 04/06/2020  Weight (lbs) 231 lb 0.7 oz 233 lb 0.4 oz 233 lb 14.5 oz  Weight (kg) 104.8 kg 105.7 kg 106.1 kg      Telemetry    Afib with RVR this am  - Personally Reviewed    Physical Exam    GEN: Morbidly obese 65 yo   Being adjusted in bed after urinating  Neck: Neck is full  Cardiac: Irreg irreg   No S3    Respiratory: REl clear  . GI: Soft, nontender, non-distended  MS: 1+  edema; No deformity. Neuro:  Nonfocal  Psych: Normal affect   Labs    High Sensitivity Troponin:   Recent Labs  Lab 03/28/20 0739 03/28/20 0933 03/31/20 0304 03/31/20 0458  TROPONINIHS $RemoveBefo'5 6 7 7      'ZIJuzczreAl$ Chemistry Recent Labs  Lab 04/07/20 0736 04/08/20 0500 04/09/20 0206  NA 136 138 138  K 4.1 4.3 4.5  CL 104 101 103  CO2 $Re'24 25 25  'gXO$ GLUCOSE 160* 100* 95  BUN $Re'10 11 13  'tdp$ CREATININE 0.60 0.65 0.64  CALCIUM  7.8* 8.4* 8.6*  GFRNONAA >60 >60 >60  ANIONGAP $RemoveB'8 12 10     'IgfEkYig$ Hematology Recent Labs  Lab 04/07/20 0900 04/08/20 0500 04/09/20 0206  WBC 12.2* 11.4* 11.9*  RBC 2.99* 3.12* 3.12*  HGB 8.8* 8.7* 9.1*  HCT 27.0* 28.2* 28.5*  MCV 90.3 90.4 91.3  MCH 29.4 27.9 29.2  MCHC 32.6 30.9 31.9  RDW 17.2* 16.7* 16.4*  PLT 141* 183 217    BNPNo results for input(s): BNP, PROBNP in the last 168 hours.   DDimer No results for input(s): DDIMER in the last 168 hours.   Radiology    DG Chest 2 View  Result Date: 04/09/2020 CLINICAL DATA:  Postop check. EXAM: CHEST - 2 VIEW COMPARISON:  Chest x-rays dated 04/05/2020 and 04/04/2020. FINDINGS: RIGHT IJ sheath has been removed. Median sternotomy wires appear intact and stable in alignment. Stable cardiomegaly. Overall cardiomediastinal silhouette appears stable in size and configuration. Stable opacity at the LEFT lung base, likely atelectasis and/or small pleural effusion. RIGHT lung is clear. Questionable tiny  pneumothorax at the LEFT lung apex. IMPRESSION: 1. Questionable tiny pneumothorax at the LEFT lung apex. Recommend follow-up chest x-ray later today to ensure stability or resolution. 2. Stable opacity at the LEFT lung base, likely atelectasis and/or small pleural effusion. 3. Stable cardiomegaly. These results will be called to the ordering clinician or representative by the Radiologist Assistant, and communication documented in the PACS or [employer]. Electronically Signed   By: Franki Cabot M.D.   On: 04/09/2020 07:40    Cardiac Studies  Mar 29 2020  ECHO  1. Left ventricular ejection fraction, by estimation, is 45 to 50%. The left ventricle has mildly decreased function. The left ventricle demonstrates global hypokinesis. There is mild left ventricular hypertrophy. Left ventricular diastolic parameters are consistent with Grade II diastolic dysfunction (pseudonormalization). Elevated left atrial pressure. 2. Right ventricular systolic function is normal. The right ventricular size is normal. There is normal pulmonary artery systolic pressure. The estimated right ventricular systolic pressure is 50.3 mmHg. 3. The mitral valve is abnormal. Moderate mitral valve regurgitation. Moderate mitral annular calcification. 4. The aortic valve is tricuspid. There is moderate thickening of the aortic valve. Aortic valve regurgitation is mild to moderate. Moderate aortic valve stenosis. Vmax 3.4 m/s, MG 24 mmHg, AVA 0.9 cm^2, DI 0.34 5. The inferior vena cava is normal in size with greater than 50% respiratory variability, suggesting right atrial pressure of 3 mmHg.  CATH 03/30/20   Prox RCA to Mid RCA lesion is 40% stenosed.  Ost Cx lesion is 99% stenosed.  Dist LM to Ost LAD lesion is 60% stenosed.   1. Patent left main stent with distal moderate restenosis. The stent extends into the LAD. There is haziness in the ostium of the LAD suggesting at least moderate restenosis of the stent.  Another stent extends from the left main into the Circumflex. Severe ostial Circumflex restenosis. Good targets for bypass in the obtuse marginal branches and mid LAD.   Recommendations: She has restenosis of the distal left main stented segment, ostial Circumflex stented segment and ostial LAD stented segment. She has diabetes and good targets for bypass. While balloon angioplasty of the severely restenotic segments is possible, there would not be a good long term result with high chance of recurrent restenosis. I have reviewed her images with Dr. Percival Spanish. I think the best option is to consider bypass surgery. She is relatively young and a diabetic. Will ask CT surgery to see her. Also  note that she has received Plavix today and has ongoing issues with anemia/GI bleeding. Plavix will be held going forward in anticipation of bypass surgery.    Patient Profile      Heidi Morse is a 65 y.o. female with a hx of bilateral carotid artery stenosis s/p stent placement, chronic systolic CHF, CAD s/p stenting to left main, Ost Cx and Ost LAD, HTN, ischemic CM, nonrheumatic arotic valve stenosis, DM2, PAD s/p stenting of the left common iliac artery  Patient is now POD 6 from CABG x 2 and AVR   Assessment & Plan    1 Afib with RVR   Pt has maintained SR to this point   NOte unfort she is allergic to Iodine so amiodarone contraindicated per pharmacy Getting IV digoxin now   And also metoprolol Will need to follow BP    2  Blood pressure   PT with hx of HTN   Post CABG has required midodrine for BP suppport   FOllow   3  CAD  Cath as noted above  PT is s/p LIMA to LAD and SVG to OM1    4  Moderate AS  PT s/p AVR with 21 mm Inspiris valve and annular enlargement   For questions or updates, please contact [facility] Please consult [email or web address] for contact info under        Signed, Dorris Carnes, MD  04/09/2020, 8:34 AM

## 2020-04-09 NOTE — Progress Notes (Addendum)
[name]       [place] [address]             [phone]      6 Days Post-Op Procedure(s) (LRB): CORONARY ARTERY BYPASS GRAFTING X 2 ON CARDIOPULMONARY BYPASS. AORTIC VALVE REPLACEMENT USING 21 MM INSPIRIS RESILIA AORTIC VALVE, STERNAL PLATING (N/A) ENDOVEIN HARVEST OF GREATER SAPHENOUS VEIN (Right) TRANSESOPHAGEAL ECHOCARDIOGRAM (TEE) (N/A) AORTIC ROOT ENLARGEMENT USING HEMASHIELD PLATINUM WOVEN DOUBLE VELOUR VASCULAR GRAFT 28 MM X 30 CM (N/A) Subjective: Having some intermit afib, RVR- mostly sinus  Objective: Vital signs in last 24 hours: Temp:  [98 F (36.7 C)-99 F (37.2 C)] 98 F (36.7 C) (01/09 0345) Pulse Rate:  [86-103] 96 (01/09 0345) Cardiac Rhythm: Normal sinus rhythm (01/08 2000) Resp:  [17-27] 20 (01/09 0345) BP: (87-122)/(58-82) 100/67 (01/09 0345) SpO2:  [94 %-100 %] 97 % (01/09 0345)  Hemodynamic parameters for last 24 hours:    Intake/Output from previous day: 01/08 0701 - 01/09 0700 In: 121 [P.O.:121] Out: 250 [Urine:250] Intake/Output this shift: No intake/output data recorded.  General appearance: alert, cooperative and no distress Heart: irregularly irregular rhythm and tachy Lungs: dim in bases Abdomen: benign Extremities: trace edema Wound: incis healing well EVH, provena in place- chest incision  Lab Results: Recent Labs    04/08/20 0500 04/09/20 0206  WBC 11.4* 11.9*  HGB 8.7* 9.1*  HCT 28.2* 28.5*  PLT 183 217   BMET:  Recent Labs    04/08/20 0500 04/09/20 0206  NA 138 138  K 4.3 4.5  CL 101 103  CO2 25 25  GLUCOSE 100* 95  BUN 11 13  CREATININE 0.65 0.64  CALCIUM 8.4* 8.6*    PT/INR: No results for input(s): LABPROT, INR in the last 72 hours. ABG    Component Value Date/Time   PHART 7.409 04/04/2020 0956   HCO3 17.9 (L) 04/04/2020 0956   TCO2 19 (L) 04/04/2020 0956   ACIDBASEDEF 6.0 (H) 04/04/2020 0956   O2SAT 96.0 04/04/2020 0956   CBG (last 3)  Recent Labs    04/08/20 1751  04/08/20 2058 04/09/20 0623  GLUCAP 107* 144* 76    Meds Scheduled Meds: . aspirin EC  325 mg Oral Daily  . atorvastatin  80 mg Oral Daily  . chlorhexidine gluconate (MEDLINE KIT)  15 mL Mouth Rinse BID  . Chlorhexidine Gluconate Cloth  6 each Topical Daily  . cholecalciferol  2,000 Units Oral Daily  . [facility], Patient & Family Education   Does not apply Once  . docusate sodium  200 mg Oral Daily  . enoxaparin (LOVENOX) injection  40 mg Subcutaneous QHS  . ferrous sulfate  325 mg Oral BID  . gabapentin  300 mg Oral BID  . insulin aspart  0-24 Units Subcutaneous TID AC & HS  . insulin detemir  15 Units Subcutaneous BID  . metoprolol tartrate  12.5 mg Oral BID  . midodrine  10 mg Oral TID WC  . mometasone-formoterol  2 puff Inhalation BID  . montelukast  10 mg Oral Daily  . pantoprazole  40 mg Oral QAC breakfast  . potassium chloride  20 mEq Oral BID  . sodium chloride flush  3 mL Intravenous Q12H  . sodium chloride flush  3 mL Intravenous Q12H   Continuous Infusions: . sodium chloride    . sodium chloride     PRN Meds:.sodium chloride, sodium chloride, acetaminophen, albuterol, bisacodyl **OR** bisacodyl, guaiFENesin-dextromethorphan, ondansetron **OR** ondansetron (ZOFRAN)  IV, oxyCODONE, sodium chloride flush, sodium chloride flush, sodium chloride flush, traMADol  Xrays No results found.  Assessment/Plan: S/P Procedure(s) (LRB): CORONARY ARTERY BYPASS GRAFTING X 2 ON CARDIOPULMONARY BYPASS. AORTIC VALVE REPLACEMENT USING 21 MM INSPIRIS RESILIA AORTIC VALVE, STERNAL PLATING (N/A) ENDOVEIN HARVEST OF GREATER SAPHENOUS VEIN (Right) TRANSESOPHAGEAL ECHOCARDIOGRAM (TEE) (N/A) AORTIC ROOT ENLARGEMENT USING HEMASHIELD PLATINUM WOVEN DOUBLE VELOUR VASCULAR GRAFT 28 MM X 30 CM (N/A)   1 Tmax 99,  VSS with some low relative sBP, afib with RVR, has allergy to iodine (rash/itching) so won't give amio, will load with digoxin , increase beta blocker dose 2 sats  good on RA 3 weight trending down, approx 3-4 kg above preop 4 expected ABLA is stable 5 minor leukocytosis, stable 6 normal renal fxn 7 no hypokalemia 8 BS well controlled- on insulin at home, resume previous at d/c, cont detemir for now 9 CXR stable atx, small effus   LOS: 12 days    [name] Giovanni PA-C Pager [phone] 04/09/2020  Now back in sinus, afib this am briefly Poss home 1-2 days if no further afib I have seen and examined Heidi Morse and agree with the above assessment  and plan.  Grace Isaac MD Beeper [phone] Office [phone] 04/09/2020 1:39 PM

## 2020-04-09 NOTE — Progress Notes (Signed)
PT refused CPAP for the night.

## 2020-04-09 NOTE — Progress Notes (Addendum)
Pt having periods of afib and the back to NSR, up to 150's but only last for seconds Paged wayne Gold PA will continue to monitor.

## 2020-04-10 ENCOUNTER — Other Ambulatory Visit: Payer: Self-pay

## 2020-04-10 DIAGNOSIS — Z20822 Contact with and (suspected) exposure to covid-19: Secondary | ICD-10-CM | POA: Diagnosis not present

## 2020-04-10 DIAGNOSIS — Z953 Presence of xenogenic heart valve: Secondary | ICD-10-CM | POA: Diagnosis not present

## 2020-04-10 DIAGNOSIS — I480 Paroxysmal atrial fibrillation: Secondary | ICD-10-CM | POA: Diagnosis not present

## 2020-04-10 DIAGNOSIS — I200 Unstable angina: Secondary | ICD-10-CM | POA: Diagnosis not present

## 2020-04-10 DIAGNOSIS — Z951 Presence of aortocoronary bypass graft: Secondary | ICD-10-CM

## 2020-04-10 DIAGNOSIS — T82855A Stenosis of coronary artery stent, initial encounter: Secondary | ICD-10-CM | POA: Diagnosis not present

## 2020-04-10 DIAGNOSIS — I5043 Acute on chronic combined systolic (congestive) and diastolic (congestive) heart failure: Secondary | ICD-10-CM | POA: Diagnosis not present

## 2020-04-10 DIAGNOSIS — I25110 Atherosclerotic heart disease of native coronary artery with unstable angina pectoris: Secondary | ICD-10-CM | POA: Diagnosis not present

## 2020-04-10 LAB — URINALYSIS, ROUTINE W REFLEX MICROSCOPIC
Bilirubin Urine: NEGATIVE
Glucose, UA: NEGATIVE mg/dL
Ketones, ur: NEGATIVE mg/dL
Nitrite: NEGATIVE
Protein, ur: NEGATIVE mg/dL
Specific Gravity, Urine: 1.012 (ref 1.005–1.030)
WBC, UA: >50 WBC/hpf — ABNORMAL HIGH (ref 0–5)
pH: 5 (ref 5.0–8.0)

## 2020-04-10 LAB — CBC
HCT: 34.6 % — ABNORMAL LOW (ref 36.0–46.0)
Hemoglobin: 10.1 g/dL — ABNORMAL LOW (ref 12.0–15.0)
MCH: 27.4 pg (ref 26.0–34.0)
MCHC: 29.2 g/dL — ABNORMAL LOW (ref 30.0–36.0)
MCV: 93.8 fL (ref 80.0–100.0)
Platelets: 276 10*3/uL (ref 150–400)
RBC: 3.69 MIL/uL — ABNORMAL LOW (ref 3.87–5.11)
RDW: 16.3 % — ABNORMAL HIGH (ref 11.5–15.5)
WBC: 13.8 10*3/uL — ABNORMAL HIGH (ref 4.0–10.5)
nRBC: 0.3 % — ABNORMAL HIGH (ref 0.0–0.2)

## 2020-04-10 LAB — BASIC METABOLIC PANEL
Anion gap: 12 (ref 5–15)
BUN: 15 mg/dL (ref 8–23)
CO2: 23 mmol/L (ref 22–32)
Calcium: 8.8 mg/dL — ABNORMAL LOW (ref 8.9–10.3)
Chloride: 103 mmol/L (ref 98–111)
Creatinine, Ser: 0.77 mg/dL (ref 0.44–1.00)
GFR, Estimated: >60 mL/min (ref >60)
Glucose, Bld: 109 mg/dL — ABNORMAL HIGH (ref 70–99)
Potassium: 4.8 mmol/L (ref 3.5–5.1)
Sodium: 138 mmol/L (ref 135–145)

## 2020-04-10 LAB — GLUCOSE, CAPILLARY
Glucose-Capillary: 97 mg/dL (ref 70–99)
Glucose-Capillary: 105 mg/dL — ABNORMAL HIGH (ref 70–99)
Glucose-Capillary: 122 mg/dL — ABNORMAL HIGH (ref 70–99)
Glucose-Capillary: 156 mg/dL — ABNORMAL HIGH (ref 70–99)

## 2020-04-10 MED ORDER — FUROSEMIDE 40 MG PO TABS
40.0000 mg | ORAL_TABLET | Freq: Every day | ORAL | Status: DC
  Administered 2020-04-10 – 2020-04-11 (×2): 40 mg via ORAL
  Filled 2020-04-10 (×2): qty 1

## 2020-04-10 MED ORDER — MIDODRINE HCL 5 MG PO TABS
10.0000 mg | ORAL_TABLET | Freq: Two times a day (BID) | ORAL | Status: DC
  Administered 2020-04-10 – 2020-04-11 (×3): 10 mg via ORAL
  Filled 2020-04-10 (×3): qty 2

## 2020-04-10 MED ORDER — GUAIFENESIN ER 600 MG PO TB12
600.0000 mg | ORAL_TABLET | Freq: Two times a day (BID) | ORAL | Status: DC
  Administered 2020-04-10 – 2020-04-11 (×3): 600 mg via ORAL
  Filled 2020-04-10 (×3): qty 1

## 2020-04-10 NOTE — Progress Notes (Signed)
Pt refused CPAP for the night.   

## 2020-04-10 NOTE — Progress Notes (Signed)
CARDIAC REHAB PHASE I   PRE:  Rate/Rhythm: 87 SR    BP: sitting 115/58    SaO2: 90-94 RA  MODE:  Ambulation: 180 ft   POST:  Rate/Rhythm: 115 ST    BP: sitting 105/67     SaO2: 94 RA  Pt raised HOB to move to EOB, some difficulty but is progressing. Stood independently and walked with rollator. Steady, c/o fatigue with distance. Turned and sat on rollator after 90 ft. Able to walk back and to recliner. With encouragement and instruction pt was able to twist hips deeper into recliner. Encouraged IS and another walk.  [phone]   Harriet Masson CES, ACSM 04/10/2020 1:57 PM

## 2020-04-10 NOTE — Progress Notes (Signed)
Physical Therapy Treatment Patient Details Name: Heidi Morse MRN: [id number] DOB: Oct 14, 1955 Today's Date: 04/10/2020    History of Present Illness 65 year old female with past medical history of CHF, MI S/P ostial LAD stent on March 2021, hypertension, insulin-dependent type 2 diabetes, COPD, asthma, sleep apnea, small bowel AVM, diverticulosis, who presented to [facility] due to worsening of chest pain.  Underwent CORONARY ARTERY BYPASS GRAFTING X 2 ON CARDIOPULMONARY BYPASS. 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    PT Comments    Pt received sitting EOB. She required min guard assist sit to stand and ambulation 130' with RW. Mobilized on RA. 3/4 DOE requiring standing rest break after 65'. Pulse ox not registering during amb. Place on 2L O2 upon return to room. After 1-minute seated rest, SpO2 100%. Pt returned to RA and SpO2 94% at rest in recliner.     Follow Up Recommendations  Home health PT;Supervision/Assistance - 24 hour     Equipment Recommendations  3in1 (PT)    Recommendations for Other Services       Precautions / Restrictions Precautions Precautions: Fall;Sternal Restrictions Weight Bearing Restrictions: Yes (sternal precautions)    Mobility  Bed Mobility               General bed mobility comments: Pt sitting EOB on arrival.  Transfers Overall transfer level: Needs assistance Equipment used: Rolling walker (2 wheeled) Transfers: Sit to/from Stand Sit to Stand: Min guard         General transfer comment: min guard for safety  Ambulation/Gait Ambulation/Gait assistance: Min guard Gait Distance (Feet): 130 Feet Assistive device: Rolling walker (2 wheeled) Gait Pattern/deviations: Step-through pattern;Decreased stride length;Drifts right/left Gait velocity: decreased Gait velocity interpretation: <1.31 ft/sec, indicative of household ambulator General Gait Details: Attempting to wean to RA  today. SpO2 96% at rest sitting EOB. Unable to obtain accurate SpO2 during gait due to pulse ox not registering. Standing rest break after 65'. 3/4 DOE noted and 2L O2 applied after return to room. After 1 minute seated rest break SpO2 100% and O2 removed. SpO2 94% at rest on RA in recliner.   Stairs             Wheelchair Mobility    Modified Rankin (Stroke Patients Only)       Balance Overall balance assessment: Needs assistance Sitting-balance support: No upper extremity supported;Feet supported Sitting balance-Leahy Scale: Good     Standing balance support: Bilateral upper extremity supported;During functional activity Standing balance-Leahy Scale: Poor Standing balance comment: reliant on UE support                            Cognition Arousal/Alertness: Awake/alert Behavior During Therapy: WFL for tasks assessed/performed Overall Cognitive Status: Within Functional Limits for tasks assessed                                        Exercises      General Comments General comments (skin integrity, edema, etc.): max HR 127      Pertinent Vitals/Pain Pain Assessment: Faces Faces Pain Scale: Hurts little more Pain Location: sternal incision Pain Descriptors / Indicators: Sore;Grimacing;Guarding Pain Intervention(s): Limited activity within patient's tolerance;Monitored during session;Repositioned    Home Living  Prior Function            PT Goals (current goals can now be found in the care plan section) Acute Rehab PT Goals Patient Stated Goal: home Progress towards PT goals: Progressing toward goals    Frequency    Min 3X/week      PT Plan Current plan remains appropriate    Co-evaluation              AM-PAC PT "6 Clicks" Mobility   Outcome Measure  Help needed turning from your back to your side while in a flat bed without using bedrails?: A Lot Help needed moving from lying on  your back to sitting on the side of a flat bed without using bedrails?: A Lot Help needed moving to and from a bed to a chair (including a wheelchair)?: A Little Help needed standing up from a chair using your arms (e.g., wheelchair or bedside chair)?: A Little Help needed to walk in hospital room?: A Little Help needed climbing 3-5 steps with a railing? : A Little 6 Click Score: 16    End of Session Equipment Utilized During Treatment: Gait belt;Oxygen Activity Tolerance: Patient limited by fatigue Patient left: in chair;with call bell/phone within reach Nurse Communication: Mobility status PT Visit Diagnosis: Muscle weakness (generalized) (M62.81)     Time: 2993-7169 PT Time Calculation (min) (ACUTE ONLY): 17 min  Charges:  $Gait Training: 8-22 mins                     Aida Raider, PT  Office # [phone] Pager [phone]    Ilda Foil 04/10/2020, 9:19 AM

## 2020-04-10 NOTE — Progress Notes (Signed)
Occupational Therapy Treatment Patient Details Name: Heidi Morse MRN: [id number] DOB: Aug 24, 1955 Today's Date: 04/10/2020    History of present illness 65 year old female with past medical history of CHF, MI S/P ostial LAD stent on March 2021, hypertension, insulin-dependent type 2 diabetes, COPD, asthma, sleep apnea, small bowel AVM, diverticulosis, who presented to [facility] due to worsening of chest pain.  Underwent CORONARY ARTERY BYPASS GRAFTING X 2 ON CARDIOPULMONARY BYPASS. 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   OT comments  Pt reports having just gotten back in bed from sitting in recliner. Pt declined OOB transfer or ADL participation however pt agreeable to education related to compensatory methods to maintain sternal precautions and AE to maintain precautions. Visual demonstration provided of all AE to maintain precautions with pt verbalizing understanding, additionally issued pt handout of available AE and where to purchase. Pt would benefit from practicing with equipment next session. Pt would continue to benefit from skilled occupational therapy while admitted and after d/c to address the below listed limitations in order to improve overall functional mobility and facilitate independence with BADL participation. DC plan remains appropriate, will follow acutely per POC.    Follow Up Recommendations  Home health OT    Equipment Recommendations  3 in 1 bedside commode    Recommendations for Other Services      Precautions / Restrictions Precautions Precautions: Fall;Sternal Precaution Booklet Issued: Yes (comment) Precaution Comments: pt able to state all precautions, verbally reviewed precautions in terms of ADL participation Restrictions Weight Bearing Restrictions: Yes (sternal)       Mobility Bed Mobility               General bed mobility comments: verbally reviewed log roll technique  Transfers                  General transfer comment: pt declined    Balance                                           ADL either performed or assessed with clinical judgement   ADL Overall ADL's : Needs assistance/impaired               Lower Body Bathing Details (indicate cue type and reason): education provided on using LH sponge at home for LB bathing   Upper Body Dressing Details (indicate cue type and reason): education provided on compensatory methods for UB dressing tasks d/t sternal precautions       Toilet Transfer Details (indicate cue type and reason): pt declined OOB transfer   Toileting - Clothing Manipulation Details (indicate cue type and reason): education and visual demo provided on use of toileting aids d/t sternal precautions, issued pt handout of available options to increase carryover   Tub/Shower Transfer Details (indicate cue type and reason): pt reports tub shower at home with seat   General ADL Comments: session focus on education related to AE for BADL participation in order to maintain sternal precautions     Vision       Perception     Praxis      Cognition Arousal/Alertness: Awake/alert Behavior During Therapy: [facility] for tasks assessed/performed Overall Cognitive Status: Within Functional Limits for tasks assessed  Exercises     Shoulder Instructions       General Comments VSS, issued pt AE handout and where to obtain items with pt verbalizing understanding    Pertinent Vitals/ Pain       Pain Assessment: No/denies pain  Home Living                                          Prior Functioning/Environment              Frequency  Min 2X/week        Progress Toward Goals  OT Goals(current goals can now be found in the care plan section)  Progress towards OT goals: Progressing toward goals  Acute Rehab OT Goals Patient Stated Goal:  home OT Goal Formulation: With patient Time For Goal Achievement: 04/20/20 Potential to Achieve Goals: Good  Plan Discharge plan remains appropriate;Frequency remains appropriate    Co-evaluation                 AM-PAC OT "6 Clicks" Daily Activity     Outcome Measure   Help from another person eating meals?: None Help from another person taking care of personal grooming?: A Little Help from another person toileting, which includes using toliet, bedpan, or urinal?: A Lot Help from another person bathing (including washing, rinsing, drying)?: A Lot Help from another person to put on and taking off regular upper body clothing?: A Little Help from another person to put on and taking off regular lower body clothing?: A Lot 6 Click Score: 16    End of Session Equipment Utilized During Treatment: Other (comment) (AE)  OT Visit Diagnosis: Unsteadiness on feet (R26.81);Muscle weakness (generalized) (M62.81);Pain   Activity Tolerance Patient tolerated treatment well   Patient Left in bed;with call bell/phone within reach;with bed alarm set   Nurse Communication Mobility status        Time: 0263-7858 OT Time Calculation (min): 33 min  Charges: OT General Charges $OT Visit: 1 Visit OT Treatments $Self Care/Home Management : 23-37 mins  Lenor Derrick., COTA/L Acute Rehabilitation Services [phone]   Barron Schmid 04/10/2020, 4:22 PM

## 2020-04-10 NOTE — Progress Notes (Signed)
Mobility Specialist: Progress Note   04/10/20 1638  Mobility  Activity Ambulated in hall  Level of Assistance Modified independent, requires aide device or extra time  Assistive Device Four wheel walker  Distance Ambulated (ft) 170 ft (85'x2)  Mobility Response Tolerated well  $Mobility charge 1 Mobility   Pre-Mobility: 90 HR Post-Mobility: 94 HR  Pt needed to take a seated rest break lasting 1-2 minutes at 60' due to feeling SOB and fatigued. Pt back to bed per request after walk, RN present.   [facility] Heidi Morse Mobility Specialist

## 2020-04-10 NOTE — Progress Notes (Signed)
OT Cancellation Note  Patient Details Name: Joellyn Grandt MRN: [id number] DOB: [birth year]   Cancelled Treatment:    Reason Eval/Treat Not Completed: Patient at procedure or test/ unavailable;Other (comment) pt working with cardiac rehab upon OTA arrival, will check back as time allows for OT session.  Pollyann Glen K., COTA/L Acute Rehabilitation Services [phone]   Barron Schmid 04/10/2020, 1:38 PM

## 2020-04-10 NOTE — TOC Initial Note (Signed)
Transition of Care (TOC) - Initial/Assessment Note  Donn Pierini RN, BSN Transitions of Care Unit 4E- RN Case Manager See Treatment Team for direct phone #    Patient Details  Name: Heidi Morse MRN: [id number] Date of Birth: [birth year]  Transition of Care [facility] CM/SW Contact:    Darrold Span, RN Phone Number: 04/10/2020, 1:47 PM  Clinical Narrative:                 Pt s/p CABG/AVR, noted order for HHPT, CM spoke with pt at bedside to discuss transition needs for home. Per pt she will have assistance at discharge for basic needs, has rollator and shower chair. Pt agreeable to [facility] PT- choice offered with list provided Per CMS guidelines from medicare.gov website with star ratings (copy placed in shadow chart)- per pt she states she does not have a preference and defers to [facility] to secure an agency on her behalf. Pt also thinking about a BSC for home- will f/u with pt prior to discharge. Pt still with sternal prevena wound VAC.   Call made to [facility] with [facility] for HHPT needs- referral has been accepted. They will f/u with pt post discharge to arrange visits.   Expected Discharge Plan: Home w Home Health Services Barriers to Discharge: Continued Medical Work up   Patient Goals and CMS Choice Patient states their goals for this hospitalization and ongoing recovery are:: return home CMS Medicare.gov Compare Post Acute Care list provided to:: Patient Choice offered to / list presented to : Patient  Expected Discharge Plan and Services Expected Discharge Plan: Home w Home Health Services   Discharge Planning Services: CM Consult Post Acute Care Choice: Home Health Living arrangements for the past 2 months: Apartment                           HH Arranged: PT HH [facility] Date HH Agency Contacted: 04/10/20 Time HH Agency Contacted: 1200 Representative spoke with at [facility] Agency: [facility]  Prior Living Arrangements/Services Living arrangements  for the past 2 months: Apartment Lives with:: Self Patient language and need for interpreter reviewed:: Yes Do you feel safe going back to the place where you live?: Yes      Need for Family Participation in Patient Care: Yes (Comment) Care giver support system in place?: Yes (comment) Current home services: DME (rollator, shower chair) Criminal Activity/Legal Involvement Pertinent to Current Situation/Hospitalization: No - Comment as needed  Activities of Daily Living Home Assistive Devices/Equipment: CBG Meter,Cane (specify quad or straight),Eyeglasses,CPAP,Walker (specify type) ADL Screening (condition at time of admission) Patient's cognitive ability adequate to safely complete daily activities?: Yes Is the patient deaf or have difficulty hearing?: No Does the patient have difficulty seeing, even when wearing glasses/contacts?: No Does the patient have difficulty concentrating, remembering, or making decisions?: No Patient able to express need for assistance with ADLs?: Yes Does the patient have difficulty dressing or bathing?: No Independently performs ADLs?: Yes (appropriate for developmental age) Does the patient have difficulty walking or climbing stairs?: Yes Weakness of Legs: Both Weakness of Arms/Hands: None  Permission Sought/Granted Permission sought to share information with : Facility Industrial/product designer granted to share information with : Yes, Verbal Permission Granted     Permission granted to share info w AGENCY: HH        Emotional Assessment Appearance:: Appears stated age Attitude/Demeanor/Rapport: Engaged Affect (typically observed): Appropriate Orientation: : Oriented to Self,Oriented to Place,Oriented to  Time,Oriented to Situation Alcohol / Substance Use: Not Applicable Psych Involvement: No (comment)  Admission diagnosis:  Symptomatic anemia [D64.9] Anemia, unspecified type [D64.9] Coronary artery disease [I25.10] Patient Active  Problem List   Diagnosis Date Noted  . S/P CABG x 2 04/03/2020  . S/P aortic valve replacement with bioprosthetic valve 04/03/2020  . Coronary artery disease 04/03/2020  . Unstable angina (HCC) 03/29/2020  . Aortic stenosis 03/29/2020  . Symptomatic anemia 03/28/2020   PCP:  Leola Brazil, DO Pharmacy:  No Pharmacies Listed    Social Determinants of Health (SDOH) Interventions    Readmission Risk Interventions No flowsheet data found.

## 2020-04-10 NOTE — Progress Notes (Signed)
Progress Note  Patient Name: Heidi Morse Date of Encounter: 04/10/2020  [facility] Cardiologist: Minus Breeding, MD   Subjective   Still having some chest soreness  Inpatient Medications    Scheduled Meds: . aspirin EC  325 mg Oral Daily  . atorvastatin  80 mg Oral Daily  . chlorhexidine gluconate (MEDLINE KIT)  15 mL Mouth Rinse BID  . Chlorhexidine Gluconate Cloth  6 each Topical Daily  . cholecalciferol  2,000 Units Oral Daily  . digoxin  0.25 mg Oral Daily  . docusate sodium  200 mg Oral Daily  . enoxaparin (LOVENOX) injection  40 mg Subcutaneous QHS  . ferrous sulfate  325 mg Oral BID  . furosemide  40 mg Oral Daily  . gabapentin  300 mg Oral BID  . guaiFENesin  600 mg Oral BID  . insulin aspart  0-24 Units Subcutaneous TID AC & HS  . insulin detemir  15 Units Subcutaneous BID  . metoprolol tartrate  25 mg Oral BID  . midodrine  10 mg Oral BID WC  . mometasone-formoterol  2 puff Inhalation BID  . montelukast  10 mg Oral Daily  . pantoprazole  40 mg Oral QAC breakfast  . sodium chloride flush  3 mL Intravenous Q12H  . sodium chloride flush  3 mL Intravenous Q12H   Continuous Infusions: . sodium chloride    . sodium chloride     PRN Meds: sodium chloride, sodium chloride, acetaminophen, albuterol, bisacodyl **OR** bisacodyl, guaiFENesin-dextromethorphan, ondansetron **OR** ondansetron (ZOFRAN) IV, oxyCODONE, sodium chloride flush, sodium chloride flush, sodium chloride flush, traMADol   Vital Signs    Vitals:   04/10/20 0404 04/10/20 0428 04/10/20 0740 04/10/20 0912  BP:  135/65 (!) 128/59   Pulse:  90 89   Resp:  20 19   Temp:  97.8 F (36.6 C) 99.6 F (37.6 C)   TempSrc:  Oral Oral   SpO2:  97% 100% 94%  Weight: 103.5 kg     Height:        Intake/Output Summary (Last 24 hours) at 04/10/2020 1059 Last data filed at 04/10/2020 0741 Gross per 24 hour  Intake --  Output 400 ml  Net -400 ml   Last 3 Weights 04/10/2020 04/08/2020 04/07/2020  Weight  (lbs) 228 lb 3.2 oz 231 lb 0.7 oz 233 lb 0.4 oz  Weight (kg) 103.511 kg 104.8 kg 105.7 kg      Telemetry    NSR - Personally Reviewed  ECG    NSR, NSST on 1/9 - Personally Reviewed  Physical Exam   GEN: No acute distress.   Neck: No JVD Cardiac: RRR, no murmurs, rubs, or gallops.  Respiratory: Clear to auscultation bilaterally. GI: Soft, nontender, non-distended , obese MS: No edema; No deformity. Neuro:  Nonfocal  Psych: Normal affect   Labs    High Sensitivity Troponin:   Recent Labs  Lab 03/28/20 0739 03/28/20 0933 03/31/20 0304 03/31/20 0458  TROPONINIHS $RemoveBefo'5 6 7 7      'zIXyDUuToFf$ Chemistry Recent Labs  Lab 04/08/20 0500 04/09/20 0206 04/10/20 0212  NA 138 138 138  K 4.3 4.5 4.8  CL 101 103 103  CO2 $Re'25 25 23  'Pgp$ GLUCOSE 100* 95 109*  BUN $Re'11 13 15  'srx$ CREATININE 0.65 0.64 0.77  CALCIUM 8.4* 8.6* 8.8*  GFRNONAA >60 >60 >60  ANIONGAP $RemoveB'12 10 12     'tKLcenAZ$ Hematology Recent Labs  Lab 04/08/20 0500 04/09/20 0206 04/10/20 0212  WBC 11.4* 11.9* 13.8*  RBC 3.12* 3.12*  3.69*  HGB 8.7* 9.1* 10.1*  HCT 28.2* 28.5* 34.6*  MCV 90.4 91.3 93.8  MCH 27.9 29.2 27.4  MCHC 30.9 31.9 29.2*  RDW 16.7* 16.4* 16.3*  PLT 183 217 276    BNPNo results for input(s): BNP, PROBNP in the last 168 hours.   DDimer No results for input(s): DDIMER in the last 168 hours.   Radiology    DG Chest 2 View  Result Date: 04/09/2020 CLINICAL DATA:  Postop check. EXAM: CHEST - 2 VIEW COMPARISON:  Chest x-rays dated 04/05/2020 and 04/04/2020. FINDINGS: RIGHT IJ sheath has been removed. Median sternotomy wires appear intact and stable in alignment. Stable cardiomegaly. Overall cardiomediastinal silhouette appears stable in size and configuration. Stable opacity at the LEFT lung base, likely atelectasis and/or small pleural effusion. RIGHT lung is clear. Questionable tiny pneumothorax at the LEFT lung apex. IMPRESSION: 1. Questionable tiny pneumothorax at the LEFT lung apex. Recommend follow-up chest x-ray  later today to ensure stability or resolution. 2. Stable opacity at the LEFT lung base, likely atelectasis and/or small pleural effusion. 3. Stable cardiomegaly. These results will be called to the ordering clinician or representative by the Radiologist Assistant, and communication documented in the PACS or [employer]. Electronically Signed   By: Franki Cabot M.D.   On: 04/09/2020 07:40    Cardiac Studies   EF 45-50%  Patient Profile     65 y.o. female s/p CABG, multivessel CAD.   Assessment & Plan    In NSR.  Continue to watch on tele.  ECG in AM.  BP stable. Continue midodrine for orthostatic hypotension.   S/p CABG/AVR.  Cardiac rehab.  Aggressive secondary prevention and RF modification.  Healthy diet.      For questions or updates, please contact [place] Please consult [email or web address] for contact info under        Signed, Larae Grooms, MD  04/10/2020, 10:59 AM

## 2020-04-10 NOTE — Progress Notes (Addendum)
      [address]       [place] [address]             [phone]        7 Days Post-Op Procedure(s) (LRB): CORONARY ARTERY BYPASS GRAFTING X 2 ON CARDIOPULMONARY BYPASS. AORTIC VALVE REPLACEMENT USING 21 MM INSPIRIS RESILIA AORTIC VALVE, STERNAL PLATING (N/A) ENDOVEIN HARVEST OF GREATER SAPHENOUS VEIN (Right) TRANSESOPHAGEAL ECHOCARDIOGRAM (TEE) (N/A) AORTIC ROOT ENLARGEMENT USING HEMASHIELD PLATINUM WOVEN DOUBLE VELOUR VASCULAR GRAFT 28 MM X 30 CM (N/A)  Subjective: Patient with productive cough and having trouble expectorating.  Objective: Vital signs in last 24 hours: Temp:  [97.8 F (36.6 C)-99.2 F (37.3 C)] 97.8 F (36.6 C) (01/10 0428) Pulse Rate:  [84-122] 90 (01/10 0428) Cardiac Rhythm: Normal sinus rhythm (01/09 1950) Resp:  [17-20] 20 (01/10 0428) BP: (88-135)/(47-88) 135/65 (01/10 0428) SpO2:  [91 %-97 %] 97 % (01/10 0428) Weight:  [103.5 kg] 103.5 kg (01/10 0404)  Pre op weight 101.3 kg Current Weight  04/10/20 103.5 kg       Intake/Output from previous day: No intake/output data recorded.   Physical Exam:  Cardiovascular: RRR Pulmonary: Slightly diminished bibasilar breath sounds Abdomen: Soft, non tender, bowel sounds present. Extremities: Mild bilateral lower extremity edema. Mild ecchymosis right thigh Wounds: Clean and dry.  No erythema or signs of infection. Prevena wound vac in place on sternum.  Lab Results: CBC: Recent Labs    04/09/20 0206 04/10/20 0212  WBC 11.9* 13.8*  HGB 9.1* 10.1*  HCT 28.5* 34.6*  PLT 217 276   BMET:  Recent Labs    04/09/20 0206 04/10/20 0212  NA 138 138  K 4.5 4.8  CL 103 103  CO2 25 23  GLUCOSE 95 109*  BUN 13 15  CREATININE 0.64 0.77  CALCIUM 8.6* 8.8*    PT/INR:  Lab Results  Component Value Date   INR 1.7 (H) 04/03/2020   INR 1.7 (H) 04/03/2020   ABG:  INR: Will add last result for INR, ABG once components are confirmed Will add last 4 CBG results once  components are confirmed  Assessment/Plan:  1. CV - Brief a fib yesterday am. SR this am. On Lopressor 25 mg bid,  Midodrine 10 mg tid, Digoxin 0.25 mg daily. Will decrease Midodrine to 10 mg bid. 2.  Pulmonary - On 2 liters of oxygen via [place]. Will order Mucinex bid. Encourage incentive spirometer. 3. Volume Overload - Will give daily Lasix 4.  Expected post op acute blood loss anemia - H and H this am stable at 10.1 and 34.6. On Ferrous sulfate 325 mg bid 5. CBGs 144/141/97. On Insulin. Pre op HGA1C 5.2 6. Mild leukocytosis-WBC increased to 13,800 this am. She has atelectasis and a productive cough, but no PNA on CXR. Will check UA as well. 7. Will remove Pravena sternal wound VAC later this am.  Donielle M ZimmermanPA-C 04/10/2020,7:05 AM

## 2020-04-11 ENCOUNTER — Other Ambulatory Visit: Payer: Self-pay | Admitting: Physician Assistant

## 2020-04-11 DIAGNOSIS — I25110 Atherosclerotic heart disease of native coronary artery with unstable angina pectoris: Secondary | ICD-10-CM | POA: Diagnosis not present

## 2020-04-11 DIAGNOSIS — I5043 Acute on chronic combined systolic (congestive) and diastolic (congestive) heart failure: Secondary | ICD-10-CM | POA: Diagnosis not present

## 2020-04-11 DIAGNOSIS — Z20822 Contact with and (suspected) exposure to covid-19: Secondary | ICD-10-CM | POA: Diagnosis not present

## 2020-04-11 DIAGNOSIS — T82855A Stenosis of coronary artery stent, initial encounter: Secondary | ICD-10-CM | POA: Diagnosis not present

## 2020-04-11 LAB — CBC
HCT: 30.8 % — ABNORMAL LOW (ref 36.0–46.0)
Hemoglobin: 9.3 g/dL — ABNORMAL LOW (ref 12.0–15.0)
MCH: 27.6 pg (ref 26.0–34.0)
MCHC: 30.2 g/dL (ref 30.0–36.0)
MCV: 91.4 fL (ref 80.0–100.0)
Platelets: 311 10*3/uL (ref 150–400)
RBC: 3.37 MIL/uL — ABNORMAL LOW (ref 3.87–5.11)
RDW: 16.3 % — ABNORMAL HIGH (ref 11.5–15.5)
WBC: 10.8 10*3/uL — ABNORMAL HIGH (ref 4.0–10.5)
nRBC: 0 % (ref 0.0–0.2)

## 2020-04-11 LAB — BASIC METABOLIC PANEL
Anion gap: 12 (ref 5–15)
BUN: 13 mg/dL (ref 8–23)
CO2: 27 mmol/L (ref 22–32)
Calcium: 8.8 mg/dL — ABNORMAL LOW (ref 8.9–10.3)
Chloride: 101 mmol/L (ref 98–111)
Creatinine, Ser: 0.65 mg/dL (ref 0.44–1.00)
GFR, Estimated: >60 mL/min (ref >60)
Glucose, Bld: 96 mg/dL (ref 70–99)
Potassium: 3.9 mmol/L (ref 3.5–5.1)
Sodium: 140 mmol/L (ref 135–145)

## 2020-04-11 LAB — GLUCOSE, CAPILLARY: Glucose-Capillary: 103 mg/dL — ABNORMAL HIGH (ref 70–99)

## 2020-04-11 MED ORDER — METOPROLOL TARTRATE 25 MG PO TABS: 37.5000 mg | ORAL_TABLET | Freq: Two times a day (BID) | ORAL | 1 refills | Status: DC

## 2020-04-11 MED ORDER — SULFAMETHOXAZOLE-TRIMETHOPRIM 400-80 MG PO TABS: 1.0000 | ORAL_TABLET | Freq: Two times a day (BID) | ORAL | 0 refills | Status: DC

## 2020-04-11 MED ORDER — FUROSEMIDE 40 MG PO TABS: 40.0000 mg | ORAL_TABLET | Freq: Every day | ORAL | 0 refills | Status: DC

## 2020-04-11 MED ORDER — POTASSIUM CHLORIDE CRYS ER 20 MEQ PO TBCR: 20.0000 meq | EXTENDED_RELEASE_TABLET | Freq: Once | ORAL | 0 refills | Status: DC

## 2020-04-11 MED ORDER — ASPIRIN 325 MG PO TBEC: 325.0000 mg | DELAYED_RELEASE_TABLET | Freq: Every day | ORAL | 0 refills | Status: DC

## 2020-04-11 MED ORDER — GUAIFENESIN ER 600 MG PO TB12: 600.0000 mg | ORAL_TABLET | Freq: Two times a day (BID) | ORAL | Status: DC | PRN

## 2020-04-11 MED ORDER — POTASSIUM CHLORIDE CRYS ER 20 MEQ PO TBCR
30.0000 meq | EXTENDED_RELEASE_TABLET | Freq: Once | ORAL | Status: AC
  Administered 2020-04-11: 30 meq via ORAL
  Filled 2020-04-11: qty 1

## 2020-04-11 MED ORDER — DIGOXIN 125 MCG PO TABS: 0.1250 mg | ORAL_TABLET | Freq: Every day | ORAL | 1 refills | Status: DC

## 2020-04-11 MED ORDER — CIPROFLOXACIN HCL 500 MG PO TABS: 500.0000 mg | ORAL_TABLET | Freq: Two times a day (BID) | ORAL | Status: DC

## 2020-04-11 MED ORDER — METOPROLOL TARTRATE 25 MG PO TABS: 37.5000 mg | ORAL_TABLET | Freq: Two times a day (BID) | ORAL | Status: DC

## 2020-04-11 MED ORDER — METOPROLOL TARTRATE 25 MG PO TABS: 25.0000 mg | ORAL_TABLET | Freq: Two times a day (BID) | ORAL | 1 refills | Status: DC

## 2020-04-11 MED ORDER — OXYCODONE HCL 5 MG PO TABS: 5.0000 mg | ORAL_TABLET | ORAL | 0 refills | Status: DC | PRN

## 2020-04-11 MED ORDER — CIPROFLOXACIN HCL 500 MG PO TABS: 500.0000 mg | ORAL_TABLET | Freq: Two times a day (BID) | ORAL | 0 refills | Status: DC

## 2020-04-11 MED ORDER — SULFAMETHOXAZOLE-TRIMETHOPRIM 400-80 MG PO TABS
1.0000 | ORAL_TABLET | Freq: Two times a day (BID) | ORAL | Status: DC
  Administered 2020-04-11: 1 via ORAL
  Filled 2020-04-11: qty 1

## 2020-04-11 NOTE — Progress Notes (Signed)
Discharge instructions given to patient. IV removed, clean and intact. Medications and wound care reviewed. All questions answered. Pt escorted home with son.  Osmar Howton R Alegria Dominique, RN  

## 2020-04-11 NOTE — Progress Notes (Signed)
Discussed IS, sternal precautions, walking as tolerated, and diet. She received a phone call and heard that her brother just passed. She became distraught, needing time to grieve and process. Chaplain services called. Will refer pt to [facility] (I told her this but we did not have the opportunity to discuss.) [phone] Ethelda Chick CES, ACSM 11:07 AM 04/11/2020

## 2020-04-11 NOTE — Progress Notes (Addendum)
      [address]       [place] [address]             [phone]        8 Days Post-Op Procedure(s) (LRB): CORONARY ARTERY BYPASS GRAFTING X 2 ON CARDIOPULMONARY BYPASS. AORTIC VALVE REPLACEMENT USING 21 MM INSPIRIS RESILIA AORTIC VALVE, STERNAL PLATING (N/A) ENDOVEIN HARVEST OF GREATER SAPHENOUS VEIN (Right) TRANSESOPHAGEAL ECHOCARDIOGRAM (TEE) (N/A) AORTIC ROOT ENLARGEMENT USING HEMASHIELD PLATINUM WOVEN DOUBLE VELOUR VASCULAR GRAFT 28 MM X 30 CM (N/A)  Subjective: Patient wants to shower and go home.  Objective: Vital signs in last 24 hours: Temp:  [98.3 F (36.8 C)-99.6 F (37.6 C)] 98.3 F (36.8 C) (01/10 2325) Pulse Rate:  [77-93] 77 (01/10 2325) Cardiac Rhythm: Normal sinus rhythm (01/10 2045) Resp:  [14-20] 14 (01/10 2325) BP: (100-133)/(52-64) 133/64 (01/10 2325) SpO2:  [92 %-100 %] 95 % (01/10 2325) FiO2 (%):  [21 %] 21 % (01/10 0912) Weight:  [102.9 kg] 102.9 kg (01/11 0500)  Pre op weight 101.3 kg Current Weight  04/11/20 102.9 kg       Intake/Output from previous day: 01/10 0701 - 01/11 0700 In: -  Out: 1050 [Urine:1050]   Physical Exam:  Cardiovascular: RRR Pulmonary: Slightly diminished bibasilar breath sounds Abdomen: Soft, non tender, bowel sounds present. Extremities: Mild bilateral lower extremity edema. Mild ecchymosis right thigh Wounds: Clean and dry.  No erythema or signs of infection. Sternal wound is clean and dry.  Lab Results: CBC: Recent Labs    04/10/20 0212 04/11/20 0052  WBC 13.8* 10.8*  HGB 10.1* 9.3*  HCT 34.6* 30.8*  PLT 276 311   BMET:  Recent Labs    04/10/20 0212 04/11/20 0052  NA 138 140  K 4.8 3.9  CL 103 101  CO2 23 27  GLUCOSE 109* 96  BUN 15 13  CREATININE 0.77 0.65  CALCIUM 8.8* 8.8*    PT/INR:  Lab Results  Component Value Date   INR 1.7 (H) 04/03/2020   INR 1.7 (H) 04/03/2020   ABG:  INR: Will add last result for INR, ABG once components are confirmed Will add last  4 CBG results once components are confirmed  Assessment/Plan:  1. CV - Brief a fib Sunday am. SR this am. On Lopressor 25 mg bid,  Midodrine 10 mg tid, Digoxin 0.25 mg daily. Will stop Midodrine and decrease Digoxin to 0.125 mg daily at discharge. 2.  Pulmonary - On 2 liters of oxygen via [place]. Wean as able. Will order Mucinex bid. Encourage incentive spirometer. 3. Volume Overload - Continue daily Lasix 4.  Expected post op acute blood loss anemia - H and H this am decreased to 9.3 and 30.8. On Ferrous sulfate 325 mg bid 5. DM-CBGs 122/156/103. On Insulin. Pre op HGA1C 5.2 6. Mild leukocytosis-WBC decreased to 10,800 this am. She has atelectasis and a productive cough, but no PNA on CXR. UA shows large leukocytes, >50 WBC, few bacteria. As discussed with Dr. Cliffton Asters, will treat with 5 days of antibiotic. 7. Supplement potassium 8. Will remove RLE sutures and chest tube sutures in office after discharge; Discharge today  Lelon Huh [facility] 04/11/2020,7:04 AM

## 2020-04-11 NOTE — TOC Transition Note (Signed)
Transition of Care (TOC) - CM/SW Discharge Note Donn Pierini RN, BSN Transitions of Care Unit 4E- RN Case Manager See Treatment Team for direct phone #    Patient Details  Name: Diyana Starrett MRN: [id number] Date of Birth: 12-Jan-1956  Transition of Care [facility] CM/SW Contact:  Darrold Span, RN Phone Number: 04/11/2020, 11:36 AM   Clinical Narrative:    Pt. Stable for transition home today, CM spoke with pt at bedside confirmed pt did not need BSC or any other DME. Also let patient know that [facility] had accepted [facility] referral and would be contacting her within 48 hours post discharge to schedule home visit for start of care. Pt. Voiced understanding. Son to transport home.    Final next level of care: Home w Home Health Services Barriers to Discharge: Barriers Resolved   Patient Goals and CMS Choice Patient states their goals for this hospitalization and ongoing recovery are:: return home CMS Medicare.gov Compare Post Acute Care list provided to:: Patient Choice offered to / list presented to : Patient  Discharge Placement                 Home with [facility]      Discharge Plan and Services   Discharge Planning Services: CM Consult Post Acute Care Choice: Home Health          DME Arranged: N/A DME Agency: NA       HH Arranged: PT [facility] Date HH Agency Contacted: 04/10/20 Time HH Agency Contacted: 1200 Representative spoke with at [facility] Agency: [facility]  Social Determinants of Health (SDOH) Interventions     Readmission Risk Interventions Readmission Risk Prevention Plan 04/11/2020  Transportation Screening Complete  PCP or Specialist Appt within 5-7 Days Complete  Home Care Screening Complete

## 2020-04-11 NOTE — Progress Notes (Signed)
Physical Therapy Treatment Patient Details Name: Heidi Morse MRN: [id number] DOB: [birth year] Today's Date: 04/11/2020    History of Present Illness 65 year old female with past medical history of CHF, MI S/P ostial LAD stent on March 2021, hypertension, insulin-dependent type 2 diabetes, COPD, asthma, sleep apnea, small bowel AVM, diverticulosis, who presented to [facility] due to worsening of chest pain.  Underwent CORONARY ARTERY BYPASS GRAFTING X 2 ON CARDIOPULMONARY BYPASS. 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    PT Comments    Pt supine in bed on arrival.  Pt appears down and very flat during session.  Pt does verbalize pain in chest incision.  Pt performed short bouts of gt training with noted DOE and decreased sats.  RN informed.  Pt required constant cues to maintain sternal precautions.  Plan for HHPT for continued review of sternal precautions and progression of endurance and function.      Follow Up Recommendations  Home health PT;Supervision/Assistance - 24 hour     Equipment Recommendations  3in1 (PT)    Recommendations for Other Services       Precautions / Restrictions Precautions Precautions: Fall;Sternal Precaution Booklet Issued: Yes (comment) Precaution Comments: Cues to maintain sternal precautions. Restrictions Weight Bearing Restrictions: No (sternal precautions.)    Mobility  Bed Mobility Overal bed mobility: Needs Assistance Bed Mobility: Rolling;Sidelying to Sit Rolling: Min guard Sidelying to sit: Mod assist       General bed mobility comments: Mod assistance to advance LEs to edge of bed and elevate trunk into a seated position.  Educated on utilizing assistance from family member to avoid pushing and pulling with UEs.  Transfers Overall transfer level: Needs assistance Equipment used: 4-wheeled walker Transfers: Sit to/from Stand Sit to Stand: Min guard         General transfer  comment: Cues to lock brakes on rollator during sit to stand activities.  Pt required cues to push from B knees to rise into standing and maintain sternal precautions.  Pt noted to reach back for armrests on potty chair and required cues to not use her arms "outside of the tube."  Ambulation/Gait Ambulation/Gait assistance: Supervision Gait Distance (Feet): 60 Feet (x2) Assistive device: 4-wheeled walker Gait Pattern/deviations: Step-through pattern;Decreased stride length Gait velocity: decreased   General Gait Details: Pt fatigues quickly and required seated rest break after 60 ft. SPO2 ranged from 87%-96%.  DOE noted.  Pt on RA   Stairs Stairs:  (no stairs at her home)           Wheelchair Mobility    Modified Rankin (Stroke Patients Only)       Balance Overall balance assessment: Needs assistance Sitting-balance support: No upper extremity supported;Feet supported Sitting balance-Leahy Scale: Good       Standing balance-Leahy Scale: Poor                              Cognition Arousal/Alertness: Awake/alert Behavior During Therapy: WFL for tasks assessed/performed Overall Cognitive Status: Within Functional Limits for tasks assessed                                 General Comments: VC's for sternal precautions throughout.      Exercises      General Comments        Pertinent Vitals/Pain Pain Assessment: 0-10 Pain Score: 10-Worst pain  ever Pain Location: sternal incision Pain Descriptors / Indicators: Sore;Grimacing;Guarding Pain Intervention(s): Monitored during session;Repositioned    Home Living                      Prior Function            PT Goals (current goals can now be found in the care plan section) Acute Rehab PT Goals Patient Stated Goal: home Potential to Achieve Goals: Good Progress towards PT goals: Progressing toward goals    Frequency    Min 3X/week      PT Plan Current plan remains  appropriate    Co-evaluation              AM-PAC PT "6 Clicks" Mobility   Outcome Measure  Help needed turning from your back to your side while in a flat bed without using bedrails?: A Lot Help needed moving from lying on your back to sitting on the side of a flat bed without using bedrails?: A Lot Help needed moving to and from a bed to a chair (including a wheelchair)?: A Little Help needed standing up from a chair using your arms (e.g., wheelchair or bedside chair)?: A Little Help needed to walk in hospital room?: A Little Help needed climbing 3-5 steps with a railing? : A Little 6 Click Score: 16    End of Session   Activity Tolerance: Patient limited by fatigue Patient left: in chair;with nursing/sitter in room (sitting on commode with RN present.) Nurse Communication: Mobility status PT Visit Diagnosis: Muscle weakness (generalized) (M62.81)     Time: 4098-1191 PT Time Calculation (min) (ACUTE ONLY): 15 min  Charges:  $Gait Training: 8-22 mins                     Bonney Leitz , PTA Acute Rehabilitation Services Pager [phone] Office [phone]     Heidi Morse Artis Delay 04/11/2020, 10:36 AM

## 2020-04-13 ENCOUNTER — Ambulatory Visit (INDEPENDENT_AMBULATORY_CARE_PROVIDER_SITE_OTHER): Payer: Self-pay | Admitting: Thoracic Surgery (Cardiothoracic Vascular Surgery)

## 2020-04-13 ENCOUNTER — Other Ambulatory Visit: Payer: Self-pay

## 2020-04-13 ENCOUNTER — Encounter: Payer: Self-pay | Admitting: Thoracic Surgery (Cardiothoracic Vascular Surgery)

## 2020-04-13 VITALS — BP 112/66 | HR 82 | Temp 97.5°F | Resp 20 | Ht 60.0 in | Wt 223.0 lb

## 2020-04-13 DIAGNOSIS — Z953 Presence of xenogenic heart valve: Secondary | ICD-10-CM

## 2020-04-13 DIAGNOSIS — Z951 Presence of aortocoronary bypass graft: Secondary | ICD-10-CM

## 2020-04-13 NOTE — Progress Notes (Signed)
      [address]       [place] [address]             [phone]        Heidi Morse [facility] Medical Record #[id number] Date of Birth: Mar 01, 1956  Referring: Kathleene Hazel* Primary Care: Leola Brazil, DO Primary Cardiologist:James Antoine Poche, MD  Reason for visit:   follow-up  History of Present Illness:     Overall doing well.  She is ambulating with some discomfort.  Physical Exam: BP 112/66 (BP Location: Right Arm, Patient Position: Sitting)   Pulse 82   Temp (!) 97.5 F (36.4 C)   Resp 20   Ht 5' (1.524 m)   Wt 223 lb (101.2 kg)   LMP  (LMP Unknown)   SpO2 93% Comment: RA with mask on  BMI 43.55 kg/m   Alert NAD Incision clean.  Sternum stable Abdomen soft, ND No peripheral edema       Assessment / Plan:   65 year old female status post AVR CABG with root enlargement. I was unaware that she is being seen by pain specialist at [facility], that she must contact them to obtain more pain medication.  I will see her back in 1 month with a chest x-ray.    Corliss Skains 04/13/2020 4:59 PM

## 2020-04-14 ENCOUNTER — Other Ambulatory Visit: Payer: Self-pay | Admitting: Student

## 2020-04-14 DIAGNOSIS — Z953 Presence of xenogenic heart valve: Secondary | ICD-10-CM

## 2020-04-14 DIAGNOSIS — I350 Nonrheumatic aortic (valve) stenosis: Secondary | ICD-10-CM

## 2020-04-14 NOTE — Progress Notes (Signed)
Ordered repeat Echo for 6 weeks after AVR on 04/03/2020 per protocol.  Primary Cardiologist is Dr. Antoine Poche. CT Surgeon is Dr. Cliffton Asters.  Corrin Parker, PA-C 04/14/2020 2:40 PM

## 2020-04-19 ENCOUNTER — Other Ambulatory Visit: Payer: Self-pay | Admitting: *Deleted

## 2020-04-19 ENCOUNTER — Other Ambulatory Visit: Payer: Self-pay

## 2020-04-19 NOTE — Patient Outreach (Signed)
Care Coordination  04/19/2020  Heidi Morse 1955/11/23 [id number]   Initial outreach today with Heidi Morse was rescheduled at patient's request. Home Health Nurse was in her home providing care at this appointment time. New visit rescheduled for 04/21/20 @ 3:30pm. Patient agreed to new day and time. During this call RNCM assisted HH RN with clarification of medication list.  Estanislado Emms RN, BSN [place]  [facility] RN Care Coordinator

## 2020-04-21 ENCOUNTER — Other Ambulatory Visit: Payer: Self-pay

## 2020-04-21 ENCOUNTER — Other Ambulatory Visit: Payer: Self-pay | Admitting: *Deleted

## 2020-04-21 ENCOUNTER — Telehealth (HOSPITAL_COMMUNITY): Payer: Self-pay

## 2020-04-21 DIAGNOSIS — Z139 Encounter for screening, unspecified: Secondary | ICD-10-CM

## 2020-04-21 NOTE — Patient Instructions (Signed)
Visit Information  Ms. Myhand was given information about Medicaid Managed Care team care coordination services as a part of their [facility] Community Plan Medicaid benefit. Susan Bleich verbally consented to engagement with the [facility] Managed Care team.   For questions related to your [facility], please call: [phone] or visit the homepage here: [email or web address]  If you would like to schedule transportation through your [facility], please call the following number at least 2 days in advance of your appointment: [phone]  Ms. Luckey - following are the goals we discussed in your visit today:  Goals Addressed            This Visit's Progress    Find Help in My Community       Timeframe:  Short-Term Goal Priority:  Medium Start Date:   04/21/20                          Expected End Date:   05/24/20                       - begin a notebook of services in my neighborhood or community - call 211 when I need some help - follow-up on any referrals for help I am given - make a list of family or friends that I can call    Why is this important?    Knowing how and where to find help for yourself or family in your neighborhood and community is an important skill.   You will want to take some steps to learn how.         Make and Keep All Appointments       Timeframe:  Long-Range Goal Priority:  Medium Start Date: 04/21/20                            Expected End Date:  05/24/20                        - ask family or friend for a ride - call to cancel if needed - keep a calendar with prescription refill dates - keep a calendar with appointment dates    Why is this important?    Part of staying healthy is seeing the doctor for follow-up care.   If you forget your appointments, there are some things you can do to stay on track.         [facility] My  Quality of Life       Timeframe:  Short-Term Goal Priority:  Medium Start Date: 04/21/20                            Expected End Date:  05/24/20                       -Calls provider office for new concerns or questions - check out options for in-home help - do one enjoyable thing every day    Why is this important?    Having a long-term illness can be scary.   It can also be stressful for you and your caregiver.   These steps may help.           Please see education materials related to pain provided by MyChart link.  Pain Relief Before and After Surgery  Pain relief is an important part of your overall care before, during, and after surgery. You and your health care provider will work together to make a plan to manage pain that you have before surgery (preoperative) and after surgery (postoperative). Addressing pain before surgery lessens the pain that you will have after surgery. Make sure that you fully understand and agree with your pain relief plan. If you have questions or concerns, it is important to discuss them with your health care provider. If you have pain that is not controlled by medicine, tell your health care provider. Severe pain after surgery may:  Prevent sleep.  Decrease your ability to breathe deeply and to cough. This can result in pneumonia or upper airway infections.  Cause your heart to beat more quickly.  Cause your blood pressure to be higher.  Increase your risk for stomach and digestive problems.  Slow down wound healing.  Lead to depression, anxiety, and feelings of helplessness. Your health care provider may use more than one method at a time to help relieve your pain. Using this approach may allow you to eat, move around, and possibly leave the hospital sooner. What are options for managing pain before and after surgery? Oral pain medicines Pain medicines taken by mouth (orally) include:  Non-narcotic  medicines: ? Acetaminophen. ? NSAIDs, such as ibuprofen and naproxen.  Muscle relaxants. These may relieve pain caused by muscle spasms.  Anticonvulsants. These medicines are usually used to treat seizures. They may help to lessen nerve pain.  Opioids. These medicines relieve pain by binding to pain receptors in the brain and spinal cord (narcotic pain medicines). Opioids may help relieve short-term (acute) postoperative pain that is moderate to moderately severe. ? Opioids are often combined with non-narcotic medicines to improve pain relief, lower the risk of side effects, and lower the chance of addiction. ? To help prevent addiction, opioids are given for short periods of time in careful doses. If you follow instructions from your health care provider and you do not have a history of substance abuse, your risk of becoming addicted to opioids is low. Some of these medicines may be available in injectable form. They may be given through an IV if you are unable to eat or drink. As-needed pain control You can receive pain medicine when you need it, through an IV or as a pill or liquid. When you tell your health care provider that you are having pain, he or she will give you the proper pain medicine. Medicine that numbs an area You may be given pain medicine that numbs an area (local anesthetic):  As an injection near your painful area (local infiltration).  As an injection near the nerve that provides feeling to a specific part of your body (peripheral nerve block).  As an injection in your spine (spinal block).  Through a local anesthetic reservoir pump. For this method, one or more catheters are inserted into your incision at the end of your procedure. These catheters are connected to a device that is filled with a non-narcotic pain medicine. Medicine gradually empties into your incision area over the next several days. Continuous epidural pain control With this method, you receive pain  medicine through a small, thin tube (catheter) that is inserted into your back, near your spinal cord. Medicine flows through the catheter to lessen pain in areas of your body that are below the catheter. The catheter is usually put into the back shortly  before surgery. It may be left in until you can eat, take medicine by mouth, pass urine, and have a bowel movement. This method may be recommended if you are having surgery on your abdomen, hip area, or legs. This method of pain relief may help you heal faster because you may be able to do these things sooner:  Regain normal bowel and bladder function.  Return to eating.  Get up and walk. IV patient-controlled analgesia (PCA) pump With this method, you receive pain medicine through an IV that is connected to a PCA pump. The PCA pump gives you a specific amount of medicine when you push a button. This lets you control how much medicine you receive. You are the only person who should push this button. The pump is set up so that you cannot accidentally give yourself too much medicine. You will be able to start using your PCA pump in the recovery room after your procedure. Tell your health care provider:  If you are having too much pain.  If you cannot push the button.  If you are feeling too sleepy or nauseous. Other pain control methods Other methods of pain relief after surgery include:  Heat and cold therapy.  Massage.  Topical analgesics. These are patches, creams, and gels that can be applied on the skin.  Steroid medicines. These medicines may be given to lessen swelling.  Physical therapy. A physical therapist will work with you to meet goals, such as feeling and functioning better. Physical therapy usually includes specific exercises that are tailored to your needs.  Transcutaneous electrical nerve stimulation (TENS). This method sends electrical signals through the skin to interrupt pain signals.  Cognitive behavioral therapy  (CBT). This therapy helps you learn coping skills for dealing with pain. What are some questions to ask my health care provider?  What pain relief options would be best for me?  What are the risks of each option?  What are the benefits of each option?  How long will I need pain relief after surgery? Summary  A plan to manage pain that you may have before surgery (preoperative) and after surgery (postoperative) is an important part of your overall care.  Pain management options include medicines and non-medical therapies, such as physical therapy, massage, and heat or cold therapy.  Pain management medicines include opioids and non-narcotic medicines such as NSAIDs, steroids, or local anesthetics.  Pain medicines can have side effects. Side effects of opioids include constipation, nausea, excessive sleepiness, and risk of addiction. Your health care provider will work with you to prevent or manage these side effects and risks. This information is not intended to replace advice given to you by your health care provider. Make sure you discuss any questions you have with your health care provider. Document Revised: 09/03/2019 Document Reviewed: 09/03/2019 Elsevier Patient Education  2021 [employer]   Patient verbalizes understanding of instructions provided today.   Telephone follow up appointment with Managed Medicaid care management team member scheduled for:05/24/20 @ 10:30am  Heidi Dach, RN  Following is a copy of your plan of care:  Patient Care Plan: General Plan of Care (Adult)    Problem Identified: Quality of Life (General Plan of Care)     Goal: Quality of Life Maintained   Start Date: 04/21/2020  Expected End Date: 05/24/2020  This Visit's Progress: On track  Priority: Medium  Note:   Current Barriers:   Care Coordination needs related to recent hospitalization and recovery after CABG  Ms De HollingsheadGidderon has Home Health coming out to assist her with medications, PT and  OT. She states that no one has offered to get her help with the other things that she is unable to do, like laundry and meal preparation. Ms. De HollingsheadGidderon lives alone and has family to help with shopping.  Unable to perform IADLs independently  Nurse Case Manager Clinical Goal(s):   Over the next 30 days, patient will verbalize understanding of plan for PCS  Over the next 30 days, patient will work with Care Guide (community agency) to arrange for assistance with IADLs  Interventions:   Inter-disciplinary care team collaboration (see longitudinal plan of care)  Reviewed medications with patient and discussed that she is a pain clinic patient and she can only receive pain medication from pain management provider  Discussed plans with patient for ongoing care management follow up and provided patient with direct contact information for care management team  Care Guide referral for assistance with IADLs  Pharmacy referral for medication review  Encouraged patient to do something that she enjoys daily to help manage her pain  Patient Goals/Self-Care Activities Over the next 30 days, patient will:  -Calls provider office for new concerns or questions  -check out options for in-home help - do one enjoyable thing every day - ask family or friend for a ride - call to cancel if needed - keep a calendar with prescription refill dates - keep a calendar with appointment dates - begin a notebook of services in my neighborhood or community - call 211 when I need some help - follow-up on any referrals for help I am given - make a list of family or friends that I can call   Follow Up Plan: Telephone follow up appointment with Managed Medicaid care management team member scheduled for:05/24/20 @ 10:30am

## 2020-04-21 NOTE — Telephone Encounter (Signed)
Per phase I, faxed cardiac rehab referral to [facility].

## 2020-04-21 NOTE — Patient Outreach (Signed)
Medicaid Managed Care   Nurse Care Manager Note  04/21/2020 Name:  Heidi Morse MRN:  [id number] DOB:  [birth year]  Heidi Morse is an 65 y.o. year old female who is a primary patient of Heidi Brazil, DO.  The Heidi Morse Managed Care Coordination team was consulted for assistance with:    CHF  Heidi Morse was given information about Medicaid Managed Care Coordination team services today. Heidi Morse agreed to services and verbal consent obtained.  Engaged with patient by telephone for initial visit in response to provider referral for case management and/or care coordination services.   Assessments/Interventions:  Review of past medical history, allergies, medications, health status, including review of consultants reports, laboratory and other test data, was performed as part of comprehensive evaluation and provision of chronic care management services.  SDOH (Social Determinants of Health) assessments and interventions performed:   Care Plan  Allergies  Allergen Reactions  . Peanut-Containing Drug Products Hives and Swelling  . Statins Other (See Comments)    myalgia  . Iodine Itching and Rash    unk reaction per pt    Medications Reviewed Today    Reviewed by Heidi Dach, RN (Registered Nurse) on 04/21/20 at 1555  Med List Status: <None>  Medication Order Taking? Sig Documenting Provider Last Dose Status Informant  acetaminophen (TYLENOL) 325 MG tablet [id number] No Take 2 tablets (650 mg total) by mouth every 6 (six) hours as needed for mild pain.  Patient not taking: Reported on 04/21/2020   Barrett, Heidi Roam, PA-C Not Taking Active   albuterol (VENTOLIN HFA) 108 (90 Base) MCG/ACT inhaler [id number] Yes Inhale 2 puffs into the lungs every 6 (six) hours as needed for wheezing. [provider] Taking Active Self  aspirin EC 325 MG EC tablet [id number] Yes Take 1 tablet (325 mg total) by mouth daily. Heidi Balls, PA-C Taking Active   atorvastatin  (LIPITOR) 80 MG tablet [id number] Yes Take 80 mg by mouth daily. [provider] Taking Active Self  Cholecalciferol 25 MCG (1000 UT) tablet [id number] Yes Take 2,000 Units by mouth daily. [provider] Taking Active Self  digoxin (LANOXIN) 0.125 MG tablet [id number] Yes Take 1 tablet (0.125 mg total) by mouth daily. Heidi Balls, PA-C Taking Active   famotidine (PEPCID) 20 MG tablet [id number] No Take 20 mg by mouth daily.  Patient not taking: Reported on 04/21/2020   [provider] Not Taking Active Self  ferrous sulfate 325 (65 FE) MG tablet [id number] Yes Take 1 tablet by mouth 2 (two) times daily. [provider] Taking Active Self  Fluticasone-Salmeterol (ADVAIR) 250-50 MCG/DOSE AEPB [id number] Yes Inhale 1 puff into the lungs in the morning and at bedtime. [provider] Taking Active Self  furosemide (LASIX) 40 MG tablet [id number] No Take 1 tablet (40 mg total) by mouth daily. For 5 days then stop.  Patient not taking: Reported on 04/21/2020   Heidi Balls, PA-C Not Taking Active   gabapentin (NEURONTIN) 300 MG capsule [id number] Yes Take 300 mg by mouth in the morning, at noon, in the evening, and at bedtime. [provider] Taking Active Self  guaiFENesin (MUCINEX) 600 MG 12 hr tablet [id number] Yes Take 1 tablet (600 mg total) by mouth 2 (two) times daily as needed for cough or to loosen phlegm. Heidi Balls, PA-C Taking Active   HYDROcodone-acetaminophen [facility]) 10-325 MG tablet [id number] Yes Take 1 tablet by mouth every 6 (six) hours as needed. [provider] Taking Active  insulin aspart (NOVOLOG) 100 UNIT/ML injection [id number] Yes Inject into the skin 3 (three) times daily before meals. [provider] Taking Active   insulin glargine (LANTUS) 100 UNIT/ML injection [id number] No Inject 24 Units into the skin 2 (two) times daily as needed (for elevated blood over 150).  Patient not taking:  Reported on 04/21/2020   [provider] Not Taking Active Self  metoprolol tartrate (LOPRESSOR) 25 MG tablet [id number] Yes Take 1.5 tablets (37.5 mg total) by mouth 2 (two) times daily. Heidi Fudge M, PA-C Taking Active   montelukast (SINGULAIR) 10 MG tablet [id number] Yes Take 10 mg by mouth daily. [provider] Taking Active Self  OMEPRAZOLE PO [id number] Yes Take by mouth. [provider] Taking Active   oxyCODONE (OXY IR/ROXICODONE) 5 MG immediate release tablet [id number] No Take 1 tablet (5 mg total) by mouth every 4 (four) hours as needed for severe pain.  Patient not taking: Reported on 04/21/2020   Heidi Balls, PA-C Not Taking Active   potassium chloride (KLOR-CON) 20 MEQ tablet [id number]  Take 1 tablet (20 mEq total) by mouth once for 1 dose. For 5 days then stop. Heidi Balls, PA-C  Expired 04/11/20 2359   sulfamethoxazole-trimethoprim (BACTRIM) 400-80 MG tablet [id number] No Take 1 tablet by mouth every 12 (twelve) hours.  Patient not taking: Reported on 04/21/2020   Heidi Balls, PA-C Not Taking Active   valACYclovir (VALTREX) 500 MG tablet [id number] Yes Take 500 mg by mouth as needed (for flare ups). [provider] Taking Active Self          Patient Active Problem List   Diagnosis Date Noted  . S/P CABG x 2 04/03/2020  . S/P aortic valve replacement with bioprosthetic valve 04/03/2020  . Coronary artery disease 04/03/2020  . Unstable angina (HCC) 03/29/2020  . Aortic stenosis 03/29/2020  . Symptomatic anemia 03/28/2020    Conditions to be addressed/monitored per PCP order:  CHF  Care Plan : General Plan of Care (Adult)  Updates made by Heidi Dach, RN since 04/21/2020 12:00 AM    Problem: Quality of Life (General Plan of Care)     Goal: Quality of Life Maintained   Start Date: 04/21/2020  Expected End Date: 05/24/2020  This Visit's Progress: On track  Priority: Medium  Note:   Current  Barriers:  . Care Coordination needs related to recent hospitalization and recovery after CABG  Heidi Morse has Home Health coming out to assist her with medications, PT and OT. She states that no one has offered to get her help with the other things that she is unable to do, like laundry and meal preparation. Heidi. Goren lives alone and has family to help with shopping. . Unable to perform IADLs independently  Nurse Case Manager Clinical Goal(s):  Marland Kitchen Over the next 30 days, patient will verbalize understanding of plan for PCS . Over the next 30 days, patient will work with Care Guide (community agency) to arrange for assistance with IADLs  Interventions:  . Inter-disciplinary care team collaboration (see longitudinal plan of care) . Reviewed medications with patient and discussed that she is a pain clinic patient and she can only receive pain medication from pain management provider . Discussed plans with patient for ongoing care management follow up and provided patient with direct contact information for care management team . Care Guide referral for assistance with IADLs . Pharmacy referral for medication review . Encouraged patient to do something that she enjoys daily  to help manage her pain  Patient Goals/Self-Care Activities Over the next 30 days, patient will:  -Calls provider office for new concerns or questions  -check out options for in-home help - do one enjoyable thing every day - ask family or friend for a ride - call to cancel if needed - keep a calendar with prescription refill dates - keep a calendar with appointment dates - begin a notebook of services in my neighborhood or community - call 211 when I need some help - follow-up on any referrals for help I am given - make a list of family or friends that I can call   Follow Up Plan: Telephone follow up appointment with Managed Medicaid care management team member scheduled for:05/24/20 @ 10:30am        Follow Up:   Patient agrees to Care Plan and Follow-up.  Plan: The Managed Medicaid care management team will reach out to the patient again over the next 30 days.  Date/time of next scheduled RN care management/care coordination outreach: 05/24/20  Estanislado Emms RN, BSN [place]  [facility] RN Care Coordinator

## 2020-04-27 ENCOUNTER — Other Ambulatory Visit: Payer: Self-pay

## 2020-04-27 ENCOUNTER — Telehealth: Payer: Self-pay | Admitting: Internal Medicine

## 2020-04-27 NOTE — Patient Outreach (Signed)
Patient assured me she had no appointment scheduled for today and didn't have any time to talk. She spoke with me for 15 minutes about how she was in a rush and didn't have an appointment scheduled. Would like to re-schedule for next month

## 2020-04-27 NOTE — Telephone Encounter (Signed)
   Telephone encounter was:  Successful.  04/27/2020 Name: Heidi Morse MRN: [id number] DOB: 01/26/1956  Heidi Morse is a 65 y.o. year old female who is a primary care patient of Leola Brazil, DO . The community resource team was consulted for assistance with in home care after heart surgery  Care guide performed the following interventions: Patient provided with information about care guide support team and interviewed to confirm resource needs Investigation of community resources performed Discussed resources to assist with in home care. Obtained verbal consent to place patient referral to [facility] Placed referral to Rae Halsted with [facility] [phone] via secured email. Pt is in network with this in home care service. .  Follow Up Plan:  Care guide will outreach resources to assist patient with in home care and make sure that Drinda Butts with [facility] can pick up this patient referral.   Rojelio Brenner Care Guide, Embedded Care Coordination [facility], Care Management Phone: [phone] Email: [email or web address]

## 2020-04-27 NOTE — Telephone Encounter (Signed)
° °  Telephone encounter was:  Unsuccessful.  04/27/2020 Name: Heidi Morse MRN: [id number] DOB: 11/03/1955  Unsuccessful outbound call made today to assist with:  in home care while recovering from heart surgery. I need verbal permission to give a referral to [facility].   Outreach Attempt:  1st Attempt  A HIPAA compliant voice message was left requesting a return call.  Instructed patient to call back at [phone].  Rojelio Brenner Care Guide, Embedded Care Coordination [facility], Care Management Phone: [phone] Email: [email or web address]

## 2020-04-28 ENCOUNTER — Ambulatory Visit: Payer: Medicaid Other | Admitting: Medical

## 2020-04-29 NOTE — Progress Notes (Signed)
Cardiology Office Note:    Date:  05/01/2020   ID:  Heidi Morse Wimbish, DOB May 14, 1955, MRN [id number]  PCP:  Leola BrazilEverly, Rebecca B, DO  Cardiologist:  Rollene RotundaJames Hochrein, MD  Electrophysiologist:  None   Referring MD: Leola BrazilEverly, Rebecca B, DO   Chief Complaint: hospital follow-up after recent CABG/AVR  History of Present Illness:    Heidi Morse Heidi Morse is a 65 y.o. female with a history of CAD s/p PCI to left main/ostial LAD/ostial CX and recent CABG x2 (LIMA to LAD and SVG to OM1) on 04/03/2020, aortic stenosis s/p AVR at time of CABG, post-op atrial fibrillation, ischemic cardiomyopathy with chronic systolic CHF and EF of 45-50%, PAD s/p stenting of left common iliac artery in 11/2017, bilateral carotid artery stenosis s/p stenting of left ICA, COPD/asthma, obstructive sleep apnea, hypertension, type 2 diabetes mellitus, GI bleeding requiring transfusions, obesity, and chronic pain on narcotics who is a patient of Dr. Antoine PocheHochrein and presents today for hospital follow-up after recent CABG/AVR.  Patient previously followed at [facility] her cardiac care. She has had multiple cardiac catheterizations. From June 2020 to March 2021, she underwent 5 cardiac catheterization and was treated with stenting to left main, ostial LAD, and ostial CX. She was seen by CT surgery on 2 occasions but it was felt that her CAD could be better treated with PCI. She has had chronic chest pain some of which was felt to be non-cardiac in nature. She also has significant PAD of carotid arteries and lower extremities. She has undergone stenting to left internal carotid arteries as well as left common iliac artery in the past.  Patient has a history of GI bleeding and anemia. 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 She was recently admitted from 03/23/2020 to 03/24/2020 at [facility] for anemia/recent GI bleed and chest pain. Troponin was negative and EKG was non-ischemic. Imaging was negative for active GI bleed.  Therefore, she was discharged on Plavix and Aspirin. She returned to [facility] ED on 03/27/2020 for persistent chest pain relieved by Nitro. Work-up was unremarkable. It was again felt to be non-cardiac in origin. Therefore, se was discharged.   Family was frustrated with persistent pain so patient decided to come to [facility] on 03/28/2020 for second opinion and additional evaluation. On presentation , high-sensitivity troponin was negative and hemoglobin was 7.6. She received 1 unit of PRBC and GI was consulted. There was no active bleed and GI did not feel like any intervention was needed. Given significant cardiac history, Cardiology was consulted and decision was made to proceed with cardiac catheterizatoin. LHC showed patent left main stent with distal moderate restenosis. This stent extended into the LAD and there was haziness in the ostium of the LAD suggesting at least moderate restenosis of the stent. Thre was also severe restenosis of ostial CX stent. Echo showed LVEF of 45-50% with mild LVH, grade 2 diastolic dysfunction, mild to moderate AI, moderate AS, and moderate MR. CT surgery was consulted and patient ended up undergoing CABG x2 (LIMA to LAD and RSVG to OM1) as well as AVR on 04/03/2020 with Dr. Cliffton AstersLightfoot. She developed post-op atrial fibrillation with RVR on 04/09/2020. Given iodine allergy, she was unable to be started on Amiodarone. Therefore, she was started on Digoxin and beta-blocker was increased. She ultimately converted back to sinus rhythm. She was not started on anticoagulation. She was started on Lasix post-op for volume overload. She was also treated with Bactrim post-op for a URI. Patient was discharged on  04/11/2020. She was discharged on Aspirin 325mg  daily, Lopressor 37.5mg  twice daily, Digoxin 0.125mg  daily, Lasix 40mg  daily x5 days, and Lipitor 80mg  dialy. Home Plavix, Amlodipine, and Lisinopril were also stopped on dischcarge.  She was seen by Dr. in the office on  04/13/2020 for follow-up at which time she reported some discomfort with ambulating but was overall doing well. Patient follows with pain clinic at [facility] so she was advised to reach out to them for more pain medication.  Patient presents today for follow-up. Patient doing relatively well since discharge. She continues to have some chest soreness from CABG as well as some occasional sharp left sided chest pain. This usually occurs when she is bathing herself but can also occur at rest. She also notes pain when laying on her sides. However, no chest pain similar to what she was having before recent admission. She continues to have some shortness of breath with activity but is not sure how this compares to her baseline. No orthopnea, PND, or significant edema. No palpitations. She has had 2 episodes of brief lightheadedness/dizziness since discharge but no syncope.  She is very concerned that she still has sutures in her right leg. The skin is starting to grow over the sutures and she was hoping we could remove them today. Scar is well healed. No erythema and not warm to the touch.   Past Medical History:  Diagnosis Date  . Asthma   . CHF (congestive heart failure) (HCC)   . COPD (chronic obstructive pulmonary disease) (HCC)   . Diabetes mellitus without complication (HCC)   . Hypertension   . MI (myocardial infarction) (HCC)   . Sleep apnea     Past Surgical History:  Procedure Laterality Date  . AORTIC ROOT ENLARGEMENT N/A 04/03/2020   Procedure: AORTIC ROOT ENLARGEMENT USING HEMASHIELD PLATINUM WOVEN DOUBLE VELOUR VASCULAR GRAFT 28 MM X 30 CM;  Surgeon: 04/15/2020, MD;  Location: [facility] OR;  Service: Open Heart Surgery;  Laterality: N/A;  . AORTIC VALVE REPLACEMENT N/A 04/03/2020   Procedure: CORONARY ARTERY BYPASS GRAFTING X 2 ON CARDIOPULMONARY BYPASS. AORTIC VALVE REPLACEMENT USING 21 MM INSPIRIS RESILIA AORTIC VALVE, STERNAL PLATING;  Surgeon: Corliss Skains, MD;  Location: [facility]  OR;  Service: Open Heart Surgery;  Laterality: N/A;  Coronary artery bypass grafting  Flow Trac  . ENDOVEIN HARVEST OF GREATER SAPHENOUS VEIN Right 04/03/2020   Procedure: ENDOVEIN HARVEST OF GREATER SAPHENOUS VEIN;  Surgeon: 06/01/2020, MD;  Location: [facility] OR;  Service: Open Heart Surgery;  Laterality: Right;  . LEFT HEART CATH AND CORONARY ANGIOGRAPHY N/A 03/30/2020   Procedure: LEFT HEART CATH AND CORONARY ANGIOGRAPHY;  Surgeon: 06/01/2020, MD;  Location: [facility] INVASIVE CV LAB;  Service: Cardiovascular;  Laterality: N/A;  . TEE WITHOUT CARDIOVERSION N/A 04/03/2020   Procedure: TRANSESOPHAGEAL ECHOCARDIOGRAM (TEE);  Surgeon: 04/01/2020, MD;  Location: [facility] OR;  Service: Open Heart Surgery;  Laterality: N/A;    Current Medications: Current Meds  Medication Sig  . acetaminophen (TYLENOL) 325 MG tablet Take 2 tablets (650 mg total) by mouth every 6 (six) hours as needed for mild pain.  06/01/2020 albuterol (VENTOLIN HFA) 108 (90 Base) MCG/ACT inhaler Inhale 2 puffs into the lungs every 6 (six) hours as needed for wheezing.  Corliss Skains aspirin EC 325 MG EC tablet Take 1 tablet (325 mg total) by mouth daily.  [facility] atorvastatin (LIPITOR) 80 MG tablet Take 80 mg by mouth daily.  . Cholecalciferol 25 MCG (1000  UT) tablet Take 2,000 Units by mouth daily.  . digoxin (LANOXIN) 0.125 MG tablet Take 1 tablet (0.125 mg total) by mouth daily.  . ferrous sulfate 325 (65 FE) MG tablet Take 1 tablet by mouth 2 (two) times daily.  . Fluticasone-Salmeterol (ADVAIR) 250-50 MCG/DOSE AEPB Inhale 1 puff into the lungs in the morning and at bedtime.  . gabapentin (NEURONTIN) 300 MG capsule Take 300 mg by mouth in the morning, at noon, in the evening, and at bedtime.  Marland Kitchen guaiFENesin (MUCINEX) 600 MG 12 hr tablet Take 1 tablet (600 mg total) by mouth 2 (two) times daily as needed for cough or to loosen phlegm.  Marland Kitchen HYDROcodone-acetaminophen (NORCO) 10-325 MG tablet Take 1 tablet by mouth every 6 (six) hours as needed.   . insulin aspart (NOVOLOG) 100 UNIT/ML injection Inject into the skin 3 (three) times daily before meals.  . insulin glargine (LANTUS) 100 UNIT/ML injection Inject 24 Units into the skin as directed.  . metoprolol tartrate (LOPRESSOR) 25 MG tablet Take 1.5 tablets (37.5 mg total) by mouth 2 (two) times daily.  . montelukast (SINGULAIR) 10 MG tablet Take 10 mg by mouth daily.  Marland Kitchen OMEPRAZOLE PO Take by mouth.  . sulfamethoxazole-trimethoprim (BACTRIM) 400-80 MG tablet Take 1 tablet by mouth every 12 (twelve) hours.  . valACYclovir (VALTREX) 500 MG tablet Take 500 mg by mouth as needed (for flare ups).  . [DISCONTINUED] insulin glargine (LANTUS) 100 UNIT/ML injection Inject 24 Units into the skin 2 (two) times daily as needed (for elevated blood over 150).     Allergies:   Peanut oil, Peanut-containing drug products, Povidone-iodine, Shellfish allergy, Statins, and Iodine   Social History   Socioeconomic History  . Marital status: Single    Spouse name: Not on file  . Number of children: Not on file  . Years of education: Not on file  . Highest education level: Not on file  Occupational History  . Not on file  Tobacco Use  . Smoking status: Never Smoker  . Smokeless tobacco: Never Used  Substance and Sexual Activity  . Alcohol use: Not on file  . Drug use: Not on file  . Sexual activity: Not on file  Other Topics Concern  . Not on file  Social History Narrative  . Not on file   Social Determinants of Health   Financial Resource Strain: Not on file  Food Insecurity: Not on file  Transportation Needs: Not on file  Physical Activity: Not on file  Stress: Not on file  Social Connections: Not on file     Family History: The patient's family history is not on file.  ROS:   Please see the history of present illness.     EKGs/Labs/Other Studies Reviewed:    The following studies were reviewed today:  Echocardiogram 03/29/2020: Impressions: 1. Left ventricular ejection  fraction, by estimation, is 45 to 50%. The  left ventricle has mildly decreased function. The left ventricle  demonstrates global hypokinesis. There is mild left ventricular  hypertrophy. Left ventricular diastolic parameters  are consistent with Grade II diastolic dysfunction (pseudonormalization).  Elevated left atrial pressure.  2. Right ventricular systolic function is normal. The right ventricular  size is normal. There is normal pulmonary artery systolic pressure. The  estimated right ventricular systolic pressure is 32.4 mmHg.  3. The mitral valve is abnormal. Moderate mitral valve regurgitation.  Moderate mitral annular calcification.  4. The aortic valve is tricuspid. There is moderate thickening of the  aortic valve. Aortic  valve regurgitation is mild to moderate. Moderate  aortic valve stenosis. Vmax 3.4 m/s, MG 24 mmHg, AVA 0.9 cm^2, DI 0.34  5. The inferior vena cava is normal in size with greater than 50%  respiratory variability, suggesting right atrial pressure of 3 mmHg.  _______________  Cardiac Catheterization 03/30/2020:  Prox RCA to Mid RCA lesion is 40% stenosed.  Ost Cx lesion is 99% stenosed.  Dist LM to Ost LAD lesion is 60% stenosed.   1. Patent left main stent with distal moderate restenosis. The stent extends into the LAD. There is haziness in the ostium of the LAD suggesting at least moderate restenosis of the stent. Another stent extends from the left main into the Circumflex. Severe ostial Circumflex restenosis. Good targets for bypass in the obtuse marginal branches and mid LAD.   Recommendations: She has restenosis of the distal left main stented segment, ostial Circumflex stented segment and ostial LAD stented segment. She has diabetes and good targets for bypass. While balloon angioplasty of the severely restenotic segments is possible, there would not be a good long term result with high chance of recurrent restenosis. I have reviewed her images  with Dr. Antoine Poche. I think the best option is to consider bypass surgery. She is relatively young and a diabetic. Will ask CT surgery to see her. Also note that she has received Plavix today and has ongoing issues with anemia/GI bleeding. Plavix will be held going forward in anticipation of bypass surgery.  _______________  Pre-CABG Dopplers 04/01/2020: Summary: - Right Carotid: Velocities in the right ICA are consistent with a 40-59% stenosis.  - Left Carotid: Velocities in the left ICA are consistent with a less than 50% stenosis.  - Vertebrals: Bilateral vertebral arteries demonstrate antegrade flow.  - Right Upper Extremity: Doppler waveform obliterate with right radial compression. Doppler waveform obliterate with right ulnar compression.  - Left Upper Extremity: Doppler waveform obliterate with left radial compression. Doppler waveform obliterate with left ulnar compression.   EKG:  EKG ordered today. EKG personally reviewed and demonstrates normal sinus rhythm, rate 70 bpm, with non-specific ST/T changes. No significant changes from prior tracings.   Recent Labs: 03/29/2020: ALT 12 04/04/2020: Magnesium 2.5 04/11/2020: BUN 13; Creatinine, Ser 0.65; Hemoglobin 9.3; Platelets 311; Potassium 3.9; Sodium 140  Recent Lipid Panel No results found for: CHOL, TRIG, HDL, CHOLHDL, VLDL, LDLCALC, LDLDIRECT  Physical Exam:    Vital Signs: BP 134/86   Pulse 70   Ht 5' (1.524 m)   Wt 220 lb 12.8 oz (100.2 kg)   LMP  (LMP Unknown)   SpO2 98%   BMI 43.12 kg/m     Wt Readings from Last 3 Encounters:  05/01/20 220 lb 12.8 oz (100.2 kg)  04/13/20 223 lb (101.2 kg)  04/11/20 226 lb 13.7 oz (102.9 kg)     General: 65 y.o. female in no acute distress. HEENT: Normocephalic and atraumatic. Sclera clear.  Neck: Supple. Bilateral carotid bruits. No JVD. Heart: RRR. Distinct S1 and S2. No murmurs, gallops, or rubs. Sternotomy scar healing well.  Lungs: No increased work of breathing. Decreased/absent  breath sounds in left base. No crackles. Abdomen: Soft, non-distended, and non-tender to palpation. Extremities: No significant lower extremity edema. Well healed scar from SVG on right upper leg with sutures still in place and skin growing over sutures. Skin: Warm and dry. Neuro: Alert and oriented x3. No focal deficits. Psych: Normal affect. Responds appropriately.  Assessment:    1. Coronary artery disease involving native coronary artery  of native heart without angina pectoris   2. S/P CABG x 2   3. Aortic valve stenosis, etiology of cardiac valve disease unspecified   4. S/P AVR   5. Post-op atrial fibrillation (HCC)   6. Chronic combined systolic and diastolic CHF (congestive heart failure) (HCC)   7. Ischemic cardiomyopathy   8. Dyspnea on exertion   9. Bilateral carotid artery stenosis   10. PAD (peripheral artery disease) (HCC)   11. Primary hypertension   12. Type 2 diabetes mellitus with complication, with long-term current use of insulin (HCC)     Plan:    CAD s/p Recent CABG - History of stenting to left main, ostial LAD, and ostial CX. S/p recent CABG x2  (LIMA to LAD and SVG to OM1) on 04/03/2020 with Dr. Cliffton Asters. - Patient has chest wall soreness and some occasional sharp pain on left side but no recurrent chest pain like she was having prior to admission. Nothing that sounds like angina. - Continue Aspirin 325mg  daily, Lopressor 37.5mg  twice daily, and Lipitor 80mg  daily. Will defer duration of high dose Aspirin to CT surgery.  At my request, my colleague , NP, helped removed sutures from SVG graft site on right upper leg since skin was starting to grow over sutures. Patient tolerated this well. Only minimally bleeding. Advised patient to keep this clean and dry. Advised patient to reach out to Dr. office if site becomes red, warm to the touch, or looks infected.  Aortic Stenosis s/p AVR  - S/p AVR at time of CABG.  - Post-op Echo scheduled  for 05/23/2020. - Followed by Dr. Lucilla Lame. - Will need SBE prophylaxis going forward.  Post-Op Atrial Fibrillation - Developed post-op atrial fibrillation following CABG/AVR. Converted back to sinus rhythm with Digoxin and increased in beta-blocker. Was not started on anticoagulation. - Maintaining sinus rhythm.  - Continue Digoxin 0.125mg  daily.  - Continue Lopressor 37.5mg  twice daily. - Patient has already taken Digoxin today so will have her come back to recheck Digoxin level.  Chronic Systolic CHF/Ischemic Cardiomyopathy - Echo during recent admission showed LVEF of 45-50% with global hypokinesis and grade 2 diastolic dysfunction. - Patient has decreased breath sounds in left base but otherwise does not appear significantly volume overloaded on exam.  - Patient no longer taking Lasix (finished 5 day course after discharge). Will hold off on additional diuresis for now. - Continue Lopressor 37.5mg  twice daily.  - If EF remains slightly reduced on repeat Echo next month, consider transitioning to Toprol and adding low dose ARB. - Will check BMET today.  Dyspnea Pleural Effusion - Patient notes dyspnea on exertion and is unsure if this is worse from baseline. - Last chest x-ray during admission showed questionable tiny pneumothorax at left lung base and stable opacity at the left lung base that was felt to likely represent atelectasis and/or small pleural effusion. - Patient has absent breath sounds in left base today.  - Will repeat chest x-ray. If pleural effusion has increased, will restart Lasix for 3-5 days.  Bilateral Carotid Artery Stenosis - S/p stenting to left ICA.  - Recent carotid dopplers prior to CABG showed 40-59% stenosis of right ICA and <50% stenosis of left ICA. - Continue aspirin and statin.  PAD - S/p stenting to left common iliac artery in 2019. - Continue aspirin and statin.  Hypertension - BP well controlled. - Continue Lopressor 37.5mg  twice  daily.  Type 2 Diabetes - Management per PCP.   Chronic Anemia  with History of GI Bleed - History of GI bleed secondary to AVM.  - Hemoglobin 9.3 on discharge from hospital. - Will recheck CBC today.  Disposition: Follow up in 2-3 months with Dr. Antoine Poche.   Medication Adjustments/Labs and Tests Ordered: Current medicines are reviewed at length with the patient today.  Concerns regarding medicines are outlined above.  Orders Placed This Encounter  Procedures  . DG Chest 2 View  . CBC  . Basic metabolic panel  . Digoxin level  . EKG 12-Lead   No orders of the defined types were placed in this encounter.   Patient Instructions  Medication Instructions:  No changes *If you need a refill on your cardiac medications before your next appointment, please call your pharmacy*   Lab Work: [facility] Today. Digoxin Level (2-3 weeks) Do Not Take Digoxin Day of Blood Draw for Digoxin Level If you have labs (blood work) drawn today and your tests are completely normal, you will receive your results only by: Marland Kitchen MyChart Message (if you have MyChart) OR . A paper copy in the mail If you have any lab test that is abnormal or we need to change your treatment, we will call you to review the results.   Testing/Procedures: Chest X-Ray [facility] Imaging.   Follow-Up: At [facility], you and your health needs are our priority.  As part of our continuing mission to provide you with exceptional heart care, we have created designated Provider Care Teams.  These Care Teams include your primary Cardiologist (physician) and Advanced Practice Providers (APPs -  Physician Assistants and Nurse Practitioners) who all work together to provide you with the care you need, when you need it.  Your next appointment:   3-4 Months  The format for your next appointment:   In Person  Provider:   Rollene Rotunda, MD         Signed, Corrin Parker, PA-C  05/01/2020 2:06 PM    [facility]

## 2020-05-01 ENCOUNTER — Ambulatory Visit (INDEPENDENT_AMBULATORY_CARE_PROVIDER_SITE_OTHER): Payer: Medicaid Other | Admitting: Student

## 2020-05-01 ENCOUNTER — Encounter: Payer: Self-pay | Admitting: Student

## 2020-05-01 ENCOUNTER — Other Ambulatory Visit: Payer: Self-pay

## 2020-05-01 ENCOUNTER — Ambulatory Visit
Admission: RE | Admit: 2020-05-01 | Discharge: 2020-05-01 | Disposition: A | Discharge status: Home / Self Care | Payer: Medicaid Other | Source: Ambulatory Visit | Attending: Student | Admitting: Student

## 2020-05-01 VITALS — BP 134/86 | HR 70 | Ht 60.0 in | Wt 220.8 lb

## 2020-05-01 DIAGNOSIS — I5042 Chronic combined systolic (congestive) and diastolic (congestive) heart failure: Secondary | ICD-10-CM

## 2020-05-01 DIAGNOSIS — I480 Paroxysmal atrial fibrillation: Secondary | ICD-10-CM

## 2020-05-01 DIAGNOSIS — R0600 Dyspnea, unspecified: Secondary | ICD-10-CM

## 2020-05-01 DIAGNOSIS — I739 Peripheral vascular disease, unspecified: Secondary | ICD-10-CM

## 2020-05-01 DIAGNOSIS — Z951 Presence of aortocoronary bypass graft: Secondary | ICD-10-CM

## 2020-05-01 DIAGNOSIS — Z794 Long term (current) use of insulin: Secondary | ICD-10-CM

## 2020-05-01 DIAGNOSIS — I350 Nonrheumatic aortic (valve) stenosis: Secondary | ICD-10-CM | POA: Diagnosis not present

## 2020-05-01 DIAGNOSIS — I2510 Atherosclerotic heart disease of native coronary artery without angina pectoris: Secondary | ICD-10-CM | POA: Diagnosis not present

## 2020-05-01 DIAGNOSIS — E118 Type 2 diabetes mellitus with unspecified complications: Secondary | ICD-10-CM

## 2020-05-01 DIAGNOSIS — I6523 Occlusion and stenosis of bilateral carotid arteries: Secondary | ICD-10-CM

## 2020-05-01 DIAGNOSIS — Z952 Presence of prosthetic heart valve: Secondary | ICD-10-CM

## 2020-05-01 DIAGNOSIS — I10 Essential (primary) hypertension: Secondary | ICD-10-CM

## 2020-05-01 DIAGNOSIS — I255 Ischemic cardiomyopathy: Secondary | ICD-10-CM

## 2020-05-01 DIAGNOSIS — R0609 Other forms of dyspnea: Secondary | ICD-10-CM

## 2020-05-01 NOTE — Patient Instructions (Addendum)
Medication Instructions:  No changes *If you need a refill on your cardiac medications before your next appointment, please call your pharmacy*   Lab Work: [facility] Today. Digoxin Level (2-3 weeks) Do Not Take Digoxin Day of Blood Draw for Digoxin Level If you have labs (blood work) drawn today and your tests are completely normal, you will receive your results only by:  MyChart Message (if you have MyChart) OR  A paper copy in the mail If you have any lab test that is abnormal or we need to change your treatment, we will call you to review the results.   Testing/Procedures: Chest X-Ray [facility] Imaging.   Follow-Up: At [facility], you and your health needs are our priority.  As part of our continuing mission to provide you with exceptional heart care, we have created designated Provider Care Teams.  These Care Teams include your primary Cardiologist (physician) and Advanced Practice Providers (APPs -  Physician Assistants and Nurse Practitioners) who all work together to provide you with the care you need, when you need it.  Your next appointment:   3-4 Months  The format for your next appointment:   In Person  Provider:   Rollene Rotunda, MD

## 2020-05-02 ENCOUNTER — Telehealth: Payer: Self-pay | Admitting: Internal Medicine

## 2020-05-02 ENCOUNTER — Telehealth: Payer: Self-pay | Admitting: Student

## 2020-05-02 LAB — BASIC METABOLIC PANEL
BUN/Creatinine Ratio: 20 (ref 12–28)
BUN: 12 mg/dL (ref 8–27)
CO2: 23 mmol/L (ref 20–29)
Calcium: 9.2 mg/dL (ref 8.7–10.3)
Chloride: 107 mmol/L — ABNORMAL HIGH (ref 96–106)
Creatinine, Ser: 0.6 mg/dL (ref 0.57–1.00)
GFR calc Af Amer: 111 mL/min/{1.73_m2} (ref >59)
GFR calc non Af Amer: 97 mL/min/{1.73_m2} (ref >59)
Glucose: 57 mg/dL — ABNORMAL LOW (ref 65–99)
Potassium: 4.1 mmol/L (ref 3.5–5.2)
Sodium: 145 mmol/L — ABNORMAL HIGH (ref 134–144)

## 2020-05-02 LAB — CBC
Hematocrit: 37.6 % (ref 34.0–46.6)
Hemoglobin: 11.7 g/dL (ref 11.1–15.9)
MCH: 26.8 pg (ref 26.6–33.0)
MCHC: 31.1 g/dL — ABNORMAL LOW (ref 31.5–35.7)
MCV: 86 fL (ref 79–97)
Platelets: 249 10*3/uL (ref 150–450)
RBC: 4.37 x10E6/uL (ref 3.77–5.28)
RDW: 14.5 % (ref 11.7–15.4)
WBC: 8.2 10*3/uL (ref 3.4–10.8)

## 2020-05-02 NOTE — Telephone Encounter (Signed)
Patient returning call for lab results. 

## 2020-05-02 NOTE — Telephone Encounter (Signed)
   Telephone encounter was:  Successful.  05/02/2020 Name: [name] MRN: [id number] DOB: 12-13-1955  Heidi Morse is a 64 y.o. year old female who is a primary care patient of Leola Brazil, DO . The community resource team was consulted for assistance with in home care.   Care guide performed the following interventions: Follow up call placed to the patient to discuss status of referral [facility] was able to pick patient up as a client with her insurance and will be providing her with in home care. .  Follow Up Plan:  No further follow up planned at this time. The patient has been provided with needed resources.  Rojelio Brenner Care Guide, Embedded Care Coordination [facility], Care Management Phone: [phone] Email: [email or web address]

## 2020-05-03 ENCOUNTER — Other Ambulatory Visit: Payer: Self-pay | Admitting: Physician Assistant

## 2020-05-05 ENCOUNTER — Telehealth: Payer: Self-pay

## 2020-05-05 NOTE — Telephone Encounter (Signed)
[place], [place] [phone] with [facility] contacted the office requesting verbal orders for home health Physical Therapy one time a week for four weeks, and PT evaluation.  Patient is s/p CABG x2 with Dr. Cliffton Asters 04/03/20.  Verbal orders given. Will await faxed copy for physician signature.

## 2020-05-09 ENCOUNTER — Other Ambulatory Visit: Payer: Self-pay

## 2020-05-09 ENCOUNTER — Other Ambulatory Visit: Payer: Self-pay | Admitting: Student

## 2020-05-09 DIAGNOSIS — Z79899 Other long term (current) drug therapy: Secondary | ICD-10-CM

## 2020-05-09 DIAGNOSIS — I5042 Chronic combined systolic (congestive) and diastolic (congestive) heart failure: Secondary | ICD-10-CM

## 2020-05-09 MED ORDER — POTASSIUM CHLORIDE CRYS ER 20 MEQ PO TBCR: 20.0000 meq | EXTENDED_RELEASE_TABLET | Freq: Every day | ORAL | 0 refills | Status: DC

## 2020-05-09 MED ORDER — FUROSEMIDE 40 MG PO TABS: 40.0000 mg | ORAL_TABLET | Freq: Every day | ORAL | 0 refills | Status: DC

## 2020-05-18 ENCOUNTER — Other Ambulatory Visit: Payer: Self-pay | Admitting: Thoracic Surgery (Cardiothoracic Vascular Surgery)

## 2020-05-18 DIAGNOSIS — Z951 Presence of aortocoronary bypass graft: Secondary | ICD-10-CM

## 2020-05-19 ENCOUNTER — Ambulatory Visit (INDEPENDENT_AMBULATORY_CARE_PROVIDER_SITE_OTHER): Payer: Self-pay | Admitting: Thoracic Surgery (Cardiothoracic Vascular Surgery)

## 2020-05-19 ENCOUNTER — Other Ambulatory Visit: Payer: Self-pay

## 2020-05-19 ENCOUNTER — Ambulatory Visit
Admission: RE | Admit: 2020-05-19 | Discharge: 2020-05-19 | Disposition: A | Discharge status: Home / Self Care | Payer: Medicaid Other | Source: Ambulatory Visit | Attending: Thoracic Surgery (Cardiothoracic Vascular Surgery) | Admitting: Thoracic Surgery (Cardiothoracic Vascular Surgery)

## 2020-05-19 ENCOUNTER — Encounter: Payer: Self-pay | Admitting: Thoracic Surgery (Cardiothoracic Vascular Surgery)

## 2020-05-19 VITALS — BP 171/90 | HR 80 | Temp 97.6°F | Resp 20 | Ht 60.0 in | Wt 222.0 lb

## 2020-05-19 DIAGNOSIS — Z953 Presence of xenogenic heart valve: Secondary | ICD-10-CM

## 2020-05-19 DIAGNOSIS — Z951 Presence of aortocoronary bypass graft: Secondary | ICD-10-CM

## 2020-05-19 NOTE — Progress Notes (Signed)
      [address]       [place] [address]             [phone]        Hallel Denherder [facility] Medical Record #[id number] Date of Birth: 08-27-1955  Referring: Kathleene Hazel* Primary Care: Leola Brazil, DO Primary Cardiologist:James Antoine Poche, MD  Reason for visit:   follow-up  History of Present Illness:     Ms. Nimmons presents for 1 month follow-up appointment.  She states that she is doing much better with significantly.  She only complains of some left parasternal musculoskeletal pain.  Physical Exam: BP (!) 171/90   Pulse 80   Temp 97.6 F (36.4 C) (Skin)   Resp 20   Ht 5' (1.524 m)   Wt 222 lb (100.7 kg)   SpO2 94% Comment: RA  BMI 43.36 kg/m   Alert NAD Incision clean.  Sternum stable Abdomen soft, ND Trace peripheral edema   Diagnostic Studies & Laboratory data: CXR: Small left effusion     Assessment / Plan:   65 year old female status post AVR CABG with aortic root enlargement. Overall doing well  Cleared for cardiac rehab. We will continue to follow-up with cardiology.   Corliss Skains 05/19/2020 12:32 PM

## 2020-05-20 ENCOUNTER — Other Ambulatory Visit: Payer: Self-pay | Admitting: Physician Assistant

## 2020-05-23 ENCOUNTER — Other Ambulatory Visit: Payer: Self-pay

## 2020-05-23 ENCOUNTER — Ambulatory Visit (HOSPITAL_COMMUNITY): Payer: Medicaid Other | Attending: Internal Medicine

## 2020-05-23 DIAGNOSIS — I350 Nonrheumatic aortic (valve) stenosis: Secondary | ICD-10-CM | POA: Diagnosis present

## 2020-05-23 DIAGNOSIS — Z953 Presence of xenogenic heart valve: Secondary | ICD-10-CM | POA: Insufficient documentation

## 2020-05-23 LAB — ECHOCARDIOGRAM COMPLETE
AR max vel: 1.14 cm2
AV Area VTI: 1.09 cm2
AV Area mean vel: 1.2 cm2
AV Mean grad: 20 mmHg
AV Peak grad: 29.6 mmHg
Ao pk vel: 2.72 m/s
Area-P 1/2: 3.03 cm2
S' Lateral: 3.6 cm

## 2020-05-24 ENCOUNTER — Other Ambulatory Visit: Payer: Self-pay | Admitting: *Deleted

## 2020-05-24 ENCOUNTER — Other Ambulatory Visit: Payer: Self-pay

## 2020-05-24 NOTE — Patient Outreach (Signed)
Care Coordination  05/24/2020  Heidi Morse [birth year] [id number]    Medicaid Managed Care   Unsuccessful Outreach Note  05/24/2020 Name: Heidi Morse MRN: [id number] DOB: Oct 26, 1955  Referred by: Leola Brazil, DO Reason for referral : High Risk Managed Medicaid (Unsuccessful RNCM follow up outreach)   An unsuccessful telephone outreach was attempted today. The patient was referred to the case management team for assistance with care management and care coordination.   Follow Up Plan: A HIPAA compliant phone message was left for the patient providing contact information and requesting a return call.  The care management team will reach out to the patient again over the next 7-14 days.   Estanislado Emms RN, BSN [place]  [employer]

## 2020-05-24 NOTE — Patient Instructions (Signed)
Visit Information  Ms. Lora Chavers  - as a part of your Medicaid benefit, you are eligible for care management and care coordination services at no cost or copay. I was unable to reach you by phone today but would be happy to help you with your health related needs. Please feel free to call me @ [phone].   A member of the Managed Medicaid care management team will reach out to you again over the next 7-14 days.   Estanislado Emms RN, BSN [place]  [employer]

## 2020-05-26 ENCOUNTER — Telehealth: Payer: Self-pay

## 2020-05-26 NOTE — Telephone Encounter (Signed)
Spoke with patient, see result note

## 2020-05-26 NOTE — Telephone Encounter (Signed)
Left message for patient to call back for echo results. Patient has a 3 month follow up.  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 I like to see her back in 3 months in the office."

## 2020-05-26 NOTE — Telephone Encounter (Signed)
Patient is returning call.  °

## 2020-05-28 ENCOUNTER — Other Ambulatory Visit: Payer: Self-pay | Admitting: Physician Assistant

## 2020-05-29 ENCOUNTER — Telehealth: Payer: Self-pay | Admitting: Student

## 2020-05-29 MED ORDER — DIGOXIN 125 MCG PO TABS: 0.1250 mg | ORAL_TABLET | Freq: Every day | ORAL | 5 refills | Status: DC

## 2020-05-29 NOTE — Telephone Encounter (Signed)
*  STAT* If patient is at the pharmacy, call can be transferred to refill team.   1. Which medications need to be refilled? (please list name of each medication and dose if known) Digoxin  2. Which pharmacy/location (including street and city if local pharmacy) is medication to be sent to? [facility] [address] [place]  3. Do they need a 30 day or 90 day supply? 30 days and refills

## 2020-05-29 NOTE — Telephone Encounter (Signed)
Let patient know that refills has been sent to the pharmacy. 

## 2020-05-30 ENCOUNTER — Telehealth: Payer: Self-pay | Admitting: Cardiology

## 2020-05-30 ENCOUNTER — Other Ambulatory Visit: Payer: Self-pay | Admitting: Student

## 2020-05-30 NOTE — Telephone Encounter (Signed)
Spoke with pt, questions regarding recent echo answered. Paperwork for dig level mailed to the patient.

## 2020-05-30 NOTE — Telephone Encounter (Signed)
Patient states that when she got the results of her echo, she was told there was a narrowing in her heart. She wants to know what this means and what causes it. Please advise.

## 2020-05-30 NOTE — Telephone Encounter (Signed)
This is Dr. Hochrein's pt. °

## 2020-05-31 ENCOUNTER — Other Ambulatory Visit: Payer: Self-pay | Admitting: Student

## 2020-06-01 ENCOUNTER — Other Ambulatory Visit: Payer: Self-pay

## 2020-06-06 ENCOUNTER — Telehealth: Payer: Self-pay | Admitting: Internal Medicine

## 2020-06-06 LAB — DIGOXIN LEVEL: Digoxin, Serum: <0.4 ng/mL — ABNORMAL LOW (ref 0.5–0.9)

## 2020-06-06 NOTE — Telephone Encounter (Signed)
Left message for [name] to please call me to reschedule her phone visit with [facility] Estanislado Emms.

## 2020-06-08 ENCOUNTER — Telehealth: Payer: Self-pay

## 2020-06-08 ENCOUNTER — Other Ambulatory Visit: Payer: Self-pay | Admitting: Physician Assistant

## 2020-06-08 ENCOUNTER — Other Ambulatory Visit: Payer: Self-pay

## 2020-06-08 MED ORDER — METOPROLOL TARTRATE 25 MG PO TABS: 37.5000 mg | ORAL_TABLET | Freq: Two times a day (BID) | ORAL | 1 refills | Status: DC

## 2020-06-08 MED ORDER — METOPROLOL TARTRATE 25 MG PO TABS: 37.5000 mg | ORAL_TABLET | Freq: Two times a day (BID) | ORAL | 3 refills | Status: DC

## 2020-06-08 NOTE — Addendum Note (Signed)
Addended by: Burnetta Sabin on: 06/08/2020 12:40 PM   Modules accepted: Orders

## 2020-06-08 NOTE — Telephone Encounter (Signed)
Patient contacted the office requesting refill of metoprolol.  Patient was advised to contact her Cardiologist office, Dr. Antoine Poche.

## 2020-06-09 ENCOUNTER — Other Ambulatory Visit: Payer: Self-pay | Admitting: *Deleted

## 2020-06-09 ENCOUNTER — Other Ambulatory Visit: Payer: Self-pay

## 2020-06-09 NOTE — Patient Outreach (Signed)
Care Coordination  06/09/2020  Mannat Benedetti [birth year] [id number]   Successful outreach today with Ms. Dinges, however, she was busy working around the house today. She thought her visit was scheduled for 06/08/20 and she could not talk at this time. RNCM scheduled a new appointment on 06/14/20 @ 10:30am and will call back at this time.  Estanislado Emms RN, BSN [place]  [employer]

## 2020-06-14 ENCOUNTER — Ambulatory Visit: Payer: Medicaid Other

## 2020-06-28 ENCOUNTER — Telehealth: Payer: Self-pay | Admitting: Cardiology

## 2020-06-28 NOTE — Telephone Encounter (Signed)
Pt c/o of Chest Pain: STAT if CP now or developed within 24 hours  1. Are you having CP right now? no  2. Are you experiencing any other symptoms (ex. SOB, nausea, vomiting, sweating)? no  3. How long have you been experiencing CP? yesterday  4. Is your CP continuous or coming and going? Coming and goine  5. Have you taken Nitroglycerin? no ? Patient states her Dr saw little bit of fluid in her left lung.

## 2020-06-28 NOTE — Telephone Encounter (Signed)
Patient was calling about her results. Please call back

## 2020-06-28 NOTE — Telephone Encounter (Signed)
Agree 

## 2020-06-28 NOTE — Telephone Encounter (Signed)
Spoke with patient and she has been having increased shortness of breath this week Denies any weight gain or swelling  She is not taking Lasix or potassium on daily basis, was d/c at discharge from hospital  Patient scheduled for visit Friday 4/8, can only come on Friday's  Advised patient to take Lasix and potassium today and tomorrow and will forward to Dr Antoine Poche for review

## 2020-06-28 NOTE — Telephone Encounter (Signed)
Left message for patient to call back  

## 2020-06-28 NOTE — Telephone Encounter (Signed)
See previous phone note. Patient has spoken to [employer]

## 2020-06-28 NOTE — Telephone Encounter (Signed)
Patient returning call.

## 2020-06-30 NOTE — Telephone Encounter (Signed)
Patient had to reschedule her visit for 4/29 secondary to transportation issues She has taken her Lasix and K+ for 2 days and thinks there is some improvement in shortness of breath Will forward to Dr Antoine Poche for recommendations on medications

## 2020-06-30 NOTE — Telephone Encounter (Signed)
Keep 4/29 appt

## 2020-06-30 NOTE — Telephone Encounter (Signed)
Per Dr Antoine Poche no changes in medications if patient can not have bmet next week Discussed with patient and she can not have bmet secondary to transportation  Advised to call back if any changes

## 2020-07-07 ENCOUNTER — Ambulatory Visit: Payer: Medicaid Other | Admitting: Cardiology

## 2020-07-10 ENCOUNTER — Telehealth: Payer: Self-pay | Admitting: Cardiology

## 2020-07-10 NOTE — Telephone Encounter (Signed)
Pt c/o Shortness Of Breath: STAT if SOB developed within the last 24 hours or pt is noticeably SOB on the phone  1. Are you currently SOB (can you hear that pt is SOB on the phone)? No   2. How long have you been experiencing SOB? Pt went to [facility]. She stated she was winded just walking to from the car  3. Are you SOB when sitting or when up moving around? Up moving around a lot   4. Are you currently experiencing any other symptoms? Pt denies any other symptoms.    She has an appt on the 29th but she feels as if she needs to be seen soon.  Nothing available before then   Best number  [phone]

## 2020-07-10 NOTE — Telephone Encounter (Signed)
Spoke with patient and she thinks shortness of breath getting worse  Scheduled her appointment tomorrow with Youlanda Mighty NP

## 2020-07-11 ENCOUNTER — Ambulatory Visit (INDEPENDENT_AMBULATORY_CARE_PROVIDER_SITE_OTHER): Payer: Medicaid Other | Admitting: General Practice

## 2020-07-11 ENCOUNTER — Encounter: Payer: Self-pay | Admitting: General Practice

## 2020-07-11 ENCOUNTER — Other Ambulatory Visit: Payer: Self-pay

## 2020-07-11 VITALS — BP 130/72 | HR 64 | Ht 60.0 in | Wt 221.0 lb

## 2020-07-11 DIAGNOSIS — I2510 Atherosclerotic heart disease of native coronary artery without angina pectoris: Secondary | ICD-10-CM | POA: Diagnosis not present

## 2020-07-11 DIAGNOSIS — I5042 Chronic combined systolic (congestive) and diastolic (congestive) heart failure: Secondary | ICD-10-CM

## 2020-07-11 DIAGNOSIS — I255 Ischemic cardiomyopathy: Secondary | ICD-10-CM

## 2020-07-11 DIAGNOSIS — I10 Essential (primary) hypertension: Secondary | ICD-10-CM

## 2020-07-11 DIAGNOSIS — R0602 Shortness of breath: Secondary | ICD-10-CM

## 2020-07-11 DIAGNOSIS — D508 Other iron deficiency anemias: Secondary | ICD-10-CM | POA: Diagnosis not present

## 2020-07-11 DIAGNOSIS — Z952 Presence of prosthetic heart valve: Secondary | ICD-10-CM

## 2020-07-11 DIAGNOSIS — I480 Paroxysmal atrial fibrillation: Secondary | ICD-10-CM

## 2020-07-11 DIAGNOSIS — Z79899 Other long term (current) drug therapy: Secondary | ICD-10-CM

## 2020-07-11 LAB — BASIC METABOLIC PANEL
BUN/Creatinine Ratio: 23 (ref 12–28)
BUN: 16 mg/dL (ref 8–27)
CO2: 20 mmol/L (ref 20–29)
Calcium: 9 mg/dL (ref 8.7–10.3)
Chloride: 104 mmol/L (ref 96–106)
Creatinine, Ser: 0.69 mg/dL (ref 0.57–1.00)
Glucose: 134 mg/dL — ABNORMAL HIGH (ref 65–99)
Potassium: 4.2 mmol/L (ref 3.5–5.2)
Sodium: 142 mmol/L (ref 134–144)
eGFR: 97 mL/min/{1.73_m2} (ref >59)

## 2020-07-11 LAB — CBC
Hematocrit: 36.1 % (ref 34.0–46.6)
Hemoglobin: 11 g/dL — ABNORMAL LOW (ref 11.1–15.9)
MCH: 25.8 pg — ABNORMAL LOW (ref 26.6–33.0)
MCHC: 30.5 g/dL — ABNORMAL LOW (ref 31.5–35.7)
MCV: 85 fL (ref 79–97)
Platelets: 194 10*3/uL (ref 150–450)
RBC: 4.27 x10E6/uL (ref 3.77–5.28)
RDW: 15.1 % (ref 11.7–15.4)
WBC: 8.7 10*3/uL (ref 3.4–10.8)

## 2020-07-11 NOTE — Progress Notes (Signed)
Cardiology Clinic Note   Patient Name: Heidi Morse Date of Encounter: 07/11/2020  Primary Care Provider:  Leola Brazil, DO Primary Cardiologist:  Heidi Rotunda, MD  Patient Profile    Heidi Morse 65 year old female presents the clinic today for evaluation of shortness of breath.  Past Medical History    Past Medical History:  Diagnosis Date  . Asthma   . Bilateral carotid artery stenosis   . CAD (coronary artery disease)    s/p CABG x2 in 04/2020  . CHF (congestive heart failure) (HCC)   . COPD (chronic obstructive pulmonary disease) (HCC)   . Diabetes mellitus without complication (HCC)   . Hypertension   . MI (myocardial infarction) (HCC)   . PAD (peripheral artery disease) (HCC)   . Sleep apnea    Past Surgical History:  Procedure Laterality Date  . AORTIC ROOT ENLARGEMENT N/A 04/03/2020   Procedure: AORTIC ROOT ENLARGEMENT USING HEMASHIELD PLATINUM WOVEN DOUBLE VELOUR VASCULAR GRAFT 28 MM X 30 CM;  Surgeon: Corliss Skains, MD;  Location: [facility] OR;  Service: Open Heart Surgery;  Laterality: N/A;  . AORTIC VALVE REPLACEMENT N/A 04/03/2020   Procedure: CORONARY ARTERY BYPASS GRAFTING X 2 ON CARDIOPULMONARY BYPASS. AORTIC VALVE REPLACEMENT USING 21 MM INSPIRIS RESILIA AORTIC VALVE, STERNAL PLATING;  Surgeon: Corliss Skains, MD;  Location: [facility] OR;  Service: Open Heart Surgery;  Laterality: N/A;  Coronary artery bypass grafting  Flow Trac  . ENDOVEIN HARVEST OF GREATER SAPHENOUS VEIN Right 04/03/2020   Procedure: ENDOVEIN HARVEST OF GREATER SAPHENOUS VEIN;  Surgeon: Corliss Skains, MD;  Location: [facility] OR;  Service: Open Heart Surgery;  Laterality: Right;  . LEFT HEART CATH AND CORONARY ANGIOGRAPHY N/A 03/30/2020   Procedure: LEFT HEART CATH AND CORONARY ANGIOGRAPHY;  Surgeon: Kathleene Hazel, MD;  Location: [facility] INVASIVE CV LAB;  Service: Cardiovascular;  Laterality: N/A;  . TEE WITHOUT CARDIOVERSION N/A 04/03/2020   Procedure: TRANSESOPHAGEAL  ECHOCARDIOGRAM (TEE);  Surgeon: Corliss Skains, MD;  Location: [facility] OR;  Service: Open Heart Surgery;  Laterality: N/A;    Allergies  Allergies  Allergen Reactions  . Peanut Oil Swelling and Hives  . Peanut-Containing Drug Products Hives and Swelling  . Povidone-Iodine Other (See Comments)    unknown Unknown. PT STATES SHE HEARD HER DERMATOLOGIST MENTION THEY WERE GLAD THEY DID NOT USE THIS DURING A PROCEDURE, PT STATES SHE IS REALLY NOT SURE WHY.   . Shellfish Allergy Other (See Comments)    unknown  . Statins Other (See Comments)    myalgia  . Iodine Itching and Rash    unk reaction per pt    History of Present Illness    Heidi Morse has a PMH of coronary artery disease status post PCI of left main, ostial LAD, ostial circumflex and CABG x2 (LIMA-LAD, SVG-OM1) on 04/03/2020.  Her history also includes aortic stenosis status post AVR, CABG, postop atrial fibrillation, ischemic cardiomyopathy with chronic systolic CHF and EF of 45-50%, PAD status post stenting of left common iliac artery 9/19, bilateral carotid artery stenosis status post stenting of the left ICA, COPD/asthma, OSA, HTN, type 2 diabetes, GI bleeding which required transfusion, obesity, and chronic pain on narcotics.  She was previously followed by [facility] cardiac care.  She had multiple cardiac catheterizations.  From 6/21 June 2019 she underwent 5 cardiac catheterizations and was treated with stenting of her left main, ostial stent, ostial circumflex.  She was seen by cardiothoracic surgery on 2 occasions but it was felt that  her CAD could be better treated with PCI.  She had chronic chest pain which was felt to be noncardiac in nature.  She had significant PAD of carotid arteries and lower extremities.  She underwent stenting to her left internal carotid artery as well as her left common iliac artery.  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  She was admitted 03/23/2020 until  03/24/2020 at [facility] for anemia and GI bleed.  She also had chest pain at that time.  Her troponins were negative and EKG showed no ischemic changes.  Her imaging was negative for active GI bleed.  She was discharged on Plavix and aspirin.  She returned to [facility] ED 03/27/2020 for persistent chest pain which was unrelieved by nitroglycerin.  Her work-up was unremarkable.  She was again felt to be having noncardiac type chest discomfort.  She was discharged in stable condition.  She was frustrated with her persistent pain so she transferred care to [facility] on 03/28/2020 for second pain in addition for evaluation.  On presentation her high-sensitivity troponins were negative and her hemoglobin was 7.6.  She received 1 unit of PRBCs and GI was consulted.  There was no active bleed and GI did not feel intervention was needed.  Given significant cardiac history cardiology was consulted and decision was made for cardiac catheterization.  She underwent left heart cath which showed patent left main stent with distal moderate restenosis.  Haziness was noted in the ostium of the LAD suggesting moderate restenosis of the stent.  She was also noted to have severe restenosis of ostial circumflex stent.  Her echocardiogram showed an LVEF of 45-50% with mild LVH, G2 DD, mild-moderate AI, moderate AS, and moderate MR.  CT surgery was consulted and she ended up with CABG x2 LIMA-LAD and SVG-OM1 as well as AVR on 04/03/2020 by Dr. Cliffton Asters.  She developed postop atrial fibrillation with RVR on 04/09/2020.  Due to her iodine allergy she was unable to start amiodarone.  She was started on digoxin and beta-blocker was increased.  She converted back to sinus rhythm.  She was not started on anticoagulation.  She was started on Lasix postop for fluid volume overload.  She was treated with Bactrim postop for URI.  She was discharged in stable condition on 04/11/2020.  At discharge she was prescribed aspirin 325, Lopressor 37.5  twice daily, digoxin 0.125, furosemide x5 days, Lipitor 80 mg daily.  Her home Plavix, amlodipine, lisinopril were stopped at discharge.  She was seen by Dr. Cliffton Asters on 04/13/2020 for follow-up she reported some discomfort with ambulation but was overall doing well.  She was followed by pain Clinic at [facility].  She was advised to reach out to the pain clinic for pain therapy.  She was seen by Marjie Skiff, PA-C on 05/01/2020.  During that time she was doing relatively well.  She continued to have some soreness in her chest from her sternal incision.  She noted occasional sharp left-sided chest pain.  She reported that this pain was usually occur with bathing and could occur at rest.  She reported pain line on both sides.  She had no chest pain similar to what she had been having prior to admission.  She continued to have some shortness of breath with activity but was not sure how it compared to her baseline.  She denied orthopnea, PND, and significant lower extremity swelling.  She denied palpitations.  She had 2 episodes of brief lightheadedness/dizziness since  discharge but no syncope.  She did have concerns about her right leg sutures.  Her incisions were healing well.  No erythema or increased temperature noted.  She contacted nurse triage line on 07/10/2020 and reported that she was having increased shortness of breath.  She reported being more short of breath with activity such as walking from her car and normal daily activities.  She denied symptoms of chest pain.  She presents the clinic today for evaluation states she has noticed over the past few days she has had increased work of breathing with increased physical activity.  She reports that she notices improvement in her breathing with her Advair discus.  She reports compliance with the medication.  She reports that she is much more active now after having her open heart surgery that she was prior to her open heart surgery.  She is going  to cardiac rehab 3 days/week, doing housework, and walking when she goes to the store.  Her weights has remained stable.  She has not had any increased lower extremity edema.  We reviewed her echocardiogram and she expressed understanding.  I have asked her to continue to increase her physical activity, continue to monitor her diet, I will order a CBC and BMP.  We reviewed her prior chest x-ray and that she sounds clear on exam today.  I will have her follow-up with Dr. Antoine Poche in 3 months.  Today she denies chest pain, increased shortness of breath, lower extremity edema, fatigue, palpitations, melena, hematuria, hemoptysis, diaphoresis, weakness, presyncope, syncope, orthopnea, and PND.   Home Medications    Prior to Admission medications   Medication Sig Start Date End Date Taking? Authorizing Provider  acetaminophen (TYLENOL) 325 MG tablet Take 2 tablets (650 mg total) by mouth every 6 (six) hours as needed for mild pain. 04/05/20   Barrett, Erin R, PA-C  albuterol (VENTOLIN HFA) 108 (90 Base) MCG/ACT inhaler Inhale 2 puffs into the lungs every 6 (six) hours as needed for wheezing. 03/09/20   [provider]  aspirin EC 325 MG EC tablet Take 1 tablet (325 mg total) by mouth daily. 04/11/20   Ardelle Balls, PA-C  atorvastatin (LIPITOR) 80 MG tablet Take 80 mg by mouth daily. 03/09/20   [provider]  digoxin (LANOXIN) 0.125 MG tablet Take 1 tablet (0.125 mg total) by mouth daily. 05/29/20   Marjie Skiff E, PA-C  ferrous sulfate 325 (65 FE) MG tablet Take 1 tablet by mouth 2 (two) times daily. 03/24/20   [provider]  Fluticasone-Salmeterol (ADVAIR) 250-50 MCG/DOSE AEPB Inhale 1 puff into the lungs in the morning and at bedtime. 01/27/20   [provider]  furosemide (LASIX) 40 MG tablet Take 1 tablet (40 mg total) by mouth daily. 05/09/20 08/07/20  Marjie Skiff E, PA-C  gabapentin (NEURONTIN) 300 MG capsule Take 300 mg by mouth in the morning, at  noon, in the evening, and at bedtime. 02/01/20   [provider]  guaiFENesin (MUCINEX) 600 MG 12 hr tablet Take 1 tablet (600 mg total) by mouth 2 (two) times daily as needed for cough or to loosen phlegm. 04/11/20   Ardelle Balls, PA-C  HYDROcodone-acetaminophen (NORCO) 10-325 MG tablet Take 1 tablet by mouth every 6 (six) hours as needed.    [provider]  insulin aspart (NOVOLOG) 100 UNIT/ML injection Inject into the skin 3 (three) times daily before meals.    [provider]  insulin glargine (LANTUS) 100 UNIT/ML injection Inject 24 Units  into the skin as directed.    [provider]  metoprolol tartrate (LOPRESSOR) 25 MG tablet Take 1.5 tablets (37.5 mg total) by mouth 2 (two) times daily. 06/08/20   Heidi Rotunda, MD  montelukast (SINGULAIR) 10 MG tablet Take 10 mg by mouth daily. 01/05/20   [provider]  OMEPRAZOLE PO Take by mouth.    [provider]  potassium chloride SA (KLOR-CON) 20 MEQ tablet Take 1 tablet (20 mEq total) by mouth daily. Take only when you take your furosemide. 05/09/20   Corrin Parker, PA-C  valACYclovir (VALTREX) 500 MG tablet Take 500 mg by mouth as needed (for flare ups).    [provider]    Family History    History reviewed. No pertinent family history. has no family status information on file.   Social History    Social History   Socioeconomic History  . Marital status: Single    Spouse name: Not on file  . Number of children: Not on file  . Years of education: Not on file  . Highest education level: Not on file  Occupational History  . Not on file  Tobacco Use  . Smoking status: Never Smoker  . Smokeless tobacco: Never Used  Substance and Sexual Activity  . Alcohol use: Not on file  . Drug use: Not on file  . Sexual activity: Not on file  Other Topics Concern  . Not on file  Social History Narrative  . Not on file   Social Determinants of Health   Financial  Resource Strain: Not on file  Food Insecurity: Not on file  Transportation Needs: Not on file  Physical Activity: Not on file  Stress: Not on file  Social Connections: Not on file  Intimate Partner Violence: Not on file     Review of Systems    General:  No chills, fever, night sweats or weight changes.  Cardiovascular:  No chest pain, dyspnea on exertion, edema, orthopnea, palpitations, paroxysmal nocturnal dyspnea. Dermatological: No rash, lesions/masses Respiratory: No cough, dyspnea Urologic: No hematuria, dysuria Abdominal:   No nausea, vomiting, diarrhea, bright red blood per rectum, melena, or hematemesis Neurologic:  No visual changes, wkns, changes in mental status. All other systems reviewed and are otherwise negative except as noted above.  Physical Exam    VS:  BP 130/72   Pulse 64   Ht 5' (1.524 m)   Wt 221 lb (100.2 kg)   BMI 43.16 kg/m  , BMI Body mass index is 43.16 kg/m. GEN: Well nourished, well developed, in no acute distress. HEENT: normal. Neck: Supple, no JVD, carotid bruits, or masses. Cardiac: RRR, no murmurs, rubs, or gallops. No clubbing, cyanosis, edema.  Radials/DP/PT 2+ and equal bilaterally.  Respiratory:  Respirations regular and unlabored, clear to auscultation bilaterally. GI: Soft, nontender, nondistended, BS + x 4. MS: no deformity or atrophy. Skin: warm and dry, no rash. Neuro:  Strength and sensation are intact. Psych: Normal affect.  Accessory Clinical Findings    Recent Labs: 03/29/2020: ALT 12 04/04/2020: Magnesium 2.5 05/01/2020: BUN 12; Creatinine, Ser 0.60; Hemoglobin 11.7; Platelets 249; Potassium 4.1; Sodium 145   Recent Lipid Panel No results found for: CHOL, TRIG, HDL, CHOLHDL, VLDL, LDLCALC, LDLDIRECT  ECG personally reviewed by me today-normal sinus rhythm anterior infarct undetermined age 79 bpm- No acute changes  Cardiac catheterization 03/30/2020  Prox RCA to Mid RCA lesion is 40% stenosed.  Ost Cx lesion is 99%  stenosed.  Dist LM to [facility] LAD  lesion is 60% stenosed.  1. Patent left main stent with distal moderate restenosis. The stent extends into the LAD. There is haziness in the ostium of the LAD suggesting at least moderate restenosis of the stent. Another stent extends from the left main into the Circumflex. Severe ostial Circumflex restenosis. Good targets for bypass in the obtuse marginal branches and mid LAD.   Recommendations: She has restenosis of the distal left main stented segment, ostial Circumflex stented segment and ostial LAD stented segment. She has diabetes and good targets for bypass. While balloon angioplasty of the severely restenotic segments is possible, there would not be a good long term result with high chance of recurrent restenosis. I have reviewed her images with Dr. Antoine PocheHochrein. I think the best option is to consider bypass surgery. She is relatively young and a diabetic. Will ask CT surgery to see her. Also note that she has received Plavix today and has ongoing issues with anemia/GI bleeding. Plavix will be held going forward in anticipation of bypass surgery.  _______________  Pre-CABG Dopplers 04/01/2020: Summary: - Right Carotid: Velocities in the right ICA are consistent with a 40-59% stenosis.  - Left Carotid: Velocities in the left ICA are consistent with a less than 50% stenosis.  - Vertebrals: Bilateral vertebral arteries demonstrate antegrade flow.  - Right Upper Extremity: Doppler waveform obliterate with right radial compression. Doppler waveform obliterate with right ulnar compression.  - Left Upper Extremity: Doppler waveform obliterate with left radial compression. Doppler waveform obliterate with left ulnar compression.   Echocardiogram 05/23/2020 IMPRESSIONS    1. Left ventricular ejection fraction, by estimation, is 50 to 55%. The  left ventricle has low normal function. The left ventricle has no regional  wall motion abnormalities. There is mild left  ventricular hypertrophy.  Left ventricular diastolic  parameters are consistent with Grade II diastolic dysfunction  (pseudonormalization).  2. Right ventricular systolic function is mildly reduced. The right  ventricular size is normal. There is mildly elevated pulmonary artery  systolic pressure. The estimated right ventricular systolic pressure is  38.0 mmHg.  3. The mitral valve is grossly normal. Mild mitral valve regurgitation.  No evidence of mitral stenosis.  4. 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 AT 88 ms, DVI 0.35, EOA 1.1 cm2, iEOA 0.57 cm2. The aortic valve  has been repaired/replaced. Aortic valve regurgitation is not visualized.  No aortic stenosis is present. There  is a 21 mm Inspiris bioprosthetic valve present in the aortic position.  Procedure Date: 04/03/20. Aortic valve mean gradient measures 20.0 mmHg.  5. S/p aortic root enlargement. Stable appearance of ascending aorta..  Aortic root/ascending aorta has been enlarged.  6. The inferior vena cava is normal in size with <50% respiratory  variability, suggesting right atrial pressure of 8 mmHg.  Assessment & Plan   1.  Shortness of breath-increase shortness of breath with increased physical activity.  EKG today shows normal sinus rhythm anterior infarct undetermined age 65 bpm.  Appears to be related to decomposition post CABG along with asthma Continue Singulair, albuterol Heart healthy low-sodium diet-salty 6 given Increase physical activity as tolerated  Chronic anemia- History of GI bleed secondary to AVM.  Denies bleeding issues.  Reviewed importance of continuing iron and increasing vitamin C. Repeat CBC, BMP Continue iron Increase iron diet  Coronary artery disease/aortic valve stenosis -status post multiple PCI and CABG x2 on 04/03/2020 by Dr. Cliffton AstersLightfoot (LIMA-LAD and SVG-OM1) she also underwent AVR.  Follow-up echocardiogram showed EF 50-55%  and well-functioning AVR  with mild prosthetic valve stenosis.  Details above. Continue to monitor  Postoperative atrial fibrillation-EKG today shows normal sinus rhythm anterior infarct undetermined age 16 bpm.  Due to her iodine allergy she was not able to take amiodarone and was placed on digoxin and metoprolol.  She was noted to maintain sinus rhythm. Continue metoprolol, digoxin Heart healthy low-sodium diet-salty 6 given Increase physical activity as tolerated  Essential hypertension-BP today 130/72.  Well-controlled at home. Continue metoprolol Heart healthy low-sodium diet Increase physical activity as tolerated  Chronic systolic CHF/ischemic cardiomyopathy-euvolemic today.  Follow-up echocardiogram showed improvement in LVEF from 45-50% to 50-55%. Continue current medication regimen Heart healthy low-sodium diet-salty 6 given Increase physical activity as tolerated  Disposition: Follow-up with Dr. Antoine Poche or Rosalyn Gess, PA-C in 1-2 months.  Thomasene Ripple. Ren Aspinall NP-C    07/11/2020, 10:41 AM [facility] [address] Office [phone] Fax [phone]  Notice: This dictation was prepared with Dragon dictation along with smaller phrase technology. Any transcriptional errors that result from this process are unintentional and may not be corrected upon review.  I spent 15 minutes examining this patient, reviewing medications, and using patient centered shared decision making involving her cardiac care.  Prior to her visit I spent greater than 20 minutes reviewing her past medical history,  medications, and prior cardiac tests.

## 2020-07-11 NOTE — Patient Instructions (Addendum)
Medication Instructions:  The current medical regimen is effective;  continue present plan and medications as directed. Please refer to the Current Medication list given to you today. *If you need a refill on your cardiac medications before your next appointment, please call your pharmacy*  Lab Work:    BMET, CBC TODAY     Special Instructions CONTINUE CARDIAC REHAB  PLEASE READ AND FOLLOW SALTY 6-ATTACHED-1,800mg  daily  Follow-Up: Your next appointment:  3 month(s) In Person with Rollene Rotunda, MD ONLY  At [facility], you and your health needs are our priority.  As part of our continuing mission to provide you with exceptional heart care, we have created designated Provider Care Teams.  These Care Teams include your primary Cardiologist (physician) and Advanced Practice Providers (APPs -  Physician Assistants and Nurse Practitioners) who all work together to provide you with the care you need, when you need it.  We recommend signing up for the patient portal called "MyChart".  Sign up information is provided on this After Visit Summary.  MyChart is used to connect with patients for Virtual Visits (Telemedicine).  Patients are able to view lab/test results, encounter notes, upcoming appointments, etc.  Non-urgent messages can be sent to your provider as well.   To learn more about what you can do with MyChart, go to [email or web address].              6 SALTY THINGS TO AVOID     1,800MG  DAILY

## 2020-07-24 ENCOUNTER — Telehealth: Payer: Self-pay | Admitting: *Deleted

## 2020-07-24 NOTE — Telephone Encounter (Signed)
Ms. Masci called with c/o increasing SOB. Patient states she gets winded with ambulation. Patient states she was seen by her cardiologist last week. Patient denies any edema. Per patient, she has not been using her CPAP at night r/t equipment issues.  Patient advised to contact her pulmonologist. Patient verbalizes understanding.

## 2020-07-28 ENCOUNTER — Ambulatory Visit: Payer: Medicaid Other | Admitting: Cardiology

## 2020-08-09 ENCOUNTER — Telehealth: Payer: Self-pay | Admitting: Cardiology

## 2020-08-09 NOTE — Telephone Encounter (Signed)
I called patient, she states that she has SOB with activities- she has been doing cardiac rehab and does mention she has chest pain during this. She does not currently have chest pain, no issues right now. Patient states her BP/HR was good at cardiac rehab. Notes are in the chart from this visit today- she just wants to make sure everything is okay. I did make her an appointment for Friday with a PA here in the office- patient verbalized understanding, and was given ED precautions patient states she will go if the chest pains come back or symptoms worsen.  Will route to PA who will be seeing patient.   Thank you!

## 2020-08-09 NOTE — Telephone Encounter (Signed)
.  Pt c/o Shortness Of Breath: STAT if SOB developed within the last 24 hours or pt is noticeably SOB on the phone  1. Are you currently SOB (can you hear that pt is SOB on the phone)? No  2. How long have you been experiencing SOB?  About 2 weeks, getting worseeek  Are you SOB when sitting or when up moving around? When she moves around  4. Are you currently experiencing any other symptoms?chet started hurting yesterday- pt would like to be seen asap

## 2020-08-11 ENCOUNTER — Encounter: Payer: Self-pay | Admitting: Physician Assistant

## 2020-08-11 ENCOUNTER — Other Ambulatory Visit: Payer: Self-pay

## 2020-08-11 ENCOUNTER — Ambulatory Visit (INDEPENDENT_AMBULATORY_CARE_PROVIDER_SITE_OTHER): Payer: Medicaid Other | Admitting: Physician Assistant

## 2020-08-11 VITALS — BP 148/76 | HR 80 | Ht 60.0 in | Wt 221.2 lb

## 2020-08-11 DIAGNOSIS — Z952 Presence of prosthetic heart valve: Secondary | ICD-10-CM

## 2020-08-11 DIAGNOSIS — I25810 Atherosclerosis of coronary artery bypass graft(s) without angina pectoris: Secondary | ICD-10-CM

## 2020-08-11 DIAGNOSIS — E785 Hyperlipidemia, unspecified: Secondary | ICD-10-CM

## 2020-08-11 DIAGNOSIS — R0602 Shortness of breath: Secondary | ICD-10-CM | POA: Diagnosis not present

## 2020-08-11 DIAGNOSIS — I10 Essential (primary) hypertension: Secondary | ICD-10-CM

## 2020-08-11 DIAGNOSIS — I6523 Occlusion and stenosis of bilateral carotid arteries: Secondary | ICD-10-CM

## 2020-08-11 DIAGNOSIS — I739 Peripheral vascular disease, unspecified: Secondary | ICD-10-CM

## 2020-08-11 DIAGNOSIS — E119 Type 2 diabetes mellitus without complications: Secondary | ICD-10-CM

## 2020-08-11 DIAGNOSIS — I480 Paroxysmal atrial fibrillation: Secondary | ICD-10-CM

## 2020-08-11 NOTE — Progress Notes (Signed)
Cardiology Office Note:    Date:  08/12/2020   ID:  Heidi Morse, DOB [birth year], MRN [id number]  PCP:  Leola Brazil, DO   [facility] Providers Cardiologist:  Rollene Rotunda, MD {    Referring MD: Leola Brazil, DO   Chief Complaint  Patient presents with  . Follow-up    Seen for Dr. Antoine Poche    History of Present Illness:    Heidi Morse is a 65 y.o. female with a hx of CAD s/p CABG, aortic stenosis s/p AVR, postop atrial fibrillation, ischemic cardiomyopathy, PAD s/p left common femoral artery stenting 11/2017, bilateral carotid artery disease s/p stenting of left ICA, COPD, HTN, HLD, DM 2, and obstructive sleep apnea.  Patient has had a negative multiple stenting in the past.  She does have chronic chest pain.  Patient had Endoscopy in December 2021 showed bleeding AVMs.  She was subsequently admitted at [facility] for anemia and GI Bleed.  She was seen at [facility] ED on 07/27/2019 due to persistent chest pain unrelieved by nitroglycerin.  Work-up was unremarkable.  She was felt to have noncardiac type chest discomfort.  She presented to [facility] near the end of December for evaluation of chest pain again.  Troponin was negative.  Hemoglobin was 7.6 and the she was transfused with 1 pack of red blood cell.  GI service was contacted who did not recommend any intervention.  She subsequently underwent left heart cath that showed patent left main stent with distal moderate restenosis, haziness noted via ostial LAD suggesting moderate restenosis of the stent, severe restenosis of the ostial left circumflex stent.  Echocardiogram showed EF 45 to 50%, mild LVH, grade 2 DD, mild to moderate AI, moderate aortic stenosis and moderate MR.  Patient was seen by CT surgery and eventually underwent CABG x2 with LIMA to LAD and SVG to OM1 as well as AVR on 04/03/2020 by Dr. Cliffton Asters.  Postop course was complicated by atrial fibrillation with RVR.  She was unable to tolerate  amiodarone due to out her allergies. She was given digoxin and beta-blocker.  She subsequently converted back to sinus rhythm therefore was not started on anticoagulation therapy.   Patient was last seen by Edd Fabian, NP on 07/11/2020 for shortness of breath, she did not look volume overloaded.  It was recommended for her to follow-up in 3 months.  Patient presents today for worsening dyspnea in the past few weeks.  She says she gets short of breath after walking a few steps.  She denies any shortness of breath sitting down resting.  We ambulated her in the hallway, after walking half a lap around the office, her O2 saturation did dip down to 68% however quickly bounced back as soon as she rested.  I recommended CBC, basic metabolic panel, BNP, D-dimer and echocardiogram.  I discussed her case with DOD Dr. Antoine Poche.  I plan to see the patient back within 1 week for earlier follow-up.  On physical exam, I did not see significant lower extremity edema nor did I hear any crackles on lung exam.  I will contact her tomorrow to follow-up on the lab result.   Past Medical History:  Diagnosis Date  . Asthma   . Bilateral carotid artery stenosis   . CAD (coronary artery disease)    s/p CABG x2 in 04/2020  . CHF (congestive heart failure) (HCC)   . COPD (chronic obstructive pulmonary disease) (HCC)   . Diabetes mellitus without complication (HCC)   .  Hypertension   . MI (myocardial infarction) (HCC)   . PAD (peripheral artery disease) (HCC)   . Sleep apnea     Past Surgical History:  Procedure Laterality Date  . AORTIC ROOT ENLARGEMENT N/A 04/03/2020   Procedure: AORTIC ROOT ENLARGEMENT USING HEMASHIELD PLATINUM WOVEN DOUBLE VELOUR VASCULAR GRAFT 28 MM X 30 CM;  Surgeon: Corliss Skains, MD;  Location: [facility] OR;  Service: Open Heart Surgery;  Laterality: N/A;  . AORTIC VALVE REPLACEMENT N/A 04/03/2020   Procedure: CORONARY ARTERY BYPASS GRAFTING X 2 ON CARDIOPULMONARY BYPASS. AORTIC VALVE REPLACEMENT  USING 21 MM INSPIRIS RESILIA AORTIC VALVE, STERNAL PLATING;  Surgeon: Corliss Skains, MD;  Location: [facility] OR;  Service: Open Heart Surgery;  Laterality: N/A;  Coronary artery bypass grafting  Flow Trac  . ENDOVEIN HARVEST OF GREATER SAPHENOUS VEIN Right 04/03/2020   Procedure: ENDOVEIN HARVEST OF GREATER SAPHENOUS VEIN;  Surgeon: Corliss Skains, MD;  Location: [facility] OR;  Service: Open Heart Surgery;  Laterality: Right;  . LEFT HEART CATH AND CORONARY ANGIOGRAPHY N/A 03/30/2020   Procedure: LEFT HEART CATH AND CORONARY ANGIOGRAPHY;  Surgeon: Kathleene Hazel, MD;  Location: [facility] INVASIVE CV LAB;  Service: Cardiovascular;  Laterality: N/A;  . TEE WITHOUT CARDIOVERSION N/A 04/03/2020   Procedure: TRANSESOPHAGEAL ECHOCARDIOGRAM (TEE);  Surgeon: Corliss Skains, MD;  Location: [facility] OR;  Service: Open Heart Surgery;  Laterality: N/A;    Current Medications: Current Meds  Medication Sig  . acetaminophen (TYLENOL) 325 MG tablet Take 2 tablets (650 mg total) by mouth every 6 (six) hours as needed for mild pain.  Marland Kitchen albuterol (VENTOLIN HFA) 108 (90 Base) MCG/ACT inhaler Inhale 2 puffs into the lungs every 6 (six) hours as needed for wheezing.  Marland Kitchen aspirin EC 325 MG EC tablet Take 1 tablet (325 mg total) by mouth daily.  Marland Kitchen atorvastatin (LIPITOR) 80 MG tablet Take 80 mg by mouth daily.  . digoxin (LANOXIN) 0.125 MG tablet Take 1 tablet (0.125 mg total) by mouth daily.  . ferrous sulfate 325 (65 FE) MG tablet Take 1 tablet by mouth 2 (two) times daily.  . Fluticasone-Salmeterol (ADVAIR) 250-50 MCG/DOSE AEPB Inhale 1 puff into the lungs in the morning and at bedtime.  . furosemide (LASIX) 40 MG tablet Take by mouth.  . gabapentin (NEURONTIN) 300 MG capsule Take 300 mg by mouth in the morning, at noon, in the evening, and at bedtime.  Marland Kitchen guaiFENesin (MUCINEX) 600 MG 12 hr tablet Take 1 tablet (600 mg total) by mouth 2 (two) times daily as needed for cough or to loosen phlegm.  Marland Kitchen  HYDROcodone-acetaminophen (NORCO) 10-325 MG tablet Take 1 tablet by mouth every 6 (six) hours as needed.  . insulin aspart (NOVOLOG) 100 UNIT/ML injection Inject into the skin 3 (three) times daily before meals.  . insulin glargine (LANTUS) 100 UNIT/ML injection Inject 24 Units into the skin as directed.  . metoprolol tartrate (LOPRESSOR) 25 MG tablet Take 25 mg by mouth 2 (two) times daily.  . montelukast (SINGULAIR) 10 MG tablet Take 10 mg by mouth daily.  Marland Kitchen omeprazole (PRILOSEC) 40 MG capsule Take 40 mg by mouth in the morning and at bedtime.  . potassium chloride SA (KLOR-CON) 20 MEQ tablet Take 1 tablet (20 mEq total) by mouth daily. Take only when you take your furosemide.  . valACYclovir (VALTREX) 500 MG tablet Take 500 mg by mouth as needed (for flare ups).  . [DISCONTINUED] OMEPRAZOLE PO Take by mouth.     Allergies:  Peanut oil, Peanut-containing drug products, Povidone-iodine, Shellfish allergy, Statins, and Iodine   Social History   Socioeconomic History  . Marital status: Single    Spouse name: Not on file  . Number of children: Not on file  . Years of education: Not on file  . Highest education level: Not on file  Occupational History  . Not on file  Tobacco Use  . Smoking status: Never Smoker  . Smokeless tobacco: Never Used  Substance and Sexual Activity  . Alcohol use: Not on file  . Drug use: Not on file  . Sexual activity: Not on file  Other Topics Concern  . Not on file  Social History Narrative  . Not on file   Social Determinants of Health   Financial Resource Strain: Not on file  Food Insecurity: Not on file  Transportation Needs: Not on file  Physical Activity: Not on file  Stress: Not on file  Social Connections: Not on file     Family History: The patient's family history is not on file.  ROS:   Please see the history of present illness.     All other systems reviewed and are negative.  EKGs/Labs/Other Studies Reviewed:    The following  studies were reviewed today:  Echo 05/23/2020 1. Left ventricular ejection fraction, by estimation, is 50 to 55%. The  left ventricle has low normal function. The left ventricle has no regional  wall motion abnormalities. There is mild left ventricular hypertrophy.  Left ventricular diastolic  parameters are consistent with Grade II diastolic dysfunction  (pseudonormalization).  2. Right ventricular systolic function is mildly reduced. The right  ventricular size is normal. There is mildly elevated pulmonary artery  systolic pressure. The estimated right ventricular systolic pressure is  38.0 mmHg.  3. The mitral valve is grossly normal. Mild mitral valve regurgitation.  No evidence of mitral stenosis.  4. 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 AT 88 ms, DVI 0.35, EOA 1.1 cm2, iEOA 0.57 cm2. The aortic valve  has been repaired/replaced. Aortic valve regurgitation is not visualized.  No aortic stenosis is present. There  is a 21 mm Inspiris bioprosthetic valve present in the aortic position.  Procedure Date: 04/03/20. Aortic valve mean gradient measures 20.0 mmHg.  5. S/p aortic root enlargement. Stable appearance of ascending aorta..  Aortic root/ascending aorta has been enlarged.  6. The inferior vena cava is normal in size with <50% respiratory  variability, suggesting right atrial pressure of 8 mmHg.   EKG:  EKG is not ordered today.    Recent Labs: 03/29/2020: ALT 12 04/04/2020: Magnesium 2.5 08/11/2020: BNP WILL FOLLOW; BUN WILL FOLLOW; Creatinine, Ser WILL FOLLOW; Hemoglobin 10.4; Platelets 228; Potassium WILL FOLLOW; Sodium WILL FOLLOW  Recent Lipid Panel No results found for: CHOL, TRIG, HDL, CHOLHDL, VLDL, LDLCALC, LDLDIRECT   Risk Assessment/Calculations:    CHA2DS2-VASc Score = 5  This indicates a 7.2% annual risk of stroke. The patient's score is based upon: CHF History: Yes HTN History: Yes Diabetes History: Yes Stroke  History: No Vascular Disease History: Yes Age Score: 0 Gender Score: 1      Physical Exam:    VS:  BP (!) 148/76   Pulse 80   Ht 5' (1.524 m)   Wt 221 lb 3.2 oz (100.3 kg)   SpO2 97%   BMI 43.20 kg/m     Wt Readings from Last 3 Encounters:  08/11/20 221 lb 3.2 oz (100.3 kg)  07/11/20 221 lb (100.2  kg)  05/19/20 222 lb (100.7 kg)     GEN:  Well nourished, well developed in no acute distress HEENT: Normal NECK: No JVD; No carotid bruits LYMPHATICS: No lymphadenopathy CARDIAC: RRR, no murmurs, rubs, gallops RESPIRATORY:  Clear to auscultation without rales, wheezing or rhonchi  ABDOMEN: Soft, non-tender, non-distended MUSCULOSKELETAL:  No edema; No deformity  SKIN: Warm and dry NEUROLOGIC:  Alert and oriented x 3 PSYCHIATRIC:  Normal affect   ASSESSMENT:    1. SOB (shortness of breath)   2. Coronary artery disease involving coronary bypass graft of native heart without angina pectoris   3. S/P AVR   4. PAF (paroxysmal atrial fibrillation) (HCC)   5. PAD (peripheral artery disease) (HCC)   6. Bilateral carotid artery stenosis   7. Primary hypertension   8. Hyperlipidemia LDL goal <70   9. Controlled type 2 diabetes mellitus without complication, without long-term current use of insulin (HCC)    PLAN:    In order of problems listed above:  1. Shortness of breath: Symptom has been worsening for the past few weeks.  On exam, she does not appears to be significantly volume overloaded.  I did instruct her to take it a dose of Lasix tomorrow to see if this will help with her symptoms.  I recommended CBC, basic metabolic panel, BNP, D-dimer and urgent echocardiogram next week.  We ambulated the patient in the hallway, after half a lap around the office, her O2 saturation dropped down to 68% and she was noticeably short of breath.  Fortunately, O2 saturation quickly bounce back into the 90s after resting.  I plan to see the patient back in 1 week.  2. CAD s/p CABG: Denies  any significant chest discomfort  3. History of AVR: 2 out of 6 murmur on exam.  Repeat echocardiogram given shortness of breath to look at the heart valves and rule out significant pericardial effusion  4. Postop atrial fibrillation: No recurrence.  Heart rate quite regular on exam suggesting maintenance of sinus rhythm.  5. PAD: History of left common femoral artery stenting in September 2019  6. Carotid artery disease: History of stenting of left internal carotid artery  7. Hypertension: Blood pressure stable  8. Hyperlipidemia: On Lipitor  9. DM2: Managed by primary care provider        Medication Adjustments/Labs and Tests Ordered: Current medicines are reviewed at length with the patient today.  Concerns regarding medicines are outlined above.  Orders Placed This Encounter  Procedures  . Basic metabolic panel  . CBC  . Brain natriuretic peptide  . D-dimer, quantitative  . ECHOCARDIOGRAM COMPLETE   No orders of the defined types were placed in this encounter.   Patient Instructions  Medication Instructions:  Your physician recommends that you continue on your current medications as directed. Please refer to the Current Medication list given to you today.  *If you need a refill on your cardiac medications before your next appointment, please call your pharmacy*  Lab Work: Your physician recommends that you return for lab work TODAY:   BMET  BNP  CBC  D-Dimer If you have labs (blood work) drawn today and your tests are completely normal, you will receive your results only by: Marland Kitchen. MyChart Message (if you have MyChart) OR . A paper copy in the mail If you have any lab test that is abnormal or we need to change your treatment, we will call you to review the results.  Testing/Procedures: Your physician has requested that  you have an echocardiogram. Echocardiography is a painless test that uses sound waves to create images of your heart. It provides your doctor with  information about the size and shape of your heart and how well your heart's chambers and valves are working. This procedure takes approximately one hour. There are no restrictions for this procedure.   Please schedule Urgent within 1 week    Follow-Up: At [facility], you and your health needs are our priority.  As part of our continuing mission to provide you with exceptional heart care, we have created designated Provider Care Teams.  These Care Teams include your primary Cardiologist (physician) and Advanced Practice Providers (APPs -  Physician Assistants and Nurse Practitioners) who all work together to provide you with the care you need, when you need it.  We recommend signing up for the patient portal called "MyChart".  Sign up information is provided on this After Visit Summary.  MyChart is used to connect with patients for Virtual Visits (Telemedicine).  Patients are able to view lab/test results, encounter notes, upcoming appointments, etc.  Non-urgent messages can be sent to your provider as well.   To learn more about what you can do with MyChart, go to [email or web address].    Your next appointment:   Follow up after ECHOCARDIOGRAM   The format for your next appointment:   In Person  Provider:   Rollene Rotunda, MD or APPs  Other Instructions      Signed, Azalee Course, PA  08/12/2020 12:00 AM    [facility]

## 2020-08-11 NOTE — Patient Instructions (Signed)
Medication Instructions:  Your physician recommends that you continue on your current medications as directed. Please refer to the Current Medication list given to you today.  *If you need a refill on your cardiac medications before your next appointment, please call your pharmacy*  Lab Work: Your physician recommends that you return for lab work TODAY:   BMET  BNP  CBC  D-Dimer If you have labs (blood work) drawn today and your tests are completely normal, you will receive your results only by: Marland Kitchen MyChart Message (if you have MyChart) OR . A paper copy in the mail If you have any lab test that is abnormal or we need to change your treatment, we will call you to review the results.  Testing/Procedures: Your physician has requested that you have an echocardiogram. Echocardiography is a painless test that uses sound waves to create images of your heart. It provides your doctor with information about the size and shape of your heart and how well your heart's chambers and valves are working. This procedure takes approximately one hour. There are no restrictions for this procedure.   Please schedule Urgent within 1 week    Follow-Up: At [facility], you and your health needs are our priority.  As part of our continuing mission to provide you with exceptional heart care, we have created designated Provider Care Teams.  These Care Teams include your primary Cardiologist (physician) and Advanced Practice Providers (APPs -  Physician Assistants and Nurse Practitioners) who all work together to provide you with the care you need, when you need it.  We recommend signing up for the patient portal called "MyChart".  Sign up information is provided on this After Visit Summary.  MyChart is used to connect with patients for Virtual Visits (Telemedicine).  Patients are able to view lab/test results, encounter notes, upcoming appointments, etc.  Non-urgent messages can be sent to your provider as well.    To learn more about what you can do with MyChart, go to [email or web address].    Your next appointment:   Follow up after ECHOCARDIOGRAM   The format for your next appointment:   In Person  Provider:   Rollene Rotunda, MD or APPs  Other Instructions

## 2020-08-12 LAB — CBC
Hematocrit: 33.1 % — ABNORMAL LOW (ref 34.0–46.6)
Hemoglobin: 10.4 g/dL — ABNORMAL LOW (ref 11.1–15.9)
MCH: 25 pg — ABNORMAL LOW (ref 26.6–33.0)
MCHC: 31.4 g/dL — ABNORMAL LOW (ref 31.5–35.7)
MCV: 80 fL (ref 79–97)
Platelets: 228 10*3/uL (ref 150–450)
RBC: 4.16 x10E6/uL (ref 3.77–5.28)
RDW: 15.5 % — ABNORMAL HIGH (ref 11.7–15.4)
WBC: 6.6 10*3/uL (ref 3.4–10.8)

## 2020-08-12 LAB — BASIC METABOLIC PANEL
BUN/Creatinine Ratio: 19 (ref 12–28)
BUN: 13 mg/dL (ref 8–27)
CO2: 23 mmol/L (ref 20–29)
Calcium: 8.7 mg/dL (ref 8.7–10.3)
Chloride: 106 mmol/L (ref 96–106)
Creatinine, Ser: 0.67 mg/dL (ref 0.57–1.00)
Glucose: 78 mg/dL (ref 65–99)
Potassium: 4.4 mmol/L (ref 3.5–5.2)
Sodium: 144 mmol/L (ref 134–144)
eGFR: 98 mL/min/{1.73_m2} (ref >59)

## 2020-08-12 LAB — BRAIN NATRIURETIC PEPTIDE: BNP: 49.6 pg/mL (ref 0.0–100.0)

## 2020-08-12 LAB — D-DIMER, QUANTITATIVE: D-DIMER: 0.84 mg/L FEU — ABNORMAL HIGH (ref 0.00–0.49)

## 2020-08-12 NOTE — Progress Notes (Signed)
I have called and spoke with Heidi Morse, hemoglobin is only borderline low and it does not explain her degree of shortness of breath.  Renal function and electrolyte normal.  BNP is still pending.  If D-dimer elevated, she will need a CTA of the chest to rule out PE.  Aware there is currently a Sport and exercise psychologist of contrast, if unable to get CTA of the chest, we can consider VQ scan.

## 2020-08-14 ENCOUNTER — Ambulatory Visit (HOSPITAL_COMMUNITY)
Admission: RE | Admit: 2020-08-14 | Discharge: 2020-08-14 | Disposition: A | Discharge status: Home / Self Care | Payer: Medicaid Other | Source: Ambulatory Visit | Attending: Physician Assistant | Admitting: Physician Assistant

## 2020-08-14 ENCOUNTER — Other Ambulatory Visit: Payer: Self-pay

## 2020-08-14 DIAGNOSIS — I509 Heart failure, unspecified: Secondary | ICD-10-CM | POA: Diagnosis not present

## 2020-08-14 DIAGNOSIS — I252 Old myocardial infarction: Secondary | ICD-10-CM | POA: Insufficient documentation

## 2020-08-14 DIAGNOSIS — I2510 Atherosclerotic heart disease of native coronary artery without angina pectoris: Secondary | ICD-10-CM | POA: Diagnosis not present

## 2020-08-14 DIAGNOSIS — R0602 Shortness of breath: Secondary | ICD-10-CM | POA: Diagnosis present

## 2020-08-14 DIAGNOSIS — I340 Nonrheumatic mitral (valve) insufficiency: Secondary | ICD-10-CM | POA: Diagnosis not present

## 2020-08-14 DIAGNOSIS — I110 Hypertensive heart disease with heart failure: Secondary | ICD-10-CM | POA: Diagnosis not present

## 2020-08-14 DIAGNOSIS — E119 Type 2 diabetes mellitus without complications: Secondary | ICD-10-CM | POA: Insufficient documentation

## 2020-08-14 DIAGNOSIS — Z951 Presence of aortocoronary bypass graft: Secondary | ICD-10-CM | POA: Diagnosis not present

## 2020-08-14 DIAGNOSIS — Z6841 Body Mass Index (BMI) 40.0 and over, adult: Secondary | ICD-10-CM | POA: Diagnosis not present

## 2020-08-14 DIAGNOSIS — E785 Hyperlipidemia, unspecified: Secondary | ICD-10-CM | POA: Insufficient documentation

## 2020-08-14 DIAGNOSIS — J449 Chronic obstructive pulmonary disease, unspecified: Secondary | ICD-10-CM | POA: Diagnosis not present

## 2020-08-14 LAB — ECHOCARDIOGRAM COMPLETE
AR max vel: 1.71 cm2
AV Area VTI: 1.73 cm2
AV Area mean vel: 1.64 cm2
AV Mean grad: 14.4 mmHg
AV Peak grad: 26.7 mmHg
Ao pk vel: 2.58 m/s
Area-P 1/2: 4.39 cm2
Calc EF: 54.2 %
MV M vel: 5.96 m/s
MV Peak grad: 142.1 mmHg
MV VTI: 2.12 cm2
Radius: 0.4 cm
S' Lateral: 3.3 cm
Single Plane A2C EF: 54.3 %
Single Plane A4C EF: 55.4 %

## 2020-08-14 NOTE — Progress Notes (Signed)
I have called and discussed with Heidi Morse regarding echo result. No clear reason to explain her SOB and hypoxia. Echo mentioned mismatched valve, but there is no significant perivalvular leakage, nor does it explain the hypoxia. Given elevated d-dimer, recommended either CTA or V/Q scan (due to national contrast shortage), ideally CTA will give [place] more info regarding potential lung issue as well. Dr. Antoine Poche to review and see if there is any other recommendation

## 2020-08-14 NOTE — Progress Notes (Signed)
  Echocardiogram 2D Echocardiogram has been performed.  Heidi Morse 08/14/2020, 9:29 AM

## 2020-08-14 NOTE — Progress Notes (Signed)
BNP came back only 49, this does not suggest significant volume overload.

## 2020-08-15 ENCOUNTER — Other Ambulatory Visit: Payer: Self-pay

## 2020-08-15 ENCOUNTER — Telehealth: Payer: Self-pay | Admitting: Physician Assistant

## 2020-08-15 DIAGNOSIS — R079 Chest pain, unspecified: Secondary | ICD-10-CM

## 2020-08-15 DIAGNOSIS — R0602 Shortness of breath: Secondary | ICD-10-CM

## 2020-08-15 NOTE — Telephone Encounter (Signed)
Spoke with patient regarding the Thursday 08/04/20 1:00pm VQ scan appointment at [facility]---arrival time is 12:00 pm --1st floor admissions office for check in ---Chest -x-ray will be done first and then the VQ scan will be started,  Will mail information to patient and she voiced her understanding.

## 2020-08-16 ENCOUNTER — Encounter: Payer: Self-pay | Admitting: Physician Assistant

## 2020-08-16 ENCOUNTER — Other Ambulatory Visit: Payer: Self-pay

## 2020-08-16 ENCOUNTER — Ambulatory Visit (INDEPENDENT_AMBULATORY_CARE_PROVIDER_SITE_OTHER): Payer: Medicaid Other | Admitting: Physician Assistant

## 2020-08-16 ENCOUNTER — Telehealth: Payer: Self-pay | Admitting: Physician Assistant

## 2020-08-16 VITALS — BP 180/74 | HR 72 | Ht 60.0 in | Wt 222.2 lb

## 2020-08-16 DIAGNOSIS — I739 Peripheral vascular disease, unspecified: Secondary | ICD-10-CM

## 2020-08-16 DIAGNOSIS — I25810 Atherosclerosis of coronary artery bypass graft(s) without angina pectoris: Secondary | ICD-10-CM

## 2020-08-16 DIAGNOSIS — R0600 Dyspnea, unspecified: Secondary | ICD-10-CM

## 2020-08-16 DIAGNOSIS — E119 Type 2 diabetes mellitus without complications: Secondary | ICD-10-CM

## 2020-08-16 DIAGNOSIS — I480 Paroxysmal atrial fibrillation: Secondary | ICD-10-CM

## 2020-08-16 DIAGNOSIS — Z952 Presence of prosthetic heart valve: Secondary | ICD-10-CM | POA: Diagnosis not present

## 2020-08-16 DIAGNOSIS — E785 Hyperlipidemia, unspecified: Secondary | ICD-10-CM

## 2020-08-16 DIAGNOSIS — R0609 Other forms of dyspnea: Secondary | ICD-10-CM

## 2020-08-16 DIAGNOSIS — I10 Essential (primary) hypertension: Secondary | ICD-10-CM

## 2020-08-16 MED ORDER — METOPROLOL TARTRATE 25 MG PO TABS: ORAL_TABLET | ORAL | 1 refills | Status: DC

## 2020-08-16 NOTE — Progress Notes (Signed)
Cardiology Office Note:    Date:  08/18/2020   ID:  Heidi Morse, DOB [birth year], MRN [id number]  PCP:  Leola BrazilEverly, Rebecca B, DO   [facility] Providers Cardiologist:  Rollene RotundaJames Hochrein, MD {    Referring MD: Leola BrazilEverly, Rebecca B, DO   Chief Complaint  Patient presents with  . Follow-up    Seen for Dr. Antoine PocheHochrein    History of Present Illness:    Heidi Morse is a 65 y.o. female with a hx of CAD s/p CABG, aortic stenosis s/p AVR, postop atrial fibrillation, ischemic cardiomyopathy, PAD s/p left common femoral artery stenting 11/2017, bilateral carotid artery disease s/p stenting of left ICA, COPD, HTN, HLD, DM 2, and obstructive sleep apnea.  Patient has had multiple stenting in the past.  She does have chronic chest pain.  Patient had Endoscopy in December 2021 showed bleeding AVMs.  She was subsequently admitted at [facility] for anemia and GI Bleed.  She was seen at [facility] ED on 07/27/2019 due to persistent chest pain unrelieved by nitroglycerin.  Work-up was unremarkable.  She was felt to have noncardiac type chest discomfort.  [facility] the end of December for evaluation of chest pain again.  Troponin was negative.  Hemoglobin was 7.6 and the she was transfused with 1 pack of red blood cell.  GI service was contacted who did not recommend any intervention.  She subsequently underwent left heart cath that showed patent left main stent with distal moderate restenosis, haziness noted via ostial LAD suggesting moderate restenosis of the stent, severe restenosis of the ostial left circumflex stent.  Echocardiogram showed EF 45 to 50%, mild LVH, grade 2 DD, mild to moderate AI, moderate aortic stenosis and moderate MR.  Patient was seen by CT surgery and eventually underwent CABG x2 with LIMA to LAD and SVG to OM1 as well as AVR on 04/03/2020 by Dr. Cliffton AstersLightfoot.  Postop course was complicated by atrial fibrillation with RVR.  She was unable to tolerate amiodarone due to  iodine allergies. She was given digoxin and beta-blocker.  She subsequently converted back to sinus rhythm therefore was not started on anticoagulation therapy.   Patient was seen by Edd FabianJesse Cleaver, NP on 07/11/2020 for shortness of breath however she did not appear to be volume overloaded.  It was recommended for her to follow-up in 3 months.  She presented to our office on 08/11/2020 complaining of worsening dyspnea in the past few weeks.  Ambulatory O2 saturation dipped down to 60% was walking however quickly bounced back after resting.  CBC, basic metabolic panel, BNP, D-dimer and echocardiogram was recommended after discussing with Dr. Antoine PocheHochrein.  Hemoglobin was 10.4 which is not significantly DM1 compared to her April lab work.  Renal function and electrolyte were normal.  BNP was only 49.6.  D-dimer 0.84 which was positive.  Echocardiogram obtained on 08/14/2020 shows EF 50 to 55%, mild LVH, grade 2 DD, elevated left atrial pressure, mildly reduced RV EF, RV systolic pressure 42.2 mmHg, mild to moderate MR, bioprosthetic aortic valve present with stable patient-prosthesis mismatch, mean gradient 16 mmHg.  No significant change when compared to the previous TTE on 05/23/2020.  No evidence of pericardial effusion to explain her symptoms.  She is currently waiting for VQ scan, unable to do CTA due to national contrast shortage.  Patient presents today for follow-up.  Blood pressure is actually quite elevated.  She said her blood pressure has been elevated for a few days.  However looking at her blood pressure at her PCP and GI doctors office visit, her systolic blood pressure seems to be more in the 130s, she says her GI doctors office documented the blood pressure incorrectly and that she remembered it as 160s.  I will increase her metoprolol tartrate to 37.5 mg twice a day.  Since her VQ scan is on the same day as her follow-up with Dr. Antoine Poche, she is unable to make it to Dr. Jenene Slicker visit, I will push it  back by a few weeks.   Past Medical History:  Diagnosis Date  . Asthma   . Bilateral carotid artery stenosis   . CAD (coronary artery disease)    s/p CABG x2 in 04/2020  . CHF (congestive heart failure) (HCC)   . COPD (chronic obstructive pulmonary disease) (HCC)   . Diabetes mellitus without complication (HCC)   . Hypertension   . MI (myocardial infarction) (HCC)   . PAD (peripheral artery disease) (HCC)   . Sleep apnea     Past Surgical History:  Procedure Laterality Date  . AORTIC ROOT ENLARGEMENT N/A 04/03/2020   Procedure: AORTIC ROOT ENLARGEMENT USING HEMASHIELD PLATINUM WOVEN DOUBLE VELOUR VASCULAR GRAFT 28 MM X 30 CM;  Surgeon: Corliss Skains, MD;  Location: [facility] OR;  Service: Open Heart Surgery;  Laterality: N/A;  . AORTIC VALVE REPLACEMENT N/A 04/03/2020   Procedure: CORONARY ARTERY BYPASS GRAFTING X 2 ON CARDIOPULMONARY BYPASS. AORTIC VALVE REPLACEMENT USING 21 MM INSPIRIS RESILIA AORTIC VALVE, STERNAL PLATING;  Surgeon: Corliss Skains, MD;  Location: [facility] OR;  Service: Open Heart Surgery;  Laterality: N/A;  Coronary artery bypass grafting  Flow Trac  . ENDOVEIN HARVEST OF GREATER SAPHENOUS VEIN Right 04/03/2020   Procedure: ENDOVEIN HARVEST OF GREATER SAPHENOUS VEIN;  Surgeon: Corliss Skains, MD;  Location: [facility] OR;  Service: Open Heart Surgery;  Laterality: Right;  . LEFT HEART CATH AND CORONARY ANGIOGRAPHY N/A 03/30/2020   Procedure: LEFT HEART CATH AND CORONARY ANGIOGRAPHY;  Surgeon: Kathleene Hazel, MD;  Location: [facility] INVASIVE CV LAB;  Service: Cardiovascular;  Laterality: N/A;  . TEE WITHOUT CARDIOVERSION N/A 04/03/2020   Procedure: TRANSESOPHAGEAL ECHOCARDIOGRAM (TEE);  Surgeon: Corliss Skains, MD;  Location: [facility] OR;  Service: Open Heart Surgery;  Laterality: N/A;    Current Medications: Current Meds  Medication Sig  . acetaminophen (TYLENOL) 325 MG tablet Take 2 tablets (650 mg total) by mouth every 6 (six) hours as needed for mild pain.  Marland Kitchen  albuterol (VENTOLIN HFA) 108 (90 Base) MCG/ACT inhaler Inhale 2 puffs into the lungs every 6 (six) hours as needed for wheezing.  Marland Kitchen aspirin EC 325 MG EC tablet Take 1 tablet (325 mg total) by mouth daily.  Marland Kitchen atorvastatin (LIPITOR) 80 MG tablet Take 80 mg by mouth daily.  . digoxin (LANOXIN) 0.125 MG tablet Take 1 tablet (0.125 mg total) by mouth daily.  . ferrous sulfate 325 (65 FE) MG tablet Take 1 tablet by mouth 2 (two) times daily.  . Fluticasone-Salmeterol (ADVAIR) 250-50 MCG/DOSE AEPB Inhale 1 puff into the lungs in the morning and at bedtime.  . furosemide (LASIX) 40 MG tablet Take by mouth.  . gabapentin (NEURONTIN) 300 MG capsule Take 300 mg by mouth in the morning, at noon, in the evening, and at bedtime.  Marland Kitchen guaiFENesin (MUCINEX) 600 MG 12 hr tablet Take 1 tablet (600 mg total) by mouth 2 (two) times daily as needed for cough or to loosen phlegm.  Marland Kitchen HYDROcodone-acetaminophen (NORCO) 10-325 MG  tablet Take 1 tablet by mouth every 6 (six) hours as needed.  . insulin aspart (NOVOLOG) 100 UNIT/ML injection Inject into the skin 3 (three) times daily before meals.  . insulin glargine (LANTUS) 100 UNIT/ML injection Inject 24 Units into the skin as directed.  . montelukast (SINGULAIR) 10 MG tablet Take 10 mg by mouth daily.  Marland Kitchen omeprazole (PRILOSEC) 40 MG capsule Take 40 mg by mouth in the morning and at bedtime.  . potassium chloride SA (KLOR-CON) 20 MEQ tablet Take 1 tablet (20 mEq total) by mouth daily. Take only when you take your furosemide.  . valACYclovir (VALTREX) 500 MG tablet Take 500 mg by mouth as needed (for flare ups).  . [DISCONTINUED] metoprolol tartrate (LOPRESSOR) 25 MG tablet Take 25 mg by mouth 2 (two) times daily.     Allergies:   Peanut oil, Peanut-containing drug products, Povidone-iodine, Shellfish allergy, Statins, and Iodine   Social History   Socioeconomic History  . Marital status: Single    Spouse name: Not on file  . Number of children: Not on file  . Years of  education: Not on file  . Highest education level: Not on file  Occupational History  . Not on file  Tobacco Use  . Smoking status: Never Smoker  . Smokeless tobacco: Never Used  Substance and Sexual Activity  . Alcohol use: Not on file  . Drug use: Not on file  . Sexual activity: Not on file  Other Topics Concern  . Not on file  Social History Narrative  . Not on file   Social Determinants of Health   Financial Resource Strain: Not on file  Food Insecurity: Not on file  Transportation Needs: Not on file  Physical Activity: Not on file  Stress: Not on file  Social Connections: Not on file     Family History: The patient's family history is not on file.  ROS:   Please see the history of present illness.     All other systems reviewed and are negative.  EKGs/Labs/Other Studies Reviewed:    The following studies were reviewed today:  Echo 08/14/2020 1. Left ventricular ejection fraction, by estimation, is 50 to 55%. Left  ventricular ejection fraction by 3D volume is 51 %. The left ventricle has  low normal function. The left ventricle has no regional wall motion  abnormalities. There is mild  concentric left ventricular hypertrophy. Left ventricular diastolic  parameters are consistent with Grade II diastolic dysfunction  (pseudonormalization). Elevated left atrial pressure.  2. Right ventricular systolic function is mildly reduced. The right  ventricular size is normal. There is mildly elevated pulmonary artery  systolic pressure. The estimated right ventricular systolic pressure is  42.2 mmHg.  3. The mitral valve is grossly normal. Mild to moderate mitral valve  regurgitation. No evidence of mitral stenosis.  4. A 23mm Inspiris Resilia aortic valve bioprosthesis is present. There  is stable patient-prosthesis mismatch with AT 92, DI 0.33, EOA 1.05 iEOA  0.53. Mean gradient . There is trivial paravalvular leak. . The  aortic valve has been   repaired/replaced. There is a 21 mm Inspiris bioprosthetic valve present  in the aortic position. Procedure Date: 04/03/20.  5. Patient is s/p aortic root enlargement. The appearance of the aortic  root and ascending aorta appears stable.  6. The inferior vena cava is normal in size with greater than 50%  respiratory variability, suggesting right atrial pressure of 3 mmHg.   Comparison(s): Compared to prior TTE on 05/23/20, there  is no significant  change.   EKG:  EKG is not ordered today.   Recent Labs: 03/29/2020: ALT 12 04/04/2020: Magnesium 2.5 08/11/2020: BNP 49.6; BUN 13; Creatinine, Ser 0.67; Hemoglobin 10.4; Platelets 228; Potassium 4.4; Sodium 144  Recent Lipid Panel No results found for: CHOL, TRIG, HDL, CHOLHDL, VLDL, LDLCALC, LDLDIRECT   Risk Assessment/Calculations:    CHA2DS2-VASc Score = 5  This indicates a 7.2% annual risk of stroke. The patient's score is based upon: CHF History: Yes HTN History: Yes Diabetes History: Yes Stroke History: No Vascular Disease History: Yes Age Score: 0 Gender Score: 1      Physical Exam:    VS:  BP (!) 180/74   Pulse 72   Ht 5' (1.524 m)   Wt 222 lb 3.2 oz (100.8 kg)   SpO2 100%   BMI 43.40 kg/m     Wt Readings from Last 3 Encounters:  08/16/20 222 lb 3.2 oz (100.8 kg)  08/11/20 221 lb 3.2 oz (100.3 kg)  07/11/20 221 lb (100.2 kg)     GEN:  Well nourished, well developed in no acute distress HEENT: Normal NECK: No JVD; No carotid bruits LYMPHATICS: No lymphadenopathy CARDIAC: RRR, no murmurs, rubs, gallops RESPIRATORY:  Clear to auscultation without rales, wheezing or rhonchi  ABDOMEN: Soft, non-tender, non-distended MUSCULOSKELETAL:  No edema; No deformity  SKIN: Warm and dry NEUROLOGIC:  Alert and oriented x 3 PSYCHIATRIC:  Normal affect   ASSESSMENT:    1. Dyspnea on exertion   2. Coronary artery disease involving coronary bypass graft of native heart without angina pectoris   3. S/P AVR   4. PAF  (paroxysmal atrial fibrillation) (HCC)   5. PAD (peripheral artery disease) (HCC)   6. Primary hypertension   7. Hyperlipidemia LDL goal <70   8. Controlled type 2 diabetes mellitus without complication, without long-term current use of insulin (HCC)    PLAN:    In order of problems listed above:  1. Shortness of breath with exertion: She has been having this issue for the past few months.  Previously ambulated in the office and her O2 saturation dropped.  We ambulated her in the office again, although shortness of breath occurs with minimal activity, oxygen saturation did not drop today.  Recent CBC, basic metabolic panel and echocardiogram were reassuring.  D-dimer elevated, she is due for VQ scan.  Although CTA is better, however there is currently a Fish farm manager.  2. CAD status post CABG: Denies any significant chest discomfort   3. History of AVR: Repeat echocardiogram shows stable bioprosthetic aortic valve  4. Postop atrial fibrillation: No recurrence.  5. PAD: History of left common femoral artery stenting in September 2019  6. Carotid artery disease: History of stenting of left internal carotid artery  7. Hypertension: Blood pressure elevated today, will increase metoprolol to 37.5 mg twice a day  8. Hyperlipidemia: On Lipitor  9. DM2: Managed by primary care provider.        Medication Adjustments/Labs and Tests Ordered: Current medicines are reviewed at length with the patient today.  Concerns regarding medicines are outlined above.  No orders of the defined types were placed in this encounter.  Meds ordered this encounter  Medications  . DISCONTD: metoprolol tartrate (LOPRESSOR) 25 MG tablet    Sig: TAKE 1 AND 1/2 TABLETS DAILY    Dispense:  135 tablet    Refill:  1    NEW DOSE, PATIENT WILL CALL WHEN NEEDS FILLED    Patient  Instructions  Medication Instructions:  INCREASE YOUR METOPROLOL TART TO 25 MG 1 AND 1/2 TABLETS TWICE A DAY   *If you  need a refill on your cardiac medications before your next appointment, please call your pharmacy*  Lab Work: NONE   Testing/Procedures: GET YOUR VQ SCAN AND CHEST XRAY NEXT WEEK AS SCHEDULED   Follow-Up: At [facility], you and your health needs are our priority.  As part of our continuing mission to provide you with exceptional heart care, we have created designated Provider Care Teams.  These Care Teams include your primary Cardiologist (physician) and Advanced Practice Providers (APPs -  Physician Assistants and Nurse Practitioners) who all work together to provide you with the care you need, when you need it.  We recommend signing up for the patient portal called "MyChart".  Sign up information is provided on this After Visit Summary.  MyChart is used to connect with patients for Virtual Visits (Telemedicine).  Patients are able to view lab/test results, encounter notes, upcoming appointments, etc.  Non-urgent messages can be sent to your provider as well.   To learn more about what you can do with MyChart, go to [email or web address].    Your next appointment:  YOUR APPOINTMENT NEXT WEEK WITH DR [facility] HAS BEEN MOVED TO  09/04/2020 AT 11:40 AM    Ventilation-Perfusion Scan  A ventilation-perfusion scan is a procedure to look at airflow and blood flow in the lungs. It is most often done to look for a blood clot that may have traveled to the lungs (pulmonary embolus). During this scan, radioactive compounds are injected into the bloodstream and are also breathed in. The compounds are not harmful. They are given at very low doses and stay in the body for a short time. After the compounds are injected and breathed in, a camera is used to scan the lungs. Tell a health care provider about:  Any allergies you have.  All medicines you are taking, including vitamins, herbs, eye drops, creams, and over-the-counter medicines.  Any blood disorders you have.  Any surgeries you have  had.  Any medical conditions you have.  Whether you are pregnant or may be pregnant.  Whether or not you are breastfeeding. What are the risks? Generally, this is a safe procedure. However, problems may occur, including an allergic reaction to the radioactive compounds. What happens before the procedure?  Do not use any products that contain nicotine or tobacco, such as cigarettes and e-cigarettes. If you need help quitting, ask your health care provider.  Take over-the-counter and prescription medicines only as told by your health care provider. What happens during the procedure?  An IV will be inserted into one of your veins. The IV will stay in place for the entire exam.  A small amount of radioactive material will be injected into your bloodstream.  Your lungs will be scanned with a camera. This camera will record the images.  You will be asked to inhale a second radioactive compound. Take deep breaths as directed by the health care provider.  Your lungs will be scanned again.  Your IV will be removed. The procedure may vary among health care providers and hospitals. What happens after the procedure?  You may go home unless your health care provider instructs you differently.  You may continue with your normal activities and diet as instructed by your health care provider.  It is up to you to get the results of your procedure. Ask your health care provider, or the  department that is doing the procedure, when your results will be ready. Summary  A ventilation-perfusion scan is a scan to look at airflow and blood flow in the lungs. It is most often done to look for blood clots that may have traveled to the lungs.  The radioactive compounds are not harmful.  Your lungs will be scanned with a camera. This camera will record the images.  It is up to you to get the results of your procedure. Ask your health care provider, or the department that is doing the procedure, when  your results will be ready. This information is not intended to replace advice given to you by your health care provider. Make sure you discuss any questions you have with your health care provider. Document Revised: 02/28/2017 Document Reviewed: 04/25/2016 Elsevier Patient Education  [address].     Signed, [place], [place]  08/18/2020 2:26 PM    [facility]

## 2020-08-16 NOTE — Telephone Encounter (Signed)
Pt c/o medication issue:  1. Name of Medication: metoprolol tartrate (LOPRESSOR) 25MG   2. How are you currently taking this medication (dosage and times per day)? 1.5 TABLET ONCE A DAY  3. Are you having a reaction (difficulty breathing--STAT)? N/A  4. What is your medication issue?  PT HAD FOLLOW UP APPT THIS MORNING W/ DR. HAO, PT STATES THAT SHE TAKES metoprolol tartrate (LOPRESSOR) 25MG  1.5 TABLET ONCE A DAY, PT WAS TOLD THAT THIS WOULD BE INCREASED TO 25MG  1.5 TABLET TWICE DAILY, DR. NOTES REFLECTS THIS RX WAS SENT OVER AS ORIGINAL DOSAGE AND NEEDS TO BE CORRECTED

## 2020-08-16 NOTE — Telephone Encounter (Signed)
Attempted to call patient, left message for patient to call back to office.   

## 2020-08-16 NOTE — Patient Instructions (Signed)
Medication Instructions:  INCREASE YOUR METOPROLOL TART TO 25 MG 1 AND 1/2 TABLETS TWICE A DAY   *If you need a refill on your cardiac medications before your next appointment, please call your pharmacy*  Lab Work: NONE   Testing/Procedures: GET YOUR VQ SCAN AND CHEST XRAY NEXT WEEK AS SCHEDULED   Follow-Up: At [facility], you and your health needs are our priority.  As part of our continuing mission to provide you with exceptional heart care, we have created designated Provider Care Teams.  These Care Teams include your primary Cardiologist (physician) and Advanced Practice Providers (APPs -  Physician Assistants and Nurse Practitioners) who all work together to provide you with the care you need, when you need it.  We recommend signing up for the patient portal called "MyChart".  Sign up information is provided on this After Visit Summary.  MyChart is used to connect with patients for Virtual Visits (Telemedicine).  Patients are able to view lab/test results, encounter notes, upcoming appointments, etc.  Non-urgent messages can be sent to your provider as well.   To learn more about what you can do with MyChart, go to [email or web address].    Your next appointment:  YOUR APPOINTMENT NEXT WEEK WITH DR [facility] HAS BEEN MOVED TO  09/04/2020 AT 11:40 AM    Ventilation-Perfusion Scan  A ventilation-perfusion scan is a procedure to look at airflow and blood flow in the lungs. It is most often done to look for a blood clot that may have traveled to the lungs (pulmonary embolus). During this scan, radioactive compounds are injected into the bloodstream and are also breathed in. The compounds are not harmful. They are given at very low doses and stay in the body for a short time. After the compounds are injected and breathed in, a camera is used to scan the lungs. Tell a health care provider about:  Any allergies you have.  All medicines you are taking, including vitamins, herbs, eye  drops, creams, and over-the-counter medicines.  Any blood disorders you have.  Any surgeries you have had.  Any medical conditions you have.  Whether you are pregnant or may be pregnant.  Whether or not you are breastfeeding. What are the risks? Generally, this is a safe procedure. However, problems may occur, including an allergic reaction to the radioactive compounds. What happens before the procedure?  Do not use any products that contain nicotine or tobacco, such as cigarettes and e-cigarettes. If you need help quitting, ask your health care provider.  Take over-the-counter and prescription medicines only as told by your health care provider. What happens during the procedure?  An IV will be inserted into one of your veins. The IV will stay in place for the entire exam.  A small amount of radioactive material will be injected into your bloodstream.  Your lungs will be scanned with a camera. This camera will record the images.  You will be asked to inhale a second radioactive compound. Take deep breaths as directed by the health care provider.  Your lungs will be scanned again.  Your IV will be removed. The procedure may vary among health care providers and hospitals. What happens after the procedure?  You may go home unless your health care provider instructs you differently.  You may continue with your normal activities and diet as instructed by your health care provider.  It is up to you to get the results of your procedure. Ask your health care provider, or the department that is  doing the procedure, when your results will be ready. Summary  A ventilation-perfusion scan is a scan to look at airflow and blood flow in the lungs. It is most often done to look for blood clots that may have traveled to the lungs.  The radioactive compounds are not harmful.  Your lungs will be scanned with a camera. This camera will record the images.  It is up to you to get the  results of your procedure. Ask your health care provider, or the department that is doing the procedure, when your results will be ready. This information is not intended to replace advice given to you by your health care provider. Make sure you discuss any questions you have with your health care provider. Document Revised: 02/28/2017 Document Reviewed: 04/25/2016 Elsevier Patient Education  2021 [employer].

## 2020-08-18 ENCOUNTER — Encounter: Payer: Self-pay | Admitting: Physician Assistant

## 2020-08-18 MED ORDER — METOPROLOL TARTRATE 25 MG PO TABS: ORAL_TABLET | ORAL | 3 refills | Status: DC

## 2020-08-18 NOTE — Telephone Encounter (Signed)
REFILL SENT INCORRECT SIG, RESENT NEW RX

## 2020-08-18 NOTE — Telephone Encounter (Signed)
Pt is returning call from Tuesday. Please advise

## 2020-08-24 ENCOUNTER — Encounter (HOSPITAL_COMMUNITY)
Admission: RE | Admit: 2020-08-24 | Discharge: 2020-08-24 | Disposition: A | Discharge status: Home / Self Care | Payer: Medicaid Other | Source: Ambulatory Visit | Attending: Physician Assistant | Admitting: Physician Assistant

## 2020-08-24 ENCOUNTER — Ambulatory Visit (HOSPITAL_COMMUNITY)
Admission: RE | Admit: 2020-08-24 | Discharge: 2020-08-24 | Disposition: A | Discharge status: Home / Self Care | Payer: Medicaid Other | Source: Ambulatory Visit | Attending: Physician Assistant | Admitting: Physician Assistant

## 2020-08-24 ENCOUNTER — Ambulatory Visit: Payer: Medicaid Other | Admitting: Cardiology

## 2020-08-24 ENCOUNTER — Other Ambulatory Visit: Payer: Self-pay | Admitting: Physician Assistant

## 2020-08-24 ENCOUNTER — Other Ambulatory Visit: Payer: Self-pay

## 2020-08-24 DIAGNOSIS — R0602 Shortness of breath: Secondary | ICD-10-CM | POA: Insufficient documentation

## 2020-08-24 DIAGNOSIS — R079 Chest pain, unspecified: Secondary | ICD-10-CM

## 2020-08-24 MED ORDER — TECHNETIUM TO 99M ALBUMIN AGGREGATED
4.3000 | Freq: Once | INTRAVENOUS | Status: AC | PRN
  Administered 2020-08-24: 4.3 via INTRAVENOUS

## 2020-08-25 ENCOUNTER — Telehealth: Payer: Self-pay | Admitting: Cardiology

## 2020-08-25 NOTE — Telephone Encounter (Signed)
This RN returned patient's call, pt was requesting results of NM pulmonary perfusion test and DG Chest 2 view. This RN told patient that the tests have not had a result note entered by the provider, which serves to summarize the result and give any needed recommendations. This RN let patient know that when her tests resulted  she would be notified via letter, MyChart, or phone call. Patient verbalized understanding, all questions/concerns addressed at this time.

## 2020-08-25 NOTE — Telephone Encounter (Signed)
New message:     Patient calling to get the results.

## 2020-08-28 ENCOUNTER — Emergency Department (HOSPITAL_COMMUNITY): Payer: Medicaid Other

## 2020-08-28 ENCOUNTER — Other Ambulatory Visit: Payer: Self-pay

## 2020-08-28 ENCOUNTER — Encounter (HOSPITAL_COMMUNITY): Payer: Self-pay | Admitting: Emergency Medicine

## 2020-08-28 ENCOUNTER — Emergency Department (HOSPITAL_COMMUNITY)
Admission: EM | Admit: 2020-08-28 | Discharge: 2020-08-28 | Disposition: A | Discharge status: Home / Self Care | Payer: Medicaid Other | Attending: Emergency Medicine | Admitting: Emergency Medicine

## 2020-08-28 ENCOUNTER — Emergency Department (HOSPITAL_BASED_OUTPATIENT_CLINIC_OR_DEPARTMENT_OTHER): Payer: Medicaid Other

## 2020-08-28 DIAGNOSIS — E1151 Type 2 diabetes mellitus with diabetic peripheral angiopathy without gangrene: Secondary | ICD-10-CM | POA: Diagnosis not present

## 2020-08-28 DIAGNOSIS — I25110 Atherosclerotic heart disease of native coronary artery with unstable angina pectoris: Secondary | ICD-10-CM | POA: Insufficient documentation

## 2020-08-28 DIAGNOSIS — R079 Chest pain, unspecified: Secondary | ICD-10-CM

## 2020-08-28 DIAGNOSIS — J45909 Unspecified asthma, uncomplicated: Secondary | ICD-10-CM | POA: Diagnosis not present

## 2020-08-28 DIAGNOSIS — Z951 Presence of aortocoronary bypass graft: Secondary | ICD-10-CM | POA: Insufficient documentation

## 2020-08-28 DIAGNOSIS — E119 Type 2 diabetes mellitus without complications: Secondary | ICD-10-CM | POA: Diagnosis not present

## 2020-08-28 DIAGNOSIS — Z794 Long term (current) use of insulin: Secondary | ICD-10-CM | POA: Diagnosis not present

## 2020-08-28 DIAGNOSIS — R0600 Dyspnea, unspecified: Secondary | ICD-10-CM

## 2020-08-28 DIAGNOSIS — R011 Cardiac murmur, unspecified: Secondary | ICD-10-CM | POA: Insufficient documentation

## 2020-08-28 DIAGNOSIS — Z91010 Allergy to peanuts: Secondary | ICD-10-CM | POA: Diagnosis not present

## 2020-08-28 DIAGNOSIS — J449 Chronic obstructive pulmonary disease, unspecified: Secondary | ICD-10-CM | POA: Diagnosis not present

## 2020-08-28 DIAGNOSIS — R7989 Other specified abnormal findings of blood chemistry: Secondary | ICD-10-CM

## 2020-08-28 DIAGNOSIS — R0609 Other forms of dyspnea: Secondary | ICD-10-CM | POA: Insufficient documentation

## 2020-08-28 DIAGNOSIS — R2241 Localized swelling, mass and lump, right lower limb: Secondary | ICD-10-CM | POA: Diagnosis not present

## 2020-08-28 DIAGNOSIS — Z79899 Other long term (current) drug therapy: Secondary | ICD-10-CM | POA: Diagnosis not present

## 2020-08-28 DIAGNOSIS — Z7982 Long term (current) use of aspirin: Secondary | ICD-10-CM | POA: Insufficient documentation

## 2020-08-28 LAB — TROPONIN I (HIGH SENSITIVITY)
Troponin I (High Sensitivity): 17 ng/L (ref <18)
Troponin I (High Sensitivity): 18 ng/L — ABNORMAL HIGH (ref <18)

## 2020-08-28 LAB — CBC
HCT: 30.2 % — ABNORMAL LOW (ref 36.0–46.0)
Hemoglobin: 9.2 g/dL — ABNORMAL LOW (ref 12.0–15.0)
MCH: 25.5 pg — ABNORMAL LOW (ref 26.0–34.0)
MCHC: 30.5 g/dL (ref 30.0–36.0)
MCV: 83.7 fL (ref 80.0–100.0)
Platelets: 235 10*3/uL (ref 150–400)
RBC: 3.61 MIL/uL — ABNORMAL LOW (ref 3.87–5.11)
RDW: 18.6 % — ABNORMAL HIGH (ref 11.5–15.5)
WBC: 8.6 10*3/uL (ref 4.0–10.5)
nRBC: 0 % (ref 0.0–0.2)

## 2020-08-28 LAB — BASIC METABOLIC PANEL
Anion gap: 6 (ref 5–15)
BUN: 18 mg/dL (ref 8–23)
CO2: 25 mmol/L (ref 22–32)
Calcium: 8.8 mg/dL — ABNORMAL LOW (ref 8.9–10.3)
Chloride: 110 mmol/L (ref 98–111)
Creatinine, Ser: 0.68 mg/dL (ref 0.44–1.00)
GFR, Estimated: >60 mL/min (ref >60)
Glucose, Bld: 140 mg/dL — ABNORMAL HIGH (ref 70–99)
Potassium: 3.8 mmol/L (ref 3.5–5.1)
Sodium: 141 mmol/L (ref 135–145)

## 2020-08-28 LAB — BRAIN NATRIURETIC PEPTIDE: B Natriuretic Peptide: 199 pg/mL — ABNORMAL HIGH (ref 0.0–100.0)

## 2020-08-28 MED ORDER — ASPIRIN 81 MG PO CHEW
324.0000 mg | CHEWABLE_TABLET | Freq: Once | ORAL | Status: AC
  Administered 2020-08-28: 324 mg via ORAL
  Filled 2020-08-28: qty 4

## 2020-08-28 MED ORDER — FUROSEMIDE 20 MG PO TABS
40.0000 mg | ORAL_TABLET | Freq: Once | ORAL | Status: AC
  Administered 2020-08-28: 40 mg via ORAL
  Filled 2020-08-28: qty 2

## 2020-08-28 MED ORDER — FUROSEMIDE 10 MG/ML IJ SOLN: 40.0000 mg | Freq: Once | INTRAMUSCULAR | Status: DC

## 2020-08-28 NOTE — ED Provider Notes (Signed)
Emergency Medicine Provider Triage Evaluation Note  Heidi Morse , a 65 y.o. female  was evaluated in triage.  Pt complains of cp.  Review of Systems  Positive: Cp, sob, leg swelling R>L Negative: Fever, cough  Physical Exam  BP 118/81 (BP Location: Right Arm)   Pulse 70   Temp 98.2 F (36.8 C) (Oral)   Resp 20   SpO2 98%  Gen:   Awake, no distress   Resp:  Normal effort  MSK:   Moves extremities without difficulty  Other:  RLE edema  Medical Decision Making  Medically screening exam initiated at 11:13 AM.  Appropriate orders placed.  Breona Cherubin was informed that the remainder of the evaluation will be completed by another provider, this initial triage assessment does not replace that evaluation, and the importance of remaining in the ED until their evaluation is complete.  Hx of cardiac disease here with recurrent cp, sob and now leg swelling.    Fayrene Helper, PA-C 08/28/20 1114    Cheryll Cockayne, MD 09/01/20 [phone]

## 2020-08-28 NOTE — ED Provider Notes (Signed)
[facility]   CSN: [id number] Arrival date & time: 08/28/20  1105     History No chief complaint on file.   Cristal GenerousKaren Ekstrom is a 65 y.o. female.  HPI 65 year old female presents with shortness of breath and chest pain.  She has been dealing with exertional dyspnea for about a month.  She has had an outpatient work-up by her cardiologist that included a negative chest x-ray and negative VQ scan late last week.  However the day after the VQ scan she has noticed right leg swelling.  It is her right leg only from foot to calf.  Maybe a little bit of cough but no fevers.  She is also having sharp intermittent chest pain when she walks for the last week as well. No rectal bleeding or melena.  Past Medical History:  Diagnosis Date  . Asthma   . Bilateral carotid artery stenosis   . CAD (coronary artery disease)    s/p CABG x2 in 04/2020  . CHF (congestive heart failure) (HCC)   . COPD (chronic obstructive pulmonary disease) (HCC)   . Diabetes mellitus without complication (HCC)   . Hypertension   . MI (myocardial infarction) (HCC)   . PAD (peripheral artery disease) (HCC)   . Sleep apnea     Patient Active Problem List   Diagnosis Date Noted  . PAD (peripheral artery disease) (HCC) 05/01/2020  . Bilateral carotid artery stenosis 05/01/2020  . S/P CABG x 2 04/03/2020  . S/P aortic valve replacement with bioprosthetic valve 04/03/2020  . Coronary artery disease 04/03/2020  . Unstable angina (HCC) 03/29/2020  . Aortic stenosis 03/29/2020  . Symptomatic anemia 03/28/2020    Past Surgical History:  Procedure Laterality Date  . AORTIC ROOT ENLARGEMENT N/A 04/03/2020   Procedure: AORTIC ROOT ENLARGEMENT USING HEMASHIELD PLATINUM WOVEN DOUBLE VELOUR VASCULAR GRAFT 28 MM X 30 CM;  Surgeon: Corliss SkainsLightfoot, Harrell O, MD;  [facility];  Service: Open Heart Surgery;  Laterality: N/A;  . AORTIC VALVE REPLACEMENT N/A 04/03/2020   Procedure:  CORONARY ARTERY BYPASS GRAFTING X 2 ON CARDIOPULMONARY BYPASS. AORTIC VALVE REPLACEMENT USING 21 MM INSPIRIS RESILIA AORTIC VALVE, STERNAL PLATING;  Surgeon: Corliss SkainsLightfoot, Harrell O, MD;  Location: [facility] OR;  Service: Open Heart Surgery;  Laterality: N/A;  Coronary artery bypass grafting  Flow Trac  . ENDOVEIN HARVEST OF GREATER SAPHENOUS VEIN Right 04/03/2020   Procedure: ENDOVEIN HARVEST OF GREATER SAPHENOUS VEIN;  Surgeon: Corliss SkainsLightfoot, Harrell O, MD;  Location: [facility] OR;  Service: Open Heart Surgery;  Laterality: Right;  . LEFT HEART CATH AND CORONARY ANGIOGRAPHY N/A 03/30/2020   Procedure: LEFT HEART CATH AND CORONARY ANGIOGRAPHY;  Surgeon: Kathleene HazelMcAlhany, Christopher D, MD;  Location: [facility] INVASIVE CV LAB;  Service: Cardiovascular;  Laterality: N/A;  . TEE WITHOUT CARDIOVERSION N/A 04/03/2020   Procedure: TRANSESOPHAGEAL ECHOCARDIOGRAM (TEE);  Surgeon: Corliss SkainsLightfoot, Harrell O, MD;  [facility];  Service: Open Heart Surgery;  Laterality: N/A;     OB History   No obstetric history on file.     No family history on file.  Social History   Tobacco Use  . Smoking status: Never Smoker  . Smokeless tobacco: Never Used    Home Medications Prior to Admission medications   Medication Sig Start Date End Date Taking? Authorizing Provider  albuterol (VENTOLIN HFA) 108 (90 Base) MCG/ACT inhaler Inhale 2 puffs into the lungs every 6 (six) hours as needed for wheezing. 03/09/20  Yes [provider]  aspirin EC  325 MG EC tablet Take 1 tablet (325 mg total) by mouth daily. Patient taking differently: Take 325 mg by mouth every morning. 04/11/20  Yes Doree Fudge M, PA-C  atorvastatin (LIPITOR) 80 MG tablet Take 80 mg by mouth at bedtime. 03/09/20  Yes [provider]  Capsaicin-Menthol (SALONPAS GEL-PATCH HOT EX) Apply 1 patch topically 2 (two) times daily. Knee pain   Yes [provider]  cholecalciferol (VITAMIN D3) 25 MCG (1000 UNIT) tablet Take 1,000 Units by mouth 2 (two) times  daily.   Yes [provider]  digoxin (LANOXIN) 0.125 MG tablet Take 1 tablet (0.125 mg total) by mouth daily. Patient taking differently: Take 0.125 mg by mouth every morning. 05/29/20  Yes Marjie Skiff E, PA-C  ferrous sulfate 325 (65 FE) MG tablet Take 325 mg by mouth 2 (two) times daily. 03/24/20  Yes [provider]  Fluticasone-Salmeterol (ADVAIR) 250-50 MCG/DOSE AEPB Inhale 1 puff into the lungs in the morning and at bedtime. 01/27/20  Yes [provider]  furosemide (LASIX) 40 MG tablet Take 40 mg by mouth See admin instructions. Take one tablet (40 mg) by mouth when instructed to do so by MD - for shortness of breath/swelling/weight gain 05/13/20  Yes [provider]  gabapentin (NEURONTIN) 300 MG capsule Take 300 mg by mouth See admin instructions. Take one capsule (300 mg) by mouth twice daily - may also take 2 more times during the day as needed for pain 02/01/20  Yes [provider]  HYDROcodone-acetaminophen (NORCO) 10-325 MG tablet Take 1 tablet by mouth every 4 (four) hours as needed (pain).   Yes [provider]  insulin aspart (NOVOLOG) 100 UNIT/ML FlexPen Inject 7-14 Units into the skin in the morning and at bedtime. Dose based on CBG   Yes [provider]  insulin glargine (LANTUS) 100 UNIT/ML Solostar Pen Inject 24 Units into the skin 2 (two) times daily as needed (CBG >150).   Yes [provider]  metoprolol tartrate (LOPRESSOR) 25 MG tablet TAKE 1 AND 1/2 TABLETS TWICE DAILY Patient taking differently: Take 37.5 mg by mouth 2 (two) times daily. 08/18/20  Yes Azalee Course, PA  montelukast (SINGULAIR) 10 MG tablet Take 10 mg by mouth every morning. 01/05/20  Yes [provider]  omeprazole (PRILOSEC) 40 MG capsule Take 40 mg by mouth 2 (two) times daily. 08/03/20  Yes [provider]  OXYGEN Inhale into the lungs as needed (shortness of breath).   Yes [provider]  potassium chloride SA  (KLOR-CON) 20 MEQ tablet Take 1 tablet (20 mEq total) by mouth daily. Take only when you take your furosemide. Patient taking differently: Take 20 mEq by mouth See admin instructions. Take one tablet (20 meq) by mouth with each dose of furosemide - when instructed to do so by MD 05/09/20  Yes Marjie Skiff E, PA-C  valACYclovir (VALTREX) 500 MG tablet Take 500 mg by mouth 2 (two) times daily as needed (for flare ups).   Yes [provider]    Allergies    Peanut oil, Peanut-containing drug products, Povidone-iodine, Shellfish allergy, Statins, and Iodine  Review of Systems   Review of Systems  Constitutional: Negative for fever.  Respiratory: Positive for cough and shortness of breath.   Cardiovascular: Positive for chest pain and leg swelling.  Gastrointestinal: Negative for abdominal pain.  All other systems reviewed and are negative.   Physical Exam Updated Vital Signs BP 137/68 (BP Location: Right Wrist)   Pulse 64  Temp 98.8 F (37.1 C) (Oral)   Resp 18   Ht 5' (1.524 m)   Wt 100.8 kg   SpO2 97%   BMI 43.40 kg/m   Physical Exam Vitals and nursing note reviewed.  Constitutional:      General: She is not in acute distress.    Appearance: She is well-developed. She is obese. She is not ill-appearing or diaphoretic.  HENT:     Head: Normocephalic and atraumatic.     Right Ear: External ear normal.     Left Ear: External ear normal.     Nose: Nose normal.  Eyes:     General:        Right eye: No discharge.        Left eye: No discharge.  Cardiovascular:     Rate and Rhythm: Normal rate and regular rhythm.     Heart sounds: Murmur heard.    Pulmonary:     Effort: Pulmonary effort is normal.     Breath sounds: Normal breath sounds.  Abdominal:     Palpations: Abdomen is soft.     Tenderness: There is no abdominal tenderness.  Musculoskeletal:     Right lower leg: Edema present.     Comments: Swelling to dorsal right foot, maybe some in calf. No calf  tenderness. No redness  Skin:    General: Skin is warm and dry.  Neurological:     Mental Status: She is alert.  Psychiatric:        Mood and Affect: Mood is not anxious.     ED Results / Procedures / Treatments   Labs (all labs ordered are listed, but only abnormal results are displayed) Labs Reviewed  BASIC METABOLIC PANEL - Abnormal; Notable for the following components:      Result Value   Glucose, Bld 140 (*)    Calcium 8.8 (*)    All other components within normal limits  CBC - Abnormal; Notable for the following components:   RBC 3.61 (*)    Hemoglobin 9.2 (*)    HCT 30.2 (*)    MCH 25.5 (*)    RDW 18.6 (*)    All other components within normal limits  BRAIN NATRIURETIC PEPTIDE - Abnormal; Notable for the following components:   B Natriuretic Peptide 199.0 (*)    All other components within normal limits  TROPONIN I (HIGH SENSITIVITY) - Abnormal; Notable for the following components:   Troponin I (High Sensitivity) 18 (*)    All other components within normal limits  TROPONIN I (HIGH SENSITIVITY)    EKG EKG Interpretation  Date/Time:  Monday Aug 28 2020 11:10:37 EDT Ventricular Rate:  74 PR Interval:  134 QRS Duration: 78 QT Interval:  390 QTC Calculation: 432 R Axis:   9 Text Interpretation: Normal sinus rhythm Minimal voltage criteria for LVH, may be normal variant ( R in aVL ) Cannot rule out Anterior infarct , age undetermined Abnormal ECG overall similar to Jan 2022 Confirmed by Pricilla Loveless [phone]) on 08/28/2020 12:06:48 PM   Radiology DG Chest Port 1 View  Result Date: 08/28/2020 CLINICAL DATA:  65 year old female with history of chest pain and shortness of breath. EXAM: PORTABLE CHEST 1 VIEW COMPARISON:  Chest x-ray 08/24/2020 FINDINGS: Lung volumes are normal. No consolidative airspace disease. No pleural effusions. No pneumothorax. No evidence of pulmonary edema. No definite suspicious appearing pulmonary nodules or masses are noted. Moderate  cardiomegaly. Upper mediastinal contours are within normal limits. Aortic atherosclerosis. Status post median  sternotomy for CABG and aortic valve replacement. IMPRESSION: 1. No radiographic evidence of acute cardiopulmonary disease. 2. Aortic atherosclerosis. 3. Cardiomegaly. Electronically Signed   By: Trudie Reed M.D.   On: 08/28/2020 12:20   VAS [place] LOWER EXTREMITY VENOUS (DVT) (ONLY [facility] 7a-7p)  Result Date: 08/28/2020  Lower Venous DVT Study Patient Name:  KARENNA ROMANOFF  Date of Exam:   08/28/2020 Medical Rec #: [id number]       Accession #:    [id number] Date of Birth: [birth year]       Patient Gender: F Patient Age:   83Y Exam Location:  [facility] Procedure:      VAS [place] LOWER EXTREMITY VENOUS (DVT) Referring Phys: [id number] Bret Vanessen --------------------------------------------------------------------------------  Indications: Edema.  Limitations: Body habitus and poor ultrasound/tissue interface. Comparison Study: No prior study Performing Technologist: Gertie Fey MHA, RDMS, RVT, RDCS  Examination Guidelines: A complete evaluation includes B-mode imaging, spectral Doppler, color Doppler, and power Doppler as needed of all accessible portions of each vessel. Bilateral testing is considered an integral part of a complete examination. Limited examinations for reoccurring indications may be performed as noted. The reflux portion of the exam is performed with the patient in reverse Trendelenburg.  +---------+---------------+---------+-----------+----------+--------------+ RIGHT    CompressibilityPhasicitySpontaneityPropertiesThrombus Aging +---------+---------------+---------+-----------+----------+--------------+ FV Prox  Full                                                        +---------+---------------+---------+-----------+----------+--------------+ FV Mid   Full                                                         +---------+---------------+---------+-----------+----------+--------------+ FV DistalFull                                                        +---------+---------------+---------+-----------+----------+--------------+ PFV      Full                                                        +---------+---------------+---------+-----------+----------+--------------+ POP      Full           Yes      Yes                                 +---------+---------------+---------+-----------+----------+--------------+ PTV      Full                    Yes                                 +---------+---------------+---------+-----------+----------+--------------+ PERO     Full  Yes                                 +---------+---------------+---------+-----------+----------+--------------+   Right Technical Findings: Not visualized segments include CFV, SFJ.  +---------+---------------+---------+-----------+----------+--------------+ LEFT     CompressibilityPhasicitySpontaneityPropertiesThrombus Aging +---------+---------------+---------+-----------+----------+--------------+ FV Prox  Full                                                        +---------+---------------+---------+-----------+----------+--------------+ FV Mid   Full                                                        +---------+---------------+---------+-----------+----------+--------------+ FV Distal                        Yes                                 +---------+---------------+---------+-----------+----------+--------------+ PFV      Full                                                        +---------+---------------+---------+-----------+----------+--------------+ POP      Full           Yes      Yes                                 +---------+---------------+---------+-----------+----------+--------------+ PTV      Full                                                         +---------+---------------+---------+-----------+----------+--------------+ PERO     Full                                                        +---------+---------------+---------+-----------+----------+--------------+   Left Technical Findings: Not visualized segments include CFV, SFJ.   Summary: BILATERAL: -No evidence of popliteal cyst, bilaterally. RIGHT: - There is no evidence of deep vein thrombosis in the lower extremity. However, portions of this examination were limited- see technologist comments above.  LEFT: - There is no evidence of deep vein thrombosis in the lower extremity. However, portions of this examination were limited- see technologist comments above.  *See table(s) above for measurements and observations.    Preliminary     Procedures Procedures   Medications Ordered in ED Medications  aspirin chewable tablet 324 mg (324 mg Oral Given 08/28/20 1241)  furosemide (LASIX) tablet 40 mg (40 mg Oral Given  08/28/20 1514)    ED Course  I have reviewed the triage vital signs and the nursing notes.  Pertinent labs & imaging results that were available during my care of the patient were reviewed by me and considered in my medical decision making (see chart for details).    MDM Rules/Calculators/A&P                          Patient has been dealing with the symptoms for a few weeks.  Here her troponins are just slightly abnormal though not in the range that would be concerning for ACS.  They are also flat at 17 and 18.  My suspicion that she is having ACS is pretty low.  Her leg is a little swollen and though there is no DVTs perhaps she is developing a small amount of heart failure so we will give her a dose of Lasix in the ED.  Discussed with Dr. Anne Fu, given how long this has been going on she can follow-up closely with cardiology as she has already been doing.  Call the office tomorrow.  Doubt PE.  Will discharge home with return precautions.  She has  mild anemia that is a little lower than a few weeks ago but she seems to fluctuate and has no signs or symptoms of bleeding. Final Clinical Impression(s) / ED Diagnoses Final diagnoses:  Dyspnea, unspecified type  Nonspecific chest pain    Rx / DC Orders ED Discharge Orders    None       Pricilla Loveless, MD 08/28/20 1553

## 2020-08-28 NOTE — ED Triage Notes (Signed)
C/o SOB x 3 weeks.  Pain to center of chest x 1 week with intermittent dizziness.  R foot swelling since Friday.

## 2020-08-28 NOTE — Progress Notes (Signed)
Bilateral lower extremity venous duplex completed. Refer to "CV Proc" under chart review to view preliminary results.  08/28/2020 2:13 PM Eula Fried., MHA, RVT, RDCS, RDMS

## 2020-08-28 NOTE — Discharge Instructions (Addendum)
If you develop recurrent, continued, or worsening chest pain, shortness of breath, fever, vomiting, abdominal or back pain, or any other new/concerning symptoms then return to the ER for evaluation.  

## 2020-08-29 ENCOUNTER — Telehealth: Payer: Self-pay | Admitting: Cardiology

## 2020-08-29 NOTE — Telephone Encounter (Signed)
Spoke with pt, she was offered an appointment this week but she declined due to transportation. She has oxygen at home that she uses as needed that was given to her by a friend, she does not have a prescription. She is aware we can help with transportation if needed. Will forward to dr hochrein to review at patients request.

## 2020-08-29 NOTE — Telephone Encounter (Signed)
Spoke with pt, she was seen in the ER because with any exertion, walking, washing dishes or laundry she develops chest pain and SOB, the discomfort and SOB go away with rest. This has been going on for 3 weeks now and she reports it is getting to the point that it is the same as prior to her surgery. She also reports her right foot is swollen. She has not been allowed to exercise at cardiac rehab due to the pain and SOB. She was given 2 furosemide yesterday in the ER but she reports it did not help the swelling. She is currently at the pain doctors office and therefore I will call her back.

## 2020-08-29 NOTE — Telephone Encounter (Signed)
New Message:     Pt was seen in [facility] ER yesterday(08-28-20) abd was told to call Dr Hochrein's office this morning. Her discharge summary stated the same instructions.i

## 2020-09-03 NOTE — Progress Notes (Signed)
Cardiology Office Note   Date:  09/04/2020   ID:  Heidi Morse, DOB May 15, 1955, MRN [id number]  PCP:  Leola Brazil, DO  Cardiologist:   Rollene Rotunda, MD  Chief Complaint  Patient presents with  . Chest Pain  . Shortness of Breath      History of Present Illness: Heidi Morse is a 65 y.o. female who presents for evaluation CAD s/p CABG,aortic stenosiss/pAVR,postop atrial fibrillation, ischemic cardiomyopathy, PADs/pleft common femoral artery stenting 11/2017, bilateral carotid artery diseases/pstenting of left ICA, COPD,HTN,HLD, DM 2, and obstructive sleep apnea. The patient has had multiple stenting in the past. She had chest pain and had an endoscopy in December 2021 that showed bleeding AVMs. [facility] was[facility] and GI Bleed. She was seen at [facility] ED on 07/27/2019 due to persistent chest pain unrelieved by nitroglycerin. Work-up was unremarkable. She was feltto have noncardiac type chest discomfort. She presented to [facility] near the end of December for evaluation of chest pain again. Troponin was negative. Hemoglobin was 7.6 and the she was transfused with 1 pack of red blood cell. GI service was contacted who did not recommend any intervention. She subsequently underwent left heart cath that showed patent left main stent with distal moderate restenosis, haziness noted via ostial LAD suggesting moderate restenosis of the stent, severe restenosis of the ostial left circumflex stent. Echocardiogram showed EF 45 to 50%, mild LVH, grade 2 DD, mild to moderate AI, moderate aortic stenosis and moderate MR. She was seen by CT surgery and eventually underwent CABG x2 with LIMA to LAD and SVG to OM1 as well as AVR on 04/03/2020 by Dr. Cliffton Asters. Postop course was complicated by atrial fibrillation with RVR. 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 She was given digoxin and beta-blocker. She  subsequently converted back to sinus rhythm therefore was not started on anticoagulation therapy.   Recently she has had increased dyspnea and she had a work up with a hemoglobin was 10.4 which is not significantly different compared to her April lab work.  Renal function and electrolytes were normal.  BNP was only 49.6.  D-dimer 0.84 which was positive.  Echocardiogram obtained on 08/14/2020 showed EF 50 to 55%, mild LVH, grade 2 DD, elevated left atrial pressure, mildly reduced RV EF, RV systolic pressure 42.2 mmHg, mild to moderate MR, bioprosthetic aortic valve present with stable patient-prosthesis mismatch, mean gradient 16 mmHg.  No significant change when compared to the previous TTE on 05/23/2020.  No evidence of pericardial effusion to explain her symptoms.   VQ did not suggest a pulmonary embolism.   After this work up she was in the ED with calf swelling and she had no DVT.     She unfortunately is still getting significant symptoms.  Walked around the office today and in fact again found to the 80s and she developed chest discomfort.  We did repeat an EKG which demonstrated  No acute changes.  She thinks her symptoms are progressive.  Just walking from the car to the elevator today she had the symptoms.  She does not describe any resting chest pressure, neck or arm discomfort.  She is not describing PND or orthopnea.  She has not had any new palpitations, presyncope or syncope.  He does not wear oxygen very often.  She does not have a pulse oximeter or blood pressure machine.  She does use CPAP at night.  Of note she is going to schedule  an appointment [facility] for what sounds like a double-balloon enteroscopy as she is continuing to have slowly following hemoglobins with apparent iron deficiency.   Past Medical History:  Diagnosis Date  . Asthma   . Bilateral carotid artery stenosis   . CAD (coronary artery disease)    s/p CABG x2 in 04/2020  . CHF (congestive heart failure) (HCC)    . COPD (chronic obstructive pulmonary disease) (HCC)   . Diabetes mellitus without complication (HCC)   . Hypertension   . MI (myocardial infarction) (HCC)   . PAD (peripheral artery disease) (HCC)   . Sleep apnea     Past Surgical History:  Procedure Laterality Date  . AORTIC ROOT ENLARGEMENT N/A 04/03/2020   Procedure: AORTIC ROOT ENLARGEMENT USING HEMASHIELD PLATINUM WOVEN DOUBLE VELOUR VASCULAR GRAFT 28 MM X 30 CM;  Surgeon: Corliss SkainsLightfoot, Harrell O, MD;  [facility];  Service: Open Heart Surgery;  Laterality: N/A;  . AORTIC VALVE REPLACEMENT N/A 04/03/2020   Procedure: CORONARY ARTERY BYPASS GRAFTING X 2 ON CARDIOPULMONARY BYPASS. AORTIC VALVE REPLACEMENT USING 21 MM INSPIRIS RESILIA AORTIC VALVE, STERNAL PLATING;  Surgeon: Corliss SkainsLightfoot, Harrell O, MD;  Location: [facility] OR;  Service: Open Heart Surgery;  Laterality: N/A;  Coronary artery bypass grafting  Flow Trac  . ENDOVEIN HARVEST OF GREATER SAPHENOUS VEIN Right 04/03/2020   Procedure: ENDOVEIN HARVEST OF GREATER SAPHENOUS VEIN;  Surgeon: Corliss SkainsLightfoot, Harrell O, MD;  Location: [facility] OR;  Service: Open Heart Surgery;  Laterality: Right;  . LEFT HEART CATH AND CORONARY ANGIOGRAPHY N/A 03/30/2020   Procedure: LEFT HEART CATH AND CORONARY ANGIOGRAPHY;  Surgeon: Kathleene HazelMcAlhany, Christopher D, MD;  Location: [facility] INVASIVE CV LAB;  Service: Cardiovascular;  Laterality: N/A;  . TEE WITHOUT CARDIOVERSION N/A 04/03/2020   Procedure: TRANSESOPHAGEAL ECHOCARDIOGRAM (TEE);  Surgeon: Corliss SkainsLightfoot, Harrell O, MD;  [facility];  Service: Open Heart Surgery;  Laterality: N/A;     Current Outpatient Medications  Medication Sig Dispense Refill  . albuterol (VENTOLIN HFA) 108 (90 Base) MCG/ACT inhaler Inhale 2 puffs into the lungs every 6 (six) hours as needed for wheezing.    Marland Kitchen. aspirin EC 325 MG EC tablet Take 1 tablet (325 mg total) by mouth daily. (Patient taking differently: Take 325 mg by mouth every morning.) 30 tablet 0  . atorvastatin (LIPITOR) 80 MG tablet Take 80  mg by mouth at bedtime.    . Capsaicin-Menthol (SALONPAS GEL-PATCH HOT EX) Apply 1 patch topically 2 (two) times daily. Knee pain    . cholecalciferol (VITAMIN D3) 25 MCG (1000 UNIT) tablet Take 1,000 Units by mouth 2 (two) times daily.    . digoxin (LANOXIN) 0.125 MG tablet Take 1 tablet (0.125 mg total) by mouth daily. (Patient taking differently: Take 0.125 mg by mouth every morning.) 30 tablet 5  . ferrous sulfate 325 (65 FE) MG tablet Take 325 mg by mouth 2 (two) times daily.    . Fluticasone-Salmeterol (ADVAIR) 250-50 MCG/DOSE AEPB Inhale 1 puff into the lungs in the morning and at bedtime.    . furosemide (LASIX) 40 MG tablet Take 40 mg by mouth See admin instructions. Take one tablet (40 mg) by mouth when instructed to do so by MD - for shortness of breath/swelling/weight gain    . gabapentin (NEURONTIN) 300 MG capsule Take 300 mg by mouth See admin instructions. Take one capsule (300 mg) by mouth twice daily - may also take 2 more times during the day as needed for pain    .  HYDROcodone-acetaminophen (NORCO) 10-325 MG tablet Take 1 tablet by mouth every 4 (four) hours as needed (pain).    . insulin aspart (NOVOLOG) 100 UNIT/ML FlexPen Inject 7-14 Units into the skin in the morning and at bedtime. Dose based on CBG    . insulin glargine (LANTUS) 100 UNIT/ML Solostar Pen Inject 24 Units into the skin 2 (two) times daily as needed (CBG >150).    . metoprolol tartrate (LOPRESSOR) 25 MG tablet TAKE 1 AND 1/2 TABLETS TWICE DAILY (Patient taking differently: Take 37.5 mg by mouth 2 (two) times daily.) 90 tablet 3  . montelukast (SINGULAIR) 10 MG tablet Take 10 mg by mouth every morning.    Marland Kitchen omeprazole (PRILOSEC) 40 MG capsule Take 40 mg by mouth 2 (two) times daily.    . OXYGEN Inhale into the lungs as needed (shortness of breath).    . potassium chloride SA (KLOR-CON) 20 MEQ tablet Take 1 tablet (20 mEq total) by mouth daily. Take only when you take your furosemide. (Patient taking differently:  Take 20 mEq by mouth See admin instructions. Take one tablet (20 meq) by mouth with each dose of furosemide - when instructed to do so by MD) 30 tablet 0  . valACYclovir (VALTREX) 500 MG tablet Take 500 mg by mouth 2 (two) times daily as needed (for flare ups).     No current facility-administered medications for this visit.    Allergies:   Peanut oil, Peanut-containing drug products, Povidone-iodine, Shellfish allergy, Statins, and Iodine    ROS:  Please see the history of present illness.   Otherwise, review of systems are positive for none.   All other systems are reviewed and negative.    PHYSICAL EXAM: VS:  BP (!) 90/50 (BP Location: Left Arm, Patient Position: Sitting, Cuff Size: Large)   Pulse 64   Ht 5' (1.524 m)   Wt 228 lb (103.4 kg)   BMI 44.53 kg/m  , BMI Body mass index is 44.53 kg/m. GENERAL:  Well appearing NECK:  No jugular venous distention, waveform within normal limits, carotid upstroke brisk and symmetric, no bruits, no thyromegaly LUNGS:  Clear to auscultation bilaterally CHEST:  Well healed sternotomy scar. HEART:  PMI not displaced or sustained,S1 and S2 within normal limits, no S3, no S4, no clicks, no rubs, 2 out of 6 apical systolic murmur radiating slightly at the aortic outflow tract, no diastolic murmurs ABD:  Flat, positive bowel sounds normal in frequency in pitch, no bruits, no rebound, no guarding, no midline pulsatile mass, no hepatomegaly, no splenomegaly EXT:  2 plus pulses throughout, no edema, no cyanosis no clubbing, right femoral bruit   EKG:  EKG is ordered today. The ekg ordered today demonstrates sinus rhythm, rate 64, axis within normal limits, intervals within normal limits, lateral T wave inversions unchanged from previous.  Repeat EKG with chest pain demonstrated no acute ST segment changes.   Recent Labs: 03/29/2020: ALT 12 04/04/2020: Magnesium 2.5 08/28/2020: B Natriuretic Peptide 199.0; BUN 18; Creatinine, Ser 0.68; Hemoglobin 9.2;  Platelets 235; Potassium 3.8; Sodium 141    Lipid Panel No results found for: CHOL, TRIG, HDL, CHOLHDL, VLDL, LDLCALC, LDLDIRECT    Wt Readings from Last 3 Encounters:  09/04/20 228 lb (103.4 kg)  08/28/20 222 lb 3.6 oz (100.8 kg)  08/16/20 222 lb 3.2 oz (100.8 kg)      Other studies Reviewed: Additional studies/ records that were reviewed today include: Cath films from December images reviwed. . Review of the above records demonstrates:  Please see elsewhere in the note.    ASSESSMENT AND PLAN:  Symptomatic Anemia This was stable with Hgb of 9.2.  But this is falling slightly.  I encouraged her to look schedule her follow-up referral to [facility].  Chest pain She had chest pain walking around the office today.  I reviewed the cath films.  She had a high-grade tight ostial circumflex lesion as well as less obstructive lesions in the RCA and LAD.  I cannot exclude acute ischemia and in particular would wonder about the vein graft to the circumflex.  To further investigate this she would need left heart catheterization.  Because of the significant drop in her saturations I will check right heart pressures as well.  This may all be related to her anemia.  The patient understands that risks included but are not limited to stroke (1 in 1000), death (1 in 1000), kidney failure [usually temporary] (1 in 500), bleeding (1 in 200), allergic reaction [possibly serious] (1 in 200).  The patient understands and agrees to proceed.   Chronic systolic heart failure Her ejection fraction was improved.  BNP was mildly elevated.  However, I do not strongly suspect heart failure but will evaluate with right heart pressures as above.  HLD She will remain on the meds as listed and we will follow-up with fasting lipid profile in the future.  HTN Blood pressure is well controlled.  No change in therapy.  Dsypnea She has home O2 but is not using it frequently.  We will try to get her a pulse oximeter and  a blood pressure cuff to use.  Valvular disease Her aortic valve was functioning normally on the recent echo.  Again we will follow-up with right heart pressures.    Current medicines are reviewed at length with the patient today.  The patient does not have concerns regarding medicines.  The following changes have been made:  As above  Labs/ tests ordered today include:   Orders Placed This Encounter  Procedures  . CBC with Differential/Platelet  . EKG 12-Lead     Disposition:   FU with [place] Course after the cath.     Signed, Rollene Rotunda, MD  09/04/2020 1:12 PM    [facility]

## 2020-09-03 NOTE — H&P (View-Only) (Signed)
Cardiology Office Note   Date:  09/04/2020   ID:  Heidi Morse, DOB May 15, 1955, MRN [id number]  PCP:  Leola Brazil, DO  Cardiologist:   Rollene Rotunda, MD  Chief Complaint  Patient presents with  . Chest Pain  . Shortness of Breath      History of Present Illness: Heidi Morse is a 65 y.o. female who presents for evaluation CAD s/p CABG,aortic stenosiss/pAVR,postop atrial fibrillation, ischemic cardiomyopathy, PADs/pleft common femoral artery stenting 11/2017, bilateral carotid artery diseases/pstenting of left ICA, COPD,HTN,HLD, DM 2, and obstructive sleep apnea. The patient has had multiple stenting in the past. She had chest pain and had an endoscopy in December 2021 that showed bleeding AVMs. [facility] was[facility] and GI Bleed. She was seen at [facility] ED on 07/27/2019 due to persistent chest pain unrelieved by nitroglycerin. Work-up was unremarkable. She was feltto have noncardiac type chest discomfort. She presented to [facility] near the end of December for evaluation of chest pain again. Troponin was negative. Hemoglobin was 7.6 and the she was transfused with 1 pack of red blood cell. GI service was contacted who did not recommend any intervention. She subsequently underwent left heart cath that showed patent left main stent with distal moderate restenosis, haziness noted via ostial LAD suggesting moderate restenosis of the stent, severe restenosis of the ostial left circumflex stent. Echocardiogram showed EF 45 to 50%, mild LVH, grade 2 DD, mild to moderate AI, moderate aortic stenosis and moderate MR. She was seen by CT surgery and eventually underwent CABG x2 with LIMA to LAD and SVG to OM1 as well as AVR on 04/03/2020 by Dr. Cliffton Asters. Postop course was complicated by atrial fibrillation with RVR. 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 She was given digoxin and beta-blocker. She  subsequently converted back to sinus rhythm therefore was not started on anticoagulation therapy.   Recently she has had increased dyspnea and she had a work up with a hemoglobin was 10.4 which is not significantly different compared to her April lab work.  Renal function and electrolytes were normal.  BNP was only 49.6.  D-dimer 0.84 which was positive.  Echocardiogram obtained on 08/14/2020 showed EF 50 to 55%, mild LVH, grade 2 DD, elevated left atrial pressure, mildly reduced RV EF, RV systolic pressure 42.2 mmHg, mild to moderate MR, bioprosthetic aortic valve present with stable patient-prosthesis mismatch, mean gradient 16 mmHg.  No significant change when compared to the previous TTE on 05/23/2020.  No evidence of pericardial effusion to explain her symptoms.   VQ did not suggest a pulmonary embolism.   After this work up she was in the ED with calf swelling and she had no DVT.     She unfortunately is still getting significant symptoms.  Walked around the office today and in fact again found to the 80s and she developed chest discomfort.  We did repeat an EKG which demonstrated  No acute changes.  She thinks her symptoms are progressive.  Just walking from the car to the elevator today she had the symptoms.  She does not describe any resting chest pressure, neck or arm discomfort.  She is not describing PND or orthopnea.  She has not had any new palpitations, presyncope or syncope.  He does not wear oxygen very often.  She does not have a pulse oximeter or blood pressure machine.  She does use CPAP at night.  Of note she is going to schedule  an appointment to be seen at [facility] for what sounds like a double-balloon enteroscopy as she is continuing to have slowly following hemoglobins with apparent iron deficiency.   Past Medical History:  Diagnosis Date  . Asthma   . Bilateral carotid artery stenosis   . CAD (coronary artery disease)    s/p CABG x2 in 04/2020  . CHF (congestive heart failure) (HCC)    . COPD (chronic obstructive pulmonary disease) (HCC)   . Diabetes mellitus without complication (HCC)   . Hypertension   . MI (myocardial infarction) (HCC)   . PAD (peripheral artery disease) (HCC)   . Sleep apnea     Past Surgical History:  Procedure Laterality Date  . AORTIC ROOT ENLARGEMENT N/A 04/03/2020   Procedure: AORTIC ROOT ENLARGEMENT USING HEMASHIELD PLATINUM WOVEN DOUBLE VELOUR VASCULAR GRAFT 28 MM X 30 CM;  Surgeon: Lightfoot, Harrell O, MD;  Location: [facility] OR;  Service: Open Heart Surgery;  Laterality: N/A;  . AORTIC VALVE REPLACEMENT N/A 04/03/2020   Procedure: CORONARY ARTERY BYPASS GRAFTING X 2 ON CARDIOPULMONARY BYPASS. AORTIC VALVE REPLACEMENT USING 21 MM INSPIRIS RESILIA AORTIC VALVE, STERNAL PLATING;  Surgeon: Lightfoot, Harrell O, MD;  Location: [facility] OR;  Service: Open Heart Surgery;  Laterality: N/A;  Coronary artery bypass grafting  Flow Trac  . ENDOVEIN HARVEST OF GREATER SAPHENOUS VEIN Right 04/03/2020   Procedure: ENDOVEIN HARVEST OF GREATER SAPHENOUS VEIN;  Surgeon: Lightfoot, Harrell O, MD;  Location: [facility] OR;  Service: Open Heart Surgery;  Laterality: Right;  . LEFT HEART CATH AND CORONARY ANGIOGRAPHY N/A 03/30/2020   Procedure: LEFT HEART CATH AND CORONARY ANGIOGRAPHY;  Surgeon: McAlhany, Christopher D, MD;  Location: [facility] INVASIVE CV LAB;  Service: Cardiovascular;  Laterality: N/A;  . TEE WITHOUT CARDIOVERSION N/A 04/03/2020   Procedure: TRANSESOPHAGEAL ECHOCARDIOGRAM (TEE);  Surgeon: Lightfoot, Harrell O, MD;  Location: [facility] OR;  Service: Open Heart Surgery;  Laterality: N/A;     Current Outpatient Medications  Medication Sig Dispense Refill  . albuterol (VENTOLIN HFA) 108 (90 Base) MCG/ACT inhaler Inhale 2 puffs into the lungs every 6 (six) hours as needed for wheezing.    . aspirin EC 325 MG EC tablet Take 1 tablet (325 mg total) by mouth daily. (Patient taking differently: Take 325 mg by mouth every morning.) 30 tablet 0  . atorvastatin (LIPITOR) 80 MG tablet Take 80  mg by mouth at bedtime.    . Capsaicin-Menthol (SALONPAS GEL-PATCH HOT EX) Apply 1 patch topically 2 (two) times daily. Knee pain    . cholecalciferol (VITAMIN D3) 25 MCG (1000 UNIT) tablet Take 1,000 Units by mouth 2 (two) times daily.    . digoxin (LANOXIN) 0.125 MG tablet Take 1 tablet (0.125 mg total) by mouth daily. (Patient taking differently: Take 0.125 mg by mouth every morning.) 30 tablet 5  . ferrous sulfate 325 (65 FE) MG tablet Take 325 mg by mouth 2 (two) times daily.    . Fluticasone-Salmeterol (ADVAIR) 250-50 MCG/DOSE AEPB Inhale 1 puff into the lungs in the morning and at bedtime.    . furosemide (LASIX) 40 MG tablet Take 40 mg by mouth See admin instructions. Take one tablet (40 mg) by mouth when instructed to do so by MD - for shortness of breath/swelling/weight gain    . gabapentin (NEURONTIN) 300 MG capsule Take 300 mg by mouth See admin instructions. Take one capsule (300 mg) by mouth twice daily - may also take 2 more times during the day as needed for pain    .   HYDROcodone-acetaminophen (NORCO) 10-325 MG tablet Take 1 tablet by mouth every 4 (four) hours as needed (pain).    . insulin aspart (NOVOLOG) 100 UNIT/ML FlexPen Inject 7-14 Units into the skin in the morning and at bedtime. Dose based on CBG    . insulin glargine (LANTUS) 100 UNIT/ML Solostar Pen Inject 24 Units into the skin 2 (two) times daily as needed (CBG >150).    . metoprolol tartrate (LOPRESSOR) 25 MG tablet TAKE 1 AND 1/2 TABLETS TWICE DAILY (Patient taking differently: Take 37.5 mg by mouth 2 (two) times daily.) 90 tablet 3  . montelukast (SINGULAIR) 10 MG tablet Take 10 mg by mouth every morning.    Marland Kitchen omeprazole (PRILOSEC) 40 MG capsule Take 40 mg by mouth 2 (two) times daily.    . OXYGEN Inhale into the lungs as needed (shortness of breath).    . potassium chloride SA (KLOR-CON) 20 MEQ tablet Take 1 tablet (20 mEq total) by mouth daily. Take only when you take your furosemide. (Patient taking differently:  Take 20 mEq by mouth See admin instructions. Take one tablet (20 meq) by mouth with each dose of furosemide - when instructed to do so by MD) 30 tablet 0  . valACYclovir (VALTREX) 500 MG tablet Take 500 mg by mouth 2 (two) times daily as needed (for flare ups).     No current facility-administered medications for this visit.    Allergies:   Peanut oil, Peanut-containing drug products, Povidone-iodine, Shellfish allergy, Statins, and Iodine    ROS:  Please see the history of present illness.   Otherwise, review of systems are positive for none.   All other systems are reviewed and negative.    PHYSICAL EXAM: VS:  BP (!) 90/50 (BP Location: Left Arm, Patient Position: Sitting, Cuff Size: Large)   Pulse 64   Ht 5' (1.524 m)   Wt 228 lb (103.4 kg)   BMI 44.53 kg/m  , BMI Body mass index is 44.53 kg/m. GENERAL:  Well appearing NECK:  No jugular venous distention, waveform within normal limits, carotid upstroke brisk and symmetric, no bruits, no thyromegaly LUNGS:  Clear to auscultation bilaterally CHEST:  Well healed sternotomy scar. HEART:  PMI not displaced or sustained,S1 and S2 within normal limits, no S3, no S4, no clicks, no rubs, 2 out of 6 apical systolic murmur radiating slightly at the aortic outflow tract, no diastolic murmurs ABD:  Flat, positive bowel sounds normal in frequency in pitch, no bruits, no rebound, no guarding, no midline pulsatile mass, no hepatomegaly, no splenomegaly EXT:  2 plus pulses throughout, no edema, no cyanosis no clubbing, right femoral bruit   EKG:  EKG is ordered today. The ekg ordered today demonstrates sinus rhythm, rate 64, axis within normal limits, intervals within normal limits, lateral T wave inversions unchanged from previous.  Repeat EKG with chest pain demonstrated no acute ST segment changes.   Recent Labs: 03/29/2020: ALT 12 04/04/2020: Magnesium 2.5 08/28/2020: B Natriuretic Peptide 199.0; BUN 18; Creatinine, Ser 0.68; Hemoglobin 9.2;  Platelets 235; Potassium 3.8; Sodium 141    Lipid Panel No results found for: CHOL, TRIG, HDL, CHOLHDL, VLDL, LDLCALC, LDLDIRECT    Wt Readings from Last 3 Encounters:  09/04/20 228 lb (103.4 kg)  08/28/20 222 lb 3.6 oz (100.8 kg)  08/16/20 222 lb 3.2 oz (100.8 kg)      Other studies Reviewed: Additional studies/ records that were reviewed today include: Cath films from December images reviwed. . Review of the above records demonstrates:  Please see elsewhere in the note.    ASSESSMENT AND PLAN:  Symptomatic Anemia This was stable with Hgb of 9.2.  But this is falling slightly.  I encouraged her to look schedule her follow-up referral to [facility].  Chest pain She had chest pain walking around the office today.  I reviewed the cath films.  She had a high-grade tight ostial circumflex lesion as well as less obstructive lesions in the RCA and LAD.  I cannot exclude acute ischemia and in particular would wonder about the vein graft to the circumflex.  To further investigate this she would need left heart catheterization.  Because of the significant drop in her saturations I will check right heart pressures as well.  This may all be related to her anemia.  The patient understands that risks included but are not limited to stroke (1 in 1000), death (1 in 1000), kidney failure [usually temporary] (1 in 500), bleeding (1 in 200), allergic reaction [possibly serious] (1 in 200).  The patient understands and agrees to proceed.   Chronic systolic heart failure Her ejection fraction was improved.  BNP was mildly elevated.  However, I do not strongly suspect heart failure but will evaluate with right heart pressures as above.  HLD She will remain on the meds as listed and we will follow-up with fasting lipid profile in the future.  HTN Blood pressure is well controlled.  No change in therapy.  Dsypnea She has home O2 but is not using it frequently.  We will try to get her a pulse oximeter and  a blood pressure cuff to use.  Valvular disease Her aortic valve was functioning normally on the recent echo.  Again we will follow-up with right heart pressures.    Current medicines are reviewed at length with the patient today.  The patient does not have concerns regarding medicines.  The following changes have been made:  As above  Labs/ tests ordered today include:   Orders Placed This Encounter  Procedures  . CBC with Differential/Platelet  . EKG 12-Lead     Disposition:   FU with Azalee Course after the cath.     Signed, Rollene Rotunda, MD  09/04/2020 1:12 PM    [facility]

## 2020-09-04 ENCOUNTER — Other Ambulatory Visit: Payer: Self-pay

## 2020-09-04 ENCOUNTER — Encounter: Payer: Self-pay | Admitting: Cardiology

## 2020-09-04 ENCOUNTER — Ambulatory Visit (INDEPENDENT_AMBULATORY_CARE_PROVIDER_SITE_OTHER): Payer: Medicaid Other | Admitting: Cardiology

## 2020-09-04 VITALS — BP 90/50 | HR 64 | Ht 60.0 in | Wt 228.0 lb

## 2020-09-04 DIAGNOSIS — I2510 Atherosclerotic heart disease of native coronary artery without angina pectoris: Secondary | ICD-10-CM

## 2020-09-04 DIAGNOSIS — R0602 Shortness of breath: Secondary | ICD-10-CM | POA: Diagnosis not present

## 2020-09-04 DIAGNOSIS — I10 Essential (primary) hypertension: Secondary | ICD-10-CM | POA: Diagnosis not present

## 2020-09-04 DIAGNOSIS — I350 Nonrheumatic aortic (valve) stenosis: Secondary | ICD-10-CM | POA: Diagnosis not present

## 2020-09-04 LAB — CBC WITH DIFFERENTIAL/PLATELET
Basophils Absolute: 0 10*3/uL (ref 0.0–0.2)
Basos: 0 %
EOS (ABSOLUTE): 0.2 10*3/uL (ref 0.0–0.4)
Eos: 2 %
Hematocrit: 29.8 % — ABNORMAL LOW (ref 34.0–46.6)
Hemoglobin: 9.1 g/dL — ABNORMAL LOW (ref 11.1–15.9)
Immature Grans (Abs): 0 10*3/uL (ref 0.0–0.1)
Immature Granulocytes: 0 %
Lymphocytes Absolute: 1.9 10*3/uL (ref 0.7–3.1)
Lymphs: 20 %
MCH: 24.9 pg — ABNORMAL LOW (ref 26.6–33.0)
MCHC: 30.5 g/dL — ABNORMAL LOW (ref 31.5–35.7)
MCV: 82 fL (ref 79–97)
Monocytes Absolute: 0.8 10*3/uL (ref 0.1–0.9)
Monocytes: 9 %
Neutrophils Absolute: 6.7 10*3/uL (ref 1.4–7.0)
Neutrophils: 69 %
Platelets: 243 10*3/uL (ref 150–450)
RBC: 3.65 x10E6/uL — ABNORMAL LOW (ref 3.77–5.28)
RDW: 16.1 % — ABNORMAL HIGH (ref 11.7–15.4)
WBC: 9.8 10*3/uL (ref 3.4–10.8)

## 2020-09-04 NOTE — Patient Instructions (Signed)
Medication Instructions:  Your physician recommends that you continue on your current medications as directed. Please refer to the Current Medication list given to you today.   *If you need a refill on your cardiac medications before your next appointment, please call your pharmacy*  Lab Work: CBC TODAY  If you have labs (blood work) drawn today and your tests are completely normal, you will receive your results only by: Marland Kitchen MyChart Message (if you have MyChart) OR . A paper copy in the mail If you have any lab test that is abnormal or we need to change your treatment, we will call you to review the results.   Testing/Procedures: RIGHT AND LEFT HEART CATH   Follow-Up: At [facility], you and your health needs are our priority.  As part of our continuing mission to provide you with exceptional heart care, we have created designated Provider Care Teams.  These Care Teams include your primary Cardiologist (physician) and Advanced Practice Providers (APPs -  Physician Assistants and Nurse Practitioners) who all work together to provide you with the care you need, when you need it.  We recommend signing up for the patient portal called "MyChart".  Sign up information is provided on this After Visit Summary.  MyChart is used to connect with patients for Virtual Visits (Telemedicine).  Patients are able to view lab/test results, encounter notes, upcoming appointments, etc.  Non-urgent messages can be sent to your provider as well.   To learn more about what you can do with MyChart, go to [email or web address].    Your next appointment:   2 week(s) AFTER CATH 6/14  The format for your next appointment:   In Person  Provider:   [facility] PA OR DR [facility]   Other Instructions    [facility] CARDIOVASCULAR DIVISION [facility] [address] [place] [address] [place] [address] Dept: [phone] Loc: [phone]  Heidi Morse  09/04/2020  You are  scheduled for a Cardiac Catheterization on Tuesday, June 14 with Dr. Lance Muss.  1. Please arrive at the [facility] (Main Entrance A) at [facility]: [address] [place], [place] [address] at 5:30 AM (This time is two hours before your procedure to ensure your preparation). Free valet parking service is available.   Special note: Every effort is made to have your procedure done on time. Please understand that emergencies sometimes delay scheduled procedures.3  2. Diet: Do not eat solid foods after midnight.  The patient may have clear liquids until 5am upon the day of the procedure.  3. Labs: You will need to have blood drawn  today You do not need to be fasting.  4. Medication instructions in preparation for your procedure:   Contrast Allergy: No  DO NOT TAKE YOUR FUROSEMIDE MORNING OF PROCEDURE   TAKE 1/2 YOUR INSULIN DOSE NIGHT BEFORE, NONE MORNING OF    On the morning of your procedure, take your Aspirin and any morning medicines NOT listed above.  You may use sips of water.  5. Plan for one night stay--bring personal belongings. 6. Bring a current list of your medications and current insurance cards. 7. You MUST have a responsible person to drive you home. 8. Someone MUST be with you the first 24 hours after you arrive home or your discharge will be delayed. 9. Please wear clothes that are easy to get on and off and wear slip-on shoes.  Thank you for allowing [place] to care for you!   --  [facility]

## 2020-09-06 ENCOUNTER — Encounter: Payer: Self-pay | Admitting: *Deleted

## 2020-09-11 ENCOUNTER — Telehealth: Payer: Self-pay | Admitting: *Deleted

## 2020-09-11 NOTE — Telephone Encounter (Signed)
Pt contacted pre-catheterization scheduled at [facility] for: Tuesday September 12, 2020 7:30 AM Verified arrival time and place: [facility] Main Entrance A [facility]) at: 5:30 AM   No solid food after midnight prior to cath, clear liquids until 5 AM day of procedure.  Hold: Lasix/KCl-AM of procedure Insulin-AM of procedure/1/2 usual Insulin HS prior to procedure  Except hold medications AM meds can be  taken pre-cath with sips of water including: ASA 81 mg   Confirmed patient has responsible adult to drive home post procedure and be with patient first 24 hours after arriving home: yes  You are allowed ONE visitor in the waiting room during the time you are at the hospital for your procedure. Both you and your visitor must wear a mask once you enter the hospital.   Patient reports does not currently have any symptoms concerning for COVID-19 and no household members with COVID-19 like illness.        Reviewed procedure/mask/visitor instructions with patient.

## 2020-09-11 NOTE — Telephone Encounter (Signed)
Patient has iodine allergy listed-reports she was told by her dermatologist years ago glad they did not use iodine-she is unsure of any reaction she may have had. Patient reports has had previous cardiac caths with dye injections without reaction.

## 2020-09-12 ENCOUNTER — Other Ambulatory Visit: Payer: Self-pay

## 2020-09-12 ENCOUNTER — Encounter (HOSPITAL_COMMUNITY)
Admission: AD | Disposition: A | Discharge status: Home / Self Care | Payer: Self-pay | Source: Home / Self Care | Attending: Interventional Cardiology

## 2020-09-12 ENCOUNTER — Inpatient Hospital Stay (HOSPITAL_COMMUNITY)
Admission: AD | Admit: 2020-09-12 | Discharge: 2020-09-14 | DRG: 247 | Disposition: A | Discharge status: Home / Self Care | Payer: Medicaid Other | Attending: Cardiology | Admitting: Cardiology

## 2020-09-12 ENCOUNTER — Encounter (HOSPITAL_COMMUNITY): Payer: Self-pay | Admitting: Interventional Cardiology

## 2020-09-12 DIAGNOSIS — Z953 Presence of xenogenic heart valve: Secondary | ICD-10-CM

## 2020-09-12 DIAGNOSIS — I272 Pulmonary hypertension, unspecified: Secondary | ICD-10-CM | POA: Diagnosis present

## 2020-09-12 DIAGNOSIS — I5042 Chronic combined systolic (congestive) and diastolic (congestive) heart failure: Secondary | ICD-10-CM

## 2020-09-12 DIAGNOSIS — Z79899 Other long term (current) drug therapy: Secondary | ICD-10-CM

## 2020-09-12 DIAGNOSIS — D500 Iron deficiency anemia secondary to blood loss (chronic): Secondary | ICD-10-CM

## 2020-09-12 DIAGNOSIS — Z955 Presence of coronary angioplasty implant and graft: Secondary | ICD-10-CM

## 2020-09-12 DIAGNOSIS — Z888 Allergy status to other drugs, medicaments and biological substances status: Secondary | ICD-10-CM

## 2020-09-12 DIAGNOSIS — Z9981 Dependence on supplemental oxygen: Secondary | ICD-10-CM

## 2020-09-12 DIAGNOSIS — I25710 Atherosclerosis of autologous vein coronary artery bypass graft(s) with unstable angina pectoris: Secondary | ICD-10-CM | POA: Diagnosis present

## 2020-09-12 DIAGNOSIS — E119 Type 2 diabetes mellitus without complications: Secondary | ICD-10-CM

## 2020-09-12 DIAGNOSIS — I25708 Atherosclerosis of coronary artery bypass graft(s), unspecified, with other forms of angina pectoris: Secondary | ICD-10-CM | POA: Diagnosis not present

## 2020-09-12 DIAGNOSIS — I25810 Atherosclerosis of coronary artery bypass graft(s) without angina pectoris: Secondary | ICD-10-CM | POA: Diagnosis present

## 2020-09-12 DIAGNOSIS — I252 Old myocardial infarction: Secondary | ICD-10-CM

## 2020-09-12 DIAGNOSIS — Z7982 Long term (current) use of aspirin: Secondary | ICD-10-CM

## 2020-09-12 DIAGNOSIS — E669 Obesity, unspecified: Secondary | ICD-10-CM | POA: Diagnosis present

## 2020-09-12 DIAGNOSIS — Z6841 Body Mass Index (BMI) 40.0 and over, adult: Secondary | ICD-10-CM

## 2020-09-12 DIAGNOSIS — K219 Gastro-esophageal reflux disease without esophagitis: Secondary | ICD-10-CM | POA: Diagnosis present

## 2020-09-12 DIAGNOSIS — Z794 Long term (current) use of insulin: Secondary | ICD-10-CM

## 2020-09-12 DIAGNOSIS — I110 Hypertensive heart disease with heart failure: Secondary | ICD-10-CM | POA: Diagnosis present

## 2020-09-12 DIAGNOSIS — G4733 Obstructive sleep apnea (adult) (pediatric): Secondary | ICD-10-CM | POA: Diagnosis present

## 2020-09-12 DIAGNOSIS — T82855A Stenosis of coronary artery stent, initial encounter: Secondary | ICD-10-CM | POA: Diagnosis present

## 2020-09-12 DIAGNOSIS — Z7951 Long term (current) use of inhaled steroids: Secondary | ICD-10-CM

## 2020-09-12 DIAGNOSIS — E1151 Type 2 diabetes mellitus with diabetic peripheral angiopathy without gangrene: Secondary | ICD-10-CM | POA: Diagnosis present

## 2020-09-12 DIAGNOSIS — E1169 Type 2 diabetes mellitus with other specified complication: Secondary | ICD-10-CM

## 2020-09-12 DIAGNOSIS — I25110 Atherosclerotic heart disease of native coronary artery with unstable angina pectoris: Principal | ICD-10-CM | POA: Diagnosis present

## 2020-09-12 DIAGNOSIS — E785 Hyperlipidemia, unspecified: Secondary | ICD-10-CM | POA: Diagnosis present

## 2020-09-12 DIAGNOSIS — Z91010 Allergy to peanuts: Secondary | ICD-10-CM

## 2020-09-12 DIAGNOSIS — I255 Ischemic cardiomyopathy: Secondary | ICD-10-CM | POA: Diagnosis present

## 2020-09-12 DIAGNOSIS — K922 Gastrointestinal hemorrhage, unspecified: Secondary | ICD-10-CM

## 2020-09-12 DIAGNOSIS — I4891 Unspecified atrial fibrillation: Secondary | ICD-10-CM | POA: Diagnosis present

## 2020-09-12 DIAGNOSIS — E1142 Type 2 diabetes mellitus with diabetic polyneuropathy: Secondary | ICD-10-CM

## 2020-09-12 DIAGNOSIS — J449 Chronic obstructive pulmonary disease, unspecified: Secondary | ICD-10-CM | POA: Diagnosis present

## 2020-09-12 DIAGNOSIS — Z91013 Allergy to seafood: Secondary | ICD-10-CM

## 2020-09-12 HISTORY — PX: CORONARY STENT INTERVENTION: CATH118234

## 2020-09-12 HISTORY — PX: RIGHT/LEFT HEART CATH AND CORONARY/GRAFT ANGIOGRAPHY: CATH118267

## 2020-09-12 LAB — POCT I-STAT 7, (LYTES, BLD GAS, ICA,H+H)
Acid-Base Excess: 0 mmol/L (ref 0.0–2.0)
Bicarbonate: 25.9 mmol/L (ref 20.0–28.0)
Calcium, Ion: 1.14 mmol/L — ABNORMAL LOW (ref 1.15–1.40)
HCT: 28 % — ABNORMAL LOW (ref 36.0–46.0)
Hemoglobin: 9.5 g/dL — ABNORMAL LOW (ref 12.0–15.0)
O2 Saturation: 98 %
Potassium: 4.1 mmol/L (ref 3.5–5.1)
Sodium: 144 mmol/L (ref 135–145)
TCO2: 27 mmol/L (ref 22–32)
pCO2 arterial: 45.4 mmHg (ref 32.0–48.0)
pH, Arterial: 7.364 (ref 7.350–7.450)
pO2, Arterial: 102 mmHg (ref 83.0–108.0)

## 2020-09-12 LAB — POCT I-STAT EG7
Acid-Base Excess: 3 mmol/L — ABNORMAL HIGH (ref 0.0–2.0)
Bicarbonate: 28.3 mmol/L — ABNORMAL HIGH (ref 20.0–28.0)
Calcium, Ion: 1.19 mmol/L (ref 1.15–1.40)
HCT: 28 % — ABNORMAL LOW (ref 36.0–46.0)
Hemoglobin: 9.5 g/dL — ABNORMAL LOW (ref 12.0–15.0)
O2 Saturation: 73 %
Potassium: 4.2 mmol/L (ref 3.5–5.1)
Sodium: 143 mmol/L (ref 135–145)
TCO2: 30 mmol/L (ref 22–32)
pCO2, Ven: 47.4 mmHg (ref 44.0–60.0)
pH, Ven: 7.383 (ref 7.250–7.430)
pO2, Ven: 40 mmHg (ref 32.0–45.0)

## 2020-09-12 LAB — GLUCOSE, CAPILLARY
Glucose-Capillary: 121 mg/dL — ABNORMAL HIGH (ref 70–99)
Glucose-Capillary: 170 mg/dL — ABNORMAL HIGH (ref 70–99)
Glucose-Capillary: 315 mg/dL — ABNORMAL HIGH (ref 70–99)
Glucose-Capillary: 261 mg/dL — ABNORMAL HIGH (ref 70–99)

## 2020-09-12 LAB — POCT ACTIVATED CLOTTING TIME
Activated Clotting Time: 138 seconds
Activated Clotting Time: 242 seconds
Activated Clotting Time: 271 seconds

## 2020-09-12 LAB — HEMOGLOBIN A1C
Hgb A1c MFr Bld: 6.4 % — ABNORMAL HIGH (ref 4.8–5.6)
Mean Plasma Glucose: 137 mg/dL

## 2020-09-12 SURGERY — RIGHT/LEFT HEART CATH AND CORONARY/GRAFT ANGIOGRAPHY
Anesthesia: LOCAL

## 2020-09-12 MED ORDER — SODIUM CHLORIDE 0.9% FLUSH
3.0000 mL | Freq: Two times a day (BID) | INTRAVENOUS | Status: DC
  Administered 2020-09-12 – 2020-09-13 (×2): 3 mL via INTRAVENOUS

## 2020-09-12 MED ORDER — LABETALOL HCL 5 MG/ML IV SOLN: 10.0000 mg | INTRAVENOUS | Status: AC | PRN

## 2020-09-12 MED ORDER — HEPARIN (PORCINE) IN NACL 1000-0.9 UT/500ML-% IV SOLN
INTRAVENOUS | Status: AC
  Filled 2020-09-12: qty 1000

## 2020-09-12 MED ORDER — ATORVASTATIN CALCIUM 80 MG PO TABS
80.0000 mg | ORAL_TABLET | Freq: Every day | ORAL | Status: DC
  Administered 2020-09-12 – 2020-09-13 (×2): 80 mg via ORAL
  Filled 2020-09-12 (×2): qty 1

## 2020-09-12 MED ORDER — MOMETASONE FURO-FORMOTEROL FUM 200-5 MCG/ACT IN AERO
2.0000 | INHALATION_SPRAY | Freq: Two times a day (BID) | RESPIRATORY_TRACT | Status: DC
  Administered 2020-09-13 – 2020-09-14 (×3): 2 via RESPIRATORY_TRACT
  Filled 2020-09-12: qty 8.8

## 2020-09-12 MED ORDER — SODIUM CHLORIDE 0.9 % IV SOLN: 250.0000 mL | INTRAVENOUS | Status: DC | PRN

## 2020-09-12 MED ORDER — ALBUTEROL SULFATE (2.5 MG/3ML) 0.083% IN NEBU: 2.5000 mg | INHALATION_SOLUTION | Freq: Four times a day (QID) | RESPIRATORY_TRACT | Status: DC | PRN

## 2020-09-12 MED ORDER — DIGOXIN 125 MCG PO TABS
0.1250 mg | ORAL_TABLET | Freq: Every morning | ORAL | Status: DC
  Administered 2020-09-12 – 2020-09-14 (×3): 0.125 mg via ORAL
  Filled 2020-09-12 (×3): qty 1

## 2020-09-12 MED ORDER — MIDAZOLAM HCL 2 MG/2ML IJ SOLN
INTRAMUSCULAR | Status: AC
  Filled 2020-09-12: qty 2

## 2020-09-12 MED ORDER — FENTANYL CITRATE (PF) 100 MCG/2ML IJ SOLN
INTRAMUSCULAR | Status: DC | PRN
  Administered 2020-09-12 (×2): 25 ug via INTRAVENOUS

## 2020-09-12 MED ORDER — ASPIRIN 81 MG PO CHEW: 81.0000 mg | CHEWABLE_TABLET | ORAL | Status: DC

## 2020-09-12 MED ORDER — SODIUM CHLORIDE 0.9% FLUSH: 3.0000 mL | INTRAVENOUS | Status: DC | PRN

## 2020-09-12 MED ORDER — CLOPIDOGREL BISULFATE 75 MG PO TABS
75.0000 mg | ORAL_TABLET | Freq: Every day | ORAL | Status: DC
  Administered 2020-09-13 – 2020-09-14 (×2): 75 mg via ORAL
  Filled 2020-09-12 (×2): qty 1

## 2020-09-12 MED ORDER — DIPHENHYDRAMINE HCL 50 MG/ML IJ SOLN: 25.0000 mg | Freq: Once | INTRAMUSCULAR | Status: AC

## 2020-09-12 MED ORDER — ACETAMINOPHEN 325 MG PO TABS: 650.0000 mg | ORAL_TABLET | ORAL | Status: DC | PRN

## 2020-09-12 MED ORDER — LIDOCAINE HCL (PF) 1 % IJ SOLN
INTRAMUSCULAR | Status: DC | PRN
  Administered 2020-09-12 (×2): 2 mL

## 2020-09-12 MED ORDER — GABAPENTIN 300 MG PO CAPS
300.0000 mg | ORAL_CAPSULE | Freq: Two times a day (BID) | ORAL | Status: DC
  Administered 2020-09-12 – 2020-09-14 (×5): 300 mg via ORAL
  Filled 2020-09-12 (×5): qty 1

## 2020-09-12 MED ORDER — HEPARIN (PORCINE) IN NACL 1000-0.9 UT/500ML-% IV SOLN
INTRAVENOUS | Status: DC | PRN
  Administered 2020-09-12 (×2): 500 mL

## 2020-09-12 MED ORDER — HEPARIN SODIUM (PORCINE) 1000 UNIT/ML IJ SOLN
INTRAMUSCULAR | Status: AC
  Filled 2020-09-12: qty 1

## 2020-09-12 MED ORDER — LIDOCAINE HCL (PF) 1 % IJ SOLN
INTRAMUSCULAR | Status: AC
  Filled 2020-09-12: qty 30

## 2020-09-12 MED ORDER — INSULIN ASPART 100 UNIT/ML IJ SOLN
0.0000 [IU] | Freq: Three times a day (TID) | INTRAMUSCULAR | Status: DC
  Administered 2020-09-12: 3 [IU] via SUBCUTANEOUS
  Administered 2020-09-12: 11 [IU] via SUBCUTANEOUS
  Administered 2020-09-13 – 2020-09-14 (×4): 3 [IU] via SUBCUTANEOUS

## 2020-09-12 MED ORDER — SODIUM CHLORIDE 0.9% FLUSH
3.0000 mL | Freq: Two times a day (BID) | INTRAVENOUS | Status: DC
  Administered 2020-09-12 – 2020-09-14 (×4): 3 mL via INTRAVENOUS

## 2020-09-12 MED ORDER — INSULIN ASPART 100 UNIT/ML IJ SOLN
0.0000 [IU] | Freq: Every day | INTRAMUSCULAR | Status: DC
  Administered 2020-09-13: 3 [IU] via SUBCUTANEOUS

## 2020-09-12 MED ORDER — MONTELUKAST SODIUM 10 MG PO TABS
10.0000 mg | ORAL_TABLET | Freq: Every morning | ORAL | Status: DC
  Administered 2020-09-12 – 2020-09-14 (×3): 10 mg via ORAL
  Filled 2020-09-12 (×3): qty 1

## 2020-09-12 MED ORDER — HYDROCODONE-ACETAMINOPHEN 10-325 MG PO TABS
1.0000 | ORAL_TABLET | ORAL | Status: DC | PRN
  Administered 2020-09-12 (×2): 1 via ORAL
  Filled 2020-09-12 (×2): qty 1

## 2020-09-12 MED ORDER — METOPROLOL TARTRATE 25 MG PO TABS
37.5000 mg | ORAL_TABLET | Freq: Two times a day (BID) | ORAL | Status: DC
  Administered 2020-09-12 – 2020-09-14 (×4): 37.5 mg via ORAL
  Filled 2020-09-12 (×5): qty 1

## 2020-09-12 MED ORDER — VERAPAMIL HCL 2.5 MG/ML IV SOLN
INTRAVENOUS | Status: DC | PRN
  Administered 2020-09-12: 10 mL via INTRA_ARTERIAL

## 2020-09-12 MED ORDER — CLOPIDOGREL BISULFATE 300 MG PO TABS
ORAL_TABLET | ORAL | Status: DC | PRN
Start: 2020-09-12 — End: 2020-09-12
  Administered 2020-09-12: 600 mg via ORAL

## 2020-09-12 MED ORDER — FERROUS SULFATE 325 (65 FE) MG PO TABS
325.0000 mg | ORAL_TABLET | Freq: Two times a day (BID) | ORAL | Status: DC
  Administered 2020-09-12 – 2020-09-14 (×5): 325 mg via ORAL
  Filled 2020-09-12 (×5): qty 1

## 2020-09-12 MED ORDER — VERAPAMIL HCL 2.5 MG/ML IV SOLN
INTRAVENOUS | Status: AC
  Filled 2020-09-12: qty 2

## 2020-09-12 MED ORDER — MIDAZOLAM HCL 2 MG/2ML IJ SOLN
INTRAMUSCULAR | Status: DC | PRN
  Administered 2020-09-12 (×3): 1 mg via INTRAVENOUS

## 2020-09-12 MED ORDER — METHYLPREDNISOLONE SODIUM SUCC 125 MG IJ SOLR: 125.0000 mg | Freq: Once | INTRAMUSCULAR | Status: AC

## 2020-09-12 MED ORDER — ONDANSETRON HCL 4 MG/2ML IJ SOLN: 4.0000 mg | Freq: Four times a day (QID) | INTRAMUSCULAR | Status: DC | PRN

## 2020-09-12 MED ORDER — VITAMIN D 25 MCG (1000 UNIT) PO TABS
1000.0000 [IU] | ORAL_TABLET | Freq: Two times a day (BID) | ORAL | Status: DC
  Administered 2020-09-12 – 2020-09-14 (×5): 1000 [IU] via ORAL
  Filled 2020-09-12 (×5): qty 1

## 2020-09-12 MED ORDER — CAPSAICIN-MENTHOL 0.025-1.25 % EX PTCH: MEDICATED_PATCH | Freq: Two times a day (BID) | CUTANEOUS | Status: DC

## 2020-09-12 MED ORDER — HEPARIN SODIUM (PORCINE) 1000 UNIT/ML IJ SOLN
INTRAMUSCULAR | Status: DC | PRN
  Administered 2020-09-12: 5000 [IU] via INTRAVENOUS
  Administered 2020-09-12: 6000 [IU] via INTRAVENOUS
  Administered 2020-09-12: 4000 [IU] via INTRAVENOUS

## 2020-09-12 MED ORDER — SODIUM CHLORIDE 0.9 % WEIGHT BASED INFUSION
3.0000 mL/kg/h | INTRAVENOUS | Status: DC
  Administered 2020-09-12: 3 mL/kg/h via INTRAVENOUS

## 2020-09-12 MED ORDER — SODIUM CHLORIDE 0.9 % IV SOLN: INTRAVENOUS | Status: AC

## 2020-09-12 MED ORDER — HYDRALAZINE HCL 20 MG/ML IJ SOLN: 10.0000 mg | INTRAMUSCULAR | Status: AC | PRN

## 2020-09-12 MED ORDER — DIPHENHYDRAMINE HCL 50 MG/ML IJ SOLN
INTRAMUSCULAR | Status: AC
  Administered 2020-09-12: 25 mg via INTRAVENOUS
  Filled 2020-09-12: qty 1

## 2020-09-12 MED ORDER — FENTANYL CITRATE (PF) 100 MCG/2ML IJ SOLN
INTRAMUSCULAR | Status: AC
  Filled 2020-09-12: qty 2

## 2020-09-12 MED ORDER — METHYLPREDNISOLONE SODIUM SUCC 125 MG IJ SOLR
INTRAMUSCULAR | Status: AC
  Administered 2020-09-12: 125 mg via INTRAVENOUS
  Filled 2020-09-12: qty 2

## 2020-09-12 MED ORDER — SODIUM CHLORIDE 0.9 % WEIGHT BASED INFUSION: 1.0000 mL/kg/h | INTRAVENOUS | Status: DC

## 2020-09-12 MED ORDER — CLOPIDOGREL BISULFATE 300 MG PO TABS
ORAL_TABLET | ORAL | Status: AC
  Filled 2020-09-12: qty 2

## 2020-09-12 SURGICAL SUPPLY — 25 items
BALLN SAPPHIRE 2.5X15 (BALLOONS) ×2
BALLN SAPPHIRE ~~LOC~~ 4.0X12 (BALLOONS) ×2 IMPLANT
BALLOON SAPPHIRE 2.5X15 (BALLOONS) ×1 IMPLANT
CATH BALLN WEDGE 5F 110CM (CATHETERS) ×2 IMPLANT
CATH INFINITI 5 FR 3DRC (CATHETERS) ×2 IMPLANT
CATH INFINITI 5 FR AR1 MOD (CATHETERS) ×2 IMPLANT
CATH INFINITI 5FR AL1 (CATHETERS) ×2 IMPLANT
CATH INFINITI 5FR MULTPACK ANG (CATHETERS) ×2 IMPLANT
CATH LAUNCHER 6FR AL.75 (CATHETERS) ×2 IMPLANT
CATH VISTA GUIDE 6FR AL1 (CATHETERS) ×2 IMPLANT
DEVICE RAD COMP TR BAND LRG (VASCULAR PRODUCTS) ×2 IMPLANT
GLIDESHEATH SLEND SS 6F .021 (SHEATH) ×2 IMPLANT
GUIDEWIRE INQWIRE 1.5J.035X260 (WIRE) ×1 IMPLANT
INQWIRE 1.5J .035X260CM (WIRE) ×2
KIT ENCORE 26 ADVANTAGE (KITS) ×2 IMPLANT
KIT HEART LEFT (KITS) ×2 IMPLANT
PACK CARDIAC CATHETERIZATION (CUSTOM PROCEDURE TRAY) ×2 IMPLANT
SHEATH GLIDE SLENDER 4/5FR (SHEATH) ×2 IMPLANT
SHEATH PROBE COVER 6X72 (BAG) ×2 IMPLANT
STENT SYNERGY XD 3.50X20 (Permanent Stent) ×1 IMPLANT
SYNERGY XD 3.50X20 (Permanent Stent) ×2 IMPLANT
TRANSDUCER W/STOPCOCK (MISCELLANEOUS) ×2 IMPLANT
TUBING CIL FLEX 10 FLL-RA (TUBING) ×2 IMPLANT
VALVE COPILOT STAT (MISCELLANEOUS) ×2 IMPLANT
WIRE ASAHI PROWATER 180CM (WIRE) ×2 IMPLANT

## 2020-09-12 NOTE — Progress Notes (Signed)
Patient's pm blood pressure is 92/58.  On-call MD notified.  MD states ok to hold pm dose of metoprolol. Will continue to monitor.   Darrick Grinder, RN

## 2020-09-12 NOTE — Interval H&P Note (Signed)
Cath Lab Visit (complete for each Cath Lab visit)  Clinical Evaluation Leading to the Procedure:   ACS: No.  Non-ACS:    Anginal Classification: CCS III  Anti-ischemic medical therapy: Minimal Therapy (1 class of medications)  Non-Invasive Test Results: No non-invasive testing performed  Prior CABG: No previous CABG      History and Physical Interval Note:  09/12/2020 8:01 AM  Heidi Morse  has presented today for surgery, with the diagnosis of chest pain.  The various methods of treatment have been discussed with the patient and family. After consideration of risks, benefits and other options for treatment, the patient has consented to  Procedure(s): RIGHT/LEFT HEART CATH AND CORONARY/GRAFT ANGIOGRAPHY (N/A) as a surgical intervention.  The patient's history has been reviewed, patient examined, no change in status, stable for surgery.  I have reviewed the patient's chart and labs.  Questions were answered to the patient's satisfaction.     Lance Muss

## 2020-09-13 DIAGNOSIS — D500 Iron deficiency anemia secondary to blood loss (chronic): Secondary | ICD-10-CM | POA: Diagnosis present

## 2020-09-13 DIAGNOSIS — I25700 Atherosclerosis of coronary artery bypass graft(s), unspecified, with unstable angina pectoris: Secondary | ICD-10-CM | POA: Diagnosis not present

## 2020-09-13 DIAGNOSIS — Z953 Presence of xenogenic heart valve: Secondary | ICD-10-CM | POA: Diagnosis not present

## 2020-09-13 DIAGNOSIS — Z7982 Long term (current) use of aspirin: Secondary | ICD-10-CM | POA: Diagnosis not present

## 2020-09-13 DIAGNOSIS — E7800 Pure hypercholesterolemia, unspecified: Secondary | ICD-10-CM | POA: Diagnosis not present

## 2020-09-13 DIAGNOSIS — Z95828 Presence of other vascular implants and grafts: Secondary | ICD-10-CM | POA: Diagnosis not present

## 2020-09-13 DIAGNOSIS — Z7951 Long term (current) use of inhaled steroids: Secondary | ICD-10-CM | POA: Diagnosis not present

## 2020-09-13 DIAGNOSIS — Z794 Long term (current) use of insulin: Secondary | ICD-10-CM | POA: Diagnosis not present

## 2020-09-13 DIAGNOSIS — G4733 Obstructive sleep apnea (adult) (pediatric): Secondary | ICD-10-CM | POA: Diagnosis present

## 2020-09-13 DIAGNOSIS — J449 Chronic obstructive pulmonary disease, unspecified: Secondary | ICD-10-CM | POA: Diagnosis present

## 2020-09-13 DIAGNOSIS — K922 Gastrointestinal hemorrhage, unspecified: Secondary | ICD-10-CM | POA: Diagnosis present

## 2020-09-13 DIAGNOSIS — I25110 Atherosclerotic heart disease of native coronary artery with unstable angina pectoris: Secondary | ICD-10-CM | POA: Diagnosis present

## 2020-09-13 DIAGNOSIS — Z951 Presence of aortocoronary bypass graft: Secondary | ICD-10-CM | POA: Diagnosis not present

## 2020-09-13 DIAGNOSIS — Z952 Presence of prosthetic heart valve: Secondary | ICD-10-CM | POA: Diagnosis not present

## 2020-09-13 DIAGNOSIS — I25710 Atherosclerosis of autologous vein coronary artery bypass graft(s) with unstable angina pectoris: Secondary | ICD-10-CM | POA: Diagnosis present

## 2020-09-13 DIAGNOSIS — K219 Gastro-esophageal reflux disease without esophagitis: Secondary | ICD-10-CM | POA: Diagnosis present

## 2020-09-13 DIAGNOSIS — E1169 Type 2 diabetes mellitus with other specified complication: Secondary | ICD-10-CM | POA: Diagnosis present

## 2020-09-13 DIAGNOSIS — Z9981 Dependence on supplemental oxygen: Secondary | ICD-10-CM | POA: Diagnosis not present

## 2020-09-13 DIAGNOSIS — I5042 Chronic combined systolic (congestive) and diastolic (congestive) heart failure: Secondary | ICD-10-CM | POA: Diagnosis present

## 2020-09-13 DIAGNOSIS — E669 Obesity, unspecified: Secondary | ICD-10-CM | POA: Diagnosis present

## 2020-09-13 DIAGNOSIS — I252 Old myocardial infarction: Secondary | ICD-10-CM | POA: Diagnosis not present

## 2020-09-13 DIAGNOSIS — I255 Ischemic cardiomyopathy: Secondary | ICD-10-CM

## 2020-09-13 DIAGNOSIS — I4891 Unspecified atrial fibrillation: Secondary | ICD-10-CM | POA: Diagnosis present

## 2020-09-13 DIAGNOSIS — E785 Hyperlipidemia, unspecified: Secondary | ICD-10-CM | POA: Diagnosis present

## 2020-09-13 DIAGNOSIS — I110 Hypertensive heart disease with heart failure: Secondary | ICD-10-CM | POA: Diagnosis present

## 2020-09-13 DIAGNOSIS — I272 Pulmonary hypertension, unspecified: Secondary | ICD-10-CM | POA: Diagnosis present

## 2020-09-13 DIAGNOSIS — Z6841 Body Mass Index (BMI) 40.0 and over, adult: Secondary | ICD-10-CM | POA: Diagnosis not present

## 2020-09-13 DIAGNOSIS — E1151 Type 2 diabetes mellitus with diabetic peripheral angiopathy without gangrene: Secondary | ICD-10-CM | POA: Diagnosis present

## 2020-09-13 DIAGNOSIS — T82855A Stenosis of coronary artery stent, initial encounter: Secondary | ICD-10-CM | POA: Diagnosis present

## 2020-09-13 DIAGNOSIS — I25810 Atherosclerosis of coronary artery bypass graft(s) without angina pectoris: Secondary | ICD-10-CM | POA: Diagnosis present

## 2020-09-13 LAB — BASIC METABOLIC PANEL
Anion gap: 10 (ref 5–15)
BUN: 26 mg/dL — ABNORMAL HIGH (ref 8–23)
CO2: 23 mmol/L (ref 22–32)
Calcium: 8.5 mg/dL — ABNORMAL LOW (ref 8.9–10.3)
Chloride: 108 mmol/L (ref 98–111)
Creatinine, Ser: 0.81 mg/dL (ref 0.44–1.00)
GFR, Estimated: >60 mL/min (ref >60)
Glucose, Bld: 241 mg/dL — ABNORMAL HIGH (ref 70–99)
Potassium: 4.4 mmol/L (ref 3.5–5.1)
Sodium: 141 mmol/L (ref 135–145)

## 2020-09-13 LAB — CBC
HCT: 25.9 % — ABNORMAL LOW (ref 36.0–46.0)
Hemoglobin: 7.7 g/dL — ABNORMAL LOW (ref 12.0–15.0)
MCH: 25 pg — ABNORMAL LOW (ref 26.0–34.0)
MCHC: 29.7 g/dL — ABNORMAL LOW (ref 30.0–36.0)
MCV: 84.1 fL (ref 80.0–100.0)
Platelets: 225 10*3/uL (ref 150–400)
RBC: 3.08 MIL/uL — ABNORMAL LOW (ref 3.87–5.11)
RDW: 18.1 % — ABNORMAL HIGH (ref 11.5–15.5)
WBC: 13.8 10*3/uL — ABNORMAL HIGH (ref 4.0–10.5)
nRBC: 0 % (ref 0.0–0.2)

## 2020-09-13 LAB — HEMOGLOBIN AND HEMATOCRIT, BLOOD
HCT: 28.8 % — ABNORMAL LOW (ref 36.0–46.0)
Hemoglobin: 8.9 g/dL — ABNORMAL LOW (ref 12.0–15.0)

## 2020-09-13 LAB — GLUCOSE, CAPILLARY
Glucose-Capillary: 158 mg/dL — ABNORMAL HIGH (ref 70–99)
Glucose-Capillary: 160 mg/dL — ABNORMAL HIGH (ref 70–99)
Glucose-Capillary: 172 mg/dL — ABNORMAL HIGH (ref 70–99)
Glucose-Capillary: 178 mg/dL — ABNORMAL HIGH (ref 70–99)
Glucose-Capillary: 255 mg/dL — ABNORMAL HIGH (ref 70–99)

## 2020-09-13 LAB — LIPID PANEL
Cholesterol: 155 mg/dL (ref 0–200)
HDL: 43 mg/dL (ref >40)
LDL Cholesterol: 100 mg/dL — ABNORMAL HIGH (ref 0–99)
Total CHOL/HDL Ratio: 3.6 RATIO
Triglycerides: 60 mg/dL (ref <150)
VLDL: 12 mg/dL (ref 0–40)

## 2020-09-13 LAB — PREPARE RBC (CROSSMATCH)

## 2020-09-13 MED ORDER — SODIUM CHLORIDE 0.9% IV SOLUTION: Freq: Once | INTRAVENOUS | Status: AC

## 2020-09-13 MED ORDER — DIPHENHYDRAMINE HCL 25 MG PO CAPS
25.0000 mg | ORAL_CAPSULE | Freq: Once | ORAL | Status: AC
  Administered 2020-09-13: 25 mg via ORAL
  Filled 2020-09-13: qty 1

## 2020-09-13 NOTE — Progress Notes (Signed)
Progress Note  Patient Name: Heidi Morse Date of Encounter: 09/13/2020  [facility] Cardiologist: Rollene Rotunda, MD   Subjective   Patient reports feeling fine this morning. No recurrent chest pain, though activity has been limited and she has only ambulated to the bathroom and back to bed. No complaints of SOB or palpitations. Left radial cath site with some bruising but no hematoma.   Per Cardiac rehab after my evaluation, patient ambulated down the hall and reported recurrent 8/10 chest pain, similar to what she was experiencing at home. HR was noted to be up to 130s at that time. Chest pain resolved and HR returned to normal upon returning to bedside chair.   Inpatient Medications    Scheduled Meds:  atorvastatin  80 mg Oral QHS   cholecalciferol  1,000 Units Oral BID   clopidogrel  75 mg Oral Q breakfast   digoxin  0.125 mg Oral q morning   ferrous sulfate  325 mg Oral BID   gabapentin  300 mg Oral BID   insulin aspart  0-15 Units Subcutaneous TID WC   insulin aspart  0-5 Units Subcutaneous QHS   metoprolol tartrate  37.5 mg Oral BID   mometasone-formoterol  2 puff Inhalation BID   montelukast  10 mg Oral q morning   sodium chloride flush  3 mL Intravenous Q12H   sodium chloride flush  3 mL Intravenous Q12H   Continuous Infusions:  sodium chloride     PRN Meds: sodium chloride, acetaminophen, albuterol, HYDROcodone-acetaminophen, ondansetron (ZOFRAN) IV, sodium chloride flush   Vital Signs    Vitals:   09/12/20 2033 09/13/20 0017 09/13/20 0050 09/13/20 0537  BP: (!) 92/58 (!) 71/41 (!) 118/52 (!) 129/50  Pulse: 76 66 71 66  Resp: 17 16 16 19   Temp: 98 F (36.7 C) 98.2 F (36.8 C)  97.8 F (36.6 C)  TempSrc: Oral Oral  Oral  SpO2: 98% 96% 97% 98%  Weight:      Height:        Intake/Output Summary (Last 24 hours) at 09/13/2020 0749 Last data filed at 09/12/2020 2230 Gross per 24 hour  Intake 640.75 ml  Output 400 ml  Net 240.75 ml   Last 3  Weights 09/12/2020 09/04/2020 08/28/2020  Weight (lbs) 227 lb 228 lb 222 lb 3.6 oz  Weight (kg) 102.967 kg 103.42 kg 100.8 kg      Telemetry    Sinus rhythm with occasional PVCs - Personally Reviewed  ECG    Sinus rhythm, rate 63 bpm, non-specific T wave abnormalities, no STE/D. - Personally Reviewed  Physical Exam   GEN: No acute distress.   Neck: No JVD Cardiac: RRR, + murmur, no rubs or gallops.  Respiratory: Clear to auscultation bilaterally. GI: Soft, obese, nontender, non-distended  MS: No edema; No deformity. Neuro:  Nonfocal  Psych: Normal affect   Labs    High Sensitivity Troponin:   Recent Labs  Lab 08/28/20 1113 08/28/20 1332  TROPONINIHS 17 18*      Chemistry Recent Labs  Lab 09/12/20 0822 09/12/20 0827 09/13/20 0153  NA 144 143 141  K 4.1 4.2 4.4  CL  --   --  108  CO2  --   --  23  GLUCOSE  --   --  241*  BUN  --   --  26*  CREATININE  --   --  0.81  CALCIUM  --   --  8.5*  GFRNONAA  --   --  >  60  ANIONGAP  --   --  10     Hematology Recent Labs  Lab 09/12/20 0822 09/12/20 0827 09/13/20 0153  WBC  --   --  13.8*  RBC  --   --  3.08*  HGB 9.5* 9.5* 7.7*  HCT 28.0* 28.0* 25.9*  MCV  --   --  84.1  MCH  --   --  25.0*  MCHC  --   --  29.7*  RDW  --   --  18.1*  PLT  --   --  225    BNPNo results for input(s): BNP, PROBNP in the last 168 hours.   DDimer No results for input(s): DDIMER in the last 168 hours.   Radiology    CARDIAC CATHETERIZATION  Result Date: 09/12/2020  Dist LM to Ost LAD lesion is 60% stenosed. LIMA to LAD is widely patent.  Prox RCA to Mid RCA lesion is 40% stenosed. Could not selectively engage from the left radial but appeared patent.  Ost Cx lesion is 99% stenosed. In stent restenosis.  Origin lesion is 95% stenosed in the SVG to OM. A drug-eluting stent was successfully placed using a SYNERGY XD 3.50X20, postdilated to > 4 mm.  Post intervention, there is a 0% residual stenosis.  Hemodynamic findings  consistent with mild pulmonary hypertension.  Ao sat 98%; PA sat 71%; PA pressure 40/15, mean PA 33 mm Hg; mean PCWP 15 mm Hg; CO 8.47 L/min; CI 4.3  A drug-eluting stent was successfully placed using a SYNERGY XD 3.50X20.  Successful PCI of the ostial vein graft to OM.  Given her issues with a slow GI bleed, will hold aspirin and use Plavix monotherapy.  She will have further work-up at [facility] with double-balloon enteroscopy.  We will continue iron supplementation.  We will have to base need for transfusion on hemoglobin in the morning.  Baseline hemoglobin of 9.2 and gradually trending down over the last few months.    Cardiac Studies   R/LHC 09/12/20: Dist LM to Ost LAD lesion is 60% stenosed. LIMA to LAD is widely patent. Prox RCA to Mid RCA lesion is 40% stenosed. Could not selectively engage from the left radial but appeared patent. Ost Cx lesion is 99% stenosed. In stent restenosis. Origin lesion is 95% stenosed in the SVG to OM. A drug-eluting stent was successfully placed using a SYNERGY XD 3.50X20, postdilated to > 4 mm. Post intervention, there is a 0% residual stenosis. Hemodynamic findings consistent with mild pulmonary hypertension. Ao sat 98%; PA sat 71%; PA pressure 40/15, mean PA 33 mm Hg; mean PCWP 15 mm Hg; CO 8.47 L/min; CI 4.3 A drug-eluting stent was successfully placed using a SYNERGY XD 3.50X20.   Successful PCI of the ostial vein graft to OM.  Given her issues with a slow GI bleed, will hold aspirin and use Plavix monotherapy.  She will have further work-up at [facility] with double-balloon enteroscopy.  We will continue iron supplementation.  We will have to base need for transfusion on hemoglobin in the morning.  Baseline hemoglobin of 9.2 and gradually trending down over the last few months.    Diagnostic Dominance: Right    Intervention       Patient Profile     65 y.o. female with a PMH of CAD s/p CABG 04/2020, AS s/p AVR 04/2020, post-op atrial fibrillation,  chronic combined CHF/ICM, PAD s/p left common femoral artery stenting 2019, bialteral carotid artery stenosis s/p stenting of left ICA, HTN,  HLD, DM type 2, COPD, and OSA, who was seen outpatient 09/04/20 with complaints of progressive exertional chest pain and presented for outpatient cardiac cath.   Assessment & Plan   Unstable angina in patient with CAD s/p CABG 04/2020: patient seen outpatient with complaints of progressive exertional chest pain, recommended for [facility] She presented for cardiac cath 09/12/20 and was found to have severe native disease with 99% ISR of native Lcx, 95% stenosis of SVG to OM managed with PCI/DES, 60% distal LM/Ost LAD stenosis with patent LIMA to LAD, and 40% RCA stenosis. She was recommended to hold aspirin and use plavix monotherapy in light of her recent GI bleeding issues. Had recurrent chest pain when ambulating with cardiac rehab this morning.  - Continue plavix for a minimum of 6 months.  - Continue statin - Continue Bblocker - Will reassess for chest pain following transfusion as below  Chronic combined CHF/ICM: EF 50-55% on last echo 07/2020, up from 45-50% 04/2020 prior to CABG. She appears euvolemic on exam - Continue metoprolol and digoxin  AS s/p AVR: valve stable on echo 07/2020.  - Continue routine monitoring - Continue SBE ppx going forward  Post-op atrial fibrillation: occurred following CABG/AVR. Maintaining sinus rhythm this admission - Continue metoprolol and digoxin for rate control  HTN: BP overall stable, though soft overnight.  - Continue metoprolol   HLD: LDL 111 08/15/20 - Will repeat FLP today; if LDL not at goal of <70, will place referral to lipid clinic for PCSK9-inhibitor consideration - Continue atorvastatin  GI bleed: awaiting double-balloon enteroscopy at [facility] to further evaluate anemia. Hgb down to 7.7 this morning from 9.5 yesterday.  - Will transfuse 1 uPRBC for goal Hgb >8 - Continue ongoing outpatient work-up  DM type 2:  A1C 6.4 this admission, at goal of <7 - Continue ISS - Consider addition of SGLT2-inhibitor - will have case management check cost of medications  GERD: on omeprazole prior to admission - Continue pantoprazole - will need Rx for this medication at discharge given interaction between omeprazole and plavix.  COPD:  - Continue home advair and prn albuterol   For questions or updates, please contact [facility] Please consult [email or web address] for contact info under        Signed, Beatriz Stallion, PA-C  09/13/2020, 7:49 AM

## 2020-09-13 NOTE — Progress Notes (Signed)
Inpatient Diabetes Program Recommendations  AACE/ADA: New Consensus Statement on Inpatient Glycemic Control (2015)  Target Ranges:  Prepandial:   less than 140 mg/dL      Peak postprandial:   less than 180 mg/dL (1-2 hours)      Critically ill patients:  140 - 180 mg/dL   Lab Results  Component Value Date   GLUCAP 158 (H) 09/13/2020   HGBA1C 6.4 (H) 09/12/2020    Review of Glycemic Control  Diabetes history: DM2 Outpatient Diabetes medications: Lantus 24 units BID as needed if CBG > 150 mg/dL, Novolog 0-25 units BID (am and pm) Current orders for Inpatient glycemic control: Novolog 0-15 units TID with meals and 0-5 HS  HgbA1C - 6.4% - likely not accurate with low H/H CBGs 6/14: 121-315 mg/dL CBGs 4/27: 062, 376 mg/dL  Inpatient Diabetes Program Recommendations:    Will likely need part of basal insulin. Add Lantus 8 units BID, titrate as needed If post-prandials elevated, add small amount of meal coverage Novolog  Follow glucose trends.  Thank you. Ailene Ards, RD, LDN, CDE Inpatient Diabetes Coordinator [phone]

## 2020-09-13 NOTE — Progress Notes (Signed)
CARDIAC REHAB PHASE I   PRE:  Rate/Rhythm: 64 SR    BP: sitting 131/63    SaO2: 100 RA  MODE:  Ambulation: 100 ft   POST:  Rate/Rhythm: 134 ST    BP: sitting 164/83     SaO2: 100 RA  Pt ambulated independently with RW however after 50 ft HR up to 130s and began having CP, eventually up to 8/10. Pt rested then walked back to room. CP resolved with rest and decreasing HR in recliner. SaO2 100 RA. Notified RN and PA. Has not had advair so requesting that.  Discussed stent, Plavix, restrictions, exercise, diet, NTG and CRPII. Pt had been participating in [facility] before she began having angina. Wants to resume. Will re-refer and allow CRPII to decide if pt resumes or starts over. Understands the importance of Plavix. [id number]   Harriet Masson CES, ACSM 09/13/2020 9:56 AM

## 2020-09-14 DIAGNOSIS — Z952 Presence of prosthetic heart valve: Secondary | ICD-10-CM | POA: Diagnosis not present

## 2020-09-14 DIAGNOSIS — E119 Type 2 diabetes mellitus without complications: Secondary | ICD-10-CM

## 2020-09-14 DIAGNOSIS — I5042 Chronic combined systolic (congestive) and diastolic (congestive) heart failure: Secondary | ICD-10-CM

## 2020-09-14 DIAGNOSIS — I255 Ischemic cardiomyopathy: Secondary | ICD-10-CM | POA: Diagnosis not present

## 2020-09-14 DIAGNOSIS — E785 Hyperlipidemia, unspecified: Secondary | ICD-10-CM

## 2020-09-14 DIAGNOSIS — E1169 Type 2 diabetes mellitus with other specified complication: Secondary | ICD-10-CM

## 2020-09-14 DIAGNOSIS — I25700 Atherosclerosis of coronary artery bypass graft(s), unspecified, with unstable angina pectoris: Secondary | ICD-10-CM | POA: Diagnosis not present

## 2020-09-14 DIAGNOSIS — E7800 Pure hypercholesterolemia, unspecified: Secondary | ICD-10-CM | POA: Diagnosis not present

## 2020-09-14 DIAGNOSIS — D500 Iron deficiency anemia secondary to blood loss (chronic): Secondary | ICD-10-CM

## 2020-09-14 DIAGNOSIS — E1142 Type 2 diabetes mellitus with diabetic polyneuropathy: Secondary | ICD-10-CM

## 2020-09-14 DIAGNOSIS — Z794 Long term (current) use of insulin: Secondary | ICD-10-CM

## 2020-09-14 DIAGNOSIS — K922 Gastrointestinal hemorrhage, unspecified: Secondary | ICD-10-CM

## 2020-09-14 LAB — GLUCOSE, CAPILLARY: Glucose-Capillary: 170 mg/dL — ABNORMAL HIGH (ref 70–99)

## 2020-09-14 LAB — TYPE AND SCREEN
ABO/RH(D): A POS
Antibody Screen: NEGATIVE
Unit division: 0

## 2020-09-14 LAB — BPAM RBC
Blood Product Expiration Date: 202207052359
ISSUE DATE / TIME: 202206151412
Unit Type and Rh: 6200

## 2020-09-14 LAB — OCCULT BLOOD X 1 CARD TO LAB, STOOL: Fecal Occult Bld: POSITIVE — AB

## 2020-09-14 MED ORDER — PANTOPRAZOLE SODIUM 40 MG PO TBEC: 40.0000 mg | DELAYED_RELEASE_TABLET | Freq: Every day | ORAL | 11 refills | Status: DC

## 2020-09-14 MED ORDER — EMPAGLIFLOZIN 10 MG PO TABS: 10.0000 mg | ORAL_TABLET | Freq: Every day | ORAL | 11 refills | Status: DC

## 2020-09-14 MED ORDER — AMOXICILLIN 500 MG PO CAPS: 2000.0000 mg | ORAL_CAPSULE | ORAL | 3 refills | Status: AC | PRN

## 2020-09-14 MED ORDER — CLOPIDOGREL BISULFATE 75 MG PO TABS: 75.0000 mg | ORAL_TABLET | Freq: Every day | ORAL | 6 refills | Status: DC

## 2020-09-14 NOTE — Discharge Instructions (Signed)

## 2020-09-14 NOTE — Progress Notes (Signed)
CARDIAC REHAB PHASE I   PRE:  Rate/Rhythm: 77 SR    BP: sitting 114/95 in leg    SaO2: 97 RA  MODE:  Ambulation: 220 ft   POST:  Rate/Rhythm: 124 ST    BP: sitting 131/59     SaO2: 97 RA   Ambulated with RW. DOE and HR up to 124 ST but denied CP. HR lower than yesterday, longer distance today. Rest x2. Reviewed ed. [id number]  Harriet Masson CES, ACSM 09/14/2020 9:07 AM

## 2020-09-14 NOTE — Progress Notes (Signed)
Pt is alert and oriented. Discharge instructions/ AVS given to pt. 

## 2020-09-14 NOTE — Discharge Summary (Addendum)
Discharge Summary    Patient ID: Heidi Morse MRN: [id number]; DOB: April 28, 1955  Admit date: 09/12/2020 Discharge date: 09/14/2020  PCP:  Leola Brazil, DO   [facility] Providers Cardiologist:  Rollene Rotunda, MD        Discharge Diagnoses    Principal Problem:   CAD (coronary artery disease) of bypass graft Active Problems:   S/P aortic valve replacement with bioprosthetic valve   Insulin dependent type 2 diabetes mellitus (HCC)   Chronic combined systolic and diastolic CHF (congestive heart failure) (HCC)   Hyperlipidemia associated with type 2 diabetes mellitus (HCC), goal LDL < 70   GIB (gastrointestinal bleeding), presumed   Iron deficiency anemia due to chronic blood loss    Diagnostic Studies/Procedures    R/LHC 09/12/20: Dist LM to Ost LAD lesion is 60% stenosed. LIMA to LAD is widely patent. Prox RCA to Mid RCA lesion is 40% stenosed. Could not selectively engage from the left radial but appeared patent. Ost Cx lesion is 99% stenosed. In stent restenosis. Origin lesion is 95% stenosed in the SVG to OM. A drug-eluting stent was successfully placed using a SYNERGY XD 3.50X20, postdilated to > 4 mm. Post intervention, there is a 0% residual stenosis. Hemodynamic findings consistent with mild pulmonary hypertension. Ao sat 98%; PA sat 71%; PA pressure 40/15, mean PA 33 mm Hg; mean PCWP 15 mm Hg; CO 8.47 L/min; CI 4.3 A drug-eluting stent was successfully placed using a SYNERGY XD 3.50X20.   Successful PCI of the ostial vein graft to OM.  Given her issues with a slow GI bleed, will hold aspirin and use Plavix monotherapy.  She will have further work-up at [facility] with double-balloon enteroscopy.  We will continue iron supplementation.  We will have to base need for transfusion on hemoglobin in the morning.  Baseline hemoglobin of 9.2 and gradually trending down over the last few months.     Diagnostic Dominance: Right      Intervention         _____________   History of Present Illness     Heidi Morse is a 65 y.o. female with  PMH of CAD s/p CABG 04/2020, AS s/p AVR 04/2020, post-op atrial fibrillation, chronic combined CHF/ICM, PAD s/p left common femoral artery stenting 2019, bialteral carotid artery stenosis s/p stenting of left ICA, HTN, HLD, DM type 2, COPD, and OSA, who was seen outpatient 09/04/20 with complaints of progressive exertional chest pain and presented for outpatient cardiac cath.  Hospital Course     Consultants: None   Unstable angina in patient with CAD s/p CABG 04/2020:  - admitted for cath 06/14 - Right and left cardiac catheterization results are above.  She had DES to the SVG-OM. - Plavix only 2nd ?GIB and anemia - on statin and BB - was having CP w/ ambulation post-procedure but Hgb was 7.7 with a heart rate of approximately 130 - After transfusion, she was ambulating more, she was less tachycardic, and did not have any chest pain with ambulation - currently cardiac stable   Chronic combined CHF/ICM:  - wt was 222 on 05/30, 227 on admit - EF 50-55% by echo 08/14/2020 - on metop and dig - euvolemic on exam -On as needed Lasix at home, continue this -Emphasized the need for low-sodium diet and daily weights at home   AS s/p AVR:  - echo 05/22 ok - needs pre-procedure SBE prophylaxis as an outpatient, will order   Post-op atrial fibrillation:  - happened 04/2020 post-op -  in SR - on metop and dig   HTN:  - BP stable on current meds w/ SBP 110s-130s   HLD:  - LDL 100 still above goal - Heidi Morse assures us she takes the Lipitor 80 mg every day.  - consider change to Crestor or referral to the Lipid Clinic for  PCSK-9 as outpt   Presumed GI bleed, iron deficient anemia :  - s/p transfusion 1 u PRBCs - GI has seen, capsule endoscopy results pending - [facility], [facility] anemia if this is still needed after reviewing capsule  endoscopy results   DM type 2: A1C 6.4 this admission, at goal of <7 - on ISS - add SGLT2 inhibitor if pt can afford   GERD:  - pta on omeprazole >> pantoprazole since on Plavix   COPD:  - no wheezing - continue home rx  Did the patient have an acute coronary syndrome (MI, NSTEMI, STEMI, etc) this admission?:  Yes                               AHA/ACC Clinical Performance & Quality Measures: Aspirin prescribed? - No - anemia ADP Receptor Inhibitor (Plavix/Clopidogrel, Brilinta/Ticagrelor or Effient/Prasugrel) prescribed (includes medically managed patients)? - Yes Beta Blocker prescribed? - Yes High Intensity Statin (Lipitor 40-80mg  or Crestor 20-40mg ) prescribed? - Yes EF assessed during THIS hospitalization? - Yes For EF <40%, was ACEI/ARB prescribed? - Not Applicable (EF >/= 40%) For EF <40%, Aldosterone Antagonist (Spironolactone or Eplerenone) prescribed? - Not Applicable (EF >/= 40%) Cardiac Rehab Phase II ordered (including medically managed patients)? - Yes      _____________  Discharge Vitals Blood pressure (!) 131/59, pulse 73, temperature 97.9 F (36.6 C), temperature source Oral, resp. rate 20, height 5' (1.524 m), weight 103 kg, last menstrual period 04/01/2000, SpO2 99 %.  Filed Weights   09/12/20 0600 09/13/20 1445  Weight: 103 kg 103 kg    Labs & Radiologic Studies    CBC Recent Labs    09/13/20 0153 09/13/20 1952  WBC 13.8*  --   HGB 7.7* 8.9*  HCT 25.9* 28.8*  MCV 84.1  --   PLT 225  --    Basic Metabolic Panel Recent Labs    09/81/1906/14/22 0827 09/13/20 0153  NA 143 141  K 4.2 4.4  CL  --  108  CO2  --  23  GLUCOSE  --  241*  BUN  --  26*  CREATININE  --  0.81  CALCIUM  --  8.5*   Liver Function Tests Lab Results  Component Value Date   ALT 12 03/29/2020   AST 12 (L) 03/29/2020   ALKPHOS 77 03/29/2020   BILITOT 0.6 03/29/2020    High Sensitivity Troponin:   Recent Labs  Lab 08/28/20 1113 08/28/20 1332  TROPONINIHS 17 18*     BNP Invalid input(s): POCBNP D-Dimer No results for input(s): DDIMER in the last 72 hours. Hemoglobin A1C Recent Labs    09/12/20 1146  HGBA1C 6.4*   Fasting Lipid Panel Recent Labs    09/13/20 0815  CHOL 155  HDL 43  LDLCALC 100*  TRIG 60  CHOLHDL 3.6   No results found for: DIGOXIN  Lab Results  Component Value Date   IRON 27 (L) 03/28/2020   TIBC 403 03/28/2020   FERRITIN 32 03/28/2020    Thyroid Function Tests No results for input(s): TSH,  T4TOTAL, T3FREE, THYROIDAB in the last 72 hours.  Invalid input(s): FREET3 _____________   CARDIAC CATHETERIZATION  Result Date: 09/12/2020  Dist LM to Ost LAD lesion is 60% stenosed. LIMA to LAD is widely patent.  Prox RCA to Mid RCA lesion is 40% stenosed. Could not selectively engage from the left radial but appeared patent.  Ost Cx lesion is 99% stenosed. In stent restenosis.  Origin lesion is 95% stenosed in the SVG to OM. A drug-eluting stent was successfully placed using a SYNERGY XD 3.50X20, postdilated to > 4 mm.  Post intervention, there is a 0% residual stenosis.  Hemodynamic findings consistent with mild pulmonary hypertension.  Ao sat 98%; PA sat 71%; PA pressure 40/15, mean PA 33 mm Hg; mean PCWP 15 mm Hg; CO 8.47 L/min; CI 4.3  A drug-eluting stent was successfully placed using a SYNERGY XD 3.50X20.  Successful PCI of the ostial vein graft to OM.  Given her issues with a slow GI bleed, will hold aspirin and use Plavix monotherapy.  She will have further work-up at [facility] with double-balloon enteroscopy.  We will continue iron supplementation.  We will have to base need for transfusion on hemoglobin in the morning.  Baseline hemoglobin of 9.2 and gradually trending down over the last few months.   Disposition   Pt is being discharged home today in improved condition.  Follow-up Plans & Appointments     Follow-up Information     Rollene Rotunda, MD Follow up.   Specialty: Cardiology Why: Keep scheduled  follow up appointments. Contact information: [address] [place] [address] [phone]                Discharge Instructions     (HEART FAILURE PATIENTS) Call MD:  Anytime you have any of the following symptoms: 1) 3 pound weight gain in 24 hours or 5 pounds in 1 week 2) shortness of breath, with or without a dry hacking cough 3) swelling in the hands, feet or stomach 4) if you have to sleep on extra pillows at night in order to breathe.   Complete by: As directed    Amb Referral to Cardiac Rehabilitation   Complete by: As directed    To [place]   Diagnosis:  Coronary Stents PTCA     After initial evaluation and assessments completed: Virtual Based Care may be provided alone or in conjunction with Phase 2 Cardiac Rehab based on patient barriers.: Yes   Diet - low sodium heart healthy   Complete by: As directed    Diet Carb Modified   Complete by: As directed    Increase activity slowly   Complete by: As directed        Discharge Medications   Allergies as of 09/14/2020       Reactions   Peanut Oil Swelling, Hives   Peanut-containing Drug Products Hives, Swelling   Povidone-iodine Other (See Comments)   Unknown reaction  PT STATES SHE HEARD HER DERMATOLOGIST MENTION THEY WERE GLAD THEY DID NOT USE THIS DURING A PROCEDURE, PT STATES SHE IS REALLY NOT SURE WHY.   Shellfish Allergy Other (See Comments)   Unknown reaction   Statins Other (See Comments)   myalgia   Iodine Itching, Rash        Medication List     STOP taking these medications    aspirin 325 MG EC tablet   omeprazole 40 MG capsule Commonly known as: PRILOSEC       TAKE these  medications    albuterol 108 (90 Base) MCG/ACT inhaler Commonly known as: VENTOLIN HFA Inhale 2 puffs into the lungs every 6 (six) hours as needed for wheezing.   amoxicillin 500 MG capsule Commonly known as: AMOXIL Take 4 capsules (2,000 mg total) by mouth as needed (before dental work or  other procedures).   atorvastatin 80 MG tablet Commonly known as: LIPITOR Take 80 mg by mouth at bedtime.   cholecalciferol 25 MCG (1000 UNIT) tablet Commonly known as: VITAMIN D3 Take 1,000 Units by mouth 2 (two) times daily.   clopidogrel 75 MG tablet Commonly known as: PLAVIX Take 1 tablet (75 mg total) by mouth daily with breakfast. Start taking on: September 15, 2020   digoxin 0.125 MG tablet Commonly known as: LANOXIN Take 1 tablet (0.125 mg total) by mouth daily. What changed: when to take this   empagliflozin 10 MG Tabs tablet Commonly known as: Jardiance Take 1 tablet (10 mg total) by mouth daily before breakfast.   ferrous sulfate 325 (65 FE) MG tablet Take 325 mg by mouth 2 (two) times daily.   Fluticasone-Salmeterol 250-50 MCG/DOSE Aepb Commonly known as: ADVAIR Inhale 1 puff into the lungs in the morning and at bedtime.   furosemide 40 MG tablet Commonly known as: LASIX Take 40 mg by mouth See admin instructions. Take one tablet (40 mg) by mouth when instructed to do so by MD - for shortness of breath/swelling/weight gain   gabapentin 300 MG capsule Commonly known as: NEURONTIN Take 300 mg by mouth See admin instructions. Take one capsule (300 mg) by mouth twice daily - may also take 2 more times during the day as needed for pain   HYDROcodone-acetaminophen 10-325 MG tablet Commonly known as: NORCO Take 1 tablet by mouth every 4 (four) hours as needed (pain).   insulin aspart 100 UNIT/ML FlexPen Commonly known as: NOVOLOG Inject 7-14 Units into the skin in the morning and at bedtime. Dose based on CBG   insulin glargine 100 UNIT/ML Solostar Pen Commonly known as: LANTUS Inject 24 Units into the skin 2 (two) times daily as needed (CBG >150).   metoprolol tartrate 25 MG tablet Commonly known as: LOPRESSOR TAKE 1 AND 1/2 TABLETS TWICE DAILY What changed:  how much to take how to take this when to take this additional instructions   montelukast 10 MG  tablet Commonly known as: SINGULAIR Take 10 mg by mouth every morning.   OXYGEN Inhale into the lungs as needed (shortness of breath).   pantoprazole 40 MG tablet Commonly known as: Protonix Take 1 tablet (40 mg total) by mouth daily.   potassium chloride SA 20 MEQ tablet Commonly known as: KLOR-CON Take 1 tablet (20 mEq total) by mouth daily. Take only when you take your furosemide. What changed:  when to take this additional instructions Notes to patient: Take ONLY when you take the furosemide (Lasix)   SALONPAS GEL-PATCH HOT EX Apply 1 patch topically 2 (two) times daily. Knee pain   valACYclovir 500 MG tablet Commonly known as: VALTREX Take 500 mg by mouth 2 (two) times daily as needed (for flare ups).           Outstanding Labs/Studies   None  Duration of Discharge Encounter   Greater than 30 minutes including physician time.  Signed, Theodore Demark, PA-C 09/14/2020, 11:32 AM

## 2020-09-14 NOTE — Progress Notes (Addendum)
Progress Note  Patient Name: Heidi Morse Date of Encounter: 09/14/2020  [facility] Cardiologist: Rollene Rotunda, MD   Subjective   No CP this am, has not been OOB yet, hopes to walk w/ cardiac rehab early. Feels a little better after transfusion  Inpatient Medications    Scheduled Meds:  atorvastatin  80 mg Oral QHS   cholecalciferol  1,000 Units Oral BID   clopidogrel  75 mg Oral Q breakfast   digoxin  0.125 mg Oral q morning   ferrous sulfate  325 mg Oral BID   gabapentin  300 mg Oral BID   insulin aspart  0-15 Units Subcutaneous TID WC   insulin aspart  0-5 Units Subcutaneous QHS   metoprolol tartrate  37.5 mg Oral BID   mometasone-formoterol  2 puff Inhalation BID   montelukast  10 mg Oral q morning   sodium chloride flush  3 mL Intravenous Q12H   sodium chloride flush  3 mL Intravenous Q12H   Continuous Infusions:  sodium chloride     PRN Meds: sodium chloride, acetaminophen, albuterol, HYDROcodone-acetaminophen, ondansetron (ZOFRAN) IV, sodium chloride flush   Vital Signs    Vitals:   09/13/20 1749 09/13/20 2014 09/13/20 2100 09/14/20 0531  BP: (!) 168/75  (!) 145/52 126/75  Pulse: 81  86 65  Resp: 20  18 20   Temp: 98.2 F (36.8 C)  98.2 F (36.8 C) 97.9 F (36.6 C)  TempSrc: Oral  Oral Oral  SpO2: 100% 95% 96% 98%  Weight:      Height:        Intake/Output Summary (Last 24 hours) at 09/14/2020 0659 Last data filed at 09/14/2020 0532 Gross per 24 hour  Intake 706.33 ml  Output 1350 ml  Net -643.67 ml   Last 3 Weights 09/13/2020 09/12/2020 09/04/2020  Weight (lbs) 227 lb 227 lb 228 lb  Weight (kg) 102.967 kg 102.967 kg 103.42 kg      Telemetry    SR, 3 bts VT- Personally Reviewed  ECG    None today- Personally Reviewed  Physical Exam   General: Well developed, well nourished, female in no acute distress Head: Eyes PERRLA, Head normocephalic and atraumatic Lungs: clear bilaterally to auscultation. Heart: HRRR S1 S2, without rub or  gallop. 2/6 murmur. 4/4 extremity pulses are 2+ & equal. No JVD. Abdomen: Bowel sounds are present, abdomen soft and non-tender without masses or  hernias noted. Msk: Normal strength and tone for age. Extremities: No clubbing, cyanosis or edema.    Skin:  No rashes or lesions noted. Neuro: Alert and oriented X 3. Psych:  Good affect, responds appropriately   Labs    High Sensitivity Troponin:   Recent Labs  Lab 08/28/20 1113 08/28/20 1332  TROPONINIHS 17 18*      Chemistry Recent Labs  Lab 09/12/20 0822 09/12/20 0827 09/13/20 0153  NA 144 143 141  K 4.1 4.2 4.4  CL  --   --  108  CO2  --   --  23  GLUCOSE  --   --  241*  BUN  --   --  26*  CREATININE  --   --  0.81  CALCIUM  --   --  8.5*  GFRNONAA  --   --  >60  ANIONGAP  --   --  10     Hematology Recent Labs  Lab 09/12/20 0827 09/13/20 0153 09/13/20 1952  WBC  --  13.8*  --   RBC  --  3.08*  --   HGB 9.5* 7.7* 8.9*  HCT 28.0* 25.9* 28.8*  MCV  --  84.1  --   MCH  --  25.0*  --   MCHC  --  29.7*  --   RDW  --  18.1*  --   PLT  --  225  --    Lab Results  Component Value Date   CHOL 155 09/13/2020   HDL 43 09/13/2020   LDLCALC 100 (H) 09/13/2020   TRIG 60 09/13/2020   CHOLHDL 3.6 09/13/2020   No results found for: TSH Lab Results  Component Value Date   HGBA1C 6.4 (H) 09/12/2020   No components found for: HEMOCCULT  BNPNo results for input(s): BNP, PROBNP in the last 168 hours.   DDimer No results for input(s): DDIMER in the last 168 hours.   Radiology    CARDIAC CATHETERIZATION  Result Date: 09/12/2020  Dist LM to Ost LAD lesion is 60% stenosed. LIMA to LAD is widely patent.  Prox RCA to Mid RCA lesion is 40% stenosed. Could not selectively engage from the left radial but appeared patent.  Ost Cx lesion is 99% stenosed. In stent restenosis.  Origin lesion is 95% stenosed in the SVG to OM. A drug-eluting stent was successfully placed using a SYNERGY XD 3.50X20, postdilated to > 4 mm.   Post intervention, there is a 0% residual stenosis.  Hemodynamic findings consistent with mild pulmonary hypertension.  Ao sat 98%; PA sat 71%; PA pressure 40/15, mean PA 33 mm Hg; mean PCWP 15 mm Hg; CO 8.47 L/min; CI 4.3  A drug-eluting stent was successfully placed using a SYNERGY XD 3.50X20.  Successful PCI of the ostial vein graft to OM.  Given her issues with a slow GI bleed, will hold aspirin and use Plavix monotherapy.  She will have further work-up at [facility] with double-balloon enteroscopy.  We will continue iron supplementation.  We will have to base need for transfusion on hemoglobin in the morning.  Baseline hemoglobin of 9.2 and gradually trending down over the last few months.    Cardiac Studies   R/LHC 09/12/20: Dist LM to Ost LAD lesion is 60% stenosed. LIMA to LAD is widely patent. Prox RCA to Mid RCA lesion is 40% stenosed. Could not selectively engage from the left radial but appeared patent. Ost Cx lesion is 99% stenosed. In stent restenosis. Origin lesion is 95% stenosed in the SVG to OM. A drug-eluting stent was successfully placed using a SYNERGY XD 3.50X20, postdilated to > 4 mm. Post intervention, there is a 0% residual stenosis. Hemodynamic findings consistent with mild pulmonary hypertension. Ao sat 98%; PA sat 71%; PA pressure 40/15, mean PA 33 mm Hg; mean PCWP 15 mm Hg; CO 8.47 L/min; CI 4.3 A drug-eluting stent was successfully placed using a SYNERGY XD 3.50X20.   Successful PCI of the ostial vein graft to OM.  Given her issues with a slow GI bleed, will hold aspirin and use Plavix monotherapy.  She will have further work-up at [facility] with double-balloon enteroscopy.  We will continue iron supplementation.  We will have to base need for transfusion on hemoglobin in the morning.  Baseline hemoglobin of 9.2 and gradually trending down over the last few months.    Diagnostic Dominance: Right    Intervention       Patient Profile     65 y.o. female with a PMH of  CAD s/p CABG 04/2020, AS s/p AVR 04/2020, post-op atrial fibrillation, chronic combined  CHF/ICM, PAD s/p left common femoral artery stenting 2019, bialteral carotid artery stenosis s/p stenting of left ICA, HTN, HLD, DM type 2, COPD, and OSA, who was seen outpatient 09/04/20 with complaints of progressive exertional chest pain and presented for outpatient cardiac cath.   Assessment & Plan   Unstable angina in patient with CAD s/p CABG 04/2020:  - admitted for cath 06/14, s/p DES SVG-OM - Plavix only 2nd ?GIB and anemia - on statin and BB - was having CP w/ ambulation post-procedure but Hgb was 7.7 - continue cardiac rehab  Chronic combined CHF/ICM:  - wt was 222 on 05/30, 227 on admit - EF 50-55% by echo 08/14/2020 - on metop and dig - euvolemic on exam  AS s/p AVR:  - echo 05/22 ok - SBP prophylaxis  Post-op atrial fibrillation:  - happened 04/2020 post-op - in SR - on metop and dig  HTN:  - BP stable on current meds  HLD:  - LDL 100 still above goal - consider [facility]-9 as outpt  GI bleed:  - s/p transfusion 1 u PRBCs - GI has seen, capsule endoscopy results pending - f/u at [facility] as well  DM type 2: A1C 6.4 this admission, at goal of <7 - on ISS - add SGLT2 inhibitor if pt can afford  GERD:  - pta on omeprazole >> pantoprazole since on Plavix  COPD:  - no wheezing on home rx  Plan: continue ambulation w/ cardiac rehab and see how tolerated  For questions or updates, please contact [facility] Please consult [email or web address] for contact info under        Signed, Theodore Demark, PA-C  09/14/2020, 6:59 AM

## 2020-09-21 ENCOUNTER — Telehealth (HOSPITAL_COMMUNITY): Payer: Self-pay

## 2020-09-21 ENCOUNTER — Telehealth: Payer: Self-pay | Admitting: Cardiology

## 2020-09-21 NOTE — Telephone Encounter (Signed)
Pt is calling to confirm all the medications that were called in for her

## 2020-09-21 NOTE — Telephone Encounter (Signed)
Per phase I cardiac rehab, fax cardiac rehab referral to [place] [facility]. 

## 2020-09-21 NOTE — Telephone Encounter (Signed)
RN returned call to patient to review medications that were called in. Detailed message left reviewing medications that were called I on 6/16. Instructed to patient to call back with any questions or concerns.

## 2020-09-27 ENCOUNTER — Telehealth: Payer: Self-pay | Admitting: Cardiology

## 2020-09-27 NOTE — Telephone Encounter (Signed)
   Patient Name: Heidi Morse  DOB: 05/01/1955  MRN: [id number]   Primary Cardiologist: Rollene Rotunda, MD  Chart reviewed as part of pre-operative protocol coverage.   Simple dental extractions are considered low risk procedures per guidelines and generally do not require any specific cardiac clearance. It is also generally accepted that for simple extractions and dental cleanings, there is no need to interrupt blood thinner therapy.  SBE prophylaxis is required for the patient from a cardiac standpoint. ABX was prescribed to her on 09/14/20.  I will route this recommendation to the requesting party via Epic fax function and remove from pre-op pool.  Please call with questions.  Roe Rutherford Juvencio Verdi, PA 09/27/2020, 10:27 AM

## 2020-09-27 NOTE — Progress Notes (Signed)
Cardiology Office Note   Date:  09/29/2020   ID:  Mardelle Pandolfi, DOB [birth year], MRN [id number]  PCP:  Leola Brazil, DO  Cardiologist:   Rollene Rotunda, MD  Chief Complaint  Patient presents with   Shortness of Breath       History of Present Illness: Heidi Morse is a 65 y.o. female who presents for evaluation CAD s/p CABG, aortic stenosis s/p AVR, postop atrial fibrillation, ischemic cardiomyopathy, PAD s/p left common femoral artery stenting 11/2017, bilateral carotid artery disease s/p stenting of left ICA, COPD, HTN, HLD, DM 2, and obstructive sleep apnea.  The patient has had multiple stenting in the past.  She had chest pain and had an endoscopy in December 2021 that showed bleeding AVMs.  She was subsequently admitted at [facility] for anemia and GI Bleed.  She was seen at [facility] ED on 07/27/2019 due to persistent chest pain unrelieved by nitroglycerin.  Work-up was unremarkable.  She was felt to have noncardiac type chest discomfort.  She presented to [facility] near the end of December for evaluation of chest pain again.  Troponin was negative.  Hemoglobin was 7.6 and the she was transfused with 1 pack of red blood cell.  GI service was contacted who did not recommend any intervention.  She subsequently underwent left heart cath that showed patent left main stent with distal moderate restenosis, haziness noted via ostial LAD suggesting moderate restenosis of the stent, severe restenosis of the ostial left circumflex stent.  Echocardiogram showed EF 45 to 50%, mild LVH, grade 2 DD, mild to moderate AI, moderate aortic stenosis and moderate MR. She was seen by CT surgery and eventually underwent CABG x2 with LIMA to LAD and SVG to OM1 as well as AVR on 04/03/2020 by Dr. Cliffton Asters.  Postop course was complicated by atrial fibrillation with RVR.  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 She was given digoxin and beta-blocker.  She subsequently  converted back to sinus rhythm therefore was not started on anticoagulation therapy.   I saw her she had increased SOB.  She had cath and had high grade stenosis of an SVG as below.  Since the angioplasty she is done much better.  She is not having breathlessness and she was with activities.  She is starting to do a little bit more like walking through the grocery store and to the mailbox.  She has changed her diet. The patient denies any new symptoms such as chest discomfort, neck or arm discomfort. There has been no new shortness of breath, PND or orthopnea. There have been no reported palpitations, presyncope or syncope.   Past Medical History:  Diagnosis Date   Asthma    Bilateral carotid artery stenosis    CAD (coronary artery disease)    s/p CABG x2 in 04/2020   CHF (congestive heart failure) (HCC)    COPD (chronic obstructive pulmonary disease) (HCC)    Diabetes mellitus without complication (HCC)    Hypertension    MI (myocardial infarction) (HCC)    PAD (peripheral artery disease) (HCC)    Sleep apnea     Past Surgical History:  Procedure Laterality Date   AORTIC ROOT ENLARGEMENT N/A 04/03/2020   Procedure: AORTIC ROOT ENLARGEMENT USING HEMASHIELD PLATINUM WOVEN DOUBLE VELOUR VASCULAR GRAFT 28 MM X 30 CM;  Surgeon: Corliss Skains, MD;  Location: [facility] OR;  Service: Open Heart Surgery;  Laterality: N/A;   AORTIC VALVE REPLACEMENT N/A 04/03/2020  Procedure: CORONARY ARTERY BYPASS GRAFTING X 2 ON CARDIOPULMONARY BYPASS. AORTIC VALVE REPLACEMENT USING 21 MM INSPIRIS RESILIA AORTIC VALVE, STERNAL PLATING;  Surgeon: Corliss SkainsLightfoot, Harrell O, MD;  Location: [facility] OR;  Service: Open Heart Surgery;  Laterality: N/A;  Coronary artery bypass grafting  Flow Trac   CORONARY STENT INTERVENTION N/A 09/12/2020   Procedure: CORONARY STENT INTERVENTION;  Surgeon: Corky CraftsVaranasi, Jayadeep S, MD;  [facility];  Service: Cardiovascular;  Laterality: N/A;   ENDOVEIN HARVEST OF GREATER SAPHENOUS VEIN  Right 04/03/2020   Procedure: ENDOVEIN HARVEST OF GREATER SAPHENOUS VEIN;  Surgeon: Corliss SkainsLightfoot, Harrell O, MD;  Location: [facility] OR;  Service: Open Heart Surgery;  Laterality: Right;   LEFT HEART CATH AND CORONARY ANGIOGRAPHY N/A 03/30/2020   Procedure: LEFT HEART CATH AND CORONARY ANGIOGRAPHY;  Surgeon: Kathleene HazelMcAlhany, Christopher D, MD;  Location: [facility] INVASIVE CV LAB;  Service: Cardiovascular;  Laterality: N/A;   RIGHT/LEFT HEART CATH AND CORONARY/GRAFT ANGIOGRAPHY N/A 09/12/2020   Procedure: RIGHT/LEFT HEART CATH AND CORONARY/GRAFT ANGIOGRAPHY;  Surgeon: Corky CraftsVaranasi, Jayadeep S, MD;  [facility];  Service: Cardiovascular;  Laterality: N/A;   TEE WITHOUT CARDIOVERSION N/A 04/03/2020   Procedure: TRANSESOPHAGEAL ECHOCARDIOGRAM (TEE);  Surgeon: Corliss SkainsLightfoot, Harrell O, MD;  [facility];  Service: Open Heart Surgery;  Laterality: N/A;     Current Outpatient Medications  Medication Sig Dispense Refill   albuterol (VENTOLIN HFA) 108 (90 Base) MCG/ACT inhaler Inhale 2 puffs into the lungs every 6 (six) hours as needed for wheezing.     amoxicillin (AMOXIL) 500 MG capsule Take 4 capsules (2,000 mg total) by mouth as needed (before dental work or other procedures). 16 capsule 3   atorvastatin (LIPITOR) 80 MG tablet Take 80 mg by mouth at bedtime.     Capsaicin-Menthol (SALONPAS GEL-PATCH HOT EX) Apply 1 patch topically 2 (two) times daily. Knee pain     cholecalciferol (VITAMIN D3) 25 MCG (1000 UNIT) tablet Take 1,000 Units by mouth 2 (two) times daily.     clopidogrel (PLAVIX) 75 MG tablet Take 1 tablet (75 mg total) by mouth daily with breakfast. 30 tablet 6   digoxin (LANOXIN) 0.125 MG tablet Take 1 tablet (0.125 mg total) by mouth daily. 30 tablet 5   empagliflozin (JARDIANCE) 10 MG TABS tablet Take 1 tablet (10 mg total) by mouth daily before breakfast. 30 tablet 11   ferrous sulfate 325 (65 FE) MG tablet Take 325 mg by mouth 2 (two) times daily.     Fluticasone-Salmeterol (ADVAIR) 250-50 MCG/DOSE  AEPB Inhale 1 puff into the lungs in the morning and at bedtime.     gabapentin (NEURONTIN) 300 MG capsule Take 300 mg by mouth See admin instructions. Take one capsule (300 mg) by mouth twice daily - may also take 2 more times during the day as needed for pain     HYDROcodone-acetaminophen (NORCO) 10-325 MG tablet Take 1 tablet by mouth every 4 (four) hours as needed (pain).     insulin aspart (NOVOLOG) 100 UNIT/ML FlexPen Inject 7-14 Units into the skin in the morning and at bedtime. Dose based on CBG     insulin glargine (LANTUS) 100 UNIT/ML Solostar Pen Inject 24 Units into the skin 2 (two) times daily as needed (CBG >150).     metoprolol tartrate (LOPRESSOR) 25 MG tablet TAKE 1 AND 1/2 TABLETS TWICE DAILY 90 tablet 3   montelukast (SINGULAIR) 10 MG tablet Take 10 mg by mouth every morning.     nitroGLYCERIN (NITROSTAT) 0.4 MG SL tablet  Place 1 tablet (0.4 mg total) under the tongue every 5 (five) minutes as needed for chest pain. 25 tablet 2   OXYGEN Inhale into the lungs as needed (shortness of breath).     pantoprazole (PROTONIX) 40 MG tablet Take 1 tablet (40 mg total) by mouth daily. 30 tablet 11   valACYclovir (VALTREX) 500 MG tablet Take 500 mg by mouth 2 (two) times daily as needed (for flare ups).     ibuprofen (ADVIL) 800 MG tablet Take 800 mg by mouth every 6 (six) hours as needed.     No current facility-administered medications for this visit.    Allergies:   Peanut oil, Peanut-containing drug products, Povidone-iodine, Shellfish allergy, Statins, and Iodine    ROS:  Please see the history of present illness.   Otherwise, review of systems are positive for none.   All other systems are reviewed and negative.    PHYSICAL EXAM: VS:  BP 136/70 (BP Location: Left Arm)   Pulse 77   Ht 5' (1.524 m)   Wt 229 lb 6.4 oz (104.1 kg)   LMP 04/01/2000 (Approximate)   SpO2 99%   BMI 44.80 kg/m  , BMI Body mass index is 44.8 kg/m. GENERAL:  Well appearing NECK:  No jugular venous  distention, waveform within normal limits, carotid upstroke brisk and symmetric, bilateral bruits, no thyromegaly LUNGS:  Clear to auscultation bilaterally CHEST:  Unremarkable HEART:  PMI not displaced or sustained,S1 and S2 within normal limits, no S3, no S4, no clicks, no rubs, 3 of 6 apical systolic murmur radiating at the aortic outflow tract, no diastolic murmurs ABD:  Flat, positive bowel sounds normal in frequency in pitch, no bruits, no rebound, no guarding, no midline pulsatile mass, no hepatomegaly, no splenomegaly EXT:  2 plus pulses throughout, no edema, no cyanosis no clubbing, left radial access site with mild ecchymosis but no erythema or drainage  EKG:  EKG is not ordered today.    Recent Labs: 03/29/2020: ALT 12 04/04/2020: Magnesium 2.5 08/28/2020: B Natriuretic Peptide 199.0 09/13/2020: BUN 26; Creatinine, Ser 0.81; Hemoglobin 8.9; Platelets 225; Potassium 4.4; Sodium 141    Lipid Panel    Component Value Date/Time   CHOL 155 09/13/2020 0815   TRIG 60 09/13/2020 0815   HDL 43 09/13/2020 0815   CHOLHDL 3.6 09/13/2020 0815   VLDL 12 09/13/2020 0815   LDLCALC 100 (H) 09/13/2020 0815      Wt Readings from Last 3 Encounters:  09/29/20 229 lb 6.4 oz (104.1 kg)  09/13/20 227 lb (103 kg)  09/04/20 228 lb (103.4 kg)    Diagnostic cadiac cath Dominance: Right    Intervention       Other studies Reviewed: Additional studies/ records that were reviewed today include: Hospital records Review of the above records demonstrates:  Please see elsewhere in the note.    ASSESSMENT AND PLAN:  Symptomatic Anemia She is still pursuing follow-up at [facility] and she will have a hemoglobin done early next month at the cancer center.    Unstable Angina She is now much better status post cath and PCI.  She will continue with risk reduction.  Because of her bleeding we are using Plavix only and not dual antiplatelet therapy.   Chronic diastolic heart failure She seems to  be euvolemic.  No change in therapy.   HLD Is mildly above target.  However, she is going to participate one of our clinical trials for lipid management and so I will defer to this.  They are going to contact her at the end of this month.    HTN Blood pressures controlled.  No change in therapy.  Dsypnea She does have home O2 but she uses it infrequently.   Valvular disease This was functioning normally on echo in May.  The mean gradient is 18.  Obesity We had a long discussion about exercise and diet and she is pursuing changes.  Current medicines are reviewed at length with the patient today.  The patient does not have concerns regarding medicines.  The following changes have been made:  None  Labs/ tests ordered today include:  None    No orders of the defined types were placed in this encounter.    Disposition:   FU with me or Azalee Course PAC  in 3 months.    Signed, Rollene Rotunda, MD  09/29/2020 10:13 AM    [facility]

## 2020-09-27 NOTE — Telephone Encounter (Signed)
   [facility] Pre-operative Risk Assessment    Patient Name: Heidi Morse  DOB: [birth year]  MRN: [id number]   HEARTCARE STAFF: - Please ensure there is not already an duplicate clearance open for this procedure. - Under Visit Info/Reason for Call, type in Other and utilize the format Clearance MM/DD/YY or Clearance TBD. Do not use dashes or single digits. - If request is for dental extraction, please clarify the # of teeth to be extracted. - If the patient is currently at the dentist's office, call Pre-Op APP to address. If the patient is not currently in the dentist office, please route to the Pre-Op pool  Request for surgical clearance:  What type of surgery is being performed? Possible tooth (or teeth). Patient is at the dentist now  When is this surgery scheduled?   What type of clearance is required (medical clearance vs. Pharmacy clearance to hold med vs. Both)?   Are there any medications that need to be held prior to surgery and how long?  Practice name and name of physician performing surgery? [facility]  What is the office phone number? [phone]   7.   What is the office fax number? [phone]  8.   Anesthesia type (None, local, MAC, general) ?    Johnna Acosta 09/27/2020, 10:17 AM  _________________________________________________________________   (provider comments below)

## 2020-09-28 ENCOUNTER — Ambulatory Visit: Payer: Medicaid Other | Admitting: Cardiology

## 2020-09-29 ENCOUNTER — Other Ambulatory Visit: Payer: Self-pay

## 2020-09-29 ENCOUNTER — Encounter: Payer: Self-pay | Admitting: Cardiology

## 2020-09-29 ENCOUNTER — Ambulatory Visit (INDEPENDENT_AMBULATORY_CARE_PROVIDER_SITE_OTHER): Payer: Medicaid Other | Admitting: Cardiology

## 2020-09-29 VITALS — BP 136/70 | HR 77 | Ht 60.0 in | Wt 229.4 lb

## 2020-09-29 DIAGNOSIS — I5022 Chronic systolic (congestive) heart failure: Secondary | ICD-10-CM

## 2020-09-29 DIAGNOSIS — E785 Hyperlipidemia, unspecified: Secondary | ICD-10-CM

## 2020-09-29 DIAGNOSIS — I10 Essential (primary) hypertension: Secondary | ICD-10-CM | POA: Diagnosis not present

## 2020-09-29 DIAGNOSIS — I200 Unstable angina: Secondary | ICD-10-CM

## 2020-09-29 DIAGNOSIS — R0602 Shortness of breath: Secondary | ICD-10-CM

## 2020-09-29 MED ORDER — NITROGLYCERIN 0.4 MG SL SUBL: 0.4000 mg | SUBLINGUAL_TABLET | SUBLINGUAL | 2 refills | Status: DC | PRN

## 2020-09-29 NOTE — Patient Instructions (Addendum)
Medication Instructions:  START Nitroglycerin 0.4 mg. Place 1 tablet under tongue when chest pain starts if after 5 minutes place a Second tablet under your tongue, if after 5 minutes chest pain is still occurring place a Third tablet under your tongue. If after taking the Third tablet and chest pains are still occurring call 911 IMMEDIATELY   *If you need a refill on your cardiac medications before your next appointment, please call your pharmacy*  Lab Work: NONE ordered at this time of appointment   If you have labs (blood work) drawn today and your tests are completely normal, you will receive your results only by: MyChart Message (if you have MyChart) OR A paper copy in the mail If you have any lab test that is abnormal or we need to change your treatment, we will call you to review the results.  Testing/Procedures: NONE ordered at this time of appointment   Follow-Up: At [facility], you and your health needs are our priority.  As part of our continuing mission to provide you with exceptional heart care, we have created designated Provider Care Teams.  These Care Teams include your primary Cardiologist (physician) and Advanced Practice Providers (APPs -  Physician Assistants and Nurse Practitioners) who all work together to provide you with the care you need, when you need it.  We recommend signing up for the patient portal called "MyChart".  Sign up information is provided on this After Visit Summary.  MyChart is used to connect with patients for Virtual Visits (Telemedicine).  Patients are able to view lab/test results, encounter notes, upcoming appointments, etc.  Non-urgent messages can be sent to your provider as well.   To learn more about what you can do with MyChart, go to [email or web address].    Your next appointment:   3 month(s)  The format for your next appointment:   In Person  Provider:   Azalee Course, PA-C  Other Instructions

## 2020-10-07 ENCOUNTER — Emergency Department (HOSPITAL_COMMUNITY)
Admission: EM | Admit: 2020-10-07 | Discharge: 2020-10-07 | Disposition: A | Discharge status: Home / Self Care | Payer: Medicaid Other | Attending: Emergency Medicine | Admitting: Emergency Medicine

## 2020-10-07 ENCOUNTER — Other Ambulatory Visit: Payer: Self-pay

## 2020-10-07 ENCOUNTER — Encounter (HOSPITAL_COMMUNITY): Payer: Self-pay | Admitting: Emergency Medicine

## 2020-10-07 DIAGNOSIS — Z7984 Long term (current) use of oral hypoglycemic drugs: Secondary | ICD-10-CM | POA: Diagnosis not present

## 2020-10-07 DIAGNOSIS — Z7902 Long term (current) use of antithrombotics/antiplatelets: Secondary | ICD-10-CM | POA: Insufficient documentation

## 2020-10-07 DIAGNOSIS — J449 Chronic obstructive pulmonary disease, unspecified: Secondary | ICD-10-CM | POA: Diagnosis not present

## 2020-10-07 DIAGNOSIS — I110 Hypertensive heart disease with heart failure: Secondary | ICD-10-CM | POA: Insufficient documentation

## 2020-10-07 DIAGNOSIS — I5042 Chronic combined systolic (congestive) and diastolic (congestive) heart failure: Secondary | ICD-10-CM | POA: Insufficient documentation

## 2020-10-07 DIAGNOSIS — Z794 Long term (current) use of insulin: Secondary | ICD-10-CM | POA: Insufficient documentation

## 2020-10-07 DIAGNOSIS — Z955 Presence of coronary angioplasty implant and graft: Secondary | ICD-10-CM | POA: Diagnosis not present

## 2020-10-07 DIAGNOSIS — J45909 Unspecified asthma, uncomplicated: Secondary | ICD-10-CM | POA: Diagnosis not present

## 2020-10-07 DIAGNOSIS — Z79899 Other long term (current) drug therapy: Secondary | ICD-10-CM | POA: Insufficient documentation

## 2020-10-07 DIAGNOSIS — R21 Rash and other nonspecific skin eruption: Secondary | ICD-10-CM | POA: Insufficient documentation

## 2020-10-07 DIAGNOSIS — Z91010 Allergy to peanuts: Secondary | ICD-10-CM | POA: Insufficient documentation

## 2020-10-07 DIAGNOSIS — L299 Pruritus, unspecified: Secondary | ICD-10-CM | POA: Diagnosis not present

## 2020-10-07 DIAGNOSIS — Z7951 Long term (current) use of inhaled steroids: Secondary | ICD-10-CM | POA: Insufficient documentation

## 2020-10-07 DIAGNOSIS — E1169 Type 2 diabetes mellitus with other specified complication: Secondary | ICD-10-CM | POA: Insufficient documentation

## 2020-10-07 DIAGNOSIS — Z951 Presence of aortocoronary bypass graft: Secondary | ICD-10-CM | POA: Diagnosis not present

## 2020-10-07 DIAGNOSIS — I2510 Atherosclerotic heart disease of native coronary artery without angina pectoris: Secondary | ICD-10-CM | POA: Diagnosis not present

## 2020-10-07 MED ORDER — HYDROCORTISONE 2.5 % EX LOTN: TOPICAL_LOTION | Freq: Two times a day (BID) | CUTANEOUS | 0 refills | Status: DC

## 2020-10-07 NOTE — ED Triage Notes (Signed)
Pt reports generalized rash all over since Monday.  States she believes it may be related to cake she ate from a cook out.  She has known allergy to peanuts.  Took Benadryl 25mg  x 3 doses yesterday without improvement.  States it only itches if she is out in the heat.

## 2020-10-07 NOTE — ED Notes (Signed)
RN reviewed discharge instructions w/ pt. Follow up and prescriptions reviewed, pt had no further questions °

## 2020-10-07 NOTE — ED Provider Notes (Signed)
[facility] EMERGENCY DEPARTMENT Provider Note   CSN: [id number] Arrival date & time: 10/07/20  1025     History Chief Complaint  Patient presents with   Rash    Heidi Morse is a 65 y.o. female.  HPI Patient is a 65 year old female with a complex medical history noted below.  She presents to the emergency department due to a rash that started about 6 days ago.  States that her rash started across the left arm and then traveled across the torso and right arm as well.  Reports associated erythema and pruritus.  She lives alone and denies any other contacts with a similar rash.  Denies any new detergents, soaps, perfumes, or lotions but does note that she has been using new air freshener recently and believes that she sprayed the air freshener on her sofa where she has been lying down frequently.  Patient also notes a peanut allergy and states that peanuts typically cause hives and she had a slice of cake the day before the onset of her rash that likely contained nuts.  She states that she took Benadryl x3 yesterday with little improvement but does note that she has been applying alcohol to the rash and using antibacterial soap with moderate improvement in her symptoms.  Itching worsens when in the heat.  No facial swelling, tongue swelling, difficulty swallowing, chest pain, shortness of breath, abdominal pain, nausea, vomiting, diarrhea.    Past Medical History:  Diagnosis Date   Asthma    Bilateral carotid artery stenosis    CAD (coronary artery disease)    s/p CABG x2 in 04/2020   CHF (congestive heart failure) (HCC)    COPD (chronic obstructive pulmonary disease) (HCC)    Diabetes mellitus without complication (HCC)    Hypertension    MI (myocardial infarction) (HCC)    PAD (peripheral artery disease) (HCC)    Sleep apnea     Patient Active Problem List   Diagnosis Date Noted   Insulin dependent type 2 diabetes mellitus (HCC) 09/14/2020   Chronic combined  systolic and diastolic CHF (congestive heart failure) (HCC) 09/14/2020   Hyperlipidemia associated with type 2 diabetes mellitus (HCC), goal LDL < 70 09/14/2020   GIB (gastrointestinal bleeding), presumed 09/14/2020   Iron deficiency anemia due to chronic blood loss 09/14/2020   CAD (coronary artery disease) of bypass graft 09/12/2020   PAD (peripheral artery disease) (HCC) 05/01/2020   Bilateral carotid artery stenosis 05/01/2020   S/P CABG x 2 04/03/2020   S/P aortic valve replacement with bioprosthetic valve 04/03/2020   Coronary artery disease 04/03/2020   Unstable angina (HCC) 03/29/2020   Aortic stenosis 03/29/2020   Symptomatic anemia 03/28/2020    Past Surgical History:  Procedure Laterality Date   AORTIC ROOT ENLARGEMENT N/A 04/03/2020   Procedure: AORTIC ROOT ENLARGEMENT USING HEMASHIELD PLATINUM WOVEN DOUBLE VELOUR VASCULAR GRAFT 28 MM X 30 CM;  Surgeon: Corliss Skains, MD;  Location: [facility] OR;  Service: Open Heart Surgery;  Laterality: N/A;   AORTIC VALVE REPLACEMENT N/A 04/03/2020   Procedure: CORONARY ARTERY BYPASS GRAFTING X 2 ON CARDIOPULMONARY BYPASS. AORTIC VALVE REPLACEMENT USING 21 MM INSPIRIS RESILIA AORTIC VALVE, STERNAL PLATING;  Surgeon: Corliss Skains, MD;  Location: [facility] OR;  Service: Open Heart Surgery;  Laterality: N/A;  Coronary artery bypass grafting  Flow Trac   CORONARY STENT INTERVENTION N/A 09/12/2020   Procedure: CORONARY STENT INTERVENTION;  Surgeon: Corky Crafts, MD;  Location: [facility] INVASIVE CV LAB;  Service: Cardiovascular;  Laterality: N/A;   ENDOVEIN HARVEST OF GREATER SAPHENOUS VEIN Right 04/03/2020   Procedure: ENDOVEIN HARVEST OF GREATER SAPHENOUS VEIN;  Surgeon: Corliss SkainsLightfoot, Harrell O, MD;  Location: [facility] OR;  Service: Open Heart Surgery;  Laterality: Right;   LEFT HEART CATH AND CORONARY ANGIOGRAPHY N/A 03/30/2020   Procedure: LEFT HEART CATH AND CORONARY ANGIOGRAPHY;  Surgeon: Kathleene HazelMcAlhany, Christopher D, MD;  Location: [facility] INVASIVE CV LAB;   Service: Cardiovascular;  Laterality: N/A;   RIGHT/LEFT HEART CATH AND CORONARY/GRAFT ANGIOGRAPHY N/A 09/12/2020   Procedure: RIGHT/LEFT HEART CATH AND CORONARY/GRAFT ANGIOGRAPHY;  Surgeon: Corky CraftsVaranasi, Jayadeep S, MD;  [facility];  Service: Cardiovascular;  Laterality: N/A;   TEE WITHOUT CARDIOVERSION N/A 04/03/2020   Procedure: TRANSESOPHAGEAL ECHOCARDIOGRAM (TEE);  Surgeon: Corliss SkainsLightfoot, Harrell O, MD;  [facility];  Service: Open Heart Surgery;  Laterality: N/A;     OB History   No obstetric history on file.     No family history on file.  Social History   Tobacco Use   Smoking status: Never   Smokeless tobacco: Never    Home Medications Prior to Admission medications   Medication Sig Start Date End Date Taking? Authorizing Provider  hydrocortisone 2.5 % lotion Apply topically 2 (two) times daily. 10/07/20  Yes Placido SouJoldersma, Terran Hollenkamp, PA-C  albuterol (VENTOLIN HFA) 108 (90 Base) MCG/ACT inhaler Inhale 2 puffs into the lungs every 6 (six) hours as needed for wheezing. 03/09/20   [provider]  amoxicillin (AMOXIL) 500 MG capsule Take 4 capsules (2,000 mg total) by mouth as needed (before dental work or other procedures). 09/14/20 09/14/21  Barrett, Joline Salthonda G, PA-C  atorvastatin (LIPITOR) 80 MG tablet Take 80 mg by mouth at bedtime. 03/09/20   [provider]  Capsaicin-Menthol (SALONPAS GEL-PATCH HOT EX) Apply 1 patch topically 2 (two) times daily. Knee pain    [provider]  cholecalciferol (VITAMIN D3) 25 MCG (1000 UNIT) tablet Take 1,000 Units by mouth 2 (two) times daily.    [provider]  clopidogrel (PLAVIX) 75 MG tablet Take 1 tablet (75 mg total) by mouth daily with breakfast. 09/15/20   Barrett, Joline Salthonda G, PA-C  digoxin (LANOXIN) 0.125 MG tablet Take 1 tablet (0.125 mg total) by mouth daily. 05/29/20   Corrin ParkerGoodrich, Callie E, PA-C  empagliflozin (JARDIANCE) 10 MG TABS tablet Take 1 tablet (10 mg total) by mouth daily before breakfast.  09/14/20   Barrett, Joline Salthonda G, PA-C  ferrous sulfate 325 (65 FE) MG tablet Take 325 mg by mouth 2 (two) times daily. 03/24/20   [provider]  Fluticasone-Salmeterol (ADVAIR) 250-50 MCG/DOSE AEPB Inhale 1 puff into the lungs in the morning and at bedtime. 01/27/20   [provider]  gabapentin (NEURONTIN) 300 MG capsule Take 300 mg by mouth See admin instructions. Take one capsule (300 mg) by mouth twice daily - may also take 2 more times during the day as needed for pain 02/01/20   [provider]  HYDROcodone-acetaminophen (NORCO) 10-325 MG tablet Take 1 tablet by mouth every 4 (four) hours as needed (pain).    [provider]  ibuprofen (ADVIL) 800 MG tablet Take 800 mg by mouth every 6 (six) hours as needed. 09/28/20   [provider]  insulin aspart (NOVOLOG) 100 UNIT/ML FlexPen Inject 7-14 Units into the skin in the morning and at bedtime. Dose based on CBG    [provider]  insulin glargine (LANTUS) 100 UNIT/ML Solostar Pen Inject 24 Units into the skin 2 (two) times daily  as needed (CBG >150).    [provider]  metoprolol tartrate (LOPRESSOR) 25 MG tablet TAKE 1 AND 1/2 TABLETS TWICE DAILY 08/18/20   Azalee Course, PA  montelukast (SINGULAIR) 10 MG tablet Take 10 mg by mouth every morning. 01/05/20   [provider]  nitroGLYCERIN (NITROSTAT) 0.4 MG SL tablet Place 1 tablet (0.4 mg total) under the tongue every 5 (five) minutes as needed for chest pain. 09/29/20   Rollene Rotunda, MD  OXYGEN Inhale into the lungs as needed (shortness of breath).    [provider]  pantoprazole (PROTONIX) 40 MG tablet Take 1 tablet (40 mg total) by mouth daily. 09/14/20   Barrett, Joline Salt, PA-C  valACYclovir (VALTREX) 500 MG tablet Take 500 mg by mouth 2 (two) times daily as needed (for flare ups).    [provider]    Allergies    Peanut oil, Peanut-containing drug products, Povidone-iodine, Shellfish allergy, Statins, and  Iodine  Review of Systems   Review of Systems  HENT:  Negative for facial swelling, sore throat and trouble swallowing.   Respiratory:  Negative for chest tightness and shortness of breath.   Cardiovascular:  Negative for chest pain.  Gastrointestinal:  Negative for diarrhea, nausea and vomiting.  Skin:  Positive for color change and rash.   Physical Exam Updated Vital Signs BP (!) 149/77 (BP Location: Right Arm)   Pulse 65   Temp 98 F (36.7 C) (Oral)   Resp (!) 21   LMP 04/01/2000 (Approximate)   SpO2 100%   Physical Exam Vitals and nursing note reviewed.  Constitutional:      General: She is not in acute distress.    Appearance: Normal appearance. She is not ill-appearing, toxic-appearing or diaphoretic.  HENT:     Head: Normocephalic and atraumatic.     Comments: No facial swelling.  No intraoral swelling.    Right Ear: External ear normal.     Left Ear: External ear normal.     Nose: Nose normal.     Mouth/Throat:     Mouth: Mucous membranes are moist.     Pharynx: Oropharynx is clear. No oropharyngeal exudate or posterior oropharyngeal erythema.     Comments: Readily handling secretions.  No stridor.  Uvula midline.  Speaking clearly and coherently. Eyes:     Extraocular Movements: Extraocular movements intact.  Cardiovascular:     Rate and Rhythm: Normal rate and regular rhythm.     Pulses: Normal pulses.     Heart sounds: Normal heart sounds. No murmur heard.   No friction rub. No gallop.  Pulmonary:     Effort: Pulmonary effort is normal. No respiratory distress.     Breath sounds: Normal breath sounds. No stridor. No wheezing, rhonchi or rales.     Comments: LCTAB without wheezing, rales or rhonchi. Abdominal:     General: Abdomen is flat.     Tenderness: There is no abdominal tenderness.  Musculoskeletal:        General: Normal range of motion.     Cervical back: Normal range of motion and neck supple. No tenderness.  Skin:    General: Skin is warm and  dry.     Findings: Erythema and rash present.     Comments: Papular erythematous rash noted diffusely across the upper extremities and torso circumferentially.  No involvement along the hands or feet including the webspaces.    Neurological:     General: No focal deficit present.     Mental Status:  She is alert and oriented to person, place, and time.  Psychiatric:        Mood and Affect: Mood normal.        Behavior: Behavior normal.    ED Results / Procedures / Treatments   Labs (all labs ordered are listed, but only abnormal results are displayed) Labs Reviewed - No data to display  EKG None  Radiology No results found.  Procedures Procedures   Medications Ordered in ED Medications - No data to display  ED Course  I have reviewed the triage vital signs and the nursing notes.  Pertinent labs & imaging results that were available during my care of the patient were reviewed by me and considered in my medical decision making (see chart for details).    MDM Rules/Calculators/A&P                          Patient is a 65 year old female who presents to the emergency department due to a improving rash across the arms and torso that started about 6 days ago.  Likely a contact dermatitis but unsure of the source.  Patient does note that she has been spraying a new scented spray in her home and likely sprayed it on her couch prior to lying on the couch.    Physical exam significant for a papular erythematous rash with overlying excoriations across the torso and upper extremities.  No involvement of the webspaces.  Doubt scabies but urged patient to make sure that she washes all of her bedding and blankets in hot water.  We will discharge patient on a hydrocortisone cream.  She is a diabetic so do not feel that prednisone is reasonable at this time also given that her symptoms are improving.  Recommended patient follow-up with her PCP next week for reevaluation.  Return to the  emergency department if her sx should worsen.  Her questions were answered and she was amicable at the time of discharge.  Final Clinical Impression(s) / ED Diagnoses Final diagnoses:  Rash   Rx / DC Orders ED Discharge Orders          Ordered    hydrocortisone 2.5 % lotion  2 times daily        10/07/20 1112             Placido Sou, PA-C 10/07/20 1114    Vanetta Mulders, MD 10/08/20 [phone]

## 2020-10-07 NOTE — Discharge Instructions (Addendum)
I am prescribing you a hydrocortisone cream.  Please apply this to the rash 1-2 times per day.  This will hopefully help with the inflammation and itching.  Please follow-up with your regular doctor next week if your symptoms are not improving.  If you develop any chest pain, shortness of breath, facial swelling, tongue swelling, difficulty swallowing, please come back to the emergency department immediately for reevaluation.  It was a pleasure to meet you.

## 2020-10-25 ENCOUNTER — Other Ambulatory Visit: Payer: Self-pay | Admitting: Physician Assistant

## 2020-10-25 ENCOUNTER — Other Ambulatory Visit: Payer: Self-pay | Admitting: Student

## 2020-11-09 ENCOUNTER — Ambulatory Visit: Payer: Medicaid Other | Admitting: Cardiology

## 2020-11-21 ENCOUNTER — Telehealth: Payer: Self-pay

## 2020-11-21 NOTE — Telephone Encounter (Signed)
   [facility] Pre-operative Risk Assessment    Patient Name: Heidi Morse  DOB: Aug 16, 1955 MRN: [id number]  HEARTCARE STAFF:  - IMPORTANT!!!!!! Under Visit Info/Reason for Call, type in Other and utilize the format Clearance MM/DD/YY or Clearance TBD. Do not use dashes or single digits. - Please review there is not already an duplicate clearance open for this procedure. - If request is for dental extraction, please clarify the # of teeth to be extracted. - If the patient is currently at the dentist's office, call Pre-Op Callback Staff (MA/nurse) to input urgent request.  - If the patient is not currently in the dentist office, please route to the Pre-Op pool.  Request for surgical clearance:  What type of surgery is being performed? Antegrade Device Assisted Enteroscopy   When is this surgery scheduled? First week of October (no official date yet)  What type of clearance is required (medical clearance vs. Pharmacy clearance to hold med vs. Both)? BOTH  Are there any medications that need to be held prior to surgery and how long? PLAVIX   Practice name and name of physician performing surgery? [facility]  Roselyn Reef, MD  What is the office phone number? [phone]   7.   What is the office fax number? [phone]  8.   Anesthesia type (None, local, MAC, general) ? Not listed    Heidi Morse 11/21/2020, 3:32 PM  _________________________________________________________________   (provider comments below)

## 2020-11-21 NOTE — Telephone Encounter (Signed)
   Patient Name: Heidi Morse  DOB: [birth year] MRN: [id number]  Primary Cardiologist: Rollene Rotunda, MD  Chart reviewed as part of pre-operative protocol coverage. Complex history reviewed per chart. Has had prior PAD stenting, carotid stenting, and coronary intervention including CABG and PCIs with most recent PCI being angioplasty/DES to ostial VG to the OM. Will route first to Dr. Antoine Poche to inquire whether holidng of Plavix is feasible at this juncture since cath note recommends to continue uninterrupted DAPT for minimum of 6 months. She is not on ASA due to slow GIB. Surgery is planned October per clearance below, date TBD. Patient sees [name] end of October as well. Dr. Antoine Poche - Please route response to P CV DIV PREOP (the pre-op pool). Thank you.   Laurann Montana, PA-C 11/21/2020, 3:53 PM

## 2020-11-22 NOTE — Telephone Encounter (Addendum)
Spoke with Okey Regal, RN from requesting office. She states that she will send a message to the doctor with recommendations. She states that she will call us if further information is needed. Clearance has also been faxed to office. Correct number to reach requesting office is [phone]

## 2020-11-22 NOTE — Telephone Encounter (Signed)
   Name:  Quetzalli Clos  DOB:  Feb 09, 1956  MRN:  [id number]   Primary Cardiologist: Rollene Rotunda, MD  Chart reviewed as part of pre-operative protocol coverage. Complex history reviewed per chart. Has had prior PAD stenting, carotid stenting, and coronary intervention including CABG and PCIs with most recent PCI being angioplasty/DES to ostial VG to the OM. Case deferred to Dr. Antoine Poche due to recent PCI with post cath recommendations to continue uninterrupted DAPT for at least 6 months. Per Dr. Antoine Poche, he would suggest that she not electively hold Plavix at this time if the procedure can be deferred until 03/2021. She is not on ASA due to slow GIB. Surgery is planned October per clearance below, date TBD. Patient sees Azalee Course, [place] end of October as well.   Pre-op covering staff: - Please contact requesting surgeon's office via preferred method (i.e, phone, fax) to inform them of need to post-post procedure until 03/2021.  Georgie Chard, NP 11/22/2020, 8:58 AM

## 2020-11-23 ENCOUNTER — Telehealth: Payer: Self-pay | Admitting: Cardiology

## 2020-11-23 NOTE — Telephone Encounter (Signed)
   Pt c/o medication issue:  1. Name of Medication: atorvastatin (LIPITOR) 80 MG tablet digoxin (LANOXIN) 0.125 MG tablet  2. How are you currently taking this medication (dosage and times per day)?   3. Are you having a reaction (difficulty breathing--STAT)?   4. What is your medication issue? Pt said yesterday her pharmacy called her that there's a drug interaction between these 2 drugs. She said she's been having leg cramps and its very painful. She gets it at least 2 times a week

## 2020-11-23 NOTE — Telephone Encounter (Signed)
Pt was placed on digoxin after hospital admission on 6/14. Her pharmacy is concerned with possible drug interaction between atorvastatin and digoxin. She denies SOB, dizziness, CP, or vision changes. She does state she has been getting muscle cramps at night.  I advised her we usually check digoxin levels, however did not see one in her chart. Her muscle cramps may likely be from atorvastatin.  I will forward to Dr. Antoine Poche for review and recommendation.

## 2020-11-24 ENCOUNTER — Other Ambulatory Visit: Payer: Self-pay | Admitting: Student

## 2020-11-24 NOTE — Telephone Encounter (Signed)
Spoke with pt, Aware of dr hochrein's recommendations.  

## 2020-11-26 ENCOUNTER — Other Ambulatory Visit: Payer: Self-pay | Admitting: Cardiology

## 2020-11-27 ENCOUNTER — Telehealth: Payer: Self-pay | Admitting: Cardiology

## 2020-11-27 MED ORDER — PANTOPRAZOLE SODIUM 40 MG PO TBEC: 40.0000 mg | DELAYED_RELEASE_TABLET | Freq: Every day | ORAL | 11 refills | Status: DC

## 2020-11-27 NOTE — Telephone Encounter (Signed)
*  STAT* If patient is at the pharmacy, call can be transferred to refill team.   1. Which medications need to be refilled? (please list name of each medication and dose if known) pantoprazole (PROTONIX) 40 MG tablet  2. Which pharmacy/location (including street and city if local pharmacy) is medication to be sent to? [facility] [address] [place]  3. Do they need a 30 day or 90 day supply? 90 day  Patient states [facility] is no [facility] using [facility] and to update her pharmacy to [facility] [address].

## 2020-11-28 NOTE — Telephone Encounter (Signed)
Hi Dr. Antoine Poche,  [facility] reached back out to [place] and wanted to know if you would be OK with patient having her procedure (antegrade device assisted enteroscopy) in October if she could remain on Plavix or whether you would prefer her waiting until December when she can come off Plavix?  Please route response back to P CV DIV PREOP.  Thank you! Macallan Ord

## 2020-11-28 NOTE — Telephone Encounter (Signed)
Will forward to pre op pool for further review.

## 2020-11-28 NOTE — Telephone Encounter (Signed)
   Heidi Morse with [facility] calling to follow up. She said they received the faxed clearance, however, she wanted to know if Dr. Percival Spanish is ok for pt to get the procedure in October while on plavix or he prefer waiting in December when the pt can be off plavix. She also said, Anesthesia type: MAC

## 2020-11-29 NOTE — Telephone Encounter (Signed)
   Name: Heidi Morse  DOB: [birth year]  MRN: [id number]   Primary Cardiologist: Rollene Rotunda, MD  Chart reviewed as part of pre-operative protocol coverage. Has had prior PAD stenting, carotid stenting, and coronary intervention including CABG and PCIs with most recent PCI being angioplasty/DES to ostial VG to the OM on 09/12/20. She is on plavix monotherapy, she is not on ASA due to slow GIB.  Plavix should not be interrupted prior to 03/2021.   Per Dr. Antoine Poche:  If they can do the procedure on Plavix that would be fine to do it sooner.   I will route this recommendation to the requesting party via Epic fax function and remove from pre-op pool. Please call with questions.  Marcelino Duster, PA 11/29/2020, 9:31 AM

## 2020-12-15 ENCOUNTER — Telehealth: Payer: Self-pay | Admitting: Cardiology

## 2020-12-15 NOTE — Telephone Encounter (Signed)
*  STAT* If patient is at the pharmacy, call can be transferred to refill team.   1. Which medications need to be refilled? (please list name of each medication and dose if known)  gabapentin (NEURONTIN) 300 MG capsule pantoprazole (PROTONIX) 40 MG tablet  2. Which pharmacy/location (including street and city if local pharmacy) is medication to be sent to? [id number] - [place], [place] - [address] [facility] [address]  3. Do they need a 30 day or 90 day supply? 90 day   Patient has switched to [facility]. [facility] would [facility] from [facility] preferred pharmacies.

## 2020-12-25 NOTE — Telephone Encounter (Signed)
*  STAT* If patient is at the pharmacy, call can be transferred to refill team.   1. Which medications need to be refilled? (please list name of each medication and dose if known)  Humalog Insulin lispro 100 Unit/ML Blood Sugar Test Strips   2. Which pharmacy/location (including street and city if local pharmacy) is medication to be sent to? [id number] - [place], [place] - [address] AT [facility] [address]  3. Do they need a 30 day or 90 day supply?  90 day supply     Patient is calling back in regards to previous refill request never being sent to the pharmacy. She is also wanting to have the two listed above sent as well. Patient currently has a f/u scheduled for October. Please advise.

## 2020-12-29 ENCOUNTER — Telehealth: Payer: Self-pay | Admitting: Cardiology

## 2020-12-29 MED ORDER — CLOPIDOGREL BISULFATE 75 MG PO TABS: 75.0000 mg | ORAL_TABLET | Freq: Every day | ORAL | 6 refills | Status: DC

## 2020-12-29 NOTE — Telephone Encounter (Signed)
*  STAT* If patient is at the pharmacy, call can be transferred to refill team.   1. Which medications need to be refilled? (please list name of each medication and dose if known) clopidogrel (PLAVIX) 75 MG tablet  2. Which pharmacy/location (including street and city if local pharmacy) is medication to be sent to? [id number] - [place], [place] - [address] AT [facility] [address]  3. Do they need a 30 day or 90 day supply? 90

## 2021-01-18 ENCOUNTER — Telehealth: Payer: Self-pay | Admitting: Cardiology

## 2021-01-18 MED ORDER — METOPROLOL TARTRATE 25 MG PO TABS: ORAL_TABLET | ORAL | 1 refills | Status: DC

## 2021-01-18 NOTE — Telephone Encounter (Signed)
*  STAT* If patient is at the pharmacy, call can be transferred to refill team.   1. Which medications need to be refilled? (please list name of each medication and dose if known)  metoprolol tartrate (LOPRESSOR) 25 MG tablet  2. Which pharmacy/location (including street and city if local pharmacy) is medication to be sent to? [id number] - [place], [place] - [address] [facility] [address]  3. Do they need a 30 day or 90 day supply? 90 day supply

## 2021-01-25 ENCOUNTER — Ambulatory Visit (INDEPENDENT_AMBULATORY_CARE_PROVIDER_SITE_OTHER): Payer: Medicare Other | Admitting: Physician Assistant

## 2021-01-25 ENCOUNTER — Encounter: Payer: Self-pay | Admitting: Physician Assistant

## 2021-01-25 ENCOUNTER — Other Ambulatory Visit: Payer: Self-pay

## 2021-01-25 VITALS — BP 130/75 | HR 62 | Ht 60.0 in | Wt 232.6 lb

## 2021-01-25 DIAGNOSIS — I25810 Atherosclerosis of coronary artery bypass graft(s) without angina pectoris: Secondary | ICD-10-CM | POA: Diagnosis not present

## 2021-01-25 DIAGNOSIS — I480 Paroxysmal atrial fibrillation: Secondary | ICD-10-CM | POA: Diagnosis not present

## 2021-01-25 DIAGNOSIS — I10 Essential (primary) hypertension: Secondary | ICD-10-CM

## 2021-01-25 DIAGNOSIS — Z952 Presence of prosthetic heart valve: Secondary | ICD-10-CM | POA: Diagnosis not present

## 2021-01-25 DIAGNOSIS — E785 Hyperlipidemia, unspecified: Secondary | ICD-10-CM

## 2021-01-25 DIAGNOSIS — R0602 Shortness of breath: Secondary | ICD-10-CM

## 2021-01-25 DIAGNOSIS — E119 Type 2 diabetes mellitus without complications: Secondary | ICD-10-CM

## 2021-01-25 DIAGNOSIS — D509 Iron deficiency anemia, unspecified: Secondary | ICD-10-CM

## 2021-01-25 LAB — TSH: TSH: 2.52 u[IU]/mL (ref 0.450–4.500)

## 2021-01-25 LAB — BASIC METABOLIC PANEL
BUN/Creatinine Ratio: 24 (ref 12–28)
BUN: 15 mg/dL (ref 8–27)
CO2: 22 mmol/L (ref 20–29)
Calcium: 9.3 mg/dL (ref 8.7–10.3)
Chloride: 106 mmol/L (ref 96–106)
Creatinine, Ser: 0.62 mg/dL (ref 0.57–1.00)
Glucose: 57 mg/dL — ABNORMAL LOW (ref 70–99)
Potassium: 4.3 mmol/L (ref 3.5–5.2)
Sodium: 143 mmol/L (ref 134–144)
eGFR: 99 mL/min/{1.73_m2} (ref >59)

## 2021-01-25 LAB — CBC
Hematocrit: 37.7 % (ref 34.0–46.6)
Hemoglobin: 11.7 g/dL (ref 11.1–15.9)
MCH: 25.3 pg — ABNORMAL LOW (ref 26.6–33.0)
MCHC: 31 g/dL — ABNORMAL LOW (ref 31.5–35.7)
MCV: 82 fL (ref 79–97)
Platelets: 201 10*3/uL (ref 150–450)
RBC: 4.62 x10E6/uL (ref 3.77–5.28)
RDW: 15.9 % — ABNORMAL HIGH (ref 11.7–15.4)
WBC: 7.5 10*3/uL (ref 3.4–10.8)

## 2021-01-25 NOTE — Patient Instructions (Signed)
Medication Instructions:  Your physician recommends that you continue on your current medications as directed. Please refer to the Current Medication list given to you today.  *If you need a refill on your cardiac medications before your next appointment, please call your pharmacy*   Lab Work: Your physician recommends that you return for lab work TODAY:  BMET CBC TSH If you have labs (blood work) drawn today and your tests are completely normal, you will receive your results only by: MyChart Message (if you have MyChart) OR A paper copy in the mail If you have any lab test that is abnormal or we need to change your treatment, we will call you to review the results.  Testing/Procedures: NONE ordered at this time of appointment   Follow-Up: At [facility], you and your health needs are our priority.  As part of our continuing mission to provide you with exceptional heart care, we have created designated Provider Care Teams.  These Care Teams include your primary Cardiologist (physician) and Advanced Practice Providers (APPs -  Physician Assistants and Nurse Practitioners) who all work together to provide you with the care you need, when you need it.  We recommend signing up for the patient portal called "MyChart".  Sign up information is provided on this After Visit Summary.  MyChart is used to connect with patients for Virtual Visits (Telemedicine).  Patients are able to view lab/test results, encounter notes, upcoming appointments, etc.  Non-urgent messages can be sent to your provider as well.   To learn more about what you can do with MyChart, go to [email or web address].    Your next appointment:   3 week(s)  The format for your next appointment:   In Person  Provider:   APP  Other Instructions

## 2021-01-25 NOTE — Progress Notes (Signed)
Cardiology Office Note:    Date:  01/27/2021   ID:  Heidi Morse, DOB 12-10-1955, MRN [id number]  PCP:  Leola Brazil, DO   [facility] Providers Cardiologist:  Rollene Rotunda, MD     Referring MD: Leola Brazil, DO   Chief Complaint  Patient presents with   Follow-up    Seen for Dr. Antoine Poche    History of Present Illness:    Heidi Morse is a 66 y.o. female with a hx of CAD s/p CABG, aortic stenosis s/p AVR, postop atrial fibrillation, ischemic cardiomyopathy, PAD s/p left common femoral artery stenting 11/2017, bilateral carotid artery disease s/p left ICA stenting, HTN, HLD, DM II, OSA and COPD.  Endoscopy in December 2021 showed bleeding AVMs.  She was subsequently admitted in [facility] for anemia and a GI bleed.  Patient was evaluated at [facility], ED in March 21 2139 chest pain.  Her troponin negative, hemoglobin 7.6 and she was transfused 1 pack of red blood cell.  GI service was contacted who did not recommend any intervention.  She subsequently underwent left heart cath that showed a patent left main stent with distal moderate restenosis, haziness noted the via ostial LAD suggested moderate restenosis of the stent, severe restenosis of the ostial left circumflex stent.  Echocardiogram showed EF 45 to 50%, mild LVH, grade 2 DD, mild to moderate AI, moderate aortic stenosis and moderate MR.  She was seen by CT surgery and ultimately underwent CABG x2 with LIMA to LAD and SVG to OM as well as AVR on 04/03/2020 by Dr. Cliffton Asters.  Postop course was complicated by atrial fibrillation with RVR.  She was unable to tolerate amiodarone due to [place] allergies.  She was given digoxin and beta-blocker.  She subsequently converted to sinus rhythm and was not started on anticoagulation therapy.  Unfortunately, she had recurrent anginal symptom of increased shortness of breath and normal underwent another cath on 09/12/2020 that showed patent LIMA to LAD, 60% distal left main lesion,  40% proximal to mid RCA lesion, 99% ostial left circumflex lesion, 95% ostial lesion in SVG to OM treated with Synergy XD 3.5 x 20 mm DES.  Patient was last seen by Dr. Antoine Poche in July 2022.  Anemia and is currently being followed at [facility].  She is not on aspirin due to slow GI bleed.  Patient is currently on Plavix monotherapy.  Recently, reviewed preoperative clearance for enteroscopy, however due to recent stent placement, Dr. Antoine Poche did not recommend holding Plavix prior to December 2022.  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  She is being followed by hematology oncology service Dr. Allison Quarry of [facility].  She is receiving oral iron replacement therapy.  In the past week, patient has been noticing worsening dyspnea on exertion. She fell 2 days ago at the supermarket and bumped her left knee and right shoulder.  She is not sure why she fell.  Blood work obtained in early October shows her hemoglobin has increased to 11.6.  Given the fact that patient has a history of significant anemia, I recommended repeat blood work include CBC, TSH and basic metabolic panel to see if there is any secondary explanation for her dyspnea.  Otherwise her lungs is clear, she has no significant lower extremity pitting edema to suggest volume overload.  She is on appropriate medication include Lipitor, Plavix  and Lopressor.  She is adamant that she has been compliant with Plavix therapy.  EKG today shows no acute changes.  We will bring her back in 3 weeks for reassessment.    Past Medical History:  Diagnosis Date   Asthma    Bilateral carotid artery stenosis    CAD (coronary artery disease)    s/p CABG x2 in 04/2020   CHF (congestive heart failure) (HCC)    COPD (chronic obstructive pulmonary disease) (HCC)    Diabetes mellitus without complication  (HCC)    Hypertension    MI (myocardial infarction) (HCC)    PAD (peripheral artery disease) (HCC)    Sleep apnea     Past Surgical History:  Procedure Laterality Date   AORTIC ROOT ENLARGEMENT N/A 04/03/2020   Procedure: AORTIC ROOT ENLARGEMENT USING HEMASHIELD PLATINUM WOVEN DOUBLE VELOUR VASCULAR GRAFT 28 MM X 30 CM;  Surgeon: Corliss Skains, MD;  Location: [facility] OR;  Service: Open Heart Surgery;  Laterality: N/A;   AORTIC VALVE REPLACEMENT N/A 04/03/2020   Procedure: CORONARY ARTERY BYPASS GRAFTING X 2 ON CARDIOPULMONARY BYPASS. AORTIC VALVE REPLACEMENT USING 21 MM INSPIRIS RESILIA AORTIC VALVE, STERNAL PLATING;  Surgeon: Corliss Skains, MD;  Location: [facility] OR;  Service: Open Heart Surgery;  Laterality: N/A;  Coronary artery bypass grafting  Flow Trac   CORONARY STENT INTERVENTION N/A 09/12/2020   Procedure: CORONARY STENT INTERVENTION;  Surgeon: Corky Crafts, MD;  Location: [facility] INVASIVE CV LAB;  Service: Cardiovascular;  Laterality: N/A;   ENDOVEIN HARVEST OF GREATER SAPHENOUS VEIN Right 04/03/2020   Procedure: ENDOVEIN HARVEST OF GREATER SAPHENOUS VEIN;  Surgeon: Corliss Skains, MD;  Location: [facility] OR;  Service: Open Heart Surgery;  Laterality: Right;   LEFT HEART CATH AND CORONARY ANGIOGRAPHY N/A 03/30/2020   Procedure: LEFT HEART CATH AND CORONARY ANGIOGRAPHY;  Surgeon: Kathleene Hazel, MD;  Location: [facility] INVASIVE CV LAB;  Service: Cardiovascular;  Laterality: N/A;   RIGHT/LEFT HEART CATH AND CORONARY/GRAFT ANGIOGRAPHY N/A 09/12/2020   Procedure: RIGHT/LEFT HEART CATH AND CORONARY/GRAFT ANGIOGRAPHY;  Surgeon: Corky Crafts, MD;  Location: [facility] INVASIVE CV LAB;  Service: Cardiovascular;  Laterality: N/A;   TEE WITHOUT CARDIOVERSION N/A 04/03/2020   Procedure: TRANSESOPHAGEAL ECHOCARDIOGRAM (TEE);  Surgeon: Corliss Skains, MD;  Location: [facility] OR;  Service: Open Heart Surgery;  Laterality: N/A;    Current Medications: Current Meds  Medication Sig   albuterol  (VENTOLIN HFA) 108 (90 Base) MCG/ACT inhaler Inhale 2 puffs into the lungs every 6 (six) hours as needed for wheezing.   atorvastatin (LIPITOR) 80 MG tablet Take 80 mg by mouth at bedtime.   Capsaicin-Menthol (SALONPAS GEL-PATCH HOT EX) Apply 1 patch topically 2 (two) times daily. Knee pain   cholecalciferol (VITAMIN D3) 25 MCG (1000 UNIT) tablet Take 1,000 Units by mouth 2 (two) times daily.   clopidogrel (PLAVIX) 75 MG tablet Take 1 tablet (75 mg total) by mouth daily with breakfast.   ferrous sulfate 325 (65 FE) MG tablet Take 325 mg by mouth 2 (two) times daily.   Fluticasone-Salmeterol (ADVAIR) 250-50 MCG/DOSE AEPB Inhale 1 puff into the lungs in the morning and at bedtime.   gabapentin (NEURONTIN) 300 MG capsule Take 300 mg by mouth See admin instructions. Take one capsule (300 mg) by mouth twice daily - may also take 2 more times during the day as needed for pain   HYDROcodone-acetaminophen (NORCO) 10-325 MG tablet Take 1 tablet by mouth every 4 (four) hours as needed (pain).   hydrocortisone  2.5 % lotion Apply topically 2 (two) times daily.   ibuprofen (ADVIL) 800 MG tablet Take 800 mg by mouth every 6 (six) hours as needed.   insulin aspart (NOVOLOG) 100 UNIT/ML FlexPen Inject 7-14 Units into the skin in the morning and at bedtime. Dose based on CBG   insulin glargine (LANTUS) 100 UNIT/ML Solostar Pen Inject 24 Units into the skin 2 (two) times daily as needed (CBG >150).   metoprolol tartrate (LOPRESSOR) 25 MG tablet TAKE 1 AND 1/2 TABLETS BY MOUTH TWICE DAILY   montelukast (SINGULAIR) 10 MG tablet Take 10 mg by mouth every morning.   OXYGEN Inhale into the lungs as needed (shortness of breath).   valACYclovir (VALTREX) 500 MG tablet Take 500 mg by mouth 2 (two) times daily as needed (for flare ups).     Allergies:   Peanut oil, Peanut-containing drug products, Povidone-iodine, Shellfish allergy, Statins, and Iodine   Social History   Socioeconomic History   Marital status: Single     Spouse name: Not on file   Number of children: Not on file   Years of education: Not on file   Highest education level: Not on file  Occupational History   Not on file  Tobacco Use   Smoking status: Never   Smokeless tobacco: Never  Substance and Sexual Activity   Alcohol use: Not on file   Drug use: Not on file   Sexual activity: Not on file  Other Topics Concern   Not on file  Social History Narrative   Not on file   Social Determinants of Health   Financial Resource Strain: Not on file  Food Insecurity: Not on file  Transportation Needs: Not on file  Physical Activity: Not on file  Stress: Not on file  Social Connections: Not on file     Family History: The patient's family history is not on file.  ROS:   Please see the history of present illness.     All other systems reviewed and are negative.  EKGs/Labs/Other Studies Reviewed:    The following studies were reviewed today:  Echo 08/14/2020 1. Left ventricular ejection fraction, by estimation, is 50 to 55%. Left  ventricular ejection fraction by 3D volume is 51 %. The left ventricle has  low normal function. The left ventricle has no regional wall motion  abnormalities. There is mild  concentric left ventricular hypertrophy. Left ventricular diastolic  parameters are consistent with Grade II diastolic dysfunction  (pseudonormalization). Elevated left atrial pressure.   2. Right ventricular systolic function is mildly reduced. The right  ventricular size is normal. There is mildly elevated pulmonary artery  systolic pressure. The estimated right ventricular systolic pressure is  42.2 mmHg.   3. The mitral valve is grossly normal. Mild to moderate mitral valve  regurgitation. No evidence of mitral stenosis.   4. A 51mm Inspiris Resilia aortic valve bioprosthesis is present. There  is stable patient-prosthesis mismatch with AT 92, DI 0.33, EOA 1.05 iEOA  0.53. Mean gradient . There is trivial  paravalvular leak. . The  aortic valve has been  repaired/replaced. There is a 21 mm Inspiris bioprosthetic valve present  in the aortic position. Procedure Date: 04/03/20.   5. Patient is s/p aortic root enlargement. The appearance of the aortic  root and ascending aorta appears stable.   6. The inferior vena cava is normal in size with greater than 50%  respiratory variability, suggesting right atrial pressure of 3 mmHg.   Comparison(s): Compared to prior  TTE on 05/23/20, there is no significant  change.    Cath 09/12/2020 Dist LM to Ost LAD lesion is 60% stenosed. LIMA to LAD is widely patent. Prox RCA to Mid RCA lesion is 40% stenosed. Could not selectively engage from the left radial but appeared patent. Ost Cx lesion is 99% stenosed. In stent restenosis. Origin lesion is 95% stenosed in the SVG to OM. A drug-eluting stent was successfully placed using a SYNERGY XD 3.50X20, postdilated to > 4 mm. Post intervention, there is a 0% residual stenosis. Hemodynamic findings consistent with mild pulmonary hypertension. Ao sat 98%; PA sat 71%; PA pressure 40/15, mean PA 33 mm Hg; mean PCWP 15 mm Hg; CO 8.47 L/min; CI 4.3 A drug-eluting stent was successfully placed using a SYNERGY XD 3.50X20.   Successful PCI of the ostial vein graft to OM.  Given her issues with a slow GI bleed, will hold aspirin and use Plavix monotherapy.  She will have further work-up at [facility] with double-balloon enteroscopy.  We will continue iron supplementation.  We will have to base need for transfusion on hemoglobin in the morning.  Baseline hemoglobin of 9.2 and gradually trending down over the last few months.    EKG:  EKG is  ordered today.  The ekg ordered today demonstrates normal sinus rhythm, no significant ST-T wave changes  Recent Labs: 03/29/2020: ALT 12 04/04/2020: Magnesium 2.5 08/28/2020: B Natriuretic Peptide 199.0 01/25/2021: BUN 15; Creatinine, Ser 0.62; Hemoglobin 11.7; Platelets 201; Potassium 4.3;  Sodium 143; TSH 2.520  Recent Lipid Panel    Component Value Date/Time   CHOL 155 09/13/2020 0815   TRIG 60 09/13/2020 0815   HDL 43 09/13/2020 0815   CHOLHDL 3.6 09/13/2020 0815   VLDL 12 09/13/2020 0815   LDLCALC 100 (H) 09/13/2020 0815     Risk Assessment/Calculations:    CHA2DS2-VASc Score = 5   This indicates a 7.2% annual risk of stroke. The patient's score is based upon: CHF History: 1 HTN History: 1 Diabetes History: 1 Stroke History: 0 Vascular Disease History: 1 Age Score: 0 Gender Score: 1          Physical Exam:    VS:  BP 130/75   Pulse 62   Ht 5' (1.524 m)   Wt 232 lb 9.6 oz (105.5 kg)   LMP 04/01/2000 (Approximate)   SpO2 97%   BMI 45.43 kg/m     Wt Readings from Last 3 Encounters:  01/25/21 232 lb 9.6 oz (105.5 kg)  09/29/20 229 lb 6.4 oz (104.1 kg)  09/13/20 227 lb (103 kg)     GEN:  Well nourished, well developed in no acute distress HEENT: Normal NECK: No JVD; No carotid bruits LYMPHATICS: No lymphadenopathy CARDIAC: RRR, no murmurs, rubs, gallops RESPIRATORY:  Clear to auscultation without rales, wheezing or rhonchi  ABDOMEN: Soft, non-tender, non-distended MUSCULOSKELETAL:  No edema; No deformity  SKIN: Warm and dry NEUROLOGIC:  Alert and oriented x 3 PSYCHIATRIC:  Normal affect   ASSESSMENT:    1. SOB (shortness of breath)   2. Coronary artery disease involving coronary bypass graft of native heart without angina pectoris   3. S/P AVR (aortic valve replacement)   4. PAF (paroxysmal atrial fibrillation) (HCC)   5. Essential hypertension   6. Hyperlipidemia LDL goal <70   7. Controlled type 2 diabetes mellitus without complication, without long-term current use of insulin (HCC)   8. Iron deficiency anemia, unspecified iron deficiency anemia type    PLAN:  In order of problems listed above:  Shortness of breath with exertion: Symptom has been going on for the past week.  She has a history of anemia and coronary artery  disease.  She denies any chest pain.  I recommended TSH, basic metabolic panel and CBC as initial work-up.  If symptoms continue to worsen on the next follow-up, may need to consider ischemic work-up for anginal equivalent  CAD s/p CABG: Patient recently underwent DES to SVG to OM.  She has been compliant with Plavix monotherapy  History of AVR: Stable on last echocardiogram  History of postop atrial fibrillation: No recurrence  Hypertension: Blood pressure stable on current therapy  Hyperlipidemia: On Lipitor  DM2: Managed by primary care provider  History of recurrent anemia: Recently underwent argon plasma coagulation for multiple angioectasias at [facility].        Medication Adjustments/Labs and Tests Ordered: Current medicines are reviewed at length with the patient today.  Concerns regarding medicines are outlined above.  Orders Placed This Encounter  Procedures   TSH   Basic metabolic panel   CBC   EKG 12-Lead   No orders of the defined types were placed in this encounter.   Patient Instructions  Medication Instructions:  Your physician recommends that you continue on your current medications as directed. Please refer to the Current Medication list given to you today.  *If you need a refill on your cardiac medications before your next appointment, please call your pharmacy*   Lab Work: Your physician recommends that you return for lab work TODAY:  BMET CBC TSH If you have labs (blood work) drawn today and your tests are completely normal, you will receive your results only by: MyChart Message (if you have MyChart) OR A paper copy in the mail If you have any lab test that is abnormal or we need to change your treatment, we will call you to review the results.  Testing/Procedures: NONE ordered at this time of appointment   Follow-Up: At [facility], you and your health needs are our priority.  As part of our continuing mission to provide you with exceptional  heart care, we have created designated Provider Care Teams.  These Care Teams include your primary Cardiologist (physician) and Advanced Practice Providers (APPs -  Physician Assistants and Nurse Practitioners) who all work together to provide you with the care you need, when you need it.  We recommend signing up for the patient portal called "MyChart".  Sign up information is provided on this After Visit Summary.  MyChart is used to connect with patients for Virtual Visits (Telemedicine).  Patients are able to view lab/test results, encounter notes, upcoming appointments, etc.  Non-urgent messages can be sent to your provider as well.   To learn more about what you can do with MyChart, go to [email or web address].    Your next appointment:   3 week(s)  The format for your next appointment:   In Person  Provider:   APP  Other Instructions    Signed, Azalee Course, [place]  01/27/2021 9:21 PM    [facility]

## 2021-01-27 ENCOUNTER — Encounter: Payer: Self-pay | Admitting: Physician Assistant

## 2021-01-28 ENCOUNTER — Other Ambulatory Visit: Payer: Self-pay

## 2021-01-28 ENCOUNTER — Encounter (HOSPITAL_COMMUNITY): Payer: Self-pay | Admitting: Emergency Medicine

## 2021-01-28 ENCOUNTER — Emergency Department (HOSPITAL_COMMUNITY): Payer: Medicare Other

## 2021-01-28 ENCOUNTER — Emergency Department (HOSPITAL_COMMUNITY)
Admission: EM | Admit: 2021-01-28 | Discharge: 2021-01-29 | Disposition: A | Discharge status: Home / Self Care | Payer: Medicare Other | Attending: Emergency Medicine | Admitting: Emergency Medicine

## 2021-01-28 DIAGNOSIS — Z5321 Procedure and treatment not carried out due to patient leaving prior to being seen by health care provider: Secondary | ICD-10-CM | POA: Diagnosis not present

## 2021-01-28 DIAGNOSIS — R109 Unspecified abdominal pain: Secondary | ICD-10-CM | POA: Insufficient documentation

## 2021-01-28 DIAGNOSIS — R0600 Dyspnea, unspecified: Secondary | ICD-10-CM | POA: Insufficient documentation

## 2021-01-28 LAB — TROPONIN I (HIGH SENSITIVITY): Troponin I (High Sensitivity): 9 ng/L (ref <18)

## 2021-01-28 LAB — COMPREHENSIVE METABOLIC PANEL
ALT: 18 U/L (ref 0–44)
AST: 16 U/L (ref 15–41)
Albumin: 3.6 g/dL (ref 3.5–5.0)
Alkaline Phosphatase: 82 U/L (ref 38–126)
Anion gap: 8 (ref 5–15)
BUN: 17 mg/dL (ref 8–23)
CO2: 24 mmol/L (ref 22–32)
Calcium: 9.3 mg/dL (ref 8.9–10.3)
Chloride: 110 mmol/L (ref 98–111)
Creatinine, Ser: 0.71 mg/dL (ref 0.44–1.00)
GFR, Estimated: >60 mL/min (ref >60)
Glucose, Bld: 109 mg/dL — ABNORMAL HIGH (ref 70–99)
Potassium: 4.1 mmol/L (ref 3.5–5.1)
Sodium: 142 mmol/L (ref 135–145)
Total Bilirubin: 0.5 mg/dL (ref 0.3–1.2)
Total Protein: 7 g/dL (ref 6.5–8.1)

## 2021-01-28 LAB — CBC WITH DIFFERENTIAL/PLATELET
Abs Immature Granulocytes: 0.05 10*3/uL (ref 0.00–0.07)
Basophils Absolute: 0 10*3/uL (ref 0.0–0.1)
Basophils Relative: 0 %
Eosinophils Absolute: 0.1 10*3/uL (ref 0.0–0.5)
Eosinophils Relative: 1 %
HCT: 39.5 % (ref 36.0–46.0)
Hemoglobin: 12.1 g/dL (ref 12.0–15.0)
Immature Granulocytes: 1 %
Lymphocytes Relative: 21 %
Lymphs Abs: 2.1 10*3/uL (ref 0.7–4.0)
MCH: 25.7 pg — ABNORMAL LOW (ref 26.0–34.0)
MCHC: 30.6 g/dL (ref 30.0–36.0)
MCV: 83.9 fL (ref 80.0–100.0)
Monocytes Absolute: 0.9 10*3/uL (ref 0.1–1.0)
Monocytes Relative: 8 %
Neutro Abs: 7 10*3/uL (ref 1.7–7.7)
Neutrophils Relative %: 69 %
Platelets: 200 10*3/uL (ref 150–400)
RBC: 4.71 MIL/uL (ref 3.87–5.11)
RDW: 17.7 % — ABNORMAL HIGH (ref 11.5–15.5)
WBC: 10.2 10*3/uL (ref 4.0–10.5)
nRBC: 0 % (ref 0.0–0.2)

## 2021-01-28 LAB — URINALYSIS, ROUTINE W REFLEX MICROSCOPIC
Bilirubin Urine: NEGATIVE
Glucose, UA: NEGATIVE mg/dL
Hgb urine dipstick: NEGATIVE
Ketones, ur: NEGATIVE mg/dL
Leukocytes,Ua: NEGATIVE
Nitrite: NEGATIVE
Protein, ur: NEGATIVE mg/dL
Specific Gravity, Urine: 1.019 (ref 1.005–1.030)
pH: 5 (ref 5.0–8.0)

## 2021-01-28 LAB — BRAIN NATRIURETIC PEPTIDE: B Natriuretic Peptide: 89.1 pg/mL (ref 0.0–100.0)

## 2021-01-28 LAB — LIPASE, BLOOD: Lipase: 39 U/L (ref 11–51)

## 2021-01-28 NOTE — ED Provider Notes (Signed)
Emergency Medicine Provider Triage Evaluation Note  Heidi Morse , a 65 y.o. female  was evaluated in triage.  Pt complains of 2 complaints. 1: Fall, she had a reported mechanical nonsyncopal fall about 3 days ago.  She landed on her right side however has had left-sided flank pain since.  This worsens with any movement.  She denies any urinary symptoms. 2: Dyspnea with exertion.  She reports that over the past few days she has had worsening dyspnea with any exertion.  She was seen by Dr. Antoine Poche, her heart doctor who reported that she had EKG changes and did blood work and evaluation per her report, however note from PA from that visit shows no acute changes on the EKG.  Review of Systems  Positive: Dyspnea with exertion, left-sided flank pain, fall Negative: Anticoagulants  Physical Exam  BP (!) 156/65   Pulse 68   Temp 98.4 F (36.9 C) (Oral)   Resp 16   LMP 04/01/2000 (Approximate)   SpO2 100%  Gen:   Awake, no distress   Resp:  Normal effort  MSK:   Moves extremities without difficulty  Other:  Plus DP pulses bilaterally.  Sensation intact to light touch to bilateral feet.  There is tenderness to palpation over the left lateral flank and mild over the midline L-spine.  No obvious edema or dyspnea.   Medical Decision Making  Medically screening exam initiated at 8:26 PM.  Appropriate orders placed.  Thomasa Heidler was informed that the remainder of the evaluation will be completed by another provider, this initial triage assessment does not replace that evaluation, and the importance of remaining in the ED until their evaluation is complete.  Note: Portions of this report may have been transcribed using voice recognition software. Every effort was made to ensure accuracy; however, inadvertent computerized transcription errors may be present    Norman Clay 01/28/21 2028    Tegeler, Canary Brim, MD 01/28/21 2056

## 2021-01-28 NOTE — ED Triage Notes (Addendum)
Patient reports left flank pain for 3 days pain increases with movement/changing positions , no hematuria or dysuria , patient stated that she fell last week , she added exertional dyspnea.

## 2021-01-29 NOTE — ED Notes (Signed)
Pt decided to leave 

## 2021-01-30 NOTE — Progress Notes (Signed)
Red blood cell count continue to improve. Stable renal function and electrolyte. Normal thyroid level.

## 2021-02-20 ENCOUNTER — Ambulatory Visit: Payer: Medicare Other | Admitting: General Practice

## 2021-02-26 NOTE — Progress Notes (Signed)
Cardiology Clinic Note   Patient Name: [name] Date of Encounter: 03/01/2021  Primary Care Provider:  Nicola Girt, DO Primary Cardiologist:  Minus Breeding, MD  Patient Profile    Heidi Morse 65 year old female presents to the clinic today for follow-up evaluation of her shortness of breath.  Past Medical History    Past Medical History:  Diagnosis Date   Asthma    Bilateral carotid artery stenosis    CAD (coronary artery disease)    s/p CABG x2 in 04/2020   CHF (congestive heart failure) (HCC)    COPD (chronic obstructive pulmonary disease) (HCC)    Diabetes mellitus without complication ([place])    Hypertension    MI (myocardial infarction) ([place])    PAD (peripheral artery disease) ([place])    Sleep apnea    Past Surgical History:  Procedure Laterality Date   AORTIC ROOT ENLARGEMENT N/A 04/03/2020   Procedure: AORTIC ROOT ENLARGEMENT USING HEMASHIELD PLATINUM WOVEN DOUBLE VELOUR VASCULAR GRAFT 28 MM X 30 CM;  Surgeon: Lajuana Matte, MD;  Location: [facility];  Service: Open Heart Surgery;  Laterality: N/A;   AORTIC VALVE REPLACEMENT N/A 04/03/2020   Procedure: CORONARY ARTERY BYPASS GRAFTING X 2 ON CARDIOPULMONARY BYPASS. AORTIC VALVE REPLACEMENT USING 21 MM INSPIRIS RESILIA AORTIC VALVE, STERNAL PLATING;  Surgeon: Lajuana Matte, MD;  Location: [facility];  Service: Open Heart Surgery;  Laterality: N/A;  Coronary artery bypass grafting  Flow Trac   CORONARY STENT INTERVENTION N/A 09/12/2020   Procedure: CORONARY STENT INTERVENTION;  Surgeon: Jettie Booze, MD;  Location: [facility];  Service: Cardiovascular;  Laterality: N/A;   ENDOVEIN HARVEST OF GREATER SAPHENOUS VEIN Right 04/03/2020   Procedure: ENDOVEIN HARVEST OF GREATER SAPHENOUS VEIN;  Surgeon: Lajuana Matte, MD;  Location: [facility];  Service: Open Heart Surgery;  Laterality: Right;   LEFT HEART CATH AND CORONARY ANGIOGRAPHY N/A 03/30/2020   Procedure: LEFT HEART CATH AND CORONARY  ANGIOGRAPHY;  Surgeon: Burnell Blanks, MD;  Location: [facility];  Service: Cardiovascular;  Laterality: N/A;   RIGHT/LEFT HEART CATH AND CORONARY/GRAFT ANGIOGRAPHY N/A 09/12/2020   Procedure: RIGHT/LEFT HEART CATH AND CORONARY/GRAFT ANGIOGRAPHY;  Surgeon: Jettie Booze, MD;  Location: [facility];  Service: Cardiovascular;  Laterality: N/A;   TEE WITHOUT CARDIOVERSION N/A 04/03/2020   Procedure: TRANSESOPHAGEAL ECHOCARDIOGRAM (TEE);  Surgeon: Lajuana Matte, MD;  Location: [facility];  Service: Open Heart Surgery;  Laterality: N/A;    Allergies  Allergies  Allergen Reactions   Peanut Oil Swelling and Hives   Povidone-Iodine Other (See Comments)    Unknown reaction  PT STATES SHE HEARD HER DERMATOLOGIST MENTION THEY WERE GLAD THEY DID NOT USE THIS DURING A PROCEDURE, PT STATES SHE IS REALLY NOT SURE WHY.    Shellfish Allergy Other (See Comments)    Unknown reaction   Statins Other (See Comments)    myalgia   Iodine Itching and Rash    History of Present Illness    Heidi Morse has a PMH of coronary artery disease status post CABG, aortic stenosis status post AVR, postoperative atrial fibrillation, ischemic cardiomyopathy, peripheral arterial disease status post left common femoral artery stenting 9/19, bilateral carotid artery disease status post left ICA stenting, hypertension, hyperlipidemia, diabetes mellitus type 2, OSA, and COPD.  She underwent endoscopy 12/21 which showed bleeding AVMs.  She was admitted at [facility] for anemia and GI bleed.  She was evaluated at [facility] emergency department 12/20 for chest  pain.  Her troponins were negative, hemoglobin 7.6, and she received 1 unit of PRBCs.  GI was consulted.  She underwent left heart cath which showed a patent left main stent and distal moderate restenosis, severe restenosis of the ostial left circumflex stent.  Her echocardiogram showed an EF of 45-50%, mild LVH, G2 DD, mild-moderate AI, moderate  aortic stenosis and moderate MR.  She was seen and evaluated by CT surgery and underwent CABG x2 with LIMA-LAD, and SVG-OM as well as AVR by Dr. Cliffton Asters on 04/03/2020.  Her postoperative course was complicated by atrial fibrillation with RVR.  She was unable to tolerate amiodarone due to [place] allergies.  She was started on digoxin and beta-blocker therapy.  She converted to sinus rhythm was started on anticoagulation at that time.  She had recurrent anginal symptoms and increased shortness of breath.  She underwent repeat cardiac catheterization 6/22 which showed patent LIMA-LAD, 60% distal left main, 40% proximal mid RCA, 99% ostial left circumflex, 95% ostial lesion in her SVG-OM and received DES x1.  She followed up with Dr. Antoine Poche 7/22.  Her anemia was being followed by [facility].  She is not on aspirin due to GI bleed.  She was taking Plavix monotherapy.  She had been reviewed for preoperative clearance for endoscopy.  However due to recent stent placement Dr. Antoine Poche did not recommend holding Plavix until December 2022.  She underwent endoscopy 12/22/2020 at [facility] which revealed 3 bleeding areas in her duodenum which were treated with APC, a single bleeding area in her jejunum also treated with APC.  She follows with hematology oncology Dr. Allison Quarry at [facility].  She had been placed on oral iron therapy.  She was seen by Azalee Course PA-C on 01/25/2021.  During that time she was noticing worsening dyspnea on exertion.  She had fallen 2 days prior at the supermarket and bumped her left knee and right shoulder.  She was not sure why she fell.  Her blood work obtained in October showed a hemoglobin had increased to 11.6.  Due to her significant history of anemia repeat blood work including CBC, TSH and BMP were ordered.  Her lungs were clear and she did not have any significant lower extremity swelling.  She reported compliance with her Lipitor, Plavix, and Lopressor.  Her EKG showed no acute changes.  Follow-up  was planned for 3 weeks.  Her blood work remained stable with a hemoglobin of 12.2.  All of her other labs from 01/28/2021 were unremarkable.  She presents to the clinic today for follow-up evaluation states she has been increasing her physical activity.  She has been doing linen changes, mopping, and other house chores.  She starts at the back of her house looks forward.  She is somewhat limited in her physical activity due to back pain and knee pain.  She is working with pain management.  Her breathing continues to get better.  We reviewed her lab work from her previous visit and she expressed understanding.  I have asked her to continue to lose weight, increase her physical activity as tolerated, and follow-up in 3-4 months.  Today she denies chest pain, increased shortness of breath, lower extremity edema, fatigue, palpitations, melena, hematuria, hemoptysis, diaphoresis, weakness, presyncope, syncope, orthopnea, and PND.   Home Medications    Prior to Admission medications   Medication Sig Start Date End Date Taking? Authorizing Provider  albuterol (VENTOLIN HFA) 108 (90 Base) MCG/ACT inhaler Inhale 2 puffs into the lungs every 6 (six)  hours as needed for wheezing. 03/09/20   [provider]  amoxicillin (AMOXIL) 500 MG capsule Take 4 capsules (2,000 mg total) by mouth as needed (before dental work or other procedures). Patient not taking: Reported on 01/25/2021 09/14/20 09/14/21  Barrett, Evelene Croon, PA-C  atorvastatin (LIPITOR) 80 MG tablet Take 80 mg by mouth at bedtime. 03/09/20   [provider]  Capsaicin-Menthol (SALONPAS GEL-PATCH HOT EX) Apply 1 patch topically 2 (two) times daily. Knee pain    [provider]  cholecalciferol (VITAMIN D3) 25 MCG (1000 UNIT) tablet Take 1,000 Units by mouth 2 (two) times daily.    [provider]  clopidogrel (PLAVIX) 75 MG tablet Take 1 tablet (75 mg total) by mouth daily with breakfast. 12/29/20   Minus Breeding, MD   empagliflozin (JARDIANCE) 10 MG TABS tablet Take 1 tablet (10 mg total) by mouth daily before breakfast. Patient not taking: Reported on 01/25/2021 09/14/20   Barrett, Evelene Croon, PA-C  ferrous sulfate 325 (65 FE) MG tablet Take 325 mg by mouth 2 (two) times daily. 03/24/20   [provider]  Fluticasone-Salmeterol (ADVAIR) 250-50 MCG/DOSE AEPB Inhale 1 puff into the lungs in the morning and at bedtime. 01/27/20   [provider]  gabapentin (NEURONTIN) 300 MG capsule Take 300 mg by mouth See admin instructions. Take one capsule (300 mg) by mouth twice daily - may also take 2 more times during the day as needed for pain 02/01/20   [provider]  guaiFENesin (MUCINEX) 600 MG 12 hr tablet Take by mouth.    [provider]  HYDROcodone-acetaminophen (NORCO) 10-325 MG tablet Take 1 tablet by mouth every 4 (four) hours as needed (pain).    [provider]  hydrocortisone 2.5 % lotion Apply topically 2 (two) times daily. 10/07/20   Rayna Sexton, PA-C  ibuprofen (ADVIL) 800 MG tablet Take 800 mg by mouth every 6 (six) hours as needed. 09/28/20   [provider]  insulin aspart (NOVOLOG) 100 UNIT/ML FlexPen Inject 7-14 Units into the skin in the morning and at bedtime. Dose based on CBG    [provider]  insulin glargine (LANTUS) 100 UNIT/ML Solostar Pen Inject 24 Units into the skin 2 (two) times daily as needed (CBG >150).    [provider]  metoprolol tartrate (LOPRESSOR) 25 MG tablet TAKE 1 AND 1/2 TABLETS BY MOUTH TWICE DAILY 01/18/21   Minus Breeding, MD  montelukast (SINGULAIR) 10 MG tablet Take 10 mg by mouth every morning. 01/05/20   [provider]  nitroGLYCERIN (NITROSTAT) 0.4 MG SL tablet PLACE 1 TABLET UNDER THE TONGUE EVERY 5 MINUTES AS NEEDED FOR CHEST PAIN. Patient not taking: Reported on 01/25/2021 11/27/20   Minus Breeding, MD  OXYGEN Inhale into the lungs as needed (shortness of breath).    [provider]  pantoprazole (PROTONIX) 40 MG tablet Take 1 tablet (40 mg total) by mouth daily. Patient not taking: Reported on 01/25/2021 11/27/20   Minus Breeding, MD  valACYclovir (VALTREX) 500 MG tablet Take 500 mg by mouth 2 (two) times daily as needed (for flare ups).    [provider]    Family History    History reviewed. No pertinent family history. has no family status information on file.   Social History    Social History   Socioeconomic History   Marital status: Single    Spouse name: Not on file   Number of children: Not on file   Years of education: Not  on file   Highest education level: Not on file  Occupational History   Not on file  Tobacco Use   Smoking status: Never   Smokeless tobacco: Never  Substance and Sexual Activity   Alcohol use: Not on file   Drug use: Not on file   Sexual activity: Not on file  Other Topics Concern   Not on file  Social History Narrative   Not on file   Social Determinants of Health   Financial Resource Strain: Not on file  Food Insecurity: Not on file  Transportation Needs: Not on file  Physical Activity: Not on file  Stress: Not on file  Social Connections: Not on file  Intimate Partner Violence: Not on file     Review of Systems    General:  No chills, fever, night sweats or weight changes.  Cardiovascular:  No chest pain, dyspnea on exertion, edema, orthopnea, palpitations, paroxysmal nocturnal dyspnea. Dermatological: No rash, lesions/masses Respiratory: No cough, dyspnea Urologic: No hematuria, dysuria Abdominal:   No nausea, vomiting, diarrhea, bright red blood per rectum, melena, or hematemesis Neurologic:  No visual changes, wkns, changes in mental status. All other systems reviewed and are otherwise negative except as noted above.  Physical Exam    VS:  BP 124/82   Pulse 79   Ht 5' (1.524 m)   Wt 239 lb (108.4 kg)   LMP 04/01/2000 (Approximate)   SpO2 99%   BMI 46.68 kg/m  , BMI Body  mass index is 46.68 kg/m. GEN: Well nourished, well developed, in no acute distress. HEENT: normal. Neck: Supple, no JVD, carotid bruits, or masses. Cardiac: RRR, no murmurs, rubs, or gallops. No clubbing, cyanosis, edema.  Radials/DP/PT 2+ and equal bilaterally.  Respiratory:  Respirations regular and unlabored, clear to auscultation bilaterally. GI: Soft, nontender, nondistended, BS + x 4. MS: no deformity or atrophy. Skin: warm and dry, no rash. Neuro:  Strength and sensation are intact. Psych: Normal affect.  Accessory Clinical Findings    Recent Labs: 04/04/2020: Magnesium 2.5 01/25/2021: TSH 2.520 01/28/2021: ALT 18; B Natriuretic Peptide 89.1; BUN 17; Creatinine, Ser 0.71; Hemoglobin 12.1; Platelets 200; Potassium 4.1; Sodium 142   Recent Lipid Panel    Component Value Date/Time   CHOL 155 09/13/2020 0815   TRIG 60 09/13/2020 0815   HDL 43 09/13/2020 0815   CHOLHDL 3.6 09/13/2020 0815   VLDL 12 09/13/2020 0815   LDLCALC 100 (H) 09/13/2020 0815    ECG personally reviewed by me today-none today.  Echocardiogram 08/14/2020  IMPRESSIONS     1. Left ventricular ejection fraction, by estimation, is 50 to 55%. Left  ventricular ejection fraction by 3D volume is 51 %. The left ventricle has  low normal function. The left ventricle has no regional wall motion  abnormalities. There is mild  concentric left ventricular hypertrophy. Left ventricular diastolic  parameters are consistent with Grade II diastolic dysfunction  (pseudonormalization). Elevated left atrial pressure.   2. Right ventricular systolic function is mildly reduced. The right  ventricular size is normal. There is mildly elevated pulmonary artery  systolic pressure. The estimated right ventricular systolic pressure is  [id number] mmHg.   3. The mitral valve is grossly normal. Mild to moderate mitral valve  regurgitation. No evidence of mitral stenosis.   4. A 41mm Inspiris Resilia aortic valve bioprosthesis is  present. There  is stable patient-prosthesis mismatch with AT 92, DI 0.33, EOA 1.05 iEOA  0.53. Mean gradient 22mmHg. There is trivial paravalvular leak. . The  aortic valve has been  repaired/replaced. There is a 21 mm Inspiris bioprosthetic valve present  in the aortic position. Procedure Date: 04/03/20.   5. Patient is s/p aortic root enlargement. The appearance of the aortic  root and ascending aorta appears stable.   6. The inferior vena cava is normal in size with greater than 50%  respiratory variability, suggesting right atrial pressure of 3 mmHg.   Comparison(s): Compared to prior TTE on 05/23/20, there is no significant  change.   Cardiac catheterization 09/12/2020 Dist LM to Ost LAD lesion is 60% stenosed. LIMA to LAD is widely patent. Prox RCA to Mid RCA lesion is 40% stenosed. Could not selectively engage from the left radial but appeared patent. Ost Cx lesion is 99% stenosed. In stent restenosis. Origin lesion is 95% stenosed in the SVG to OM. A drug-eluting stent was successfully placed using a SYNERGY XD 3.50X20, postdilated to > 4 mm. Post intervention, there is a 0% residual stenosis. Hemodynamic findings consistent with mild pulmonary hypertension. Ao sat 98%; PA sat 71%; PA pressure 40/15, mean PA 33 mm Hg; mean PCWP 15 mm Hg; CO 8.47 L/min; CI 4.3 A drug-eluting stent was successfully placed using a SYNERGY XD 3.50X20.   Successful PCI of the ostial vein graft to OM.  Given her issues with a slow GI bleed, will hold aspirin and use Plavix monotherapy.  She will have further work-up at [facility] with double-balloon enteroscopy.  We will continue iron supplementation.  We will have to base need for transfusion on hemoglobin in the morning.  Baseline hemoglobin of 9.2 and gradually trending down over the last few months. Assessment & Plan   1.  Shortness of breath-continues to improve.  She has now changing linens, mopping, vacuuming, and doing other types of housework.   Significant history of anemia and coronary artery disease.  Follows with [facility] for GI and [facility] for hematology oncology.  Lab work drawn on 01/28/2021 was unremarkable.  Hemoglobin stable.  No signs of HF.  Appears to be related to body habitus and decreased conditioning. Continue to monitor.  No plans for ischemic evaluation at this time. Increase physical activity as tolerated Continue iron supplementation Heart healthy low-sodium diet  Coronary artery disease-status post CABG x2.  Underwent cardiac catheterization and received DES x1 to SVG-OM.  Details above.  On Plavix monotherapy due to GI bleeding. Continue Plavix Heart healthy low-sodium diet-salty 6 given Increase physical activity as tolerated  Status post AVR-breathing stable no increased activity intolerance.  Echocardiogram showed stable AVR 08/14/2020. Continue to monitor  Essential hypertension-BP today 124/82.  Well-controlled at home. Continue metoprolol Heart healthy low-sodium diet-salty 6 given Increase physical activity as tolerated  Hyperlipidemia-09/13/2020: Cholesterol 155; HDL 43; LDL Cholesterol 100; Triglycerides 60; VLDL 12 Continue atorvastatin Heart healthy low-sodium high-fiber diet Increase physical activity as tolerated  Disposition: Follow-up with Dr. Percival Spanish or Almyra Deforest PA-C in 3-4 months.  Jossie Ng. Deaundra Dupriest NP-C    03/01/2021, 8:16 AM [address] Office [phone] Fax [phone]  Notice: This dictation was prepared with Dragon dictation along with smaller phrase technology. Any transcriptional errors that result from this process are unintentional and may not be corrected upon review.  I spent 13 minutes examining this patient, reviewing medications, and using patient centered shared decision making involving her cardiac care.  Prior to her visit I spent greater than 20 minutes reviewing her past medical history,  medications, and prior  cardiac tests.

## 2021-03-01 ENCOUNTER — Other Ambulatory Visit: Payer: Self-pay

## 2021-03-01 ENCOUNTER — Encounter: Payer: Self-pay | Admitting: General Practice

## 2021-03-01 ENCOUNTER — Ambulatory Visit (INDEPENDENT_AMBULATORY_CARE_PROVIDER_SITE_OTHER): Payer: Medicare Other | Admitting: General Practice

## 2021-03-01 VITALS — BP 124/82 | HR 79 | Ht 60.0 in | Wt 239.0 lb

## 2021-03-01 DIAGNOSIS — R0602 Shortness of breath: Secondary | ICD-10-CM | POA: Diagnosis not present

## 2021-03-01 DIAGNOSIS — Z952 Presence of prosthetic heart valve: Secondary | ICD-10-CM | POA: Diagnosis not present

## 2021-03-01 DIAGNOSIS — I10 Essential (primary) hypertension: Secondary | ICD-10-CM

## 2021-03-01 DIAGNOSIS — I25810 Atherosclerosis of coronary artery bypass graft(s) without angina pectoris: Secondary | ICD-10-CM

## 2021-03-01 DIAGNOSIS — E785 Hyperlipidemia, unspecified: Secondary | ICD-10-CM

## 2021-03-01 NOTE — Patient Instructions (Signed)
Medication Instructions:  The current medical regimen is effective;  continue present plan and medications as directed. Please refer to the Current Medication list given to you today.   *If you need a refill on your cardiac medications before your next appointment, please call your pharmacy*  Lab Work:   Testing/Procedures:  NONE    NONE  Special Instructions PLEASE READ AND FOLLOW SALTY 6-ATTACHED-1,800mg  daily  PLEASE MAINTAIN PHYSICAL ACTIVITY AS TOLERATED   MAKE SURE TO KEEP YOUR PAIN MANAGEMENT APPOINTMENT  PLEASE READ AND FOLLOW FIBER AND CALORIE RESTRICTED DIET (1,500/DAY)-ATTACHED  Follow-Up: Your next appointment:  3-4 month(s) In Person with Rollene Rotunda, MD  or Azalee Course, PA-C     :1  At [facility], you and your health needs are our priority.  As part of our continuing mission to provide you with exceptional heart care, we have created designated Provider Care Teams.  These Care Teams include your primary Cardiologist (physician) and Advanced Practice Providers (APPs -  Physician Assistants and Nurse Practitioners) who all work together to provide you with the care you need, when you need it.  We recommend signing up for the patient portal called "MyChart".  Sign up information is provided on this After Visit Summary.  MyChart is used to connect with patients for Virtual Visits (Telemedicine).  Patients are able to view lab/test results, encounter notes, upcoming appointments, etc.  Non-urgent messages can be sent to your provider as well.   To learn more about what you can do with MyChart, go to [email or web address].              6 SALTY THINGS TO AVOID     1,800MG  DAILY      High-Fiber Eating Plan Fiber, also called dietary fiber, is a type of carbohydrate. It is found foods such as fruits, vegetables, whole grains, and beans. A high-fiber diet can have many health benefits. Your health care provider may recommend a high-fiber diet to help: Prevent  constipation. Fiber can make your bowel movements more regular. Lower your cholesterol. Relieve the following conditions: Inflammation of veins in the anus (hemorrhoids). Inflammation of specific areas of the digestive tract (uncomplicated diverticulosis). A problem of the large intestine, also called the colon, that sometimes causes pain and diarrhea (irritable bowel syndrome, or IBS). Prevent overeating as part of a weight-loss plan. Prevent heart disease, type 2 diabetes, and certain cancers. What are tips for following this plan? Reading food labels  Check the nutrition facts label on food products for the amount of dietary fiber. Choose foods that have 5 grams of fiber or more per serving. The goals for recommended daily fiber intake include: Men (age 57 or younger): 34-38 g. Men (over age 19): 28-34 g. Women (age 56 or younger): 25-28 g. Women (over age 19): 22-25 g. Your daily fiber goal is _____________ g. Shopping Choose whole fruits and vegetables instead of processed forms, such as apple juice or applesauce. Choose a wide variety of high-fiber foods such as avocados, lentils, oats, and kidney beans. Read the nutrition facts label of the foods you choose. Be aware of foods with added fiber. These foods often have high sugar and sodium amounts per serving. Cooking Use whole-grain flour for baking and cooking. Cook with brown rice instead of white rice. Meal planning Start the day with a breakfast that is high in fiber, such as a cereal that contains 5 g of fiber or more per serving. Eat breads and cereals that are made with whole-grain flour  instead of refined flour or white flour. Eat brown rice, bulgur wheat, or millet instead of white rice. Use beans in place of meat in soups, salads, and pasta dishes. Be sure that half of the grains you eat each day are whole grains. General information You can get the recommended daily intake of dietary fiber by: Eating a variety of  fruits, vegetables, grains, nuts, and beans. Taking a fiber supplement if you are not able to take in enough fiber in your diet. It is better to get fiber through food than from a supplement. Gradually increase how much fiber you consume. If you increase your intake of dietary fiber too quickly, you may have bloating, cramping, or gas. Drink plenty of water to help you digest fiber. Choose high-fiber snacks, such as berries, raw vegetables, nuts, and popcorn. What foods should I eat? Fruits Berries. Pears. Apples. Oranges. Avocado. Prunes and raisins. Dried figs. Vegetables Sweet potatoes. Spinach. Kale. Artichokes. Cabbage. Broccoli. Cauliflower. Green peas. Carrots. Squash. Grains Whole-grain breads. Multigrain cereal. Oats and oatmeal. Brown rice. Barley. Bulgur wheat. Millet. Quinoa. Bran muffins. Popcorn. Rye wafer crackers. Meats and other proteins Navy beans, kidney beans, and pinto beans. Soybeans. Split peas. Lentils. Nuts and seeds. Dairy Fiber-fortified yogurt. Beverages Fiber-fortified soy milk. Fiber-fortified orange juice. Other foods Fiber bars. The items listed above may not be a complete list of recommended foods and beverages. Contact a dietitian for more information. What foods should I avoid? Fruits Fruit juice. Cooked, strained fruit. Vegetables Fried potatoes. Canned vegetables. Well-cooked vegetables. Grains White bread. Pasta made with refined flour. White rice. Meats and other proteins Fatty cuts of meat. Fried chicken or fried fish. Dairy Milk. Yogurt. Cream cheese. Sour cream. Fats and oils Butters. Beverages Soft drinks. Other foods Cakes and pastries. The items listed above may not be a complete list of foods and beverages to avoid. Talk with your dietitian about what choices are best for you. Summary Fiber is a type of carbohydrate. It is found in foods such as fruits, vegetables, whole grains, and beans. A high-fiber diet has many benefits. It  can help to prevent constipation, lower blood cholesterol, aid weight loss, and reduce your risk of heart disease, diabetes, and certain cancers. Increase your intake of fiber gradually. Increasing fiber too quickly may cause cramping, bloating, and gas. Drink plenty of water while you increase the amount of fiber you consume. The best sources of fiber include whole fruits and vegetables, whole grains, nuts, seeds, and beans. This information is not intended to replace advice given to you by your health care provider. Make sure you discuss any questions you have with your health care provider. Document Revised: 07/22/2019 Document Reviewed: 07/22/2019 Elsevier Patient Education  2022 [employer].

## 2021-03-09 ENCOUNTER — Telehealth: Payer: Self-pay | Admitting: Cardiology

## 2021-03-09 NOTE — Telephone Encounter (Signed)
Heidi Morse is calling stating for the past two weeks she has been having severe leg cramps. In the last week her mouth has been twitching and her body has been jerking. She saw someone at her PCP office and they advised her to call our office in regards to this. Patient discussed with PCP office that she thought it maybe her atorvastatin, but they advised they did not believe it would be that due to her just having these issues with her being on the medication for over a year and discussed it possibly being low potassium or something to that nature. She states her PCP office advised her to stay on this medication due to this. Heidi Morse reports she was told awhile back she is allergic to simvastatin and the pharmacy brought this to her attention when she lasted picked up her atorvastatin due to them both being in the statin family.

## 2021-03-09 NOTE — Telephone Encounter (Signed)
Called pt with Dr. Jenene Slicker recommendations. She verbalized understanding, she will reach back out around 12/26 with an update of her symptoms.

## 2021-03-09 NOTE — Telephone Encounter (Signed)
Spoke to pt she states "it does not happen everyday, it has happened about 3 days out of the week for 2 weeks." She states this past week she has had body twitches. Pt was advised by PCP it could be low potassium. She had labs drawn yesterday. Potassium 4.4, Mag 1.7. Pt was advised to reach out to cardiologist in regards to changing medication. Will relay message to Dr. Antoine Poche for advice.

## 2021-03-27 NOTE — Telephone Encounter (Signed)
Patient called to f/u about her symptoms. She states that the cramping in her legs , hands and feet has gone away since stopping the medication. She said she still gets the occasional jerks in her hands. She said she actually dropped a saucer on the floor.  She is not sure what else to do

## 2021-03-28 NOTE — Telephone Encounter (Signed)
Spoke with patient regarding her occasional jerks in her hands. She stated she no longer has leg cramps since stopping the Lipitor, but reports twitching in her hands and lips. She is taking all prescribed medications. Consulted with pharmacist Belenda Cruise about atorvastatin. She recommended the patient take 40 mg for two weeks and to let us know how she is tolerating it. I recommended that patient call her PCP Dr. Richrd Sox regarding her twitching. Patient verbalized understanding of her new dose of atorvastatin and to call her PCP.

## 2021-04-16 ENCOUNTER — Encounter (HOSPITAL_COMMUNITY): Payer: Self-pay

## 2021-04-16 ENCOUNTER — Emergency Department (HOSPITAL_COMMUNITY)
Admission: EM | Admit: 2021-04-16 | Discharge: 2021-04-16 | Disposition: A | Discharge status: Home / Self Care | Payer: Medicare Other | Attending: Emergency Medicine | Admitting: Emergency Medicine

## 2021-04-16 ENCOUNTER — Emergency Department (HOSPITAL_BASED_OUTPATIENT_CLINIC_OR_DEPARTMENT_OTHER): Payer: Medicare Other

## 2021-04-16 ENCOUNTER — Emergency Department (HOSPITAL_COMMUNITY): Payer: Medicare Other

## 2021-04-16 ENCOUNTER — Other Ambulatory Visit: Payer: Self-pay

## 2021-04-16 DIAGNOSIS — Z7901 Long term (current) use of anticoagulants: Secondary | ICD-10-CM | POA: Diagnosis not present

## 2021-04-16 DIAGNOSIS — Z79899 Other long term (current) drug therapy: Secondary | ICD-10-CM | POA: Diagnosis not present

## 2021-04-16 DIAGNOSIS — I110 Hypertensive heart disease with heart failure: Secondary | ICD-10-CM | POA: Insufficient documentation

## 2021-04-16 DIAGNOSIS — J449 Chronic obstructive pulmonary disease, unspecified: Secondary | ICD-10-CM | POA: Diagnosis not present

## 2021-04-16 DIAGNOSIS — Z20822 Contact with and (suspected) exposure to covid-19: Secondary | ICD-10-CM | POA: Insufficient documentation

## 2021-04-16 DIAGNOSIS — Z794 Long term (current) use of insulin: Secondary | ICD-10-CM | POA: Insufficient documentation

## 2021-04-16 DIAGNOSIS — Z91010 Allergy to peanuts: Secondary | ICD-10-CM | POA: Insufficient documentation

## 2021-04-16 DIAGNOSIS — M7989 Other specified soft tissue disorders: Secondary | ICD-10-CM | POA: Diagnosis not present

## 2021-04-16 DIAGNOSIS — M79604 Pain in right leg: Secondary | ICD-10-CM

## 2021-04-16 DIAGNOSIS — I509 Heart failure, unspecified: Secondary | ICD-10-CM | POA: Diagnosis not present

## 2021-04-16 DIAGNOSIS — M25562 Pain in left knee: Secondary | ICD-10-CM | POA: Diagnosis present

## 2021-04-16 DIAGNOSIS — E119 Type 2 diabetes mellitus without complications: Secondary | ICD-10-CM | POA: Diagnosis not present

## 2021-04-16 LAB — CBC WITH DIFFERENTIAL/PLATELET
Abs Immature Granulocytes: 0.03 10*3/uL (ref 0.00–0.07)
Basophils Absolute: 0 10*3/uL (ref 0.0–0.1)
Basophils Relative: 1 %
Eosinophils Absolute: 0.1 10*3/uL (ref 0.0–0.5)
Eosinophils Relative: 1 %
HCT: 35 % — ABNORMAL LOW (ref 36.0–46.0)
Hemoglobin: 10.3 g/dL — ABNORMAL LOW (ref 12.0–15.0)
Immature Granulocytes: 0 %
Lymphocytes Relative: 22 %
Lymphs Abs: 1.9 10*3/uL (ref 0.7–4.0)
MCH: 25.4 pg — ABNORMAL LOW (ref 26.0–34.0)
MCHC: 29.4 g/dL — ABNORMAL LOW (ref 30.0–36.0)
MCV: 86.4 fL (ref 80.0–100.0)
Monocytes Absolute: 0.7 10*3/uL (ref 0.1–1.0)
Monocytes Relative: 8 %
Neutro Abs: 5.9 10*3/uL (ref 1.7–7.7)
Neutrophils Relative %: 68 %
Platelets: 259 10*3/uL (ref 150–400)
RBC: 4.05 MIL/uL (ref 3.87–5.11)
RDW: 16.9 % — ABNORMAL HIGH (ref 11.5–15.5)
WBC: 8.6 10*3/uL (ref 4.0–10.5)
nRBC: 0 % (ref 0.0–0.2)

## 2021-04-16 LAB — BASIC METABOLIC PANEL
Anion gap: 7 (ref 5–15)
BUN: 18 mg/dL (ref 8–23)
CO2: 27 mmol/L (ref 22–32)
Calcium: 8.8 mg/dL — ABNORMAL LOW (ref 8.9–10.3)
Chloride: 104 mmol/L (ref 98–111)
Creatinine, Ser: 0.77 mg/dL (ref 0.44–1.00)
GFR, Estimated: >60 mL/min (ref >60)
Glucose, Bld: 145 mg/dL — ABNORMAL HIGH (ref 70–99)
Potassium: 4 mmol/L (ref 3.5–5.1)
Sodium: 138 mmol/L (ref 135–145)

## 2021-04-16 LAB — RESP PANEL BY RT-PCR (FLU A&B, COVID) ARPGX2
Influenza A by PCR: NEGATIVE
Influenza B by PCR: NEGATIVE
SARS Coronavirus 2 by RT PCR: NEGATIVE

## 2021-04-16 LAB — BRAIN NATRIURETIC PEPTIDE: B Natriuretic Peptide: 101.4 pg/mL — ABNORMAL HIGH (ref 0.0–100.0)

## 2021-04-16 LAB — POC OCCULT BLOOD, ED: Fecal Occult Bld: NEGATIVE

## 2021-04-16 LAB — D-DIMER, QUANTITATIVE: D-Dimer, Quant: 0.56 ug/mL-FEU — ABNORMAL HIGH (ref 0.00–0.50)

## 2021-04-16 MED ORDER — HYDROCODONE-ACETAMINOPHEN 5-325 MG PO TABS
1.0000 | ORAL_TABLET | Freq: Once | ORAL | Status: AC
Start: 2021-04-16 — End: 2021-04-16
  Administered 2021-04-16: 1 via ORAL
  Filled 2021-04-16: qty 1

## 2021-04-16 MED ORDER — ETODOLAC 300 MG PO CAPS: 300.0000 mg | ORAL_CAPSULE | Freq: Three times a day (TID) | ORAL | 0 refills | Status: DC

## 2021-04-16 NOTE — Progress Notes (Signed)
Lower extremity venous has been completed.   Preliminary results in CV Proc.   Aundra Millet Blakeley Scheier 04/16/2021 1:23 PM

## 2021-04-16 NOTE — ED Provider Triage Note (Signed)
Emergency Medicine Provider Triage Evaluation Note  Heidi Morse , a 66 y.o. female  was evaluated in triage.  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  2 weeks ago she noticed swelling and tenderness developing along the medial left side of her leg where the saphenous vein was removed.  She states that the pain is significant.  She is also complaining of associated myoclonus that developed at the same time.  She is feeling more short of breath than normal.  She denies chest pain.  Review of Systems  Positive: Medial leg swelling and shortness of breath Negative: Fever  Physical Exam  LMP 04/01/2000 (Approximate)  Gen:   Awake, no distress   Resp:  Normal effort Mild bronchitic wheeze MSK:   Moves extremities without difficulty  Other:  Swelling and edema along the left saphenous vein region.    Medical Decision Making  Medically screening exam initiated at 11:30 AM.  Appropriate orders placed.  Clairene Depaoli was informed that the remainder of the evaluation will be completed by another provider, this initial triage assessment does not replace that evaluation, and the importance of remaining in the ED until their evaluation is complete. Work-up initiated   Margarita Mail, PA-C 04/16/21 1132

## 2021-04-16 NOTE — ED Provider Notes (Signed)
[facility] EMERGENCY DEPARTMENT Provider Note   CSN: [id number] Arrival date & time: 04/16/21  1029     History  Chief Complaint  Patient presents with   Knee Pain    Heidi Morse is a 66 y.o. female.   Knee Pain  Patient has history of hypertension diabetes COPD CHF MI and anemia.  Patient states she has been having pain in her right knee area and she is also feeling short of breath.  Patient states her symptoms are worse with activity.  She is getting very winded trying to walk across the room.  She has not had any trouble with any chest pain.  No fevers.  Patient also started having pain in her right knee.  Its primarily in the medial aspect of her knee.  She denies any falls or injuries.  Patient has not noticed any redness or swelling.  Patient thinks issues because her blood count is low.  She has had issues with this in the past and has required iron transfusion.  She denies any history of PE or DVT.  Patient states she had an endoscopy at [facility] previously and told it was related to some bleeding from her small bowel.  Patient states she continues to notice darker stools ever since then but not significantly changed.  Home Medications Prior to Admission medications   Medication Sig Start Date End Date Taking? Authorizing Provider  albuterol (VENTOLIN HFA) 108 (90 Base) MCG/ACT inhaler Inhale 2 puffs into the lungs every 6 (six) hours as needed for wheezing. 03/09/20  Yes [provider]  amoxicillin (AMOXIL) 500 MG capsule Take 4 capsules (2,000 mg total) by mouth as needed (before dental work or other procedures). 09/14/20 09/14/21 Yes Barrett, Evelene Croon, PA-C  atorvastatin (LIPITOR) 80 MG tablet Take 40 mg by mouth at bedtime. 03/09/20  Yes [provider]  Capsaicin-Menthol (SALONPAS GEL-PATCH HOT EX) Apply 1 patch topically 2 (two) times daily. Knee pain   Yes [provider]  clopidogrel (PLAVIX) 75 MG tablet Take 1 tablet (75 mg  total) by mouth daily with breakfast. 12/29/20  Yes Minus Breeding, MD  cyclobenzaprine (FLEXERIL) 10 MG tablet Take 10 mg by mouth 2 (two) times daily as needed for muscle spasms. 03/24/21  Yes [provider]  diclofenac Sodium (VOLTAREN) 1 % GEL Apply 1 g topically 2 (two) times daily as needed (joint pain).   Yes [provider]  etodolac (LODINE) 300 MG capsule Take 1 capsule (300 mg total) by mouth every 8 (eight) hours. 04/16/21  Yes Dorie Rank, MD  Fluticasone-Salmeterol (ADVAIR) 250-50 MCG/DOSE AEPB Inhale 1 puff into the lungs in the morning and at bedtime. 01/27/20  Yes [provider]  gabapentin (NEURONTIN) 300 MG capsule Take 300 mg by mouth See admin instructions. Take one capsule (300 mg) by mouth twice daily - may also take 2 more times during the day as needed for pain 02/01/20  Yes [provider]  HYDROcodone-acetaminophen (NORCO) 10-325 MG tablet Take 1 tablet by mouth every 4 (four) hours as needed (pain).   Yes [provider]  insulin glargine (LANTUS) 100 UNIT/ML Solostar Pen Inject 30 Units into the skin at bedtime.   Yes [provider]  insulin lispro (HUMALOG KWIKPEN) 100 UNIT/ML KwikPen Inject 50 Units into the skin daily.   Yes [provider]  magnesium oxide (MAG-OX) 400 (240 Mg) MG tablet Take 400 mg by mouth daily. 03/08/21  Yes [provider]  metoprolol tartrate (LOPRESSOR)  25 MG tablet TAKE 1 AND 1/2 TABLETS BY MOUTH TWICE DAILY Patient taking differently: Take 75 mg by mouth 2 (two) times daily. 01/18/21  Yes Minus Breeding, MD  montelukast (SINGULAIR) 10 MG tablet Take 10 mg by mouth every morning. 01/05/20  Yes [provider]  nitroGLYCERIN (NITROSTAT) 0.4 MG SL tablet PLACE 1 TABLET UNDER THE TONGUE EVERY 5 MINUTES AS NEEDED FOR CHEST PAIN. Patient taking differently: Place 0.4 mg under the tongue every 5 (five) minutes as needed for chest pain. 11/27/20  Yes Minus Breeding, MD   pantoprazole (PROTONIX) 40 MG tablet Take 1 tablet (40 mg total) by mouth daily. 11/27/20  Yes Minus Breeding, MD  cholecalciferol (VITAMIN D3) 25 MCG (1000 UNIT) tablet Take 1,000 Units by mouth 2 (two) times daily.    [provider]  empagliflozin (JARDIANCE) 10 MG TABS tablet Take 1 tablet (10 mg total) by mouth daily before breakfast. 09/14/20   Barrett, Evelene Croon, PA-C  ferrous sulfate 325 (65 FE) MG tablet Take 325 mg by mouth 2 (two) times daily. 03/24/20   [provider]  OXYGEN Inhale into the lungs as needed (shortness of breath).    [provider]  valACYclovir (VALTREX) 500 MG tablet Take 500 mg by mouth 2 (two) times daily as needed (for flare ups).    [provider]      Allergies    Peanut oil, Povidone-iodine, Shellfish allergy, Statins, and Iodine    Review of Systems   Review of Systems  Neurological:  Positive for tremors.       Patient has had intermittent twitching in her upper extremities.  She has dropped objects as a result of this.  This has been ongoing for months   Physical Exam Updated Vital Signs BP 109/84    Pulse (!) 102    Temp 98.8 F (37.1 C) (Oral)    Resp 18    Ht 1.524 m (5')    Wt 113.4 kg    LMP 04/01/2000 (Approximate)    SpO2 98%    BMI 48.82 kg/m  Physical Exam Vitals and nursing note reviewed.  Constitutional:      Appearance: She is well-developed. She is not diaphoretic.     Comments: Elevated BMI  HENT:     Head: Normocephalic and atraumatic.     Right Ear: External ear normal.     Left Ear: External ear normal.  Eyes:     General: No scleral icterus.       Right eye: No discharge.        Left eye: No discharge.     Conjunctiva/sclera: Conjunctivae normal.  Neck:     Trachea: No tracheal deviation.  Cardiovascular:     Rate and Rhythm: Normal rate and regular rhythm.  Pulmonary:     Effort: Pulmonary effort is normal. No respiratory distress.     Breath sounds: Normal breath sounds. No  stridor. No wheezing or rales.  Abdominal:     General: Bowel sounds are normal. There is no distension.     Palpations: Abdomen is soft.     Tenderness: There is no abdominal tenderness. There is no guarding or rebound.  Musculoskeletal:        General: Tenderness present. No deformity.     Cervical back: Neck supple.     Comments: Tenderness palpation medial aspect right knee, no erythema  Skin:    General: Skin is warm and dry.     Findings: No rash.  Neurological:  General: No focal deficit present.     Mental Status: She is alert.     Cranial Nerves: No cranial nerve deficit (no facial droop, extraocular movements intact, no slurred speech).     Sensory: No sensory deficit.     Motor: No abnormal muscle tone or seizure activity.     Coordination: Coordination normal.  Psychiatric:        Mood and Affect: Mood normal.    ED Results / Procedures / Treatments   Labs (all labs ordered are listed, but only abnormal results are displayed) Labs Reviewed  CBC WITH DIFFERENTIAL/PLATELET - Abnormal; Notable for the following components:      Result Value   Hemoglobin 10.3 (*)    HCT 35.0 (*)    MCH 25.4 (*)    MCHC 29.4 (*)    RDW 16.9 (*)    All other components within normal limits  BRAIN NATRIURETIC PEPTIDE - Abnormal; Notable for the following components:   B Natriuretic Peptide 101.4 (*)    All other components within normal limits  BASIC METABOLIC PANEL - Abnormal; Notable for the following components:   Glucose, Bld 145 (*)    Calcium 8.8 (*)    All other components within normal limits  D-DIMER, QUANTITATIVE - Abnormal; Notable for the following components:   D-Dimer, Quant 0.56 (*)    All other components within normal limits  RESP PANEL BY RT-PCR (FLU A&B, COVID) [id number]  POC OCCULT BLOOD, ED    EKG None  Radiology DG Chest 2 View  Result Date: 04/16/2021 CLINICAL DATA:  Shortness of breath EXAM: CHEST - 2 VIEW COMPARISON:  01/28/2021 FINDINGS: Status  post median sternotomy and aortic valve replacement. Low lung volumes. Unchanged enlarged cardiac silhouette. Unchanged mediastinal contours. Aortic atherosclerosis. No focal pulmonary opacity. No pleural effusion or pneumothorax. No acute osseous abnormality. IMPRESSION: No active cardiopulmonary disease. Electronically Signed   By: Merilyn Baba M.D.   On: 04/16/2021 11:50   DG Knee Complete 4 Views Right  Result Date: 04/16/2021 CLINICAL DATA:  Patient with right knee pain for multiple days. EXAM: RIGHT KNEE - COMPLETE 4+ VIEW COMPARISON:  None. FINDINGS: Normal anatomic alignment. No evidence for acute fracture or dislocation. Tricompartmental degenerative changes most pronounced within the medial compartment. Postsurgical changes within the soft tissues. Vascular calcifications. IMPRESSION: Degenerative changes.  No acute osseous abnormality. Electronically Signed   By: Lovey Newcomer M.D.   On: 04/16/2021 15:57   VAS [place] LOWER EXTREMITY VENOUS (DVT) ([facility] [place] 7a-7p)  Result Date: 04/16/2021  Lower Venous DVT Study Patient Name:  CALE MORAITIS  Date of Exam:   04/16/2021 Medical Rec #: [id number]       Accession #:    [id number] Date of Birth: 1956-02-09       Patient Gender: F Patient Age:   56 years Exam Location:  [facility] Procedure:      VAS [place] LOWER EXTREMITY VENOUS (DVT) Referring Phys: ABIGAIL HARRIS --------------------------------------------------------------------------------  Indications: Pain, and Swelling.  Comparison Study: 08/01/20 prior Performing Technologist: Archie Patten RVS  Examination Guidelines: A complete evaluation includes B-mode imaging, spectral Doppler, color Doppler, and power Doppler as needed of all accessible portions of each vessel. Bilateral testing is considered an integral part of a complete examination. Limited examinations for reoccurring indications may be performed as noted. The reflux portion of the exam is performed with the patient in reverse  Trendelenburg.  +---------+---------------+---------+-----------+----------+---------------+  RIGHT     Compressibility Phasicity Spontaneity Properties Thrombus Aging   +---------+---------------+---------+-----------+----------+---------------+  CFV       Full            Yes       Yes                                     +---------+---------------+---------+-----------+----------+---------------+  SFJ       Full                                                              +---------+---------------+---------+-----------+----------+---------------+  FV Prox   Full                                                              +---------+---------------+---------+-----------+----------+---------------+  FV Mid    Full            Yes       Yes                                     +---------+---------------+---------+-----------+----------+---------------+  FV Distal                 Yes       Yes                                     +---------+---------------+---------+-----------+----------+---------------+  PFV       Full                                                              +---------+---------------+---------+-----------+----------+---------------+  POP       Full            Yes       Yes                                     +---------+---------------+---------+-----------+----------+---------------+  PTV       Full                                                              +---------+---------------+---------+-----------+----------+---------------+  PERO                                                       patent by color  +---------+---------------+---------+-----------+----------+---------------+   +----+---------------+---------+-----------+----------+--------------+  LEFT Compressibility Phasicity Spontaneity Properties Thrombus Aging  +----+---------------+---------+-----------+----------+--------------+  CFV  Full            Yes       Yes                                     +----+---------------+---------+-----------+----------+--------------+     Summary: RIGHT: - No evidence of deep vein thrombosis in the lower extremity. No indirect evidence of obstruction proximal to the inguinal ligament. - No cystic structure found in the popliteal fossa.  LEFT: - No evidence of common femoral vein obstruction.  *See table(s) above for measurements and observations. Electronically signed by Servando Snare MD on 04/16/2021 at 6:00:07 PM.    Final     Procedures Procedures    Medications Ordered in ED Medications  HYDROcodone-acetaminophen (NORCO/VICODIN) 5-325 MG per tablet 1 tablet (has no administration in time range)    ED Course/ Medical Decision Making/ A&P Clinical Course as of 04/16/21 1949  Mon Apr 16, 2021  1520 Hemoglobin decreased at 10.3, [place]hman was 11.5 in October 31 [JK]  1521 Chest x-ray without acute findings [JK]  1521 Doppler study without DVT [JK]  1521 Covid and flu negative.  BNP normal [JK]  1841 Occult negative. [JK]  1928 D-dimer slightly increased at 0.56 but consistent with age-adjusted range [JK]  1928 X-rays of the knee without acute finding [JK]  1946 PDMP reviewed.  Patient does get monthly hydrocodone prescriptions [JK]    Clinical Course User Index [JK] Dorie Rank, MD                           Medical Decision Making  Patient presents to the ED for evaluation of shortness of breath and knee pain.  Concern for the possibility of PE and DVT.  Fortunately D-dimer is negative and the patient's Doppler study does not show any evidence of DVT.  Acute pulmonary embolism.  Patient is actually breathing easily here in the ED.  She is not tachypneic and does not have an oxygen requirement.  Patient does have chronic anemia and her hemoglobin is trending downward.  However her guaiac is negative and there is no evidence of acute GI bleeding.  Is possible her hemoglobin of 10 could be contributing to some of her dyspnea on exertion but there is no  indication for admission or blood transfusion at this time.  No signs of infection or acute bony changes on her knee x-ray.  Recommend taking pain medication symptomatically.  Patient does have hydrocodone.  Will recommend outpatient follow-up with orthopedics.  Follow-up with your primary doctor regarding your anemia        Final Clinical Impression(s) / ED Diagnoses Final diagnoses:  Acute pain of left knee    Rx / DC Orders ED Discharge Orders          Ordered    etodolac (LODINE) 300 MG capsule  Every 8 hours       Note to Pharmacy: As needed for pain   04/16/21 1945              Dorie Rank, MD 04/16/21 1949

## 2021-04-16 NOTE — ED Notes (Signed)
Patient verbalizes understanding of discharge instructions. Opportunity for questioning and answers were provided. Armband removed by staff, pt discharged from ED. Wheeled out to lobby  

## 2021-04-16 NOTE — ED Triage Notes (Signed)
Pt arrived POV c/o SHOB and right knee pain for several days. Pt states she had a vein taken out not too long ago.

## 2021-04-16 NOTE — Discharge Instructions (Signed)
Take the medications as needed for pain.  Follow-up with your doctor for further ration.  Follow-up with your primary care doctor to check on your anemia.

## 2021-05-31 ENCOUNTER — Ambulatory Visit: Payer: Medicare Other | Admitting: Cardiology

## 2021-05-31 DIAGNOSIS — I10 Essential (primary) hypertension: Secondary | ICD-10-CM | POA: Insufficient documentation

## 2021-05-31 DIAGNOSIS — E785 Hyperlipidemia, unspecified: Secondary | ICD-10-CM | POA: Insufficient documentation

## 2021-05-31 DIAGNOSIS — R0602 Shortness of breath: Secondary | ICD-10-CM | POA: Insufficient documentation

## 2021-05-31 NOTE — Progress Notes (Signed)
Cardiology Office Note   Date:  06/01/2021   ID:  Heidi Morse, DOB 24-Jan-1956, MRN [id number]  PCP:  Greer Ee., FNP  Cardiologist:   Rollene Rotunda, MD  Chief Complaint  Patient presents with   Coronary Artery Disease       History of Present Illness: Heidi Morse is a 66 y.o. female who presents for evaluation CAD s/p CABG, aortic stenosis s/p AVR, postop atrial fibrillation, ischemic cardiomyopathy, PAD s/p left common femoral artery stenting 11/2017, bilateral carotid artery disease s/p stenting of left ICA, COPD, HTN, HLD, DM 2, and obstructive sleep apnea.  The patient has had multiple stenting in the past.  She had chest pain and had an endoscopy in December 2021 that showed bleeding AVMs.  She was subsequently admitted at [facility] for anemia and GI Bleed.  She was seen at [facility] ED on 07/27/2019 due to persistent chest pain unrelieved by nitroglycerin.  Work-up was unremarkable.  She was felt to have noncardiac type chest discomfort.  She presented to [facility] near the end of December for evaluation of chest pain again.  Troponin was negative.  Hemoglobin was 7.6 and the she was transfused with 1 pack of red blood cell.  GI service was contacted who did not recommend any intervention.  She subsequently underwent left heart cath that showed patent left main stent with distal moderate restenosis, haziness noted via ostial LAD suggesting moderate restenosis of the stent, severe restenosis of the ostial left circumflex stent.  Echocardiogram showed EF 45 to 50%, mild LVH, grade 2 DD, mild to moderate AI, moderate aortic stenosis and moderate MR. She was seen by CT surgery and eventually underwent CABG x2 with LIMA to LAD and SVG to OM1 as well as AVR on 04/03/2020 by Dr. Cliffton Asters.  Postop course was complicated by atrial fibrillation with RVR.  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 She was given digoxin and beta-blocker.  She subsequently  converted back to sinus rhythm therefore was not started on anticoagulation therapy. She had increased SOB.  She had cath in 2022 and had high grade stenosis of an SVG for which she had angioplasty and had improvement in her symptoms.  She has been followed at [facility] for GI bleeding. She underwent endoscopy 12/22/2020 at [facility] which revealed 3 bleeding areas in her duodenum which were treated with APC, a single bleeding area in her jejunum also treated with APC.  She follows with hematology oncology Dr. Allison Morse at [facility].  She had been placed on oral iron therapy.     From a cardiac standpoint she is done relatively well.  She is mostly bothered by right knee pain and has had injections.  She is having problems with her back and has pain management.  She is getting around with a rollator here today in a wheelchair.  From a cardiac standpoint she thinks her breathing is better.  She is not having any new shortness of breath, PND or orthopnea.  She denied having any new palpitations, presyncope or syncope.  She has no chest pressure, neck or arm discomfort.  She had no new edema.   Past Medical History:  Diagnosis Date   Asthma    Bilateral carotid artery stenosis    CAD (coronary artery disease)    s/p CABG x2 in 04/2020   CHF (congestive heart failure) (HCC)    COPD (chronic obstructive pulmonary disease) (HCC)    Diabetes mellitus without  complication (HCC)    Hypertension    MI (myocardial infarction) (HCC)    PAD (peripheral artery disease) (HCC)    Sleep apnea     Past Surgical History:  Procedure Laterality Date   AORTIC ROOT ENLARGEMENT N/A 04/03/2020   Procedure: AORTIC ROOT ENLARGEMENT USING HEMASHIELD PLATINUM WOVEN DOUBLE VELOUR VASCULAR GRAFT 28 MM X 30 CM;  Surgeon: Corliss Skains, MD;  Location: [facility] OR;  Service: Open Heart Surgery;  Laterality: N/A;   AORTIC VALVE REPLACEMENT N/A 04/03/2020   Procedure: CORONARY ARTERY BYPASS GRAFTING X 2 ON CARDIOPULMONARY BYPASS. AORTIC VALVE  REPLACEMENT USING 21 MM INSPIRIS RESILIA AORTIC VALVE, STERNAL PLATING;  Surgeon: Corliss Skains, MD;  Location: [facility] OR;  Service: Open Heart Surgery;  Laterality: N/A;  Coronary artery bypass grafting  Flow Trac   CORONARY STENT INTERVENTION N/A 09/12/2020   Procedure: CORONARY STENT INTERVENTION;  Surgeon: Corky Crafts, MD;  Location: [facility] INVASIVE CV LAB;  Service: Cardiovascular;  Laterality: N/A;   ENDOVEIN HARVEST OF GREATER SAPHENOUS VEIN Right 04/03/2020   Procedure: ENDOVEIN HARVEST OF GREATER SAPHENOUS VEIN;  Surgeon: Corliss Skains, MD;  Location: [facility] OR;  Service: Open Heart Surgery;  Laterality: Right;   LEFT HEART CATH AND CORONARY ANGIOGRAPHY N/A 03/30/2020   Procedure: LEFT HEART CATH AND CORONARY ANGIOGRAPHY;  Surgeon: Kathleene Hazel, MD;  Location: [facility] INVASIVE CV LAB;  Service: Cardiovascular;  Laterality: N/A;   RIGHT/LEFT HEART CATH AND CORONARY/GRAFT ANGIOGRAPHY N/A 09/12/2020   Procedure: RIGHT/LEFT HEART CATH AND CORONARY/GRAFT ANGIOGRAPHY;  Surgeon: Corky Crafts, MD;  Location: [facility] INVASIVE CV LAB;  Service: Cardiovascular;  Laterality: N/A;   TEE WITHOUT CARDIOVERSION N/A 04/03/2020   Procedure: TRANSESOPHAGEAL ECHOCARDIOGRAM (TEE);  Surgeon: Corliss Skains, MD;  Location: [facility] OR;  Service: Open Heart Surgery;  Laterality: N/A;     Current Outpatient Medications  Medication Sig Dispense Refill   albuterol (VENTOLIN HFA) 108 (90 Base) MCG/ACT inhaler Inhale 2 puffs into the lungs every 6 (six) hours as needed for wheezing.     amoxicillin (AMOXIL) 500 MG capsule Take 4 capsules (2,000 mg total) by mouth as needed (before dental work or other procedures). 16 capsule 3   Capsaicin-Menthol (SALONPAS GEL-PATCH HOT EX) Apply 1 patch topically 2 (two) times daily. Knee pain     cholecalciferol (VITAMIN D3) 25 MCG (1000 UNIT) tablet Take 1,000 Units by mouth 2 (two) times daily.     cyclobenzaprine (FLEXERIL) 10 MG tablet Take 10 mg by mouth 2  (two) times daily as needed for muscle spasms.     ferrous sulfate 325 (65 FE) MG tablet Take 325 mg by mouth 2 (two) times daily.     Fluticasone-Salmeterol (ADVAIR) 250-50 MCG/DOSE AEPB Inhale 1 puff into the lungs in the morning and at bedtime.     gabapentin (NEURONTIN) 300 MG capsule Take 300 mg by mouth See admin instructions. Take one capsule (300 mg) by mouth twice daily - may also take 2 more times during the day as needed for pain     HYDROcodone-acetaminophen (NORCO) 10-325 MG tablet Take 1 tablet by mouth every 4 (four) hours as needed (pain).     insulin glargine (LANTUS) 100 UNIT/ML Solostar Pen Inject 30 Units into the skin at bedtime.     insulin lispro (HUMALOG) 100 UNIT/ML KwikPen Inject 50 Units into the skin daily.     magnesium oxide (MAG-OX) 400 (240 Mg) MG tablet Take 400 mg by mouth daily.     metoprolol tartrate (  LOPRESSOR) 25 MG tablet TAKE 1 AND 1/2 TABLETS BY MOUTH TWICE DAILY (Patient taking differently: Take 75 mg by mouth 2 (two) times daily.) 270 tablet 1   montelukast (SINGULAIR) 10 MG tablet Take 10 mg by mouth every morning.     nitroGLYCERIN (NITROSTAT) 0.4 MG SL tablet PLACE 1 TABLET UNDER THE TONGUE EVERY 5 MINUTES AS NEEDED FOR CHEST PAIN. (Patient taking differently: Place 0.4 mg under the tongue every 5 (five) minutes as needed for chest pain.) 25 tablet 3   OXYGEN Inhale into the lungs as needed (shortness of breath).     rosuvastatin (CRESTOR) 40 MG tablet Take 1 tablet (40 mg total) by mouth daily. 90 tablet 3   valACYclovir (VALTREX) 500 MG tablet Take 500 mg by mouth 2 (two) times daily as needed (for flare ups).     clopidogrel (PLAVIX) 75 MG tablet Take 1 tablet (75 mg total) by mouth daily with breakfast. 30 tablet 6   diclofenac Sodium (VOLTAREN) 1 % GEL Apply 1 g topically 2 (two) times daily as needed (joint pain). (Patient not taking: Reported on 06/01/2021)     empagliflozin (JARDIANCE) 10 MG TABS tablet Take 1 tablet (10 mg total) by mouth daily  before breakfast. (Patient not taking: Reported on 06/01/2021) 30 tablet 11   etodolac (LODINE) 300 MG capsule Take 1 capsule (300 mg total) by mouth every 8 (eight) hours. (Patient not taking: Reported on 06/01/2021) 21 capsule 0   pantoprazole (PROTONIX) 40 MG tablet Take 1 tablet (40 mg total) by mouth daily. 30 tablet 11   No current facility-administered medications for this visit.    Allergies:   Peanut oil, Povidone-iodine, Shellfish allergy, Statins, and Iodine    ROS:  Please see the history of present illness.   Otherwise, review of systems are positive for none.   All other systems are reviewed and negative.    PHYSICAL EXAM: VS:  BP 120/84    Pulse 99    Ht 5' (1.524 m)    Wt 251 lb (113.9 kg)    LMP 04/01/2000 (Approximate)    SpO2 98%    BMI 49.02 kg/m  , BMI Body mass index is 49.02 kg/m. GEN:  No distress NECK:  No jugular venous distention at 90 degrees, waveform within normal limits, carotid upstroke brisk and symmetric, no bruits, no thyromegaly LYMPHATICS:  No cervical adenopathy LUNGS:  Clear to auscultation bilaterally BACK:  No CVA tenderness CHEST:  Unremarkable HEART:  S1 and S2 within normal limits, no S3, no S4, no clicks, no rubs, no murmurs ABD:  Positive bowel sounds normal in frequency in pitch, no bruits, no rebound, no guarding, unable to assess midline mass or bruit with the patient seated. EXT:  2 plus pulses throughout, moderate edema, no cyanosis no clubbing SKIN:  No rashes no nodules NEURO:  Cranial nerves II through XII grossly intact, motor grossly intact throughout PSYCH:  Cognitively intact, oriented to person place and time    EKG:  EKG is not ordered today. NA   Recent Labs: 01/25/2021: TSH 2.520 01/28/2021: ALT 18 04/16/2021: B Natriuretic Peptide 101.4; BUN 18; Creatinine, Ser 0.77; Hemoglobin 10.3; Platelets 259; Potassium 4.0; Sodium 138    Lipid Panel    Component Value Date/Time   CHOL 155 09/13/2020 0815   TRIG 60 09/13/2020  0815   HDL 43 09/13/2020 0815   CHOLHDL 3.6 09/13/2020 0815   VLDL 12 09/13/2020 0815   LDLCALC 100 (H) 09/13/2020 0815  Wt Readings from Last 3 Encounters:  06/01/21 251 lb (113.9 kg)  04/16/21 250 lb (113.4 kg)  03/01/21 239 lb (108.4 kg)    Diagnostic cadiac cath 08/2020 Dominance: Right    Intervention       Other studies Reviewed: Additional studies/ records that were reviewed today include: Labs  Review of the above records demonstrates:  Please see elsewhere in the note.    ASSESSMENT AND PLAN:  Symptomatic Anemia She has systolic and she gets her iron.  She says her hemoglobin is stable.  CAD She has no new symptoms since her PCI.  No change in therapy.  Chronic diastolic heart failure She seems to be euvolemic.  No change in therapy.    HLD Total cholesterol was 219 with an LDL of 148.  She is not at target.  I am going to switch her to Crestor.  She has been intolerant to Zocor but she is taking 40 mg of atorvastatin.  She did not tolerate 80 mg.  She is willing to try the Crestor and if this does not work she will need PCSK9.   HTN Blood pressure is at target.  No change in therapy.  Dsypnea She rarely uses home O2 and says her shortness of breath is better.  AVR This was stable in May 2022.  I will check an echo prior to her visit.   Obesity Hopefully she will get her knees better or some improvement in her pain and be able to do some more activity.  We talked about her weight previously.   Current medicines are reviewed at length with the patient today.  The patient does not have concerns regarding medicines.  The following changes have been made: As above  Labs/ tests ordered today include:    Orders Placed This Encounter  Procedures   ECHOCARDIOGRAM COMPLETE      Disposition:   FU with me in 1 year   Signed, Rollene Rotunda, MD  06/01/2021 10:38 AM    [facility]

## 2021-06-01 ENCOUNTER — Encounter: Payer: Self-pay | Admitting: Cardiology

## 2021-06-01 ENCOUNTER — Ambulatory Visit (INDEPENDENT_AMBULATORY_CARE_PROVIDER_SITE_OTHER): Payer: Medicare Other | Admitting: Cardiology

## 2021-06-01 ENCOUNTER — Other Ambulatory Visit: Payer: Self-pay

## 2021-06-01 VITALS — BP 120/84 | HR 99 | Ht 60.0 in | Wt 251.0 lb

## 2021-06-01 DIAGNOSIS — E785 Hyperlipidemia, unspecified: Secondary | ICD-10-CM

## 2021-06-01 DIAGNOSIS — I25810 Atherosclerosis of coronary artery bypass graft(s) without angina pectoris: Secondary | ICD-10-CM

## 2021-06-01 DIAGNOSIS — I5032 Chronic diastolic (congestive) heart failure: Secondary | ICD-10-CM | POA: Diagnosis not present

## 2021-06-01 DIAGNOSIS — R0602 Shortness of breath: Secondary | ICD-10-CM

## 2021-06-01 DIAGNOSIS — I10 Essential (primary) hypertension: Secondary | ICD-10-CM

## 2021-06-01 DIAGNOSIS — Z952 Presence of prosthetic heart valve: Secondary | ICD-10-CM

## 2021-06-01 MED ORDER — PANTOPRAZOLE SODIUM 40 MG PO TBEC: 40.0000 mg | DELAYED_RELEASE_TABLET | Freq: Every day | ORAL | 11 refills | Status: AC

## 2021-06-01 MED ORDER — CLOPIDOGREL BISULFATE 75 MG PO TABS: 75.0000 mg | ORAL_TABLET | Freq: Every day | ORAL | 6 refills | Status: DC

## 2021-06-01 MED ORDER — ROSUVASTATIN CALCIUM 40 MG PO TABS: 40.0000 mg | ORAL_TABLET | Freq: Every day | ORAL | 3 refills | Status: DC

## 2021-06-01 NOTE — Patient Instructions (Addendum)
Medication Instructions:  ?Your physician has recommended you make the following change in your medication:  ?STOP: Lipitor ?START: Crestor 40 mg once daily ?*If you need a refill on your cardiac medications before your next appointment, please call your pharmacy* ? ? ?Lab Work: ?None ?If you have labs (blood work) drawn today and your tests are completely normal, you will receive your results only by: ?MyChart Message (if you have MyChart) OR ?A paper copy in the mail ?If you have any lab test that is abnormal or we need to change your treatment, we will call you to review the results. ? ? ?Testing/Procedures: ?Your physician has requested that you have an echocardiogram. Echocardiography is a painless test that uses sound waves to create images of your heart. It provides your doctor with information about the size and shape of your heart and how well your heart?s chambers and valves are working. This procedure takes approximately one hour. There are no restrictions for this procedure. ? ? ?Follow-Up: ?At [facility], you and your health needs are our priority.  As part of our continuing mission to provide you with exceptional heart care, we have created designated Provider Care Teams.  These Care Teams include your primary Cardiologist (physician) and Advanced Practice Providers (APPs -  Physician Assistants and Nurse Practitioners) who all work together to provide you with the care you need, when you need it. ? ?We recommend signing up for the patient portal called "MyChart".  Sign up information is provided on this After Visit Summary.  MyChart is used to connect with patients for Virtual Visits (Telemedicine).  Patients are able to view lab/test results, encounter notes, upcoming appointments, etc.  Non-urgent messages can be sent to your provider as well.   ?To learn more about what you can do with MyChart, go to [email or web address].   ? ?Your next appointment:   ?1 year(s) ? ?The format for your next  appointment:   ?In Person ? ?Provider:   ?Minus Breeding, MD   ? ? ?Other Instructions ?  ?

## 2021-06-03 ENCOUNTER — Emergency Department (HOSPITAL_BASED_OUTPATIENT_CLINIC_OR_DEPARTMENT_OTHER)
Admission: EM | Admit: 2021-06-03 | Discharge: 2021-06-03 | Disposition: A | Discharge status: Home / Self Care | Payer: Medicare Other | Attending: Emergency Medicine | Admitting: Emergency Medicine

## 2021-06-03 ENCOUNTER — Encounter (HOSPITAL_BASED_OUTPATIENT_CLINIC_OR_DEPARTMENT_OTHER): Payer: Self-pay | Admitting: Emergency Medicine

## 2021-06-03 ENCOUNTER — Emergency Department (HOSPITAL_BASED_OUTPATIENT_CLINIC_OR_DEPARTMENT_OTHER): Payer: Medicare Other

## 2021-06-03 ENCOUNTER — Other Ambulatory Visit: Payer: Self-pay

## 2021-06-03 DIAGNOSIS — G8929 Other chronic pain: Secondary | ICD-10-CM | POA: Diagnosis not present

## 2021-06-03 DIAGNOSIS — M25561 Pain in right knee: Secondary | ICD-10-CM | POA: Insufficient documentation

## 2021-06-03 DIAGNOSIS — Z79899 Other long term (current) drug therapy: Secondary | ICD-10-CM | POA: Diagnosis not present

## 2021-06-03 DIAGNOSIS — R2241 Localized swelling, mass and lump, right lower limb: Secondary | ICD-10-CM | POA: Insufficient documentation

## 2021-06-03 DIAGNOSIS — Z91010 Allergy to peanuts: Secondary | ICD-10-CM | POA: Diagnosis not present

## 2021-06-03 DIAGNOSIS — Z794 Long term (current) use of insulin: Secondary | ICD-10-CM | POA: Insufficient documentation

## 2021-06-03 MED ORDER — FENTANYL CITRATE PF 50 MCG/ML IJ SOSY
50.0000 ug | PREFILLED_SYRINGE | Freq: Once | INTRAMUSCULAR | Status: AC
  Administered 2021-06-03: 50 ug via INTRAMUSCULAR
  Filled 2021-06-03: qty 1

## 2021-06-03 MED ORDER — DICLOFENAC SODIUM 1 % EX GEL: 2.0000 g | Freq: Two times a day (BID) | CUTANEOUS | 0 refills | Status: DC | PRN

## 2021-06-03 NOTE — ED Notes (Signed)
ED Provider at bedside. 

## 2021-06-03 NOTE — ED Triage Notes (Signed)
Pt reports pain and swelling to right knee x 3 weeks. Pt also reports Right foot swelling noticed today. Pt seen by orthopedic 2 weeks ago where she received a cortisone injection. Pt went back to ortho on Friday and was told to see her pain doctor. Pt has appointment with pain doctor and vascular doctor on Wendesday. ?

## 2021-06-03 NOTE — ED Provider Notes (Signed)
I provided a substantive portion of the care of this patient.  I personally performed the entirety of the history, exam, and medical decision making for this encounter. ? ?  ? ?Patient with complaint of pain to the right knee for 3 weeks.  Seen by Dr. Wynonia Musty orthopedics in [place] had an injection 3 weeks ago had another injection done on Friday.  Sounds as if they are most likely steroids.  Sounds like they may have used lidocaine or Marcaine with the injection yesterday.  But the pain is back.  Patient is followed by pain management.  She has follow-up with them on Wednesday.  She gets pain medications every 30 days. ? ?Here today x-ray of the knee was done without any acute findings.  Doppler studies were done to rule out a deep vein thrombosis no evidence of that.  But there is a right knee joint effusion correlate clinically for hemorrhage or infection. ? ?Patient without any erythema to the knee no significant increased warmth.  I think this is an inflammatory change.  I do not think she has a septic joint.  Patient given precautions if any redness develops on the outside on the skin that she needs to get seen right away.  Otherwise recommend follow-up with orthopedics. ?  ?Fredia Sorrow, MD ?06/03/21 1655 ? ?

## 2021-06-03 NOTE — ED Provider Notes (Signed)
[facility] EMERGENCY DEPARTMENT Provider Note   CSN: [id number] Arrival date & time: 06/03/21  1259     History Chief Complaint  Patient presents with   Joint Swelling    Heidi Morse is a 66 y.o. female with h/o chronic bilateral knee pain presents to the ED for evaluation of acute on chronic knee pain over the past [redacted] weeks along with some swelling..  She reports when this pain initially started she was seen by her orthopedic provider, Dr. Sherilyn Dacosta.  He gave her a injection at that time.  She reports the pain did get better but then started to get worse.  She got an appointment with him sooner which was 2 days ago where he gave her another injection although she does not know if it was steroid or not for the pain.  She reports she had some relief for a few hours afterwards but her pain returned.  She reports she has been getting around with a rollator and wheelchair patient has pain applying any pressure to it.  She is still able to bend the knee, but ports it hurts to put any pressure on it with standing and walking.  Denies any fever, history of DVT or PE, chest pain, shortness of breath.  She denies any new falls or injury.  She reports that she made an appointment with her pain management doctor and vascular surgeon in the upcoming week.  HPI     Home Medications Prior to Admission medications   Medication Sig Start Date End Date Taking? Authorizing Provider  albuterol (VENTOLIN HFA) 108 (90 Base) MCG/ACT inhaler Inhale 2 puffs into the lungs every 6 (six) hours as needed for wheezing. 03/09/20   [provider]  amoxicillin (AMOXIL) 500 MG capsule Take 4 capsules (2,000 mg total) by mouth as needed (before dental work or other procedures). 09/14/20 09/14/21  Barrett, Joline Salt, PA-C  Capsaicin-Menthol (SALONPAS GEL-PATCH HOT EX) Apply 1 patch topically 2 (two) times daily. Knee pain    [provider]  cholecalciferol (VITAMIN D3) 25 MCG (1000 UNIT) tablet Take  1,000 Units by mouth 2 (two) times daily.    [provider]  clopidogrel (PLAVIX) 75 MG tablet Take 1 tablet (75 mg total) by mouth daily with breakfast. 06/01/21   Rollene Rotunda, MD  cyclobenzaprine (FLEXERIL) 10 MG tablet Take 10 mg by mouth 2 (two) times daily as needed for muscle spasms. 03/24/21   [provider]  diclofenac Sodium (VOLTAREN) 1 % GEL Apply 1 g topically 2 (two) times daily as needed (joint pain). Patient not taking: Reported on 06/01/2021    [provider]  empagliflozin (JARDIANCE) 10 MG TABS tablet Take 1 tablet (10 mg total) by mouth daily before breakfast. Patient not taking: Reported on 06/01/2021 09/14/20   Barrett, Joline Salt, PA-C  etodolac (LODINE) 300 MG capsule Take 1 capsule (300 mg total) by mouth every 8 (eight) hours. Patient not taking: Reported on 06/01/2021 04/16/21   Linwood Dibbles, MD  ferrous sulfate 325 (65 FE) MG tablet Take 325 mg by mouth 2 (two) times daily. 03/24/20   [provider]  Fluticasone-Salmeterol (ADVAIR) 250-50 MCG/DOSE AEPB Inhale 1 puff into the lungs in the morning and at bedtime. 01/27/20   [provider]  gabapentin (NEURONTIN) 300 MG capsule Take 300 mg by mouth See admin instructions. Take one capsule (300 mg) by mouth twice daily - may also take 2 more times during the day as needed for pain 02/01/20  [provider]  HYDROcodone-acetaminophen (NORCO) 10-325 MG tablet Take 1 tablet by mouth every 4 (four) hours as needed (pain).    [provider]  insulin glargine (LANTUS) 100 UNIT/ML Solostar Pen Inject 30 Units into the skin at bedtime.    [provider]  insulin lispro (HUMALOG) 100 UNIT/ML KwikPen Inject 50 Units into the skin daily.    [provider]  magnesium oxide (MAG-OX) 400 (240 Mg) MG tablet Take 400 mg by mouth daily. 03/08/21   [provider]  metoprolol tartrate (LOPRESSOR) 25 MG tablet TAKE 1 AND 1/2 TABLETS BY MOUTH TWICE  DAILY Patient taking differently: Take 75 mg by mouth 2 (two) times daily. 01/18/21   Rollene Rotunda, MD  montelukast (SINGULAIR) 10 MG tablet Take 10 mg by mouth every morning. 01/05/20   [provider]  nitroGLYCERIN (NITROSTAT) 0.4 MG SL tablet PLACE 1 TABLET UNDER THE TONGUE EVERY 5 MINUTES AS NEEDED FOR CHEST PAIN. Patient taking differently: Place 0.4 mg under the tongue every 5 (five) minutes as needed for chest pain. 11/27/20   Rollene Rotunda, MD  OXYGEN Inhale into the lungs as needed (shortness of breath).    [provider]  pantoprazole (PROTONIX) 40 MG tablet Take 1 tablet (40 mg total) by mouth daily. 06/01/21   Rollene Rotunda, MD  rosuvastatin (CRESTOR) 40 MG tablet Take 1 tablet (40 mg total) by mouth daily. 06/01/21 08/30/21  Rollene Rotunda, MD  valACYclovir (VALTREX) 500 MG tablet Take 500 mg by mouth 2 (two) times daily as needed (for flare ups).    [provider]      Allergies    Peanut oil, Povidone-iodine, Shellfish allergy, Statins, and Iodine    Review of Systems   Review of Systems  Constitutional:  Negative for chills and fever.  Respiratory:  Negative for shortness of breath.   Cardiovascular:  Negative for chest pain.  Musculoskeletal:  Positive for arthralgias and joint swelling.  Neurological:  Negative for weakness and numbness.   Physical Exam Updated Vital Signs BP 108/72 (BP Location: Right Arm)    Pulse 88    Temp 98.5 F (36.9 C) (Oral)    Resp 15    Ht 5' (1.524 m)    Wt 113.4 kg    LMP 04/01/2000 (Approximate)    SpO2 100%    BMI 48.82 kg/m  Physical Exam Vitals and nursing note reviewed.  Constitutional:      General: She is not in acute distress.    Appearance: Normal appearance. She is not toxic-appearing.  HENT:     Head: Normocephalic and atraumatic.  Eyes:     General: No scleral icterus. Cardiovascular:     Rate and Rhythm: Normal rate and regular rhythm.  Pulmonary:     Effort: Pulmonary effort is normal.      Breath sounds: Normal breath sounds.  Musculoskeletal:        General: Swelling and tenderness present. No deformity.     Cervical back: Normal range of motion.     Comments: The patient is able to bend and straighten the knee with pain.  The right knee and lower leg is slightly more swollen than the leg to the left.  There is no pitting edema.  There is no overlying skin changes other than to faint surgical scars to the medial aspect of the upper and lower leg.  There is no crepitus.  There is no increase in warmth.  No overlying erythema.  She has palpable  DP and PT pulses.  Compartments are soft.  Sensation is intact bilaterally. She has tenderness mainly to the medial aspect of the right knee. I do not palpate any deformity or step off.   Skin:    General: Skin is warm and dry.     Findings: No rash.  Neurological:     General: No focal deficit present.     Mental Status: She is alert. Mental status is at baseline.     Sensory: No sensory deficit.    ED Results / Procedures / Treatments   Labs (all labs ordered are listed, but only abnormal results are displayed) Labs Reviewed - No data to display  EKG None  Radiology US Venous Img Lower Unilateral Right  Result Date: 06/03/2021 CLINICAL DATA:  Pain. EXAM: Right LOWER EXTREMITY VENOUS DOPPLER ULTRASOUND TECHNIQUE: Gray-scale sonography with compression, as well as color and duplex ultrasound, were performed to evaluate the deep venous system(s) from the level of the common femoral vein through the popliteal and proximal calf veins. COMPARISON:  None. FINDINGS: VENOUS Normal compressibility of the common femoral, superficial femoral, and popliteal veins, as well as the visualized calf veins. Visualized portions of profunda femoral vein and great saphenous vein unremarkable. No filling defects to suggest DVT on grayscale or color Doppler imaging. Doppler waveforms show normal direction of venous flow, normal respiratory plasticity and  response to augmentation. Limited views of the contralateral common femoral vein are unremarkable. OTHER Right knee joint effusion is present containing internal debris. Limitations: none IMPRESSION: 1. No evidence for deep venous thrombosis in the right lower extremity. 2. Right knee joint effusion contains internal debris. Correlate clinically for hemorrhage or infection. Electronically Signed   By: Darliss CheneyAmy  Guttmann M.D.   On: 06/03/2021 16:19   DG Knee Complete 4 Views Right  Result Date: 06/03/2021 CLINICAL DATA:  pain EXAM: RIGHT KNEE - COMPLETE 4+ VIEW COMPARISON:  April 16, 2021 FINDINGS: No acute fracture or dislocation. Osteopenia. Tricompartmental degenerative changes with moderate degenerative changes of the medial compartment and mild degenerative changes of the patellofemoral and lateral compartments. No area of erosion or osseous destruction. No unexpected radiopaque foreign body. Surgical clips project over the inner knee. Vascular calcifications. No significant joint effusion. IMPRESSION: No acute fracture or dislocation. Electronically Signed   By: Meda KlinefelterStephanie  Peacock M.D.   On: 06/03/2021 15:53    Procedures Procedures    Medications Ordered in ED Medications  fentaNYL (SUBLIMAZE) injection 50 mcg (50 mcg Intramuscular Given 06/03/21 1623)    ED Course/ Medical Decision Making/ A&P                           Medical Decision Making Amount and/or Complexity of Data Reviewed Radiology: ordered. ECG/medicine tests: ordered.  Risk Prescription drug management.   66 y/o F with h/o chronic right knee pain presents to the ED for evaluation of right knee pain for the past 3 weeks.  Frontal diagnosis includes was not limited to arthritis, chronic knee pain, septic arthritis, DVT, gout, cellulitis.  Vital signs showed normotensive, afebrile, normal pulse rate, satting well on room air without any increased work of breathing.  Physical exam shows The patient is able to bend and straighten  the knee with pain.  The right knee and lower leg is slightly more swollen than the leg to the left.  There is no pitting edema.  There is no overlying skin changes other than to faint surgical scars to the  medial aspect of the upper and lower leg.  There is no crepitus.  There is no increase in warmth.  No overlying erythema.  She has palpable DP and PT pulses.  Compartments are soft.  Sensation is intact bilaterally. She has tenderness mainly to the medial aspect of the right knee. I do not palpate any deformity or step off.  Will obtain x-ray and DVT study.  I intimately reviewed and interpreted the patient's labs and imaging.  I agree with radiologist interpretation.  Ultrasound shows no evidence of DVT in the right lower extremity, there is however a right knee joint effusion that contains internal debris.  X-ray of right knee shows tricompartmental degenerative changes with moderate degenerative changes in the medial compartment and mild degenerative changes of the patellofemoral and lateral compartments.  There is no area of erosion or osseous destruction.  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  There are some vascular calcifications visualized but no significant joint effusion.  No acute fracture or dislocation.  Ultrasound request to correlate clinically for infection or hemorrhage.  I do not see any petechiae overlying the joint.  Low suspicion for any septic arthritis as there is no overlying warmth or erythema overlying the joint.  Patient is afebrile and has unremarkable vital signs.  My attending assessed at bedside and does not think this is septic arthritis and reports she can be discharged with ortho follow up.   The patient is currently seen by pain management and understands that I cannot send her home with any narcotic pain medicine.  She is just requesting pain medicine dose here as she has an appointment on  Wednesday to follow-up with pain management and vascular surgery.  I will send her home on diclofenac gel to apply topically and recommend that she continue to take her at home narcotics for her chronic pain.  Return precautions discussed such as skin changes, increase in warmth, numbness, tingling, increase in swelling to return to the nearest emergency department for reevaluation.  I encouraged her to also follow-up with her orthopedic doctor to let her know that she is still not having any resolution of pain.  Patient verbalizes understanding agrees to plan.  Patient stable to be discharged home in good condition.  I discussed this case with my attending physician who cosigned this note including patient's presenting symptoms, physical exam, and planned diagnostics and interventions. Attending physician stated agreement with plan or made changes to plan which were implemented.   Attending physician assessed patient at bedside.  Final Clinical Impression(s) / ED Diagnoses Final diagnoses:  Chronic pain of right knee    Rx / DC Orders ED Discharge Orders          Ordered    diclofenac Sodium (VOLTAREN) 1 % GEL  2 times daily PRN        06/03/21 1832              Achille Rich, PA-C 06/03/21 1833    Vanetta Mulders, MD 06/06/21 1635

## 2021-06-03 NOTE — Discharge Instructions (Addendum)
You were seen here today for evaluation of your chronic knee pain. The ultrasound shows some inflammation in the knee that you will need to follow up with your orthopedic provider about. I would still see your pain management provider and vascular provider for this issue.  I have prescribed you diclofenac gel to apply to your knee up to twice daily for pain. ? ?Contact a doctor if: ?The knee pain does not stop. ?The knee pain changes or gets worse. ?You have a fever along with knee pain. ?Your knee is red or feels warm when you touch it. ?Your knee gives out or locks up. ? ?Get help right away if: ?Your knee swells, and the swelling gets worse. ?You cannot move your knee. ?You have very bad knee pain that does not get better with pain medicine. ?

## 2021-06-05 ENCOUNTER — Other Ambulatory Visit: Payer: Self-pay | Admitting: Orthopedic Surgery

## 2021-06-05 DIAGNOSIS — G8929 Other chronic pain: Secondary | ICD-10-CM

## 2021-06-07 ENCOUNTER — Other Ambulatory Visit: Payer: Self-pay

## 2021-06-07 ENCOUNTER — Encounter: Payer: Self-pay | Admitting: *Deleted

## 2021-06-07 ENCOUNTER — Ambulatory Visit
Admission: RE | Admit: 2021-06-07 | Discharge: 2021-06-07 | Disposition: A | Discharge status: Home / Self Care | Payer: Medicare Other | Source: Ambulatory Visit | Attending: Orthopedic Surgery | Admitting: Orthopedic Surgery

## 2021-06-07 DIAGNOSIS — M25561 Pain in right knee: Secondary | ICD-10-CM

## 2021-06-07 DIAGNOSIS — G8929 Other chronic pain: Secondary | ICD-10-CM

## 2021-06-07 HISTORY — PX: IR RADIOLOGIST EVAL & MGMT: IMG5224

## 2021-06-07 NOTE — Consult Note (Signed)
Chief Complaint: Right knee pain  Referring Physician(s): Looney,[place]  History of Present Illness: Heidi Morse is a 66 y.o. female with past medical history significant for CHF, COPD, diabetes, hypertension, peripheral arterial disease and CAD (post myocardial infarction and CABG) who is seen today in telemedicine consultation for evaluation of candidacy of image guided cryoneurolysis (IOVERA) for right-sided knee pain.  The patient states that she has a long history of right-sided knee pain.  4 days ago she presented to an urgent care due to insidious onset of worsening of her right knee pain with associated swelling.  The patient states that she has received several steroid injections into her knee, and while previously the injections have resulted in improvement in her knee pain, her most recent injections have had very little efficacy.  The patient states she was considering getting "gel" injections, however her pain doctor, Dr. Allena Katz, advised her against this and she states that Dr. Allena Katz is planning on doing a procedure to "address the nerves" in her knee later this month.  She was unable to provide any additional details regarding this planned procedure.  Ultimately, the patient tells me that she is likely to require a knee replacement however needs to lose at least 50 pounds prior to doing so.   Past Medical History:  Diagnosis Date   Asthma    Bilateral carotid artery stenosis    CAD (coronary artery disease)    s/p CABG x2 in 04/2020   CHF (congestive heart failure) (HCC)    COPD (chronic obstructive pulmonary disease) (HCC)    Diabetes mellitus without complication (HCC)    Hypertension    MI (myocardial infarction) (HCC)    PAD (peripheral artery disease) (HCC)    Sleep apnea     Past Surgical History:  Procedure Laterality Date   AORTIC ROOT ENLARGEMENT N/A 04/03/2020   Procedure: AORTIC ROOT ENLARGEMENT USING HEMASHIELD PLATINUM WOVEN DOUBLE VELOUR  VASCULAR GRAFT 28 MM X 30 CM;  Surgeon: Corliss Skains, MD;  Location: [facility] OR;  Service: Open Heart Surgery;  Laterality: N/A;   AORTIC VALVE REPLACEMENT N/A 04/03/2020   Procedure: CORONARY ARTERY BYPASS GRAFTING X 2 ON CARDIOPULMONARY BYPASS. AORTIC VALVE REPLACEMENT USING 21 MM INSPIRIS RESILIA AORTIC VALVE, STERNAL PLATING;  Surgeon: Corliss Skains, MD;  Location: [facility] OR;  Service: Open Heart Surgery;  Laterality: N/A;  Coronary artery bypass grafting  Flow Trac   CORONARY STENT INTERVENTION N/A 09/12/2020   Procedure: CORONARY STENT INTERVENTION;  Surgeon: Corky Crafts, MD;  Location: [facility]VAS[facility] CV LAB;  Service: Cardiovascular;  Laterality: N/A;   ENDOVEIN HARVEST OF GREATER SAPHENOUS VEIN Right 04/03/2020   Procedure: ENDOVEIN HARVEST OF GREATER SAPHENOUS VEIN;  Surgeon: Corliss Skains, MD;  Location: [facility] OR;  Service: Open Heart Surgery;  Laterality: Right;   LEFT HEART CATH AND CORONARY ANGIOGRAPHY N/A 03/30/2020   Procedure: LEFT HEART CATH AND CORONARY ANGIOGRAPHY;  Surgeon: Kathleene Hazel, MD;  Location: [facility] INVASIVE CV LAB;  Service: Cardiovascular;  Laterality: N/A;   RIGHT/LEFT HEART CATH AND CORONARY/GRAFT ANGIOGRAPHY N/A 09/12/2020   Procedure: RIGHT/LEFT HEART CATH AND CORONARY/GRAFT ANGIOGRAPHY;  Surgeon: Corky Crafts, MD;  Location: [facility] INVASIVE CV LAB;  Service: Cardiovascular;  Laterality: N/A;   TEE WITHOUT CARDIOVERSION N/A 04/03/2020   Procedure: TRANSESOPHAGEAL ECHOCARDIOGRAM (TEE);  Surgeon: Corliss Skains, MD;  Location: [facility] OR;  Service: Open Heart Surgery;  Laterality: N/A;    Allergies: Peanut oil, Povidone-iodine, Shellfish allergy, Statins, and Iodine  Medications:  Prior to Admission medications   Medication Sig Start Date End Date Taking? Authorizing Provider  albuterol (VENTOLIN HFA) 108 (90 Base) MCG/ACT inhaler Inhale 2 puffs into the lungs every 6 (six) hours as needed for wheezing. 03/09/20   [provider]   amoxicillin (AMOXIL) 500 MG capsule Take 4 capsules (2,000 mg total) by mouth as needed (before dental work or other procedures). 09/14/20 09/14/21  Barrett, Evelene Croon, PA-C  Capsaicin-Menthol (SALONPAS GEL-PATCH HOT EX) Apply 1 patch topically 2 (two) times daily. Knee pain    [provider]  cholecalciferol (VITAMIN D3) 25 MCG (1000 UNIT) tablet Take 1,000 Units by mouth 2 (two) times daily.    [provider]  clopidogrel (PLAVIX) 75 MG tablet Take 1 tablet (75 mg total) by mouth daily with breakfast. 06/01/21   Minus Breeding, MD  cyclobenzaprine (FLEXERIL) 10 MG tablet Take 10 mg by mouth 2 (two) times daily as needed for muscle spasms. 03/24/21   [provider]  diclofenac Sodium (VOLTAREN) 1 % GEL Apply 2 g topically 2 (two) times daily as needed (joint pain). 06/03/21   Sherrell Puller, PA-C  empagliflozin (JARDIANCE) 10 MG TABS tablet Take 1 tablet (10 mg total) by mouth daily before breakfast. Patient not taking: Reported on 06/01/2021 09/14/20   Barrett, Evelene Croon, PA-C  etodolac (LODINE) 300 MG capsule Take 1 capsule (300 mg total) by mouth every 8 (eight) hours. Patient not taking: Reported on 06/01/2021 04/16/21   Dorie Rank, MD  ferrous sulfate 325 (65 FE) MG tablet Take 325 mg by mouth 2 (two) times daily. 03/24/20   [provider]  Fluticasone-Salmeterol (ADVAIR) 250-50 MCG/DOSE AEPB Inhale 1 puff into the lungs in the morning and at bedtime. 01/27/20   [provider]  gabapentin (NEURONTIN) 300 MG capsule Take 300 mg by mouth See admin instructions. Take one capsule (300 mg) by mouth twice daily - may also take 2 more times during the day as needed for pain 02/01/20   [provider]  HYDROcodone-acetaminophen (NORCO) 10-325 MG tablet Take 1 tablet by mouth every 4 (four) hours as needed (pain).    [provider]  insulin glargine (LANTUS) 100 UNIT/ML Solostar Pen Inject 30 Units into the skin at bedtime.    [provider]  insulin lispro (HUMALOG) 100 UNIT/ML KwikPen Inject 50 Units into the skin daily.    [provider]  magnesium oxide (MAG-OX) 400 (240 Mg) MG tablet Take 400 mg by mouth daily. 03/08/21   [provider]  metoprolol tartrate (LOPRESSOR) 25 MG tablet TAKE 1 AND 1/2 TABLETS BY MOUTH TWICE DAILY Patient taking differently: Take 75 mg by mouth 2 (two) times daily. 01/18/21   Minus Breeding, MD  montelukast (SINGULAIR) 10 MG tablet Take 10 mg by mouth every morning. 01/05/20   [provider]  nitroGLYCERIN (NITROSTAT) 0.4 MG SL tablet PLACE 1 TABLET UNDER THE TONGUE EVERY 5 MINUTES AS NEEDED FOR CHEST PAIN. Patient taking differently: Place 0.4 mg under the tongue every 5 (five) minutes as needed for chest pain. 11/27/20   Minus Breeding, MD  OXYGEN Inhale into the lungs as needed (shortness of breath).    [provider]  pantoprazole (PROTONIX) 40 MG tablet Take 1 tablet (40 mg total) by mouth daily. 06/01/21   Minus Breeding, MD  rosuvastatin (CRESTOR) 40 MG tablet Take 1 tablet (40 mg total) by mouth daily. 06/01/21 08/30/21  Minus Breeding, MD  valACYclovir (VALTREX) 500 MG tablet Take 500 mg by  mouth 2 (two) times daily as needed (for flare ups).    [provider]     No family history on file.  Social History   Socioeconomic History   Marital status: Single    Spouse name: Not on file   Number of children: Not on file   Years of education: Not on file   Highest education level: Not on file  Occupational History   Not on file  Tobacco Use   Smoking status: Never   Smokeless tobacco: Never  Vaping Use   Vaping Use: Never used  Substance and Sexual Activity   Alcohol use: Not Currently   Drug use: Not Currently   Sexual activity: Not on file  Other Topics Concern   Not on file  Social History Narrative   Not on file   Social Determinants of Health   Financial Resource Strain: Not on file  Food Insecurity: Not on file   Transportation Needs: Not on file  Physical Activity: Not on file  Stress: Not on file  Social Connections: Not on file    ECOG Status: 2 - Symptomatic, <50% confined to bed  Review of Systems  Review of Systems: A 12 point ROS discussed and pertinent positives are indicated in the HPI above.  All other systems are negative.  Physical Exam No direct physical exam was performed (except for noted visual exam findings with Video Visits).   Vital Signs: LMP 04/01/2000 (Approximate)   Imaging: US Venous Img Lower Unilateral Right  Result Date: 06/03/2021 CLINICAL DATA:  Pain. EXAM: Right LOWER EXTREMITY VENOUS DOPPLER ULTRASOUND TECHNIQUE: Gray-scale sonography with compression, as well as color and duplex ultrasound, were performed to evaluate the deep venous system(s) from the level of the common femoral vein through the popliteal and proximal calf veins. COMPARISON:  None. FINDINGS: VENOUS Normal compressibility of the common femoral, superficial femoral, and popliteal veins, as well as the visualized calf veins. Visualized portions of profunda femoral vein and great saphenous vein unremarkable. No filling defects to suggest DVT on grayscale or color Doppler imaging. Doppler waveforms show normal direction of venous flow, normal respiratory plasticity and response to augmentation. Limited views of the contralateral common femoral vein are unremarkable. OTHER Right knee joint effusion is present containing internal debris. Limitations: none IMPRESSION: 1. No evidence for deep venous thrombosis in the right lower extremity. 2. Right knee joint effusion contains internal debris. Correlate clinically for hemorrhage or infection. Electronically Signed   By: Ronney Asters M.D.   On: 06/03/2021 16:19   DG Knee Complete 4 Views Right  Result Date: 06/03/2021 CLINICAL DATA:  pain EXAM: RIGHT KNEE - COMPLETE 4+ VIEW COMPARISON:  April 16, 2021 FINDINGS: No acute fracture or dislocation. Osteopenia.  Tricompartmental degenerative changes with moderate degenerative changes of the medial compartment and mild degenerative changes of the patellofemoral and lateral compartments. No area of erosion or osseous destruction. No unexpected radiopaque foreign body. Surgical clips project over the inner knee. Vascular calcifications. No significant joint effusion. IMPRESSION: No acute fracture or dislocation. Electronically Signed   By: Valentino Saxon M.D.   On: 06/03/2021 15:53    Labs:  CBC: Recent Labs    09/13/20 0153 09/13/20 1952 01/25/21 0958 01/28/21 2026 04/16/21 1138  WBC 13.8*  --  7.5 10.2 8.6  HGB 7.7* 8.9* 11.7 12.1 10.3*  HCT 25.9* 28.8* 37.7 39.5 35.0*  PLT 225  --  201 200 259    COAGS: No results for input(s): INR, APTT in the  last 8760 hours.  BMP: Recent Labs    08/28/20 1113 09/12/20 0822 09/13/20 0153 01/25/21 0958 01/28/21 2026 04/16/21 1800  NA 141   < > 141 143 142 138  K 3.8   < > 4.4 4.3 4.1 4.0  CL 110  --  108 106 110 104  CO2 25  --  23 22 24 27   GLUCOSE 140*  --  241* 57* 109* 145*  BUN 18  --  26* 15 17 18   CALCIUM 8.8*  --  8.5* 9.3 9.3 8.8*  CREATININE 0.68  --  0.81 0.62 0.71 0.77  GFRNONAA >60  --  >60  --  >60 >60   < > = values in this interval not displayed.    LIVER FUNCTION TESTS: Recent Labs    01/28/21 2026  BILITOT 0.5  AST 16  ALT 18  ALKPHOS 82  PROT 7.0  ALBUMIN 3.6    TUMOR MARKERS: No results for input(s): AFPTM, CEA, CA199, CHROMGRNA in the last 8760 hours.  Assessment and Plan:  Heidi Morse is a 66 y.o. female with past medical history significant for CHF, COPD, diabetes, hypertension, peripheral arterial disease and CAD (post myocardial infarction and CABG) who is seen today in telemedicine consultation for evaluation of candidacy of image guided cryoneurolysis (IOVERA) for right-sided knee pain.  The patient states that her pain doctor, Dr. Posey Pronto, is planning on doing a procedure to "address the nerves"  in her knee later this month.  The patient was unable to provide additional details as to what this procedure may entail however it sounds somewhat similar to the cryoneurolysis procedure.  As such, I encouraged the patient to proceed with her scheduled procedure with Dr. Posey Pronto later this month and to call the interventional radiology clinic in the future if she wishes to learn more regarding IOVERA.  Thank you for this interesting consult.  I greatly enjoyed meeting Heidi Morse and look forward to participating in their care.  A copy of this report was sent to the requesting provider on this date.  Electronically Signed: Sandi Mariscal 06/07/2021, 2:22 PM   I spent a total of 15 Minutes in remote  clinical consultation, greater than 50% of which was counseling/coordinating care for right knee pain.    Visit type: Audio only (telephone). Audio (no video) only due to patient's lack of internet/smartphone capability. Alternative for in-person consultation at [facility], [address], [place], [place]. This visit type was conducted due to national recommendations for restrictions regarding the COVID-19 Pandemic (e.g. social distancing).  This format is felt to be most appropriate for this patient at this time.  All issues noted in this document were discussed and addressed.

## 2021-07-30 ENCOUNTER — Other Ambulatory Visit: Payer: Self-pay | Admitting: Cardiology

## 2021-07-31 ENCOUNTER — Other Ambulatory Visit: Payer: Self-pay | Admitting: Cardiology

## 2021-08-14 ENCOUNTER — Emergency Department (HOSPITAL_COMMUNITY)
Admission: EM | Admit: 2021-08-14 | Discharge: 2021-08-14 | Disposition: A | Discharge status: Home / Self Care | Payer: Medicare Other | Attending: Emergency Medicine | Admitting: Emergency Medicine

## 2021-08-14 ENCOUNTER — Other Ambulatory Visit: Payer: Self-pay

## 2021-08-14 ENCOUNTER — Emergency Department (HOSPITAL_COMMUNITY): Payer: Medicare Other

## 2021-08-14 DIAGNOSIS — R0602 Shortness of breath: Secondary | ICD-10-CM | POA: Diagnosis present

## 2021-08-14 DIAGNOSIS — Z7951 Long term (current) use of inhaled steroids: Secondary | ICD-10-CM | POA: Diagnosis not present

## 2021-08-14 DIAGNOSIS — E119 Type 2 diabetes mellitus without complications: Secondary | ICD-10-CM | POA: Diagnosis not present

## 2021-08-14 DIAGNOSIS — I509 Heart failure, unspecified: Secondary | ICD-10-CM | POA: Insufficient documentation

## 2021-08-14 DIAGNOSIS — G8929 Other chronic pain: Secondary | ICD-10-CM | POA: Diagnosis not present

## 2021-08-14 DIAGNOSIS — Z951 Presence of aortocoronary bypass graft: Secondary | ICD-10-CM | POA: Insufficient documentation

## 2021-08-14 DIAGNOSIS — Z91010 Allergy to peanuts: Secondary | ICD-10-CM | POA: Diagnosis not present

## 2021-08-14 DIAGNOSIS — I110 Hypertensive heart disease with heart failure: Secondary | ICD-10-CM | POA: Insufficient documentation

## 2021-08-14 DIAGNOSIS — Z794 Long term (current) use of insulin: Secondary | ICD-10-CM | POA: Diagnosis not present

## 2021-08-14 DIAGNOSIS — R0789 Other chest pain: Secondary | ICD-10-CM | POA: Insufficient documentation

## 2021-08-14 DIAGNOSIS — R2243 Localized swelling, mass and lump, lower limb, bilateral: Secondary | ICD-10-CM | POA: Insufficient documentation

## 2021-08-14 DIAGNOSIS — J449 Chronic obstructive pulmonary disease, unspecified: Secondary | ICD-10-CM | POA: Insufficient documentation

## 2021-08-14 DIAGNOSIS — I2510 Atherosclerotic heart disease of native coronary artery without angina pectoris: Secondary | ICD-10-CM | POA: Insufficient documentation

## 2021-08-14 DIAGNOSIS — M25561 Pain in right knee: Secondary | ICD-10-CM | POA: Insufficient documentation

## 2021-08-14 LAB — BRAIN NATRIURETIC PEPTIDE: B Natriuretic Peptide: 206.6 pg/mL — ABNORMAL HIGH (ref 0.0–100.0)

## 2021-08-14 LAB — BASIC METABOLIC PANEL
Anion gap: 7 (ref 5–15)
BUN: 12 mg/dL (ref 8–23)
CO2: 26 mmol/L (ref 22–32)
Calcium: 8.8 mg/dL — ABNORMAL LOW (ref 8.9–10.3)
Chloride: 106 mmol/L (ref 98–111)
Creatinine, Ser: 0.81 mg/dL (ref 0.44–1.00)
GFR, Estimated: >60 mL/min (ref >60)
Glucose, Bld: 206 mg/dL — ABNORMAL HIGH (ref 70–99)
Potassium: 3.8 mmol/L (ref 3.5–5.1)
Sodium: 139 mmol/L (ref 135–145)

## 2021-08-14 LAB — CBC WITH DIFFERENTIAL/PLATELET
Abs Immature Granulocytes: 0.02 10*3/uL (ref 0.00–0.07)
Basophils Absolute: 0 10*3/uL (ref 0.0–0.1)
Basophils Relative: 0 %
Eosinophils Absolute: 0.2 10*3/uL (ref 0.0–0.5)
Eosinophils Relative: 2 %
HCT: 35.6 % — ABNORMAL LOW (ref 36.0–46.0)
Hemoglobin: 10.5 g/dL — ABNORMAL LOW (ref 12.0–15.0)
Immature Granulocytes: 0 %
Lymphocytes Relative: 20 %
Lymphs Abs: 1.4 10*3/uL (ref 0.7–4.0)
MCH: 23.9 pg — ABNORMAL LOW (ref 26.0–34.0)
MCHC: 29.5 g/dL — ABNORMAL LOW (ref 30.0–36.0)
MCV: 80.9 fL (ref 80.0–100.0)
Monocytes Absolute: 0.5 10*3/uL (ref 0.1–1.0)
Monocytes Relative: 7 %
Neutro Abs: 5.1 10*3/uL (ref 1.7–7.7)
Neutrophils Relative %: 71 %
Platelets: 225 10*3/uL (ref 150–400)
RBC: 4.4 MIL/uL (ref 3.87–5.11)
RDW: 18.3 % — ABNORMAL HIGH (ref 11.5–15.5)
WBC: 7.2 10*3/uL (ref 4.0–10.5)
nRBC: 0 % (ref 0.0–0.2)

## 2021-08-14 LAB — TROPONIN I (HIGH SENSITIVITY)
Troponin I (High Sensitivity): 9 ng/L (ref <18)
Troponin I (High Sensitivity): 8 ng/L (ref <18)

## 2021-08-14 MED ORDER — FUROSEMIDE 10 MG/ML IJ SOLN: 80.0000 mg | Freq: Once | INTRAMUSCULAR | Status: DC

## 2021-08-14 MED ORDER — IPRATROPIUM-ALBUTEROL 0.5-2.5 (3) MG/3ML IN SOLN
3.0000 mL | Freq: Once | RESPIRATORY_TRACT | Status: AC
Start: 2021-08-14 — End: 2021-08-14
  Administered 2021-08-14: 3 mL via RESPIRATORY_TRACT
  Filled 2021-08-14: qty 3

## 2021-08-14 MED ORDER — FUROSEMIDE 10 MG/ML IJ SOLN
40.0000 mg | Freq: Once | INTRAMUSCULAR | Status: AC
  Administered 2021-08-14: 40 mg via INTRAVENOUS
  Filled 2021-08-14: qty 4

## 2021-08-14 NOTE — ED Provider Notes (Signed)
?[facility] ?Provider Note ? ? ?CSN: [id number] ?Arrival date & time: 08/14/21  [id number] ? ?  ? ?History ? ?Chief Complaint  ?Patient presents with  ? Shortness of Breath  ? Chest Pain  ? Knee Pain  ? ? ?Heidi Morse is a 66 y.o. female. ? ?66 year old female with history of DM, CHF (EF40-45% on 07/10/21 admission to [facility]), COPD, aortic valve replacement, hyperlipidemia, OSA, PAD, CAD with history of CABG, presents with complaint of chest tightness, onset last night, persists into today with DOE which resolves with rest. States she had a brief pulling sensation in her chest last night that lasted a few seconds and resolved and has not returned since. Patient states the last time she felt like this was 1 month ago when she was admitted to [facility] with CHF and PNA, treated with abx and lasix, dc home on PO lasix which she took for 1 month and discontinued about 1 week ago. Reports recently obtaining a new CPAP machine but is not using it yet as she just set it up a few days ago. Denies fevers, cough, sick contacts, does not monitor her weight. Reports chronic right knee pain, no other complaints or concerns.  ? ? ?  ? ?Home Medications ?Prior to Admission medications   ?Medication Sig Start Date End Date Taking? Authorizing Provider  ?albuterol (VENTOLIN HFA) 108 (90 Base) MCG/ACT inhaler Inhale 2 puffs into the lungs every 6 (six) hours as needed for wheezing. 03/09/20   [provider]  ?amoxicillin (AMOXIL) 500 MG capsule Take 4 capsules (2,000 mg total) by mouth as needed (before dental work or other procedures). 09/14/20 09/14/21  Barrett, Evelene Croon, PA-C  ?Capsaicin-Menthol (SALONPAS GEL-PATCH HOT EX) Apply 1 patch topically 2 (two) times daily. Knee pain    [provider]  ?cholecalciferol (VITAMIN D3) 25 MCG (1000 UNIT) tablet Take 1,000 Units by mouth 2 (two) times daily.    [provider]  ?clopidogrel (PLAVIX) 75 MG tablet TAKE 1  TABLET(75 MG) BY MOUTH DAILY WITH BREAKFAST 07/31/21   Minus Breeding, MD  ?cyclobenzaprine (FLEXERIL) 10 MG tablet Take 10 mg by mouth 2 (two) times daily as needed for muscle spasms. 03/24/21   [provider]  ?diclofenac Sodium (VOLTAREN) 1 % GEL Apply 2 g topically 2 (two) times daily as needed (joint pain). 06/03/21   Sherrell Puller, PA-C  ?empagliflozin (JARDIANCE) 10 MG TABS tablet Take 1 tablet (10 mg total) by mouth daily before breakfast. ?Patient not taking: Reported on 06/01/2021 09/14/20   Barrett, Evelene Croon, PA-C  ?etodolac (LODINE) 300 MG capsule Take 1 capsule (300 mg total) by mouth every 8 (eight) hours. ?Patient not taking: Reported on 06/01/2021 04/16/21   Dorie Rank, MD  ?ferrous sulfate 325 (65 FE) MG tablet Take 325 mg by mouth 2 (two) times daily. 03/24/20   [provider]  ?Fluticasone-Salmeterol (ADVAIR) 250-50 MCG/DOSE AEPB Inhale 1 puff into the lungs in the morning and at bedtime. 01/27/20   [provider]  ?furosemide (LASIX) 20 MG tablet Take 20 mg by mouth daily. 08/08/21   [provider]  ?gabapentin (NEURONTIN) 300 MG capsule Take 300 mg by mouth See admin instructions. Take one capsule (300 mg) by mouth twice daily - may also take 2 more times during the day as needed for pain 02/01/20   [provider]  ?HYDROcodone-acetaminophen (NORCO) 10-325 MG tablet Take 1 tablet by mouth every 4 (four) hours as  needed (pain).    [provider]  ?insulin glargine (LANTUS) 100 UNIT/ML Solostar Pen Inject 30 Units into the skin at bedtime.    [provider]  ?insulin lispro (HUMALOG) 100 UNIT/ML KwikPen Inject 50 Units into the skin daily.    [provider]  ?magnesium oxide (MAG-OX) 400 (240 Mg) MG tablet Take 400 mg by mouth daily. 03/08/21   [provider]  ?metoprolol tartrate (LOPRESSOR) 25 MG tablet TAKE 1 AND 1/2 TABLETS BY MOUTH TWICE DAILY 07/30/21   Minus Breeding, MD  ?montelukast (SINGULAIR) 10 MG tablet Take  10 mg by mouth every morning. 01/05/20   [provider]  ?nitroGLYCERIN (NITROSTAT) 0.4 MG SL tablet PLACE 1 TABLET UNDER THE TONGUE EVERY 5 MINUTES AS NEEDED FOR CHEST PAIN. ?Patient taking differently: Place 0.4 mg under the tongue every 5 (five) minutes as needed for chest pain. 11/27/20   Minus Breeding, MD  ?OXYGEN Inhale into the lungs as needed (shortness of breath).    [provider]  ?pantoprazole (PROTONIX) 40 MG tablet Take 1 tablet (40 mg total) by mouth daily. 06/01/21   Minus Breeding, MD  ?potassium chloride (KLOR-CON M) 10 MEQ tablet Take 10 mEq by mouth daily. 08/08/21   [provider]  ?rosuvastatin (CRESTOR) 40 MG tablet Take 1 tablet (40 mg total) by mouth daily. 06/01/21 08/30/21  Minus Breeding, MD  ?valACYclovir (VALTREX) 500 MG tablet Take 500 mg by mouth 2 (two) times daily as needed (for flare ups).    [provider]  ?   ? ?Allergies    ?Peanut oil, Povidone-iodine, Shellfish allergy, Statins, and Iodine   ? ?Review of Systems   ?Review of Systems ?Negative except as per HPI ?Physical Exam ?Updated Vital Signs ?BP 140/81   Pulse (!) 107   Temp 98.1 ?F (36.7 ?C) (Oral)   Resp 19   Ht 5' (1.524 m)   Wt 117.9 kg   LMP 04/01/2000 (Approximate)   SpO2 96%   BMI 50.78 kg/m?  ?Physical Exam ?Vitals and nursing note reviewed.  ?Constitutional:   ?   General: She is not in acute distress. ?   Appearance: She is well-developed. She is obese. She is not diaphoretic.  ?HENT:  ?   Head: Normocephalic and atraumatic.  ?Cardiovascular:  ?   Rate and Rhythm: Normal rate and regular rhythm.  ?Pulmonary:  ?   Effort: Pulmonary effort is normal.  ?   Breath sounds: Examination of the right-lower field reveals decreased breath sounds. Examination of the left-lower field reveals decreased breath sounds. Decreased breath sounds present.  ?Chest:  ?   Chest wall: No tenderness.  ?Abdominal:  ?   Palpations: Abdomen is soft.  ?Musculoskeletal:  ?   Right lower leg: Edema  present.  ?   Left lower leg: Edema present.  ?   Comments: Mild pitting edema bilateral lower extremities   ?Skin: ?   General: Skin is warm and dry.  ?Neurological:  ?   Mental Status: She is alert and oriented to person, place, and time.  ?Psychiatric:     ?   Behavior: Behavior normal.  ? ? ?ED Results / Procedures / Treatments   ?Labs ?(all labs ordered are listed, but only abnormal results are displayed) ?Labs Reviewed  ?BASIC METABOLIC PANEL - Abnormal; Notable for the following components:  ?    Result Value  ? Glucose, Bld 206 (*)   ? Calcium 8.8 (*)   ? All other components within  normal limits  ?CBC WITH DIFFERENTIAL/PLATELET - Abnormal; Notable for the following components:  ? Hemoglobin 10.5 (*)   ? HCT 35.6 (*)   ? MCH 23.9 (*)   ? MCHC 29.5 (*)   ? RDW 18.3 (*)   ? All other components within normal limits  ?BRAIN NATRIURETIC PEPTIDE - Abnormal; Notable for the following components:  ? B Natriuretic Peptide 206.6 (*)   ? All other components within normal limits  ?TROPONIN I (HIGH SENSITIVITY)  ?TROPONIN I (HIGH SENSITIVITY)  ? ? ?EKG ?EKG Interpretation ? ?Date/Time:  Tuesday Aug 14 2021 10:00:41 EDT ?Ventricular Rate:  97 ?PR Interval:  142 ?QRS Duration: 70 ?QT Interval:  348 ?QTC Calculation: 441 ?R Axis:   37 ?Text Interpretation: Normal sinus rhythm Non-specific ST-t changes Confirmed by Lajean Saver [phone]) on 08/14/2021 2:42:55 PM ? ?Radiology ?DG Chest 2 View ? ?Result Date: 08/14/2021 ?CLINICAL DATA:  Provided history: Shortness of breath. Additional history provided: Shortness of breath, chest tightness. EXAM: CHEST - 2 VIEW COMPARISON:  Prior chest radiographs 07/10/2021 and earlier. Chest CT 07/10/2021. FINDINGS: Prior median sternotomy/CABG and aortic valve replacement. Cardiomegaly. Aortic atherosclerosis. Central pulmonary vascular congestion and suspected mild interstitial edema. No appreciable airspace consolidation. No evidence of pleural effusion or pneumothorax. No acute bony  abnormality identified. Degenerative changes of the spine. IMPRESSION: Cardiomegaly. Central pulmonary vascular congestion and suspected mild interstitial edema. Aortic Atherosclerosis (ICD10-I70.0). Electronicall

## 2021-08-14 NOTE — ED Triage Notes (Signed)
Pt. Stated, Im having SOB and some chest tightness that started yesterday and also Im having knee pain ?

## 2021-08-14 NOTE — Discharge Instructions (Signed)
Take your Lasix as previously prescribed.  Call your cardiologist office and arrange follow-up this week.  Return to the emergency room for any worsening or concerning symptoms. ?

## 2021-08-14 NOTE — ED Provider Triage Note (Signed)
Emergency Medicine Provider Triage Evaluation Note ? ?Heidi Morse , a 66 y.o. female  was evaluated in triage.  Pt complains of shortness of breath, chest tightness that has been worse since last evening.  However patient also states she has been feeling like this now for the past 3 to 4 weeks.  She does have history of CHF, recent admission for pneumonia. ? ?Review of Systems  ?Positive: As above ?Negative: As above ? ?Physical Exam  ?BP (!) 108/59 (BP Location: Right Arm)   Pulse 99   Temp 98.2 ?F (36.8 ?C) (Oral)   Resp 18   Ht 5' (1.524 m)   Wt 117.9 kg   LMP 04/01/2000 (Approximate)   SpO2 100%   BMI 50.78 kg/m?  ?Gen:   Awake, no distress   ?Resp:  Normal effort  ?MSK:   Moves extremities without difficulty  ?Other:   ? ?Medical Decision Making  ?Medically screening exam initiated at 10:37 AM.  Appropriate orders placed.  Heidi Morse was informed that the remainder of the evaluation will be completed by another provider, this initial triage assessment does not replace that evaluation, and the importance of remaining in the ED until their evaluation is complete. ? ? ?  ?Evlyn Courier, PA-C ?08/14/21 1038 ? ?

## 2021-09-02 DIAGNOSIS — I5031 Acute diastolic (congestive) heart failure: Secondary | ICD-10-CM | POA: Insufficient documentation

## 2021-09-02 NOTE — Progress Notes (Unsigned)
Cardiology Office Note   Date:  09/03/2021   ID:  Heidi Morse, DOB [birth year], MRN [id number]  PCP:  Greer Ee., FNP  Cardiologist:   Rollene Rotunda, MD  Chief Complaint  Patient presents with   Shortness of Breath       History of Present Illness: Heidi Morse is a 66 y.o. female who presents for evaluation CAD s/p CABG, aortic stenosis s/p AVR, postop atrial fibrillation, ischemic cardiomyopathy, PAD s/p left common femoral artery stenting 11/2017, bilateral carotid artery disease s/p stenting of left ICA, COPD, HTN, HLD, DM 2, and obstructive sleep apnea.    She had chest pain and had an endoscopy in December 2021 that showed bleeding AVMs.  She was subsequently admitted at [facility] for anemia and GI Bleed.  She was seen at [facility] ED on 07/27/2019 due to persistent chest pain unrelieved by nitroglycerin.  Work-up was unremarkable.  She was felt to have noncardiac type chest discomfort.  She presented to [facility] near the end of December for evaluation of chest pain again.  Troponin was negative.  Hemoglobin was 7.6 and the she was transfused with 1 pack of red blood cell.  GI service was contacted who did not recommend any intervention.  She subsequently underwent left heart cath that showed patent left main stent with distal moderate restenosis, haziness noted via ostial LAD suggesting moderate restenosis of the stent, severe restenosis of the ostial left circumflex stent.  Echocardiogram showed EF 45 to 50%, mild LVH, grade 2 DD, mild to moderate AI, moderate aortic stenosis and moderate MR. She was seen by CT surgery and eventually underwent CABG x2 with LIMA to LAD and SVG to OM1 as well as AVR on 04/03/2020 by Dr. Cliffton Asters.  Postop course was complicated by atrial fibrillation with RVR.  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 She was given digoxin and beta-blocker.  She subsequently converted back to sinus rhythm therefore was not  started on anticoagulation therapy. She had increased SOB.  She had cath in 2022 and had high grade stenosis of an SVG for which she had angioplasty and had improvement in her symptoms.  She has been followed at [facility] for GI bleeding. She underwent endoscopy 12/22/2020 at [facility] which revealed 3 bleeding areas in her duodenum which were treated with APC, a single bleeding area in her jejunum also treated with APC.  She follows with hematology oncology Dr. Allison Quarry at [facility].  She had been placed on oral iron therapy.     Since I last saw her she was in the ED last month for acute on chronic HF.  I reviewed these records for this visit.   She received IV Lasix and was sent home.  She had not been taking her Lasix at that time.  She is also been at [facility] a couple of times and I reviewed these records as well.  At least 1 of these was for COPD flare requiring steroids and albuterol and BiPAP.  She said that since going home she is continue to have some lower extremity swelling.  She has some baseline dyspnea.  Physical therapy comes to the house.  She has been trying to watch her diet.  Her family keep strict hands on her intake.  She watches her fluid.  Sound like she is pretty good about her salt.  She is not having any new chest pressure, neck or arm discomfort.  She is  not having any new PND or orthopnea.  Her weight is gone up slightly.  Past Medical History:  Diagnosis Date   Asthma    Bilateral carotid artery stenosis    CAD (coronary artery disease)    s/p CABG x2 in 04/2020   CHF (congestive heart failure) (HCC)    COPD (chronic obstructive pulmonary disease) (HCC)    Diabetes mellitus without complication ([place])    Hypertension    MI (myocardial infarction) ([place])    PAD (peripheral artery disease) ([place])    Sleep apnea     Past Surgical History:  Procedure Laterality Date   AORTIC ROOT ENLARGEMENT N/A 04/03/2020   Procedure: AORTIC ROOT ENLARGEMENT USING HEMASHIELD PLATINUM WOVEN  DOUBLE VELOUR VASCULAR GRAFT 28 MM X 30 CM;  Surgeon: Lajuana Matte, MD;  Location: [place];  Service: Open Heart Surgery;  Laterality: N/A;   AORTIC VALVE REPLACEMENT N/A 04/03/2020   Procedure: CORONARY ARTERY BYPASS GRAFTING X 2 ON CARDIOPULMONARY BYPASS. AORTIC VALVE REPLACEMENT USING 21 MM INSPIRIS RESILIA AORTIC VALVE, STERNAL PLATING;  Surgeon: Lajuana Matte, MD;  Location: [place];  Service: Open Heart Surgery;  Laterality: N/A;  Coronary artery bypass grafting  Flow Trac   CORONARY STENT INTERVENTION N/A 09/12/2020   Procedure: CORONARY STENT INTERVENTION;  Surgeon: Jettie Booze, MD;  Location: [place] [facility];  Service: Cardiovascular;  Laterality: N/A;   ENDOVEIN HARVEST OF GREATER SAPHENOUS VEIN Right 04/03/2020   Procedure: ENDOVEIN HARVEST OF GREATER SAPHENOUS VEIN;  Surgeon: Lajuana Matte, MD;  Location: [place];  Service: Open Heart Surgery;  Laterality: Right;   IR RADIOLOGIST EVAL & MGMT  06/07/2021   LEFT HEART CATH AND CORONARY ANGIOGRAPHY N/A 03/30/2020   Procedure: LEFT HEART CATH AND CORONARY ANGIOGRAPHY;  Surgeon: Burnell Blanks, MD;  Location: [place] [facility];  Service: Cardiovascular;  Laterality: N/A;   RIGHT/LEFT HEART CATH AND CORONARY/GRAFT ANGIOGRAPHY N/A 09/12/2020   Procedure: RIGHT/LEFT HEART CATH AND CORONARY/GRAFT ANGIOGRAPHY;  Surgeon: Jettie Booze, MD;  Location: [place] [facility];  Service: Cardiovascular;  Laterality: N/A;   TEE WITHOUT CARDIOVERSION N/A 04/03/2020   Procedure: TRANSESOPHAGEAL ECHOCARDIOGRAM (TEE);  Surgeon: Lajuana Matte, MD;  Location: [place];  Service: Open Heart Surgery;  Laterality: N/A;     Current Outpatient Medications  Medication Sig Dispense Refill   albuterol (VENTOLIN HFA) 108 (90 Base) MCG/ACT inhaler Inhale 2 puffs into the lungs every 6 (six) hours as needed for wheezing.     amoxicillin (AMOXIL) 500 MG capsule Take 4 capsules (2,000 mg total) by mouth as needed (before dental work  or other procedures). 16 capsule 3   Capsaicin-Menthol (SALONPAS GEL-PATCH HOT EX) Apply 1 patch topically 2 (two) times daily. Knee pain     cholecalciferol (VITAMIN D3) 25 MCG (1000 UNIT) tablet Take 1,000 Units by mouth 2 (two) times daily.     clopidogrel (PLAVIX) 75 MG tablet TAKE 1 TABLET(75 MG) BY MOUTH DAILY WITH BREAKFAST 90 tablet 1   cyclobenzaprine (FLEXERIL) 10 MG tablet Take 10 mg by mouth 2 (two) times daily as needed for muscle spasms.     diclofenac Sodium (VOLTAREN) 1 % GEL Apply 2 g topically 2 (two) times daily as needed (joint pain). 100 g 0   empagliflozin (JARDIANCE) 10 MG TABS tablet Take 1 tablet (10 mg total) by mouth daily before breakfast. 30 tablet 11   etodolac (LODINE) 300 MG capsule Take 1 capsule (300 mg total) by mouth every 8 (eight) hours.  21 capsule 0   ferrous sulfate 325 (65 FE) MG tablet Take 325 mg by mouth 2 (two) times daily.     Fluticasone-Salmeterol (ADVAIR) 250-50 MCG/DOSE AEPB Inhale 1 puff into the lungs in the morning and at bedtime.     gabapentin (NEURONTIN) 300 MG capsule Take 300 mg by mouth See admin instructions. Take one capsule (300 mg) by mouth twice daily - may also take 2 more times during the day as needed for pain     HYDROcodone-acetaminophen (NORCO) 10-325 MG tablet Take 1 tablet by mouth every 4 (four) hours as needed (pain).     insulin glargine (LANTUS) 100 UNIT/ML Solostar Pen Inject 30 Units into the skin at bedtime.     insulin lispro (HUMALOG) 100 UNIT/ML KwikPen Inject 50 Units into the skin daily.     magnesium oxide (MAG-OX) 400 (240 Mg) MG tablet Take 400 mg by mouth daily.     metoprolol tartrate (LOPRESSOR) 25 MG tablet TAKE 1 AND 1/2 TABLETS BY MOUTH TWICE DAILY 270 tablet 3   montelukast (SINGULAIR) 10 MG tablet Take 10 mg by mouth every morning.     nitroGLYCERIN (NITROSTAT) 0.4 MG SL tablet PLACE 1 TABLET UNDER THE TONGUE EVERY 5 MINUTES AS NEEDED FOR CHEST PAIN. (Patient taking differently: Place 0.4 mg under the  tongue every 5 (five) minutes as needed for chest pain.) 25 tablet 3   OXYGEN Inhale into the lungs as needed (shortness of breath).     pantoprazole (PROTONIX) 40 MG tablet Take 1 tablet (40 mg total) by mouth daily. 30 tablet 11   potassium chloride (KLOR-CON M) 10 MEQ tablet Take 10 mEq by mouth daily.     valACYclovir (VALTREX) 500 MG tablet Take 500 mg by mouth 2 (two) times daily as needed (for flare ups).     furosemide (LASIX) 40 MG tablet Take 1 tablet (40 mg total) by mouth daily. 90 tablet 3   rosuvastatin (CRESTOR) 40 MG tablet Take 1 tablet (40 mg total) by mouth daily. 90 tablet 3   No current facility-administered medications for this visit.    Allergies:   Peanut oil, Povidone-iodine, Shellfish allergy, Statins, and Iodine    ROS:  Please see the history of present illness.   Otherwise, review of systems are positive for joint pains.   All other systems are reviewed and negative.    PHYSICAL EXAM: VS:  BP 126/74   Pulse 98   Ht 5' (1.524 m)   Wt 263 lb 9.6 oz (119.6 kg)   LMP 04/01/2000 (Approximate)   SpO2 98%   BMI 51.48 kg/m  , BMI Body mass index is 51.48 kg/m. GENERAL:  Well appearing NECK:  No jugular venous distention, waveform within normal limits, carotid upstroke brisk and symmetric, no bruits, no thyromegaly LUNGS:  Clear to auscultation bilaterally CHEST:  Well healed sternotomy scar. HEART:  PMI not displaced or sustained,S1 and S2 within normal limits, no S3, no S4, no clicks, no rubs, 2 out of 6 apical systolic murmur radiating slightly at aortic outflow tract murmurs ABD:  Flat, positive bowel sounds normal in frequency in pitch, no bruits, no rebound, no guarding, no midline pulsatile mass, no hepatomegaly, no splenomegaly EXT:  2 plus pulses throughout, no edema, no cyanosis no clubbing   EKG:  EKG is  ordered today. Sinus rhythm, rate 98, axis within normal limits, mild QT prolongation poor anterior R wave progression,   Recent  Labs: 01/25/2021: TSH 2.520 01/28/2021: ALT 18 08/14/2021: B  Natriuretic Peptide 206.6; BUN 12; Creatinine, Ser 0.81; Hemoglobin 10.5; Platelets 225; Potassium 3.8; Sodium 139    Lipid Panel    Component Value Date/Time   CHOL 155 09/13/2020 0815   TRIG 60 09/13/2020 0815   HDL 43 09/13/2020 0815   CHOLHDL 3.6 09/13/2020 0815   VLDL 12 09/13/2020 0815   LDLCALC 100 (H) 09/13/2020 0815      Wt Readings from Last 3 Encounters:  09/03/21 263 lb 9.6 oz (119.6 kg)  08/14/21 260 lb (117.9 kg)  06/03/21 250 lb (113.4 kg)    Diagnostic cadiac cath 08/2020 Dominance: Right    Intervention       Other studies Reviewed: Additional studies/ records that were reviewed today include: [facility] records,: ED records Review of the above records demonstrates:  Please see elsewhere in the note.    ASSESSMENT AND PLAN:  Symptomatic Anemia She has is managed by her primary provider and gets iron.   CAD She has had no active chest discomfort.  She will continue with aggressive risk reduction  Acute on chronic diastolic HF This is her first office visit since the ED visit for this diagnosis.  I will increase her Lasix to 40 mg daily as her weight is up a little bit and she does have a little lower extremity swelling.  She will get a basic metabolic profile in another 10 days or so.   HLD Total cholesterol was 100 which is much improved and HDL 43.  She has been intolerant to other statins and I think it is reasonable for her to continue with Crestor.   HTN Blood pressure is at target.  No change in therapy.   Dsypnea She uses as needed home O2.   AVR She was to get an echo prior to this visit.  I will reschedule this.   Obesity She will continue to work on this with diet.    Current medicines are reviewed at length with the patient today.  The patient does not have concerns regarding medicines.  The following changes have been made: As above  Labs/ tests ordered today  include:    Orders Placed This Encounter  Procedures   Basic metabolic panel   EKG [id number]   ECHOCARDIOGRAM COMPLETE    Disposition:   FU with APP in four months.    Signed, Minus Breeding, MD  09/03/2021 9:54 AM    [facility]

## 2021-09-03 ENCOUNTER — Ambulatory Visit (INDEPENDENT_AMBULATORY_CARE_PROVIDER_SITE_OTHER): Payer: Medicare Other | Admitting: Cardiology

## 2021-09-03 ENCOUNTER — Encounter: Payer: Self-pay | Admitting: Cardiology

## 2021-09-03 VITALS — BP 126/74 | HR 98 | Ht 60.0 in | Wt 263.6 lb

## 2021-09-03 DIAGNOSIS — R0602 Shortness of breath: Secondary | ICD-10-CM

## 2021-09-03 DIAGNOSIS — I10 Essential (primary) hypertension: Secondary | ICD-10-CM

## 2021-09-03 DIAGNOSIS — Z952 Presence of prosthetic heart valve: Secondary | ICD-10-CM

## 2021-09-03 DIAGNOSIS — I25700 Atherosclerosis of coronary artery bypass graft(s), unspecified, with unstable angina pectoris: Secondary | ICD-10-CM

## 2021-09-03 DIAGNOSIS — I5031 Acute diastolic (congestive) heart failure: Secondary | ICD-10-CM | POA: Diagnosis not present

## 2021-09-03 DIAGNOSIS — Z79899 Other long term (current) drug therapy: Secondary | ICD-10-CM

## 2021-09-03 MED ORDER — FUROSEMIDE 40 MG PO TABS: 40.0000 mg | ORAL_TABLET | Freq: Every day | ORAL | 3 refills | Status: DC

## 2021-09-03 NOTE — Patient Instructions (Signed)
Medication Instructions:  Increase lasix to 40 mg daily  *If you need a refill on your cardiac medications before your next appointment, please call your pharmacy*   Lab Work: Your physician recommends that you return for lab work in 10 days (BMP)    Testing/Procedures: Your physician has requested that you have an echocardiogram in 10 days. Echocardiography is a painless test that uses sound waves to create images of your heart. It provides your doctor with information about the size and shape of your heart and how well your heart's chambers and valves are working. This procedure takes approximately one hour. There are no restrictions for this procedure. [address]    Follow-Up: At [facility], you and your health needs are our priority.  As part of our continuing mission to provide you with exceptional heart care, we have created designated Provider Care Teams.  These Care Teams include your primary Cardiologist (physician) and Advanced Practice Providers (APPs -  Physician Assistants and Nurse Practitioners) who all work together to provide you with the care you need, when you need it.  We recommend signing up for the patient portal called "MyChart".  Sign up information is provided on this After Visit Summary.  MyChart is used to connect with patients for Virtual Visits (Telemedicine).  Patients are able to view lab/test results, encounter notes, upcoming appointments, etc.  Non-urgent messages can be sent to your provider as well.   To learn more about what you can do with MyChart, go to [email or web address].    Your next appointment:   4 month(s)  The format for your next appointment:   In Person  Provider:   Azalee Course, PA-C    {

## 2021-09-12 ENCOUNTER — Other Ambulatory Visit (HOSPITAL_COMMUNITY): Payer: Medicare Other

## 2021-09-21 ENCOUNTER — Ambulatory Visit (HOSPITAL_COMMUNITY): Payer: Medicare Other | Attending: Cardiology

## 2021-09-21 DIAGNOSIS — I5031 Acute diastolic (congestive) heart failure: Secondary | ICD-10-CM | POA: Diagnosis present

## 2021-09-21 DIAGNOSIS — R0602 Shortness of breath: Secondary | ICD-10-CM | POA: Insufficient documentation

## 2021-09-21 LAB — ECHOCARDIOGRAM COMPLETE
AR max vel: 0.62 cm2
AV Area VTI: 0.7 cm2
AV Area mean vel: 0.65 cm2
AV Mean grad: 18.5 mmHg
AV Peak grad: 35 mmHg
Ao pk vel: 2.96 m/s
Area-P 1/2: 3.31 cm2
P 1/2 time: 385 msec
S' Lateral: 4.4 cm

## 2021-09-24 ENCOUNTER — Telehealth: Payer: Self-pay | Admitting: *Deleted

## 2021-09-24 NOTE — Telephone Encounter (Signed)
pt aware of results, she reports increased SOB and a tightness in her chest that started this morning. She plans on called EMS when her son gets home. Aware she can take an extra 40 mg of furosemide now if needed.

## 2021-10-12 ENCOUNTER — Encounter (HOSPITAL_COMMUNITY): Payer: Self-pay

## 2021-10-12 ENCOUNTER — Other Ambulatory Visit: Payer: Self-pay

## 2021-10-12 ENCOUNTER — Observation Stay (HOSPITAL_COMMUNITY)
Admission: EM | Admit: 2021-10-12 | Discharge: 2021-10-13 | Disposition: A | Discharge status: Home / Self Care | Payer: Medicare Other | Attending: Internal Medicine | Admitting: Internal Medicine

## 2021-10-12 ENCOUNTER — Emergency Department (HOSPITAL_COMMUNITY): Payer: Medicare Other

## 2021-10-12 DIAGNOSIS — Z7984 Long term (current) use of oral hypoglycemic drugs: Secondary | ICD-10-CM | POA: Diagnosis not present

## 2021-10-12 DIAGNOSIS — I4891 Unspecified atrial fibrillation: Secondary | ICD-10-CM | POA: Insufficient documentation

## 2021-10-12 DIAGNOSIS — Z91010 Allergy to peanuts: Secondary | ICD-10-CM | POA: Diagnosis not present

## 2021-10-12 DIAGNOSIS — Z955 Presence of coronary angioplasty implant and graft: Secondary | ICD-10-CM | POA: Diagnosis not present

## 2021-10-12 DIAGNOSIS — R072 Precordial pain: Principal | ICD-10-CM | POA: Insufficient documentation

## 2021-10-12 DIAGNOSIS — Z794 Long term (current) use of insulin: Secondary | ICD-10-CM | POA: Diagnosis not present

## 2021-10-12 DIAGNOSIS — Z7902 Long term (current) use of antithrombotics/antiplatelets: Secondary | ICD-10-CM | POA: Insufficient documentation

## 2021-10-12 DIAGNOSIS — Z79899 Other long term (current) drug therapy: Secondary | ICD-10-CM | POA: Insufficient documentation

## 2021-10-12 DIAGNOSIS — J449 Chronic obstructive pulmonary disease, unspecified: Secondary | ICD-10-CM | POA: Insufficient documentation

## 2021-10-12 DIAGNOSIS — E1142 Type 2 diabetes mellitus with diabetic polyneuropathy: Secondary | ICD-10-CM | POA: Diagnosis not present

## 2021-10-12 DIAGNOSIS — I25810 Atherosclerosis of coronary artery bypass graft(s) without angina pectoris: Secondary | ICD-10-CM | POA: Diagnosis not present

## 2021-10-12 DIAGNOSIS — Z952 Presence of prosthetic heart valve: Secondary | ICD-10-CM | POA: Insufficient documentation

## 2021-10-12 DIAGNOSIS — R079 Chest pain, unspecified: Secondary | ICD-10-CM

## 2021-10-12 DIAGNOSIS — G4733 Obstructive sleep apnea (adult) (pediatric): Secondary | ICD-10-CM | POA: Diagnosis present

## 2021-10-12 DIAGNOSIS — I5042 Chronic combined systolic (congestive) and diastolic (congestive) heart failure: Secondary | ICD-10-CM | POA: Diagnosis not present

## 2021-10-12 DIAGNOSIS — E1169 Type 2 diabetes mellitus with other specified complication: Secondary | ICD-10-CM | POA: Diagnosis not present

## 2021-10-12 DIAGNOSIS — Z951 Presence of aortocoronary bypass graft: Secondary | ICD-10-CM | POA: Insufficient documentation

## 2021-10-12 DIAGNOSIS — I110 Hypertensive heart disease with heart failure: Secondary | ICD-10-CM | POA: Diagnosis not present

## 2021-10-12 DIAGNOSIS — K219 Gastro-esophageal reflux disease without esophagitis: Secondary | ICD-10-CM | POA: Diagnosis present

## 2021-10-12 DIAGNOSIS — J45909 Unspecified asthma, uncomplicated: Secondary | ICD-10-CM | POA: Diagnosis not present

## 2021-10-12 DIAGNOSIS — E782 Mixed hyperlipidemia: Secondary | ICD-10-CM | POA: Diagnosis not present

## 2021-10-12 DIAGNOSIS — I10 Essential (primary) hypertension: Secondary | ICD-10-CM | POA: Diagnosis present

## 2021-10-12 DIAGNOSIS — D500 Iron deficiency anemia secondary to blood loss (chronic): Secondary | ICD-10-CM | POA: Diagnosis present

## 2021-10-12 LAB — CBC WITH DIFFERENTIAL/PLATELET
Abs Immature Granulocytes: 0.03 10*3/uL (ref 0.00–0.07)
Basophils Absolute: 0 10*3/uL (ref 0.0–0.1)
Basophils Relative: 0 %
Eosinophils Absolute: 0.2 10*3/uL (ref 0.0–0.5)
Eosinophils Relative: 2 %
HCT: 32.3 % — ABNORMAL LOW (ref 36.0–46.0)
Hemoglobin: 9.4 g/dL — ABNORMAL LOW (ref 12.0–15.0)
Immature Granulocytes: 0 %
Lymphocytes Relative: 19 %
Lymphs Abs: 1.8 10*3/uL (ref 0.7–4.0)
MCH: 24.9 pg — ABNORMAL LOW (ref 26.0–34.0)
MCHC: 29.1 g/dL — ABNORMAL LOW (ref 30.0–36.0)
MCV: 85.4 fL (ref 80.0–100.0)
Monocytes Absolute: 0.9 10*3/uL (ref 0.1–1.0)
Monocytes Relative: 9 %
Neutro Abs: 6.7 10*3/uL (ref 1.7–7.7)
Neutrophils Relative %: 70 %
Platelets: 209 10*3/uL (ref 150–400)
RBC: 3.78 MIL/uL — ABNORMAL LOW (ref 3.87–5.11)
RDW: 19.9 % — ABNORMAL HIGH (ref 11.5–15.5)
WBC: 9.7 10*3/uL (ref 4.0–10.5)
nRBC: 0 % (ref 0.0–0.2)

## 2021-10-12 LAB — BASIC METABOLIC PANEL
Anion gap: 10 (ref 5–15)
BUN: 13 mg/dL (ref 8–23)
CO2: 25 mmol/L (ref 22–32)
Calcium: 9.1 mg/dL (ref 8.9–10.3)
Chloride: 108 mmol/L (ref 98–111)
Creatinine, Ser: 0.85 mg/dL (ref 0.44–1.00)
GFR, Estimated: >60 mL/min (ref >60)
Glucose, Bld: 89 mg/dL (ref 70–99)
Potassium: 4.1 mmol/L (ref 3.5–5.1)
Sodium: 143 mmol/L (ref 135–145)

## 2021-10-12 LAB — TROPONIN I (HIGH SENSITIVITY)
Troponin I (High Sensitivity): 13 ng/L (ref <18)
Troponin I (High Sensitivity): 13 ng/L (ref <18)

## 2021-10-12 LAB — BRAIN NATRIURETIC PEPTIDE: B Natriuretic Peptide: 160.7 pg/mL — ABNORMAL HIGH (ref 0.0–100.0)

## 2021-10-12 NOTE — ED Provider Notes (Addendum)
[facility] EMERGENCY DEPARTMENT Provider Note   CSN: [id number] Arrival date & time: 10/12/21  1637     History  Chief Complaint  Patient presents with   Chest Pain    Heidi Morse is a 66 y.o. female.  66 year old female with prior medical history as detailed below presents for evaluation.  Patient reports intermittent chest pain and shortness of breath for the last 3 to 4 days.  Patient reports that she wears 2 L nasal cannula at baseline.  She reports that she does not have chest pain typically.  She denies fever.  She reports that symptoms are improved at time of evaluation.  She denies associated nausea or vomiting.  The history is provided by the patient and medical records.  Chest Pain Pain location:  Substernal area Pain quality: aching   Pain radiates to:  Does not radiate Pain severity:  Mild Onset quality:  Gradual Duration:  3 days Timing:  Intermittent Progression:  Waxing and waning      Home Medications Prior to Admission medications   Medication Sig Start Date End Date Taking? Authorizing Provider  albuterol (VENTOLIN HFA) 108 (90 Base) MCG/ACT inhaler Inhale 2 puffs into the lungs every 6 (six) hours as needed for wheezing. 03/09/20   [provider]  Capsaicin-Menthol (SALONPAS GEL-PATCH HOT EX) Apply 1 patch topically 2 (two) times daily. Knee pain    [provider]  cholecalciferol (VITAMIN D3) 25 MCG (1000 UNIT) tablet Take 1,000 Units by mouth 2 (two) times daily.    [provider]  clopidogrel (PLAVIX) 75 MG tablet TAKE 1 TABLET(75 MG) BY MOUTH DAILY WITH BREAKFAST 07/31/21   Rollene Rotunda, MD  cyclobenzaprine (FLEXERIL) 10 MG tablet Take 10 mg by mouth 2 (two) times daily as needed for muscle spasms. 03/24/21   [provider]  diclofenac Sodium (VOLTAREN) 1 % GEL Apply 2 g topically 2 (two) times daily as needed (joint pain). 06/03/21   Achille Rich, PA-C  empagliflozin (JARDIANCE) 10  MG TABS tablet Take 1 tablet (10 mg total) by mouth daily before breakfast. 09/14/20   Barrett, Joline Salt, PA-C  etodolac (LODINE) 300 MG capsule Take 1 capsule (300 mg total) by mouth every 8 (eight) hours. 04/16/21   Linwood Dibbles, MD  ferrous sulfate 325 (65 FE) MG tablet Take 325 mg by mouth 2 (two) times daily. 03/24/20   [provider]  Fluticasone-Salmeterol (ADVAIR) 250-50 MCG/DOSE AEPB Inhale 1 puff into the lungs in the morning and at bedtime. 01/27/20   [provider]  furosemide (LASIX) 40 MG tablet Take 1 tablet (40 mg total) by mouth daily. 09/03/21   Rollene Rotunda, MD  gabapentin (NEURONTIN) 300 MG capsule Take 300 mg by mouth See admin instructions. Take one capsule (300 mg) by mouth twice daily - may also take 2 more times during the day as needed for pain 02/01/20   [provider]  HYDROcodone-acetaminophen (NORCO) 10-325 MG tablet Take 1 tablet by mouth every 4 (four) hours as needed (pain).    [provider]  insulin glargine (LANTUS) 100 UNIT/ML Solostar Pen Inject 30 Units into the skin at bedtime.    [provider]  insulin lispro (HUMALOG) 100 UNIT/ML KwikPen Inject 50 Units into the skin daily.    [provider]  magnesium oxide (MAG-OX) 400 (240 Mg) MG tablet Take 400 mg by mouth daily. 03/08/21   [provider]  metoprolol tartrate (LOPRESSOR) 25 MG tablet TAKE 1 AND 1/2 TABLETS  BY MOUTH TWICE DAILY 07/30/21   Rollene Rotunda, MD  montelukast (SINGULAIR) 10 MG tablet Take 10 mg by mouth every morning. 01/05/20   [provider]  nitroGLYCERIN (NITROSTAT) 0.4 MG SL tablet PLACE 1 TABLET UNDER THE TONGUE EVERY 5 MINUTES AS NEEDED FOR CHEST PAIN. Patient taking differently: Place 0.4 mg under the tongue every 5 (five) minutes as needed for chest pain. 11/27/20   Rollene Rotunda, MD  OXYGEN Inhale into the lungs as needed (shortness of breath).    [provider]  pantoprazole (PROTONIX) 40 MG tablet  Take 1 tablet (40 mg total) by mouth daily. 06/01/21   Rollene Rotunda, MD  potassium chloride (KLOR-CON M) 10 MEQ tablet Take 10 mEq by mouth daily. 08/08/21   [provider]  rosuvastatin (CRESTOR) 40 MG tablet Take 1 tablet (40 mg total) by mouth daily. 06/01/21 08/30/21  Rollene Rotunda, MD  valACYclovir (VALTREX) 500 MG tablet Take 500 mg by mouth 2 (two) times daily as needed (for flare ups).    [provider]      Allergies    Peanut oil, Povidone-iodine, Shellfish allergy, Statins, and Iodine    Review of Systems   Review of Systems  Cardiovascular:  Positive for chest pain.  All other systems reviewed and are negative.   Physical Exam Updated Vital Signs BP 110/64   Pulse 74   Temp 98.3 F (36.8 C) (Oral)   Resp 20   Ht 5' (1.524 m)   Wt 119 kg   LMP 04/01/2000 (Approximate)   SpO2 99%   BMI 51.24 kg/m  Physical Exam Vitals and nursing note reviewed.  Constitutional:      General: She is not in acute distress.    Appearance: Normal appearance. She is well-developed.  HENT:     Head: Normocephalic and atraumatic.  Eyes:     Conjunctiva/sclera: Conjunctivae normal.     Pupils: Pupils are equal, round, and reactive to light.  Cardiovascular:     Rate and Rhythm: Normal rate and regular rhythm.     Heart sounds: Normal heart sounds.  Pulmonary:     Effort: Pulmonary effort is normal. No respiratory distress.     Breath sounds: Normal breath sounds.  Abdominal:     General: There is no distension.     Palpations: Abdomen is soft.     Tenderness: There is no abdominal tenderness.  Musculoskeletal:        General: No deformity. Normal range of motion.     Cervical back: Normal range of motion and neck supple.  Skin:    General: Skin is warm and dry.  Neurological:     General: No focal deficit present.     Mental Status: She is alert and oriented to person, place, and time.     ED Results / Procedures / Treatments   Labs (all labs ordered  are listed, but only abnormal results are displayed) Labs Reviewed  CBC WITH DIFFERENTIAL/PLATELET - Abnormal; Notable for the following components:      Result Value   RBC 3.78 (*)    Hemoglobin 9.4 (*)    HCT 32.3 (*)    MCH 24.9 (*)    MCHC 29.1 (*)    RDW 19.9 (*)    All other components within normal limits  BRAIN NATRIURETIC PEPTIDE - Abnormal; Notable for the following components:   B Natriuretic Peptide 160.7 (*)    All other components within normal limits  BASIC METABOLIC PANEL  TROPONIN  I (HIGH SENSITIVITY)  TROPONIN I (HIGH SENSITIVITY)    EKG EKG Interpretation  Date/Time:  Friday October 12 2021 16:54:00 EDT Ventricular Rate:  76 PR Interval:  151 QRS Duration: 79 QT Interval:  409 QTC Calculation: 460 R Axis:   8 Text Interpretation: Sinus rhythm Confirmed by Kristine Royal [phone]) on 10/12/2021 4:54:53 PM  Radiology DG Chest Port 1 View  Result Date: 10/12/2021 CLINICAL DATA:  Chest pain and shortness of breath for several days. EXAM: PORTABLE CHEST 1 VIEW COMPARISON:  09/24/2021 FINDINGS: Moderate cardiomegaly is stable. Prior CABG and aortic valve replacement noted. Mild scarring again seen in the left midlung. The lungs are otherwise clear. IMPRESSION: Stable cardiomegaly. No active lung disease. Electronically Signed   By: Heidi Morse M.D.   On: 10/12/2021 17:04    Procedures Procedures    Medications Ordered in ED Medications - No data to display  ED Course/ Medical Decision Making/ A&P                           Medical Decision Making Amount and/or Complexity of Data Reviewed Labs: ordered. Radiology: ordered.  Risk Decision regarding hospitalization.    Medical Screen Complete  This patient presented to the ED with complaint of chest pain, shortness of breath.  This complaint involves an extensive number of treatment options. The initial differential diagnosis includes, but is not limited to, ACS, angina, metabolic abnormality,  etc.  This presentation is: Acute, Chronic, Self-Limited, Previously Undiagnosed, Uncertain Prognosis, Complicated, Systemic Symptoms, and Threat to Life/Bodily Function  Patient with multiple comorbidities including advanced CAD status post CABG presents with complaint of chest pain.  Screening labs obtained are without significant abnormality  However, patient with known history of CAD and significant other comorbidities.  Patient would likely benefit from overnight observation and continued work-up.  Hospitalist service  [facility]) made aware of case and will evaluate for admission.  Additional history obtained:  External records from outside sources obtained and reviewed including prior ED visits and prior Inpatient records.    Lab Tests:  I ordered and personally interpreted labs.  The pertinent results include: CBC, BMP, troponin, BNP   Imaging Studies ordered:  I ordered imaging studies including chest x-ray I independently visualized and interpreted obtained imaging which showed NAD I agree with the radiologist interpretation.   Cardiac Monitoring:  The patient was maintained on a cardiac monitor.  I personally viewed and interpreted the cardiac monitor which showed an underlying rhythm of: NSR  Problem List / ED Course:  Chest pain   Reevaluation:  After the interventions noted above, I reevaluated the patient and found that they have: improved   Disposition:  After consideration of the diagnostic results and the patients response to treatment, I feel that the patent would benefit from admission.          Final Clinical Impression(s) / ED Diagnoses Final diagnoses:  Chest pain, unspecified type    Rx / DC Orders ED Discharge Orders     None         Wynetta Fines, MD 10/12/21 2240    Wynetta Fines, MD 10/12/21 2333

## 2021-10-12 NOTE — ED Triage Notes (Signed)
PT BIB EMS from home c/o CP for the past 3 days. Pt states that she has pressure type pain in the center of her chest. She states her pain is 5/10 but goes up to 8/10 with exertion.   Pt has previous hx of SOB/CP and chronic pain w/ exertion.  VSS w/ EMS

## 2021-10-13 ENCOUNTER — Other Ambulatory Visit (HOSPITAL_COMMUNITY): Payer: Medicare Other

## 2021-10-13 ENCOUNTER — Observation Stay (HOSPITAL_BASED_OUTPATIENT_CLINIC_OR_DEPARTMENT_OTHER): Payer: Medicare Other

## 2021-10-13 ENCOUNTER — Encounter (HOSPITAL_COMMUNITY): Payer: Self-pay | Admitting: Internal Medicine

## 2021-10-13 DIAGNOSIS — I5042 Chronic combined systolic (congestive) and diastolic (congestive) heart failure: Secondary | ICD-10-CM

## 2021-10-13 DIAGNOSIS — Z794 Long term (current) use of insulin: Secondary | ICD-10-CM

## 2021-10-13 DIAGNOSIS — J4489 Other specified chronic obstructive pulmonary disease: Secondary | ICD-10-CM | POA: Diagnosis present

## 2021-10-13 DIAGNOSIS — D500 Iron deficiency anemia secondary to blood loss (chronic): Secondary | ICD-10-CM

## 2021-10-13 DIAGNOSIS — E1169 Type 2 diabetes mellitus with other specified complication: Secondary | ICD-10-CM

## 2021-10-13 DIAGNOSIS — R079 Chest pain, unspecified: Secondary | ICD-10-CM | POA: Diagnosis not present

## 2021-10-13 DIAGNOSIS — R072 Precordial pain: Secondary | ICD-10-CM | POA: Diagnosis not present

## 2021-10-13 DIAGNOSIS — E782 Mixed hyperlipidemia: Secondary | ICD-10-CM

## 2021-10-13 DIAGNOSIS — E1142 Type 2 diabetes mellitus with diabetic polyneuropathy: Secondary | ICD-10-CM

## 2021-10-13 DIAGNOSIS — G4733 Obstructive sleep apnea (adult) (pediatric): Secondary | ICD-10-CM | POA: Diagnosis present

## 2021-10-13 DIAGNOSIS — I10 Essential (primary) hypertension: Secondary | ICD-10-CM

## 2021-10-13 DIAGNOSIS — I25709 Atherosclerosis of coronary artery bypass graft(s), unspecified, with unspecified angina pectoris: Secondary | ICD-10-CM

## 2021-10-13 DIAGNOSIS — K219 Gastro-esophageal reflux disease without esophagitis: Secondary | ICD-10-CM | POA: Diagnosis present

## 2021-10-13 DIAGNOSIS — J449 Chronic obstructive pulmonary disease, unspecified: Secondary | ICD-10-CM | POA: Diagnosis not present

## 2021-10-13 LAB — HEMOGLOBIN A1C
Hgb A1c MFr Bld: 6.8 % — ABNORMAL HIGH (ref 4.8–5.6)
Mean Plasma Glucose: 148.46 mg/dL

## 2021-10-13 LAB — ECHOCARDIOGRAM COMPLETE
AV Mean grad: 20 mmHg
AV Peak grad: 36.2 mmHg
Ao pk vel: 3.01 m/s
Area-P 1/2: 2.56 cm2
Height: 60 in
MV M vel: 5.12 m/s
MV Peak grad: 104.9 mmHg
Radius: 0.3 cm
S' Lateral: 4.4 cm
Single Plane A4C EF: 47.1 %
Weight: 4197.56 oz

## 2021-10-13 LAB — HIV ANTIBODY (ROUTINE TESTING W REFLEX): HIV Screen 4th Generation wRfx: NONREACTIVE

## 2021-10-13 LAB — CBC WITH DIFFERENTIAL/PLATELET
Abs Immature Granulocytes: 0.03 10*3/uL (ref 0.00–0.07)
Basophils Absolute: 0 10*3/uL (ref 0.0–0.1)
Basophils Relative: 0 %
Eosinophils Absolute: 0.2 10*3/uL (ref 0.0–0.5)
Eosinophils Relative: 2 %
HCT: 28.1 % — ABNORMAL LOW (ref 36.0–46.0)
Hemoglobin: 8.5 g/dL — ABNORMAL LOW (ref 12.0–15.0)
Immature Granulocytes: 0 %
Lymphocytes Relative: 16 %
Lymphs Abs: 1.3 10*3/uL (ref 0.7–4.0)
MCH: 24.8 pg — ABNORMAL LOW (ref 26.0–34.0)
MCHC: 30.2 g/dL (ref 30.0–36.0)
MCV: 81.9 fL (ref 80.0–100.0)
Monocytes Absolute: 0.7 10*3/uL (ref 0.1–1.0)
Monocytes Relative: 8 %
Neutro Abs: 6.2 10*3/uL (ref 1.7–7.7)
Neutrophils Relative %: 74 %
Platelets: 176 10*3/uL (ref 150–400)
RBC: 3.43 MIL/uL — ABNORMAL LOW (ref 3.87–5.11)
RDW: 19.7 % — ABNORMAL HIGH (ref 11.5–15.5)
WBC: 8.5 10*3/uL (ref 4.0–10.5)
nRBC: 0 % (ref 0.0–0.2)

## 2021-10-13 LAB — COMPREHENSIVE METABOLIC PANEL
ALT: 15 U/L (ref 0–44)
AST: 15 U/L (ref 15–41)
Albumin: 2.8 g/dL — ABNORMAL LOW (ref 3.5–5.0)
Alkaline Phosphatase: 62 U/L (ref 38–126)
Anion gap: 12 (ref 5–15)
BUN: 15 mg/dL (ref 8–23)
CO2: 25 mmol/L (ref 22–32)
Calcium: 8.6 mg/dL — ABNORMAL LOW (ref 8.9–10.3)
Chloride: 106 mmol/L (ref 98–111)
Creatinine, Ser: 0.75 mg/dL (ref 0.44–1.00)
GFR, Estimated: >60 mL/min (ref >60)
Glucose, Bld: 180 mg/dL — ABNORMAL HIGH (ref 70–99)
Potassium: 3.7 mmol/L (ref 3.5–5.1)
Sodium: 143 mmol/L (ref 135–145)
Total Bilirubin: 0.6 mg/dL (ref 0.3–1.2)
Total Protein: 5.8 g/dL — ABNORMAL LOW (ref 6.5–8.1)

## 2021-10-13 LAB — LIPID PANEL
Cholesterol: 189 mg/dL (ref 0–200)
HDL: 40 mg/dL — ABNORMAL LOW (ref >40)
LDL Cholesterol: 128 mg/dL — ABNORMAL HIGH (ref 0–99)
Total CHOL/HDL Ratio: 4.7 RATIO
Triglycerides: 103 mg/dL (ref <150)
VLDL: 21 mg/dL (ref 0–40)

## 2021-10-13 LAB — MAGNESIUM: Magnesium: 1.8 mg/dL (ref 1.7–2.4)

## 2021-10-13 LAB — GLUCOSE, CAPILLARY
Glucose-Capillary: 179 mg/dL — ABNORMAL HIGH (ref 70–99)
Glucose-Capillary: 120 mg/dL — ABNORMAL HIGH (ref 70–99)

## 2021-10-13 LAB — CBG MONITORING, ED: Glucose-Capillary: 125 mg/dL — ABNORMAL HIGH (ref 70–99)

## 2021-10-13 LAB — TROPONIN I (HIGH SENSITIVITY): Troponin I (High Sensitivity): 13 ng/L (ref <18)

## 2021-10-13 MED ORDER — POLYETHYLENE GLYCOL 3350 17 G PO PACK: 17.0000 g | PACK | Freq: Every day | ORAL | Status: DC | PRN

## 2021-10-13 MED ORDER — INSULIN ASPART 100 UNIT/ML IJ SOLN
0.0000 [IU] | Freq: Three times a day (TID) | INTRAMUSCULAR | Status: DC
  Administered 2021-10-13: 2 [IU] via SUBCUTANEOUS
  Administered 2021-10-13: 3 [IU] via SUBCUTANEOUS

## 2021-10-13 MED ORDER — FERROUS SULFATE 325 (65 FE) MG PO TABS
325.0000 mg | ORAL_TABLET | Freq: Two times a day (BID) | ORAL | Status: DC
  Administered 2021-10-13 (×2): 325 mg via ORAL
  Filled 2021-10-13 (×2): qty 1

## 2021-10-13 MED ORDER — ROSUVASTATIN CALCIUM 20 MG PO TABS
40.0000 mg | ORAL_TABLET | Freq: Every day | ORAL | Status: DC
  Administered 2021-10-13: 40 mg via ORAL
  Filled 2021-10-13: qty 2

## 2021-10-13 MED ORDER — HYDROCODONE-ACETAMINOPHEN 10-325 MG PO TABS: 1.0000 | ORAL_TABLET | ORAL | Status: DC | PRN

## 2021-10-13 MED ORDER — GABAPENTIN 300 MG PO CAPS
300.0000 mg | ORAL_CAPSULE | Freq: Two times a day (BID) | ORAL | Status: DC
  Administered 2021-10-13 (×2): 300 mg via ORAL
  Filled 2021-10-13 (×2): qty 1

## 2021-10-13 MED ORDER — GABAPENTIN 300 MG PO CAPS: 300.0000 mg | ORAL_CAPSULE | Freq: Two times a day (BID) | ORAL | Status: DC | PRN

## 2021-10-13 MED ORDER — INSULIN GLARGINE-YFGN 100 UNIT/ML ~~LOC~~ SOLN
24.0000 [IU] | Freq: Two times a day (BID) | SUBCUTANEOUS | Status: DC
  Filled 2021-10-13 (×2): qty 0.24

## 2021-10-13 MED ORDER — ALBUTEROL SULFATE (2.5 MG/3ML) 0.083% IN NEBU: 2.5000 mg | INHALATION_SOLUTION | RESPIRATORY_TRACT | Status: DC | PRN

## 2021-10-13 MED ORDER — HYDRALAZINE HCL 20 MG/ML IJ SOLN: 10.0000 mg | Freq: Four times a day (QID) | INTRAMUSCULAR | Status: DC | PRN

## 2021-10-13 MED ORDER — CLOPIDOGREL BISULFATE 75 MG PO TABS
75.0000 mg | ORAL_TABLET | Freq: Every day | ORAL | Status: DC
  Administered 2021-10-13: 75 mg via ORAL
  Filled 2021-10-13: qty 1

## 2021-10-13 MED ORDER — INSULIN ASPART 100 UNIT/ML IJ SOLN: 15.0000 [IU] | Freq: Three times a day (TID) | INTRAMUSCULAR | Status: DC

## 2021-10-13 MED ORDER — PANTOPRAZOLE SODIUM 40 MG PO TBEC
40.0000 mg | DELAYED_RELEASE_TABLET | Freq: Every day | ORAL | Status: DC
  Administered 2021-10-13: 40 mg via ORAL
  Filled 2021-10-13: qty 1

## 2021-10-13 MED ORDER — ACETAMINOPHEN 325 MG PO TABS: 650.0000 mg | ORAL_TABLET | Freq: Four times a day (QID) | ORAL | Status: DC | PRN

## 2021-10-13 MED ORDER — ENOXAPARIN SODIUM 40 MG/0.4ML IJ SOSY
40.0000 mg | PREFILLED_SYRINGE | Freq: Every day | INTRAMUSCULAR | Status: DC
  Filled 2021-10-13: qty 0.4

## 2021-10-13 MED ORDER — ACETAMINOPHEN 650 MG RE SUPP: 650.0000 mg | Freq: Four times a day (QID) | RECTAL | Status: DC | PRN

## 2021-10-13 MED ORDER — NITROGLYCERIN 0.4 MG SL SUBL: 0.4000 mg | SUBLINGUAL_TABLET | SUBLINGUAL | Status: DC | PRN

## 2021-10-13 MED ORDER — METOPROLOL TARTRATE 25 MG PO TABS
37.5000 mg | ORAL_TABLET | Freq: Two times a day (BID) | ORAL | Status: DC
  Administered 2021-10-13 (×2): 37.5 mg via ORAL
  Filled 2021-10-13 (×2): qty 2

## 2021-10-13 MED ORDER — ONDANSETRON HCL 4 MG/2ML IJ SOLN: 4.0000 mg | Freq: Four times a day (QID) | INTRAMUSCULAR | Status: DC | PRN

## 2021-10-13 MED ORDER — INSULIN ASPART 100 UNIT/ML IJ SOLN: 25.0000 [IU] | Freq: Three times a day (TID) | INTRAMUSCULAR | Status: DC

## 2021-10-13 MED ORDER — MONTELUKAST SODIUM 10 MG PO TABS
10.0000 mg | ORAL_TABLET | Freq: Every morning | ORAL | Status: DC
  Administered 2021-10-13: 10 mg via ORAL
  Filled 2021-10-13: qty 1

## 2021-10-13 MED ORDER — ONDANSETRON HCL 4 MG PO TABS: 4.0000 mg | ORAL_TABLET | Freq: Four times a day (QID) | ORAL | Status: DC | PRN

## 2021-10-13 MED ORDER — SODIUM CHLORIDE 0.9% FLUSH
3.0000 mL | Freq: Two times a day (BID) | INTRAVENOUS | Status: DC
Start: 2021-10-13 — End: 2021-10-14
  Administered 2021-10-13: 3 mL via INTRAVENOUS

## 2021-10-13 MED ORDER — FLUTICASONE FUROATE-VILANTEROL 200-25 MCG/ACT IN AEPB
1.0000 | INHALATION_SPRAY | Freq: Every day | RESPIRATORY_TRACT | Status: DC
  Filled 2021-10-13: qty 28

## 2021-10-13 NOTE — Assessment & Plan Note (Signed)
   Continuing home regimen of lipid lowering therapy.  Obtaining lipid panel to ensure LDL is less than 70

## 2021-10-13 NOTE — Assessment & Plan Note (Signed)
No clinical evidence of cardiogenic volume overload Strict input and output monitoring Daily weights Low-sodium diet  

## 2021-10-13 NOTE — Assessment & Plan Note (Signed)
·   Please see assessment and plan above °

## 2021-10-13 NOTE — Assessment & Plan Note (Signed)
.   Continuing home regimen of basal insulin therapy . It seems that patient's prandial insulin regimen is in question, will place patient on a reduced regimen of this as oral intake is currently tenuous . Patient been placed on Accu-Cheks before every meal and nightly with sliding scale insulin . Hemoglobin A1C performed earlier this month at 7.2% . Diabetic Diet

## 2021-10-13 NOTE — Assessment & Plan Note (Signed)
.   Resume patients home regimen of metoprolol . Titrate antihypertensive regimen as necessary to achieve adequate BP control . PRN intravenous antihypertensives for excessively elevated blood pressure  

## 2021-10-13 NOTE — Assessment & Plan Note (Signed)
   Hemoglobin is seemingly stable  No clinical evidence of bleeding  Continuing usual home regimen of iron supplementation

## 2021-10-13 NOTE — H&P (Signed)
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, postoperative atrial fibrillation (04/2020), hypertension, hyperlipidemia, insulin-dependent diabetes mellitus type 2 (HgbA1C 7.2% 10/04/2021), iron deficiency anemia status post numerous blood transfusions thought to be secondary to intermittent GI bleeding status 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  Patient explains that for the past 4 days she has been experiencing waxing and waning chest discomfort.  Patient describes this chest discomfort as midsternal in location, moderate in intensity, aching in quality, nonradiating and seemingly worse with exertion.  Patient complains of associated shortness of breath but denies cough or palpitations.  Patient denies fevers, sick contacts, recent travel.  Patient reports compliance with her home medication regimen.  Due to patient's ongoing symptoms patient eventually contacted EMS who promptly came to evaluate the patient and brought her into [facility] emergency department for evaluation.  Upon evaluation in the emergency department initial troponins were unremarkable.  EKG revealed  no dynamic ST segment change.  Chest x-ray was clear.  However, due to patient's ongoing chest discomfort and extremely high risk coronary disease ER providers requesting hospitalist group evaluate patient for admission to the hospital.  Review of Systems: Review of Systems  Respiratory:  Positive for shortness of breath.   Cardiovascular:  Positive for chest pain.  All other systems reviewed and are negative.    Past Medical History:  Diagnosis Date   Asthma    Bilateral carotid artery stenosis    CAD (coronary artery disease)    s/p CABG x2 in 04/2020   CHF (congestive heart failure) (HCC)    COPD (chronic obstructive pulmonary disease) (HCC)    Diabetes mellitus without complication ([place])    Hypertension    MI (myocardial infarction) ([place])    PAD (peripheral artery disease) ([place])    Sleep apnea     Past Surgical History:  Procedure Laterality Date   AORTIC ROOT ENLARGEMENT N/A 04/03/2020   Procedure: AORTIC ROOT ENLARGEMENT USING HEMASHIELD PLATINUM WOVEN DOUBLE VELOUR VASCULAR GRAFT 28 MM X 30 CM;  Surgeon: Lajuana Matte, MD;  Location: [place];  Service: Open Heart Surgery;  Laterality: N/A;   AORTIC VALVE REPLACEMENT N/A 04/03/2020   Procedure: CORONARY ARTERY BYPASS GRAFTING X 2 ON CARDIOPULMONARY BYPASS. AORTIC VALVE REPLACEMENT USING 21 MM INSPIRIS RESILIA AORTIC VALVE, STERNAL PLATING;  Surgeon: Lajuana Matte, MD;  Location: [place];  Service: Open Heart Surgery;  Laterality: N/A;  Coronary artery bypass grafting  Flow Trac   CORONARY STENT INTERVENTION N/A 09/12/2020   Procedure: CORONARY STENT INTERVENTION;  Surgeon: Jettie Booze, MD;  Location: [place] [facility];  Service: Cardiovascular;  Laterality: N/A;   ENDOVEIN HARVEST OF GREATER SAPHENOUS VEIN Right 04/03/2020   Procedure: ENDOVEIN HARVEST OF GREATER SAPHENOUS VEIN;  Surgeon: Corliss Skains, MD;  Location: [facility] OR;  Service: Open Heart Surgery;  Laterality: Right;   IR RADIOLOGIST EVAL & MGMT   06/07/2021   LEFT HEART CATH AND CORONARY ANGIOGRAPHY N/A 03/30/2020   Procedure: LEFT HEART CATH AND CORONARY ANGIOGRAPHY;  Surgeon: Kathleene Hazel, MD;  Location: [facility] INVASIVE CV LAB;  Service: Cardiovascular;  Laterality: N/A;   RIGHT/LEFT HEART CATH AND CORONARY/GRAFT ANGIOGRAPHY N/A 09/12/2020   Procedure: RIGHT/LEFT HEART CATH AND CORONARY/GRAFT ANGIOGRAPHY;  Surgeon: Corky Crafts, MD;  Location: [facility] INVASIVE CV LAB;  Service: Cardiovascular;  Laterality: N/A;   TEE WITHOUT CARDIOVERSION N/A 04/03/2020   Procedure: TRANSESOPHAGEAL ECHOCARDIOGRAM (TEE);  Surgeon: Corliss Skains, MD;  Location: [facility] OR;  Service: Open Heart Surgery;  Laterality: N/A;    Social History:  reports that she has never smoked. She has never used smokeless tobacco. She reports that she does not currently use alcohol. She reports that she does not currently use drugs.  Allergies  Allergen Reactions   Peanut Oil Swelling and Hives   Povidone-Iodine Other (See Comments)    Unknown reaction  PT STATES SHE HEARD HER DERMATOLOGIST MENTION THEY WERE GLAD THEY DID NOT USE THIS DURING A PROCEDURE, PT STATES SHE IS REALLY NOT SURE WHY.    Shellfish Allergy Other (See Comments)    Unknown reaction   Statins Other (See Comments)    myalgia   Iodine Itching and Rash    History reviewed. No pertinent family history.  Prior to Admission medications   Medication Sig Start Date End Date Taking? Authorizing Provider  albuterol (VENTOLIN HFA) 108 (90 Base) MCG/ACT inhaler Inhale 2 puffs into the lungs every 6 (six) hours as needed for wheezing. 03/09/20  Yes [provider]  Capsaicin-Menthol (SALONPAS GEL-PATCH HOT EX) Apply 1 patch topically 2 (two) times daily. Knee pain   Yes [provider]  cholecalciferol (VITAMIN D3) 25 MCG (1000 UNIT) tablet Take 1,000 Units by mouth 2 (two) times daily.   Yes [provider]  clopidogrel (PLAVIX) 75 MG tablet TAKE 1 TABLET(75 MG) BY MOUTH  DAILY WITH BREAKFAST Patient taking differently: Take 75 mg by mouth daily. 07/31/21  Yes Rollene Rotunda, MD  ferrous sulfate 325 (65 FE) MG tablet Take 325 mg by mouth 2 (two) times daily. 03/24/20  Yes [provider]  Fluticasone-Salmeterol (ADVAIR) 250-50 MCG/DOSE AEPB Inhale 1 puff into the lungs in the morning and at bedtime. 01/27/20  Yes [provider]  gabapentin (NEURONTIN) 300 MG capsule Take 300 mg by mouth See admin instructions. Take one capsule (300 mg) by mouth twice daily - may also take 2 more times during the day as needed for pain 02/01/20  Yes [provider]  HYDROcodone-acetaminophen (NORCO) 10-325 MG tablet Take 1 tablet by mouth every 4 (four) hours as needed (pain).   Yes [provider]  insulin glargine (LANTUS) 100 UNIT/ML Solostar Pen Inject 24 Units into the skin in the morning and at bedtime.   Yes [provider]  insulin lispro (HUMALOG) 100 UNIT/ML injection Inject 50 Units into the skin See admin instructions. Max of 50 units three times daily, pt is unable to provide exact sliding scale   Yes [provider]  metoprolol tartrate (LOPRESSOR) 25 MG tablet TAKE 1 AND 1/2 TABLETS BY MOUTH TWICE DAILY Patient taking differently: Take 37.5 mg by mouth 2 (two) times daily. TAKE 1 AND 1/2 TABLETS BY MOUTH TWICE DAILY 07/30/21  Yes Rollene Rotunda, MD  montelukast (  SINGULAIR) 10 MG tablet Take 10 mg by mouth every morning. 01/05/20  Yes [provider]  pantoprazole (PROTONIX) 40 MG tablet Take 1 tablet (40 mg total) by mouth daily. 06/01/21  Yes Minus Breeding, MD  valACYclovir (VALTREX) 500 MG tablet Take 500 mg by mouth daily as needed (for flare ups).   Yes [provider]  diclofenac Sodium (VOLTAREN) 1 % GEL Apply 2 g topically 2 (two) times daily as needed (joint pain). Patient not taking: Reported on 10/13/2021 06/03/21   Sherrell Puller, PA-C  empagliflozin (JARDIANCE) 10 MG TABS tablet Take 1 tablet (10  mg total) by mouth daily before breakfast. Patient not taking: Reported on 10/13/2021 09/14/20   Barrett, Evelene Croon, PA-C  etodolac (LODINE) 300 MG capsule Take 1 capsule (300 mg total) by mouth every 8 (eight) hours. Patient not taking: Reported on 10/13/2021 04/16/21   Dorie Rank, MD  furosemide (LASIX) 40 MG tablet Take 1 tablet (40 mg total) by mouth daily. Patient not taking: Reported on 10/13/2021 09/03/21   Minus Breeding, MD  nitroGLYCERIN (NITROSTAT) 0.4 MG SL tablet PLACE 1 TABLET UNDER THE TONGUE EVERY 5 MINUTES AS NEEDED FOR CHEST PAIN. Patient taking differently: Place 0.4 mg under the tongue every 5 (five) minutes as needed for chest pain. 11/27/20   Minus Breeding, MD  rosuvastatin (CRESTOR) 40 MG tablet Take 1 tablet (40 mg total) by mouth daily. Patient not taking: Reported on 10/13/2021 06/01/21 08/30/21  Minus Breeding, MD    Physical Exam:  Vitals:   10/12/21 2315 10/12/21 2330 10/12/21 2345 10/13/21 0000  BP: (!) 119/57 114/81  (!) 107/54  Pulse: 79 76 93 78  Resp: (!) 33 16 16   Temp:      TempSrc:      SpO2: 100% 100% 97% 94%  Weight:      Height:        Constitutional: Awake alert and oriented x3, no associated distress.   Skin: no rashes, no lesions, good skin turgor noted. Eyes: Pupils are equally reactive to light.  No evidence of scleral icterus or conjunctival pallor.  ENMT: Moist mucous membranes noted.  Posterior pharynx clear of any exudate or lesions.   Neck: normal, supple, no masses, no thyromegaly.  No evidence of jugular venous distension.   Respiratory: clear to auscultation bilaterally, no wheezing, no crackles. Normal respiratory effort. No accessory muscle use.  Cardiovascular: Regular rate and rhythm, no murmurs / rubs / gallops. No extremity edema. 2+ pedal pulses. No carotid bruits.  Chest:   Nontender without crepitus or deformity.   Back:   Nontender without crepitus or deformity. Abdomen: Abdomen is soft and nontender.  No evidence of  intra-abdominal masses.  Positive bowel sounds noted in all quadrants.   Musculoskeletal: No joint deformity upper and lower extremities. Good ROM, no contractures. Normal muscle tone.  Neurologic: CN 2-12 grossly intact. Sensation intact.  Patient moving all 4 extremities spontaneously.  Patient is following all commands.  Patient is responsive to verbal stimuli.   Psychiatric: Patient exhibits normal mood with appropriate affect.  Patient seems to possess insight as to their current situation.    Data Reviewed:  I have personally reviewed and interpreted labs, imaging.  Significant findings are:  First troponin 13, second troponin 13 Chest x-ray personally reviewed revealing no evidence of acute cardiopulmonary disease CBC revealing white blood cell count of 9.7, hemoglobin 9.4, hematocrit 32.3, platelet count of 209 Chemistry revealing sodium 143, potassium 4.1, chloride 108, bicarbonate 25, BUN 13,  creatinine 0.85 BNP 160.7  EKG: Personally reviewed.  Rhythm is normal sinus rhythm with heart rate of 76 bpm.  No dynamic ST segment changes appreciated.   Assessment and Plan: * Chest pain Patient presenting with chest discomfort with atypical and typical features  Patient has known history of complicated and advanced coronary disease Cycling cardiac enzymes, initial troponin unremarkable EKG reveals no no evidence of dynamic ST segment change Patient currently chest pain-free Considering advanced coronary disease, we will formally consult cardiology in the morning to determine if whether patient warrants noninvasive or invasive ischemic work-up during this hospitalization N.p.o. after midnight in case of need for invasive or noninvasive ischemic work-up Recent echocardiogram in June and therefore this will not be repeated during this hospital stay As needed nitroglycerin for further episodes of chest discomfort Lipid panel in the morning   Coronary artery disease involving coronary  bypass graft of native heart Please see assessment and plan above  Chronic combined systolic and diastolic congestive heart failure (HCC) No clinical evidence of cardiogenic volume overload Strict input and output monitoring Daily weights Low-sodium diet   Type 2 diabetes mellitus with diabetic polyneuropathy, with long-term current use of insulin (HCC) Continuing home regimen of basal insulin therapy It seems that patient's prandial insulin regimen is in question, will place patient on a reduced regimen of this as oral intake is currently tenuous Patient been placed on Accu-Cheks before every meal and nightly with sliding scale insulin Hemoglobin A1C performed earlier this month at 7.2% Diabetic Diet   Essential hypertension Resume patients home regimen of metoprolol Titrate antihypertensive regimen as necessary to achieve adequate BP control PRN intravenous antihypertensives for excessively elevated blood pressure    Asthma with COPD (chronic obstructive pulmonary disease) (HCC) No evidence of asthma/COPD exacerbation this time Continue home regimen of maintenance inhalers As needed bronchodilator therapy for episodic shortness of breath and wheezing.   Mixed diabetic hyperlipidemia associated with type 2 diabetes mellitus (HCC) Continuing home regimen of lipid lowering therapy. Obtaining lipid panel to ensure LDL is less than 70  Iron deficiency anemia due to chronic blood loss Hemoglobin is seemingly stable No clinical evidence of bleeding Continuing usual home regimen of iron supplementation  GERD without esophagitis Continuing home regimen of daily PPI therapy.   OSA treated with BiPAP Continuing home regimen of BiPAP nightly        Code Status:  Full code  code status decision has been confirmed with: patient Family Communication: deferred   Consults: Cardiology to be called in the AM by day team if they deem it is indicated.   Severity of  Illness:  The appropriate patient status for this patient is OBSERVATION. Observation status is judged to be reasonable and necessary in order to provide the required intensity of service to ensure the patient's safety. The patient's presenting symptoms, physical exam findings, and initial radiographic and laboratory data in the context of their medical condition is felt to place them at decreased risk for further clinical deterioration. Furthermore, it is anticipated that the patient will be medically stable for discharge from the hospital within 2 midnights of admission.   Author:  Marinda Elk MD  10/13/2021 2:50 AM

## 2021-10-13 NOTE — Progress Notes (Signed)
  Echocardiogram 2D Echocardiogram has been performed.  Heidi Morse 10/13/2021, 10:51 AM

## 2021-10-13 NOTE — Assessment & Plan Note (Signed)
Continuing home regimen of daily PPI therapy.  

## 2021-10-13 NOTE — Assessment & Plan Note (Signed)
   No evidence of asthma/COPD exacerbation this time  Continue home regimen of maintenance inhalers  As needed bronchodilator therapy for episodic shortness of breath and wheezing.

## 2021-10-13 NOTE — Assessment & Plan Note (Signed)
   Patient presenting with chest discomfort with atypical and typical features   Patient has known history of complicated and advanced coronary disease  Cycling cardiac enzymes, initial troponin unremarkable  EKG reveals no no evidence of dynamic ST segment change  Patient currently chest pain-free  Considering advanced coronary disease, we will formally consult cardiology in the morning to determine if whether patient warrants noninvasive or invasive ischemic work-up during this hospitalization  N.p.o. after midnight in case of need for invasive or noninvasive ischemic work-up  Recent echocardiogram in June and therefore this will not be repeated during this hospital stay  As needed nitroglycerin for further episodes of chest discomfort  Lipid panel in the morning

## 2021-10-13 NOTE — ED Notes (Signed)
Received verbal report from Callie S RN at this time °

## 2021-10-13 NOTE — Plan of Care (Signed)

## 2021-10-13 NOTE — Assessment & Plan Note (Signed)
.   Continuing home regimen of BiPAP nightly

## 2021-10-13 NOTE — Discharge Summary (Signed)
Physician Discharge Summary  Heidi GenerousKaren Poage Morse:[id number]:[id number] DOB: [birth year] DOA: 10/12/2021  PCP: Heidi EndowJohn, [name], MD  Admit date: 10/12/2021 Discharge date: 10/13/2021  Admitted From: Home Disposition:  Home  Discharge Condition:Stable CODE STATUS:FULL, DNR, Comfort Care Diet recommendation: Heart Healthy / Carb Modified / Regular / Dysphagia   Brief/Interim Summary: 66 year old female with past medical history of coronary artery disease (S/P CABG 04/2020, last cath 08/2020 multivessel disease S/P DES to ostial vein graft), ischemic cardiomyopathy with systolic and diastolic congestive heart failure (Echo 08/2021 EF 40-45% with G2DD), 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, postoperative atrial fibrillation (04/2020), hypertension, hyperlipidemia, insulin-dependent diabetes mellitus type 2 (HgbA1C 7.2% 10/04/2021), iron deficiency anemia status post numerous blood transfusions thought to be secondary to intermittent GI bleeding status post 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  Hospital course:  Patient's hospital course remained stable.  Her chest pain resolved during my evaluation this morning.  Her troponins were essentially normal.  EKG did not show any significant ischemic changes.  Patient wanted to go home.  I offered echocardiogram.  Echo shows stable changes from last echo with EF of 40 to 45%, no new wall motion abnormality. Patient will be discharged to home today.  I recommend her to follow-up with her cardiologist and PCP as an outpatient  Discharge Diagnoses:  Principal Problem:   Chest pain Active Problems:   Coronary artery disease involving coronary bypass graft of native heart   Chronic  combined systolic and diastolic congestive heart failure (HCC)   Type 2 diabetes mellitus with diabetic polyneuropathy, with long-term current use of insulin (HCC)   Essential hypertension   Asthma with COPD (chronic obstructive pulmonary disease) (HCC)   Mixed diabetic hyperlipidemia associated with type 2 diabetes mellitus (HCC)   Iron deficiency anemia due to chronic blood loss   GERD without esophagitis   OSA treated with BiPAP    Discharge Instructions  Discharge Instructions     Diet - low sodium heart healthy   Complete by: As directed    Discharge instructions   Complete by: As directed    1)Please continue your home medications 2)Follow up with your PCP in a week   Increase activity slowly   Complete by: As directed       Allergies as of 10/13/2021       Reactions   Peanut Oil Swelling, Hives   Povidone-iodine Other (See Comments)   Unknown reaction  PT STATES SHE HEARD HER DERMATOLOGIST MENTION THEY WERE GLAD THEY DID NOT USE THIS DURING A PROCEDURE, PT STATES SHE IS REALLY NOT SURE WHY.   Shellfish Allergy Other (See Comments)   Unknown reaction   Statins Other (See Comments)   myalgia   Iodine Itching, Rash        Medication List     TAKE these medications    albuterol 108 (90 Base) MCG/ACT inhaler Commonly known as: VENTOLIN HFA Inhale 2 puffs into the lungs every 6 (six) hours as needed for wheezing.   cholecalciferol 25 MCG (1000 UNIT) tablet Commonly known as: VITAMIN D3 Take 1,000 Units by mouth 2 (two) times daily.   clopidogrel 75 MG tablet Commonly known as: PLAVIX TAKE 1 TABLET(75 MG) BY MOUTH DAILY WITH BREAKFAST What changed: See the new instructions.  diclofenac Sodium 1 % Gel Commonly known as: VOLTAREN Apply 2 g topically 2 (two) times daily as needed (joint pain).   empagliflozin 10 MG Tabs tablet Commonly known as: Jardiance Take 1 tablet (10 mg total) by mouth daily before breakfast.   etodolac 300 MG capsule Commonly  known as: LODINE Take 1 capsule (300 mg total) by mouth every 8 (eight) hours.   ferrous sulfate 325 (65 FE) MG tablet Take 325 mg by mouth 2 (two) times daily.   Fluticasone-Salmeterol 250-50 MCG/DOSE Aepb Commonly known as: ADVAIR Inhale 1 puff into the lungs in the morning and at bedtime.   furosemide 40 MG tablet Commonly known as: LASIX Take 1 tablet (40 mg total) by mouth daily.   gabapentin 300 MG capsule Commonly known as: NEURONTIN Take 300 mg by mouth See admin instructions. Take one capsule (300 mg) by mouth twice daily - may also take 2 more times during the day as needed for pain   HumaLOG 100 UNIT/ML injection Generic drug: insulin lispro Inject 50 Units into the skin See admin instructions. Max of 50 units three times daily, pt is unable to provide exact sliding scale   HYDROcodone-acetaminophen 10-325 MG tablet Commonly known as: NORCO Take 1 tablet by mouth every 4 (four) hours as needed (pain).   insulin glargine 100 UNIT/ML Solostar Pen Commonly known as: LANTUS Inject 24 Units into the skin in the morning and at bedtime.   metoprolol tartrate 25 MG tablet Commonly known as: LOPRESSOR TAKE 1 AND 1/2 TABLETS BY MOUTH TWICE DAILY What changed: additional instructions   montelukast 10 MG tablet Commonly known as: SINGULAIR Take 10 mg by mouth every morning.   nitroGLYCERIN 0.4 MG SL tablet Commonly known as: NITROSTAT PLACE 1 TABLET UNDER THE TONGUE EVERY 5 MINUTES AS NEEDED FOR CHEST PAIN. What changed: See the new instructions.   pantoprazole 40 MG tablet Commonly known as: Protonix Take 1 tablet (40 mg total) by mouth daily.   rosuvastatin 40 MG tablet Commonly known as: CRESTOR Take 1 tablet (40 mg total) by mouth daily.   SALONPAS GEL-PATCH HOT EX Apply 1 patch topically 2 (two) times daily. Knee pain   valACYclovir 500 MG tablet Commonly known as: VALTREX Take 500 mg by mouth daily as needed (for flare ups).        Follow-up  Information     Heidi Endow, MD. Schedule an appointment as soon as possible for a visit in 1 week(s).   Specialty: Internal Medicine Contact information: [address] [place] [address] [phone]                Allergies  Allergen Reactions   Peanut Oil Swelling and Hives   Povidone-Iodine Other (See Comments)    Unknown reaction  PT STATES SHE HEARD HER DERMATOLOGIST MENTION THEY WERE GLAD THEY DID NOT USE THIS DURING A PROCEDURE, PT STATES SHE IS REALLY NOT SURE WHY.    Shellfish Allergy Other (See Comments)    Unknown reaction   Statins Other (See Comments)    myalgia   Iodine Itching and Rash    Consultations: None  Procedures/Studies: ECHOCARDIOGRAM COMPLETE  Result Date: 10/13/2021    ECHOCARDIOGRAM REPORT   Patient Name:   Heidi Morse Date of Exam: 10/13/2021 Medical Rec #:  [id number]      Height:       60.0 in Accession #:    [id number]     Weight:  262.3 lb Date of Birth:  November 22, 1955      BSA:          2.095 m Patient Age:    65 years       BP:           124/71 mmHg Patient Gender: F              HR:           72 bpm. Exam Location:  Inpatient Procedure: 2D Echo Indications:    Chest pain  History:        Patient has prior history of Echocardiogram examinations, most                 recent 09/21/2021. CHF, CAD, COPD; Risk Factors:Hypertension,                 Dyslipidemia and Diabetes.                 Aortic Valve: valve is present in the aortic position. Procedure                 Date: 04/2020.  Sonographer:    Delcie Roch RDCS Referring Phys: [phone] Guido Comp  Sonographer Comments: Patient is morbidly obese. Image acquisition challenging due to patient body habitus. IMPRESSIONS  1. Left ventricular ejection fraction, by estimation, is 40 to 45%. The left ventricle has mildly decreased function. The left ventricle demonstrates global hypokinesis. There is moderate left ventricular hypertrophy. Left ventricular diastolic  parameters are consistent with Grade II diastolic dysfunction (pseudonormalization). Elevated left atrial pressure.  2. Right ventricular systolic function is low normal. The right ventricular size is normal. There is normal pulmonary artery systolic pressure.  3. Left atrial size was mildly dilated.  4. The mitral valve is abnormal. Moderate mitral valve regurgitation. No evidence of mitral stenosis.  5. The tricuspid valve is abnormal. Tricuspid valve regurgitation is moderate.  6. 21 MM INSPIRIS RESILIA AORTIC VALVE is in the AV position. Stable mean gradient 20 mmHg. . The aortic valve has been repaired/replaced. There is mild calcification of the aortic valve. There is mild thickening of the aortic valve. Aortic valve regurgitation is not visualized. There is a valve present in the aortic position. Procedure Date: 04/2020.  7. The inferior vena cava is normal in size with greater than 50% respiratory variability, suggesting right atrial pressure of 3 mmHg. FINDINGS  Left Ventricle: Left ventricular ejection fraction, by estimation, is 40 to 45%. The left ventricle has mildly decreased function. The left ventricle demonstrates global hypokinesis. The left ventricular internal cavity size was normal in size. There is  moderate left ventricular hypertrophy. Left ventricular diastolic parameters are consistent with Grade II diastolic dysfunction (pseudonormalization). Elevated left atrial pressure. Right Ventricle: The right ventricular size is normal. Right vetricular wall thickness was not well visualized. Right ventricular systolic function is low normal. There is normal pulmonary artery systolic pressure. The tricuspid regurgitant velocity is 2.83 m/s, and with an assumed right atrial pressure of 3 mmHg, the estimated right ventricular systolic pressure is 35.0 mmHg. Left Atrium: Left atrial size was mildly dilated. Right Atrium: Right atrial size was normal in size. Pericardium: There is no evidence of  pericardial effusion. Mitral Valve: The mitral valve is abnormal. There is mild thickening of the mitral valve leaflet(s). There is mild calcification of the mitral valve leaflet(s). Mild mitral annular calcification. Moderate mitral valve regurgitation. No evidence of mitral  valve stenosis. Tricuspid Valve: The tricuspid valve is  abnormal. Tricuspid valve regurgitation is moderate . No evidence of tricuspid stenosis. Aortic Valve: 21 MM INSPIRIS RESILIA AORTIC VALVE is in the AV position. Stable mean gradient 20 mmHg. The aortic valve has been repaired/replaced. There is mild calcification of the aortic valve. There is mild thickening of the aortic valve. There is mild aortic valve annular calcification. Aortic valve regurgitation is not visualized. Aortic valve mean gradient measures 20.0 mmHg. Aortic valve peak gradient measures 36.2 mmHg. There is a valve present in the aortic position. Procedure Date: 04/2020. Pulmonic Valve: The pulmonic valve was not well visualized. Pulmonic valve regurgitation is mild to moderate. No evidence of pulmonic stenosis. Aorta: The aortic root is normal in size and structure. Venous: The inferior vena cava is normal in size with greater than 50% respiratory variability, suggesting right atrial pressure of 3 mmHg. IAS/Shunts: The interatrial septum was not well visualized.  LEFT VENTRICLE PLAX 2D LVIDd:         5.40 cm      Diastology LVIDs:         4.40 cm      LV e' medial:    6.20 cm/s LV PW:         1.40 cm      LV E/e' medial:  26.9 LV IVS:        1.20 cm      LV e' lateral:   6.20 cm/s                             LV E/e' lateral: 26.9  LV Volumes (MOD) LV vol d, MOD A4C: 135.0 ml LV vol s, MOD A4C: 71.4 ml LV SV MOD A4C:     135.0 ml RIGHT VENTRICLE            IVC RV S prime:     7.29 cm/s  IVC diam: 1.80 cm TAPSE (M-mode): 1.5 cm LEFT ATRIUM             Index        RIGHT ATRIUM           Index LA diam:        4.60 cm 2.20 cm/m   RA Area:     16.60 cm LA Vol (A2C):   79.2  ml 37.81 ml/m  RA Volume:   41.00 ml  19.57 ml/m LA Vol (A4C):   64.3 ml 30.70 ml/m LA Biplane Vol: 71.7 ml 34.23 ml/m  AORTIC VALVE                    PULMONIC VALVE AV Vmax:           301.00 cm/s  PR End Diast Vel: 9.61 msec AV Vmean:          205.000 cm/s AV VTI:            0.683 m AV Peak Grad:      36.2 mmHg AV Mean Grad:      20.0 mmHg LVOT Vmax:         100.05 cm/s LVOT Vmean:        69.600 cm/s LVOT VTI:          0.223 m LVOT/AV VTI ratio: 0.33  AORTA Ao Root diam: 2.90 cm Ao Asc diam:  3.10 cm MITRAL VALVE                  TRICUSPID VALVE MV Area (PHT): 2.56  cm       TR Peak grad:   32.0 mmHg MV Decel Time: 296 msec       TR Vmax:        283.00 cm/s MR Peak grad:    104.9 mmHg MR Mean grad:    64.0 mmHg    SHUNTS MR Vmax:         512.00 cm/s  Systemic VTI: 0.22 m MR Vmean:        371.0 cm/s MR PISA:         0.57 cm MR PISA Eff ROA: 4 mm MR PISA Radius:  0.30 cm MV E velocity: 167.00 cm/s MV A velocity: 72.60 cm/s MV E/A ratio:  2.30 Dina Rich MD Electronically signed by Dina Rich MD Signature Date/Time: 10/13/2021/12:22:06 PM    Final    DG Chest Port 1 View  Result Date: 10/12/2021 CLINICAL DATA:  Chest pain and shortness of breath for several days. EXAM: PORTABLE CHEST 1 VIEW COMPARISON:  09/24/2021 FINDINGS: Moderate cardiomegaly is stable. Prior CABG and aortic valve replacement noted. Mild scarring again seen in the left midlung. The lungs are otherwise clear. IMPRESSION: Stable cardiomegaly. No active lung disease. Electronically Signed   By: Danae Orleans M.D.   On: 10/12/2021 17:04   ECHOCARDIOGRAM COMPLETE  Result Date: 09/21/2021    ECHOCARDIOGRAM REPORT   Patient Name:   Heidi Morse Date of Exam: 09/21/2021 Medical Rec #:  [id number]      Height:       60.0 in Accession #:    [id number]     Weight:       263.6 lb Date of Birth:  1955-12-17      BSA:          2.099 m Patient Age:    65 years       BP:           136/90 mmHg Patient Gender: F              HR:           93  bpm. Exam Location:  [address] Procedure: 2D Echo, Cardiac Doppler, Color Doppler, Strain Analysis and 3D Echo Indications:    R06.02 SOB  History:        Patient has prior history of Echocardiogram examinations, most                 recent 08/14/2020. CHF, CAD and Previous Myocardial Infarction,                 Prior CABG, COPD, AVR (4mm Inspiris bioprosthesis),                 Signs/Symptoms:Shortness of Breath; Risk Factors:Morbid obesity,                 Hypertension, Diabetes and Dyslipidemia.                 Aortic Valve: 21 mm bioprosthetic valve is present in the aortic                 position. Procedure Date: 04/03/20.  Sonographer:    Samule Ohm RDCS Referring Phys: [address]  Sonographer Comments: Technically difficult study due to poor echo windows and patient is morbidly obese. Image acquisition challenging due to patient body habitus and Image acquisition challenging due to COPD. IMPRESSIONS  1. LV dilation has occurred since last echo (LVEDD 4.5 -> 5.5, LVESD 3.3 -> 4.4). Left ventricular ejection  fraction, by estimation, is 40 to 45%. The left ventricle has mildly decreased function. The left ventricle demonstrates global hypokinesis. The left ventricular internal cavity size was mildly dilated. There is mild concentric left ventricular hypertrophy. Left ventricular diastolic parameters are consistent with Grade II diastolic dysfunction (pseudonormalization). The average left ventricular global longitudinal strain is -8.0 %. The global longitudinal strain is abnormal.  2. Right ventricular systolic function is mildly reduced. The right ventricular size is normal. There is mildly elevated pulmonary artery systolic pressure.  3. Left atrial size was moderately dilated.  4. The mitral valve is grossly normal. Mild to moderate mitral valve regurgitation. No evidence of mitral stenosis.  5. The aortic valve has been repaired/replaced. Aortic valve regurgitation is trivial. There is a 21  mm bioprosthetic valve present in the aortic position. Procedure Date: 04/03/20. Echo findings are consistent with perivalvular leak of the aortic prosthesis. Aortic valve area, by VTI measures 0.70 cm. Aortic valve mean gradient measures 18.5 mmHg. Aortic valve Vmax measures 2.96 m/s.  6. Aortic S/P aortic root dilation, stable appearance.  7. The inferior vena cava is normal in size with greater than 50% respiratory variability, suggesting right atrial pressure of 3 mmHg. Comparison(s): Prior images reviewed side by side. Changes from prior study are noted. Conclusion(s)/Recommendation(s): S/P AVR, with patient-prosthesis mismatch by gradients (stable) and AVA/DI (worse on current study). LV has also dilated compared to prior study, with resultant reduction in EF to 40-45%. FINDINGS  Left Ventricle: LV dilation has occurred since last echo (LVEDD 4.5 -> 5.5, LVESD 3.3 -> 4.4). Left ventricular ejection fraction, by estimation, is 40 to 45%. The left ventricle has mildly decreased function. The left ventricle demonstrates global hypokinesis. The average left ventricular global longitudinal strain is -8.0 %. The global longitudinal strain is abnormal. 3D left ventricular ejection fraction analysis performed but not reported based on interpreter judgement due to suboptimal tracking. The left ventricular internal cavity size was mildly dilated. There is mild concentric left ventricular hypertrophy. Left ventricular diastolic parameters are consistent with Grade II diastolic dysfunction (pseudonormalization). Right Ventricle: The right ventricular size is normal. Right vetricular wall thickness was not well visualized. Right ventricular systolic function is mildly reduced. There is mildly elevated pulmonary artery systolic pressure. The tricuspid regurgitant velocity is 3.14 m/s, and with an assumed right atrial pressure of 3 mmHg, the estimated right ventricular systolic pressure is 42.4 mmHg. Left Atrium: Left atrial  size was moderately dilated. Right Atrium: Right atrial size was normal in size. Pericardium: There is no evidence of pericardial effusion. Mitral Valve: The mitral valve is grossly normal. There is mild thickening of the mitral valve leaflet(s). There is mild calcification of the mitral valve leaflet(s). Mild to moderate mitral valve regurgitation. No evidence of mitral valve stenosis. Tricuspid Valve: The tricuspid valve is normal in structure. Tricuspid valve regurgitation is mild . No evidence of tricuspid stenosis. Aortic Valve: The aortic valve has been repaired/replaced. Aortic valve regurgitation is trivial. Aortic regurgitation PHT measures 385 msec. Aortic valve mean gradient measures 18.5 mmHg. Aortic valve peak gradient measures 35.0 mmHg. Aortic valve area,  by VTI measures 0.70 cm. There is a 21 mm bioprosthetic valve present in the aortic position. Procedure Date: 04/03/20. Pulmonic Valve: The pulmonic valve was not well visualized. Pulmonic valve regurgitation is mild to moderate. No evidence of pulmonic stenosis. Aorta: S/P aortic root dilation, stable appearance. Venous: The inferior vena cava is normal in size with greater than 50% respiratory variability, suggesting right atrial pressure of  3 mmHg. IAS/Shunts: The atrial septum is grossly normal.  LEFT VENTRICLE PLAX 2D LVIDd:         5.50 cm   Diastology LVIDs:         4.40 cm   LV e' medial:    9.68 cm/s LV PW:         1.20 cm   LV E/e' medial:  16.5 LV IVS:        1.30 cm   LV e' lateral:   9.03 cm/s LVOT diam:     1.80 cm   LV E/e' lateral: 17.7 LV SV:         45 LV SV Index:   21        2D Longitudinal Strain LVOT Area:     2.54 cm  2D Strain GLS (A2C):   -8.5 %                          2D Strain GLS (A3C):   -7.7 %                          2D Strain GLS (A4C):   -7.8 %                          2D Strain GLS Avg:     -8.0 %                           3D Volume EF:                          3D EF:        40 %                          LV EDV:        226 ml                          LV ESV:       137 ml                          LV SV:        90 ml RIGHT VENTRICLE            IVC RV S prime:     6.74 cm/s  IVC diam: 1.50 cm TAPSE (M-mode): 1.3 cm RVSP:           42.4 mmHg LEFT ATRIUM             Index        RIGHT ATRIUM           Index LA diam:        4.80 cm 2.29 cm/m   RA Pressure: 3.00 mmHg LA Vol (A2C):   79.4 ml 37.83 ml/m  RA Area:     13.70 cm LA Vol (A4C):   83.5 ml 39.78 ml/m  RA Volume:   32.20 ml  15.34 ml/m LA Biplane Vol: 83.4 ml 39.73 ml/m  AORTIC VALVE AV Area (Vmax):    0.62 cm AV Area (Vmean):   0.65 cm AV Area (VTI):     0.70 cm AV Vmax:  296.00 cm/s AV Vmean:          199.500 cm/s AV VTI:            0.643 m AV Peak Grad:      35.0 mmHg AV Mean Grad:      18.5 mmHg LVOT Vmax:         72.50 cm/s LVOT Vmean:        50.900 cm/s LVOT VTI:          0.176 m LVOT/AV VTI ratio: 0.27 AI PHT:            385 msec  AORTA Ao Root diam: 2.90 cm Ao Asc diam:  3.30 cm MITRAL VALVE                TRICUSPID VALVE MV Area (PHT): 3.31 cm     TR Peak grad:   39.4 mmHg MV Decel Time: 229 msec     TR Vmax:        314.00 cm/s MV E velocity: 160.00 cm/s  Estimated RAP:  3.00 mmHg MV A velocity: 117.00 cm/s  RVSP:           42.4 mmHg MV E/A ratio:  1.37                             SHUNTS                             Systemic VTI:  0.18 m                             Systemic Diam: 1.80 cm Jodelle Red MD Electronically signed by Jodelle Red MD Signature Date/Time: 09/21/2021/6:32:39 PM    Final       Subjective: Patient seen and examined at the bedside this morning.  Hemodynamically stable for discharge today.  Discharge Exam: Vitals:   10/13/21 1115 10/13/21 1220  BP:  (!) 99/50  Pulse:  71  Resp:  (!) 21  Temp: 98.2 F (36.8 C) 98 F (36.7 C)  SpO2:  100%   Vitals:   10/13/21 0800 10/13/21 1100 10/13/21 1115 10/13/21 1220  BP: 124/71 129/64  (!) 99/50  Pulse: 73 70  71  Resp: 18 (!) 22  (!) 21  Temp:   98.2 F  (36.8 C) 98 F (36.7 C)  TempSrc:   Oral Oral  SpO2: 96% 97%  100%  Weight:      Height:        General: Pt is alert, awake, not in acute distress Cardiovascular: RRR, S1/S2 +, no rubs, no gallops Respiratory: CTA bilaterally, no wheezing, no rhonchi Abdominal: Soft, NT, ND, bowel sounds + Extremities: no edema, no cyanosis    The results of significant diagnostics from this hospitalization (including imaging, microbiology, ancillary and laboratory) are listed below for reference.     Microbiology: No results found for this or any previous visit (from the past 240 hour(s)).   Labs: BNP (last 3 results) Recent Labs    04/16/21 1138 08/14/21 1036 10/12/21 1735  BNP 101.4* 206.6* 160.7*   Basic Metabolic Panel: Recent Labs  Lab 10/12/21 1735 10/13/21 0254  NA 143 143  K 4.1 3.7  CL 108 106  CO2 25 25  GLUCOSE 89 180*  BUN 13 15  CREATININE 0.85 0.75  CALCIUM 9.1 8.6*  MG  --  1.8   Liver Function Tests: Recent Labs  Lab 10/13/21 0254  AST 15  ALT 15  ALKPHOS 62  BILITOT 0.6  PROT 5.8*  ALBUMIN 2.8*   No results for input(s): "LIPASE", "AMYLASE" in the last 168 hours. No results for input(s): "AMMONIA" in the last 168 hours. CBC: Recent Labs  Lab 10/12/21 1735 10/13/21 0254  WBC 9.7 8.5  NEUTROABS 6.7 6.2  HGB 9.4* 8.5*  HCT 32.3* 28.1*  MCV 85.4 81.9  PLT 209 176   Cardiac Enzymes: No results for input(s): "CKTOTAL", "CKMB", "CKMBINDEX", "TROPONINI" in the last 168 hours. BNP: Invalid input(s): "POCBNP" CBG: Recent Labs  Lab 10/13/21 0852 10/13/21 1218  GLUCAP 125* 179*   D-Dimer No results for input(s): "DDIMER" in the last 72 hours. Hgb A1c Recent Labs    10/13/21 0254  HGBA1C 6.8*   Lipid Profile Recent Labs    10/13/21 0254  CHOL 189  HDL 40*  LDLCALC 128*  TRIG 103  CHOLHDL 4.7   Thyroid function studies No results for input(s): "TSH", "T4TOTAL", "T3FREE", "THYROIDAB" in the last 72 hours.  Invalid input(s):  "FREET3" Anemia work up No results for input(s): "VITAMINB12", "FOLATE", "FERRITIN", "TIBC", "IRON", "RETICCTPCT" in the last 72 hours. Urinalysis    Component Value Date/Time   COLORURINE YELLOW 01/28/2021 2026   APPEARANCEUR CLEAR 01/28/2021 2026   LABSPEC 1.019 01/28/2021 2026   PHURINE 5.0 01/28/2021 2026   GLUCOSEU NEGATIVE 01/28/2021 2026   HGBUR NEGATIVE 01/28/2021 2026   BILIRUBINUR NEGATIVE 01/28/2021 2026   KETONESUR NEGATIVE 01/28/2021 2026   PROTEINUR NEGATIVE 01/28/2021 2026   NITRITE NEGATIVE 01/28/2021 2026   LEUKOCYTESUR NEGATIVE 01/28/2021 2026   Sepsis Labs Recent Labs  Lab 10/12/21 1735 10/13/21 0254  WBC 9.7 8.5   Microbiology No results found for this or any previous visit (from the past 240 hour(s)).  Please note: You were cared for by a hospitalist during your hospital stay. Once you are discharged, your primary care physician will handle any further medical issues. Please note that NO REFILLS for any discharge medications will be authorized once you are discharged, as it is imperative that you return to your primary care physician (or establish a relationship with a primary care physician if you do not have one) for your post hospital discharge needs so that they can reassess your need for medications and monitor your lab values.    Time coordinating discharge: 40 minutes  SIGNED:   Burnadette Pop, MD  [facility] 10/13/2021, 1:50 PM Pager [phone]  If 7PM-7AM, please contact night-coverage [email or web address] Password [email or web address]

## 2022-01-04 ENCOUNTER — Encounter: Payer: Self-pay | Admitting: Physician Assistant

## 2022-01-04 ENCOUNTER — Ambulatory Visit: Payer: Medicare Other | Attending: Cardiovascular Disease | Admitting: Physician Assistant

## 2022-01-04 VITALS — BP 140/84 | HR 74 | Ht 60.0 in | Wt 244.6 lb

## 2022-01-04 DIAGNOSIS — I480 Paroxysmal atrial fibrillation: Secondary | ICD-10-CM

## 2022-01-04 DIAGNOSIS — I10 Essential (primary) hypertension: Secondary | ICD-10-CM

## 2022-01-04 DIAGNOSIS — E119 Type 2 diabetes mellitus without complications: Secondary | ICD-10-CM

## 2022-01-04 DIAGNOSIS — E785 Hyperlipidemia, unspecified: Secondary | ICD-10-CM

## 2022-01-04 DIAGNOSIS — Z952 Presence of prosthetic heart valve: Secondary | ICD-10-CM

## 2022-01-04 DIAGNOSIS — I5022 Chronic systolic (congestive) heart failure: Secondary | ICD-10-CM | POA: Diagnosis not present

## 2022-01-04 DIAGNOSIS — I25810 Atherosclerosis of coronary artery bypass graft(s) without angina pectoris: Secondary | ICD-10-CM

## 2022-01-04 NOTE — Progress Notes (Signed)
Cardiology Office Note:    Date:  01/06/2022   ID:  Heidi Morse, DOB 02/08/1956, MRN [id number]  PCP:  Cathey EndowJohn, [name], MD   [facility] Providers Cardiologist:  Rollene RotundaJames Hochrein, MD     Referring MD: Greer Eeook, James David Jr., *   Chief Complaint  Patient presents with   Follow-up    Seen for Dr. Antoine PocheHochrein    History of Present Illness:    Heidi Morse is a 66 y.o. female with a hx of CAD s/p CABG, aortic stenosis s/p AVR, postop atrial fibrillation, ischemic cardiomyopathy, PAD s/p left common femoral artery stenting 11/2017, bilateral carotid artery disease s/p stenting of left ICA, COPD, hypertension, hyperlipidemia, DM2 and obstructive sleep apnea.  Endoscopy performed in December 2021 showed bleeding AVMs.  Patient was subsequently admitted to [facility] for anemia and GI bleed.  Patient was seen at [facility] ED in April 2021 due to persistent chest pain unrelieved by nitroglycerin.  Work-up was negative.  She was felt to have noncardiac type of chest pain.  She was admitted in December 2021 for chest pain again, troponin was negative.  Hemoglobin was 7.6 and she was transfused with 1 unit of packed red blood cell.  GI did not recommend any intervention.  She underwent a left heart cath that showed patent left main stent with distal moderate restenosis, haziness noted in the ostial LAD suggestive of moderate restenosis of the stent, severe restenosis in ostial left circumflex stent.  Echocardiogram showed EF 45 to 50%, mild LVH, grade 2 DD, mild to moderate AI, moderate aortic stenosis and moderate MR.  Patient was seen by CT surgery and eventually underwent CABG x2 with LIMA-LAD and SVG-OM 1 as well as AVR in January 2022 by Dr. Cliffton AstersLightfoot.  Postop course complicated by atrial fibrillation with RVR.  She was unable to tolerate amiodarone due to iodine allergy.  She was given digoxin and beta-blocker.  Patient subsequently converted back to sinus rhythm therefore did not start on  anticoagulation therapy.  Due to increased dyspnea, she underwent repeat cath in 2022 and a high-grade restenosis in SVG for which she underwent angioplasty and a improvement in her symptoms.  She underwent [facility] September 20[facility] which revealed 3 bleeding area in her duodenum that was treated with argon plasma coagulation and a single bleeding area in the jejunum treated with APC.  She was followed by hematology/oncology Dr. Allison Qu[facility]ryHarish [facility] and started on oral iron therapy.  Patient was last seen by Dr. Antoine PocheHochrein in June 2023 for acute on chronic heart failure.  She received IV Lasix and sent home.  Unfortunately she was not taking her Lasix after she got home.  Her Lasix was increased to 40 mg daily.  Echocardiogram obtained on 09/21/2021 showed LV dilatation that has occurred since the previous echo, EF 40 to 45% grade 2 DD, mild to moderate MR, moderate LAE, 21 mm bioprosthetic aortic valve present in the aortic position.  Since the last visit, patient was admitted at [facility] ED on 09/24/2021 for shortness of breath.  She was found to have COPD exacerbation with acute on chronic hypoxic respiratory failure.  She wears 2 L nasal cannula at home.  Chest x-ray showed cardiomegaly with central venous congestion.  A CTA of the chest showed bilateral lower lobe atelectasis and a small pleural effusion.  BNP 267.  Troponin negative.  Hemoglobin 9.6.  She was treated with 5-day course of prednisone and 3 days  course of azithromycin.  She was readmitted to the hospital in July 2023 with chest pain.  EKG was normal.  Echocardiogram was repeated which showed EF 40 to 45%, no new wall motion abnormality.  Patient went back to [facility] ED on 10/29/2021 with shortness of breath.  At the time, she reported by lateral lower extremity swelling.  Venous Doppler was negative for DVT.  Symptom improved with IV Lasix.  Patient presents today for follow-up.  She appears to be euvolemic on exam.  She does  have occasional throat burning sensation when she sit down at night however this does not occur with physical activity nor does it feel like her previous angina.  Overall, she seems to be doing quite well from the cardiac perspective.  Her EF on the last 2 echocardiogram was low at 40 to 45%.  She is concerned about increased urinary frequency however denies any burning sensation when she does urinate.  I recommend a basic metabolic panel, if normal, she may need to see her PCP for urinary frequency and urgency.    Past Medical History:  Diagnosis Date   Asthma    Bilateral carotid artery stenosis    CAD (coronary artery disease)    s/p CABG x2 in 04/2020   CHF (congestive heart failure) (HCC)    COPD (chronic obstructive pulmonary disease) (HCC)    Diabetes mellitus without complication (HCC)    Hypertension    MI (myocardial infarction) (HCC)    PAD (peripheral artery disease) (HCC)    Sleep apnea     Past Surgical History:  Procedure Laterality Date   AORTIC ROOT ENLARGEMENT N/A 04/03/2020   Procedure: AORTIC ROOT ENLARGEMENT USING HEMASHIELD PLATINUM WOVEN DOUBLE VELOUR VASCULAR GRAFT 28 MM X 30 CM;  Surgeon: Corliss Skains, MD;  Location: [facility] OR;  Service: Open Heart Surgery;  Laterality: N/A;   AORTIC VALVE REPLACEMENT N/A 04/03/2020   Procedure: CORONARY ARTERY BYPASS GRAFTING X 2 ON CARDIOPULMONARY BYPASS. AORTIC VALVE REPLACEMENT USING 21 MM INSPIRIS RESILIA AORTIC VALVE, STERNAL PLATING;  Surgeon: Corliss Skains, MD;  Location: [facility] OR;  Service: Open Heart Surgery;  Laterality: N/A;  Coronary artery bypass grafting  Flow Trac   CORONARY STENT INTERVENTION N/A 09/12/2020   Procedure: CORONARY STENT INTERVENTION;  Surgeon: Corky Crafts, MD;  Location: [facility] INVASIVE CV LAB;  Service: Cardiovascular;  Laterality: N/A;   ENDOVEIN HARVEST OF GREATER SAPHENOUS VEIN Right 04/03/2020   Procedure: ENDOVEIN HARVEST OF GREATER SAPHENOUS VEIN;  Surgeon: Corliss Skains, MD;   Location: [facility] OR;  Service: Open Heart Surgery;  Laterality: Right;   IR RADIOLOGIST EVAL & MGMT  06/07/2021   LEFT HEART CATH AND CORONARY ANGIOGRAPHY N/A 03/30/2020   Procedure: LEFT HEART CATH AND CORONARY ANGIOGRAPHY;  Surgeon: Kathleene Hazel, MD;  Location: [facility] INVASIVE CV LAB;  Service: Cardiovascular;  Laterality: N/A;   RIGHT/LEFT HEART CATH AND CORONARY/GRAFT ANGIOGRAPHY N/A 09/12/2020   Procedure: RIGHT/LEFT HEART CATH AND CORONARY/GRAFT ANGIOGRAPHY;  Surgeon: Corky Crafts, MD;  Location: [facility] INVASIVE CV LAB;  Service: Cardiovascular;  Laterality: N/A;   TEE WITHOUT CARDIOVERSION N/A 04/03/2020   Procedure: TRANSESOPHAGEAL ECHOCARDIOGRAM (TEE);  Surgeon: Corliss Skains, MD;  Location: [name] OR;  Service: Open Heart Surgery;  Laterality: N/A;    Current Medications: Current Meds  Medication Sig   albuterol (VENTOLIN HFA) 108 (90 Base) MCG/ACT inhaler Inhale 2 puffs into the lungs every 6 (six) hours as needed for wheezing.   Capsaicin-Menthol (SALONPAS GEL-PATCH HOT  EX) Apply 1 patch topically 2 (two) times daily. Knee pain   cholecalciferol (VITAMIN D3) 25 MCG (1000 UNIT) tablet Take 1,000 Units by mouth 2 (two) times daily.   clopidogrel (PLAVIX) 75 MG tablet TAKE 1 TABLET(75 MG) BY MOUTH DAILY WITH BREAKFAST   empagliflozin (JARDIANCE) 10 MG TABS tablet Take 1 tablet (10 mg total) by mouth daily before breakfast.   ferrous sulfate 325 (65 FE) MG tablet Take 325 mg by mouth 2 (two) times daily.   Fluticasone-Salmeterol (ADVAIR) 250-50 MCG/DOSE AEPB Inhale 1 puff into the lungs in the morning and at bedtime.   furosemide (LASIX) 40 MG tablet Take 1 tablet (40 mg total) by mouth daily.   gabapentin (NEURONTIN) 300 MG capsule Take 300 mg by mouth See admin instructions. Take one capsule (300 mg) by mouth twice daily - may also take 2 more times during the day as needed for pain   HYDROcodone-acetaminophen (NORCO) 10-325 MG tablet Take 1 tablet by mouth every 4 (four) hours  as needed (pain).   insulin glargine (LANTUS) 100 UNIT/ML Solostar Pen Inject 24 Units into the skin in the morning and at bedtime.   insulin lispro (HUMALOG) 100 UNIT/ML injection Inject 50 Units into the skin See admin instructions. Max of 50 units three times daily, pt is unable to provide exact sliding scale   metoprolol tartrate (LOPRESSOR) 25 MG tablet TAKE 1 AND 1/2 TABLETS BY MOUTH TWICE DAILY   montelukast (SINGULAIR) 10 MG tablet Take 10 mg by mouth every morning.   nitroGLYCERIN (NITROSTAT) 0.4 MG SL tablet PLACE 1 TABLET UNDER THE TONGUE EVERY 5 MINUTES AS NEEDED FOR CHEST PAIN.   pantoprazole (PROTONIX) 40 MG tablet Take 1 tablet (40 mg total) by mouth daily.   rosuvastatin (CRESTOR) 40 MG tablet Take 1 tablet (40 mg total) by mouth daily.   valACYclovir (VALTREX) 500 MG tablet Take 500 mg by mouth daily as needed (for flare ups).     Allergies:   Peanut oil, Povidone-iodine, Shellfish allergy, Statins, and Iodine   Social History   Socioeconomic History   Marital status: Single    Spouse name: Not on file   Number of children: Not on file   Years of education: Not on file   Highest education level: Not on file  Occupational History   Not on file  Tobacco Use   Smoking status: Former    Types: Cigarettes   Smokeless tobacco: Never  Vaping Use   Vaping Use: Never used  Substance and Sexual Activity   Alcohol use: Not Currently   Drug use: Not Currently   Sexual activity: Not on file  Other Topics Concern   Not on file  Social History Narrative   Not on file   Social Determinants of Health   Financial Resource Strain: Not on file  Food Insecurity: Not on file  Transportation Needs: Not on file  Physical Activity: Not on file  Stress: Not on file  Social Connections: Not on file     Family History: The patient's family history is not on file.  ROS:   Please see the history of present illness.     All other systems reviewed and are  negative.  EKGs/Labs/Other Studies Reviewed:    The following studies were reviewed today:  Echo 10/13/2021 1. Left ventricular ejection fraction, by estimation, is 40 to 45%. The  left ventricle has mildly decreased function. The left ventricle  demonstrates global hypokinesis. There is moderate left ventricular  hypertrophy. Left ventricular  diastolic  parameters are consistent with Grade II diastolic dysfunction  (pseudonormalization). Elevated left atrial pressure.   2. Right ventricular systolic function is low normal. The right  ventricular size is normal. There is normal pulmonary artery systolic  pressure.   3. Left atrial size was mildly dilated.   4. The mitral valve is abnormal. Moderate mitral valve regurgitation. No  evidence of mitral stenosis.   5. The tricuspid valve is abnormal. Tricuspid valve regurgitation is  moderate.   6. 21 MM INSPIRIS RESILIA AORTIC VALVE is in the AV position. Stable mean  gradient 20 mmHg. . The aortic valve has been repaired/replaced. There is  mild calcification of the aortic valve. There is mild thickening of the  aortic valve. Aortic valve  regurgitation is not visualized. There is a valve present in the aortic  position. Procedure Date: 04/2020.   7. The inferior vena cava is normal in size with greater than 50%  respiratory variability, suggesting right atrial pressure of 3 mmHg.   EKG:  EKG is not ordered today.    Recent Labs: 01/25/2021: TSH 2.520 10/12/2021: B Natriuretic Peptide 160.7 10/13/2021: ALT 15; Hemoglobin 8.5; Magnesium 1.8; Platelets 176 01/04/2022: BUN 16; Creatinine, Ser 0.65; Potassium 4.0; Sodium 146  Recent Lipid Panel    Component Value Date/Time   CHOL 189 10/13/2021 0254   TRIG 103 10/13/2021 0254   HDL 40 (L) 10/13/2021 0254   CHOLHDL 4.7 10/13/2021 0254   VLDL 21 10/13/2021 0254   LDLCALC 128 (H) 10/13/2021 0254     Risk Assessment/Calculations:    CHA2DS2-VASc Score = 6   This indicates a 9.7%  annual risk of stroke. The patient's score is based upon: CHF History: 1 HTN History: 1 Diabetes History: 1 Stroke History: 0 Vascular Disease History: 1 Age Score: 1 Gender Score: 1          Physical Exam:    VS:  BP (!) 140/84   Pulse 74   Ht 5' (1.524 m)   Wt 244 lb 9.6 oz (110.9 kg)   LMP 04/01/2000 (Approximate)   SpO2 96%   BMI 47.77 kg/m        Wt Readings from Last 3 Encounters:  01/04/22 244 lb 9.6 oz (110.9 kg)  10/12/21 262 lb 5.6 oz (119 kg)  09/03/21 263 lb 9.6 oz (119.6 kg)     GEN:  Well nourished, well developed in no acute distress HEENT: Normal NECK: No JVD; No carotid bruits LYMPHATICS: No lymphadenopathy CARDIAC: RRR, no murmurs, rubs, gallops RESPIRATORY:  Clear to auscultation without rales, wheezing or rhonchi  ABDOMEN: Soft, non-tender, non-distended MUSCULOSKELETAL:  No edema; No deformity  SKIN: Warm and dry NEUROLOGIC:  Alert and oriented x 3 PSYCHIATRIC:  Normal affect   ASSESSMENT:    1. Chronic systolic heart failure (HCC)   2. Coronary artery disease involving coronary bypass graft of native heart without angina pectoris   3. S/P AVR (aortic valve replacement)   4. PAF (paroxysmal atrial fibrillation) (HCC)   5. Essential hypertension   6. Hyperlipidemia LDL goal <70   7. Controlled type 2 diabetes mellitus without complication, without long-term current use of insulin (HCC)    PLAN:    In order of problems listed above:  Chronic systolic heart failure: Euvolemic on physical exam.  Patient described urinary urgency, will obtain basic metabolic panel.  If renal function is normal, may need to see PCP or urology for urological issue.  CAD s/p CABG: Denies any recent chest pain.  On Plavix monotherapy  History of AVR: Stable on last echocardiogram  PAF: Postop atrial fibrillation occurred in the setting of CABG.  No recurrence  Hypertension: Blood pressure stable  Hyperlipidemia: On Crestor  DM2: Managed by primary  care provider           Medication Adjustments/Labs and Tests Ordered: Current medicines are reviewed at length with the patient today.  Concerns regarding medicines are outlined above.  Orders Placed This Encounter  Procedures   Basic metabolic panel   No orders of the defined types were placed in this encounter.   Patient Instructions  Medication Instructions:  Your physician recommends that you continue on your current medications as directed. Please refer to the Current Medication list given to you today.  *If you need a refill on your cardiac medications before your next appointment, please call your pharmacy*  Lab Work: Azalee Course, PA-C recommends that you have lab work TODAY:   BMP  If you have labs (blood work) drawn today and your tests are completely normal, you will receive your results only by: MyChart Message (if you have MyChart) OR A paper copy in the mail If you have any lab test that is abnormal or we need to change your treatment, we will call you to review the results.  Testing/Procedures: NONE ordered at this time of appointment   Follow-Up: At [facility], you and your health needs are our priority.  As part of our continuing mission to provide you with exceptional heart care, we have created designated Provider Care Teams.  These Care Teams include your primary Cardiologist (physician) and Advanced Practice Providers (APPs -  Physician Assistants and Nurse Practitioners) who all work together to provide you with the care you need, when you need it.  We recommend signing up for the patient portal called "MyChart".  Sign up information is provided on this After Visit Summary.  MyChart is used to connect with patients for Virtual Visits (Telemedicine).  Patients are able to view lab/test results, encounter notes, upcoming appointments, etc.  Non-urgent messages can be sent to your provider as well.   To learn more about what you can do with MyChart,  go to [email or web address].    Your next appointment:   4-5 month(s)  The format for your next appointment:   In Person  Provider:   Rollene Rotunda, MD     Other Instructions   Important Information About Sugar         Signed, Azalee Course, [place]  01/06/2022 10:16 PM    [facility]

## 2022-01-04 NOTE — Patient Instructions (Signed)
Medication Instructions:  Your physician recommends that you continue on your current medications as directed. Please refer to the Current Medication list given to you today.  *If you need a refill on your cardiac medications before your next appointment, please call your pharmacy*  Lab Work: Almyra Deforest, PA-C recommends that you have lab work TODAY:   BMP  If you have labs (blood work) drawn today and your tests are completely normal, you will receive your results only by: [place] (if you have MyChart) OR A paper copy in the mail If you have any lab test that is abnormal or we need to change your treatment, we will call you to review the results.  Testing/Procedures: NONE ordered at this time of appointment   Follow-Up: At [facility], you and your health needs are our priority.  As part of our continuing mission to provide you with exceptional heart care, we have created designated Provider Care Teams.  These Care Teams include your primary Cardiologist (physician) and Advanced Practice Providers (APPs -  Physician Assistants and Nurse Practitioners) who all work together to provide you with the care you need, when you need it.  We recommend signing up for the patient portal called "MyChart".  Sign up information is provided on this After Visit Summary.  MyChart is used to connect with patients for Virtual Visits (Telemedicine).  Patients are able to view lab/test results, encounter notes, upcoming appointments, etc.  Non-urgent messages can be sent to your provider as well.   To learn more about what you can do with MyChart, go to [email or web address].    Your next appointment:   4-5 month(s)  The format for your next appointment:   In Person  Provider:   Minus Breeding, MD     Other Instructions   Important Information About Sugar

## 2022-01-05 LAB — BASIC METABOLIC PANEL
BUN/Creatinine Ratio: 25 (ref 12–28)
BUN: 16 mg/dL (ref 8–27)
CO2: 19 mmol/L — ABNORMAL LOW (ref 20–29)
Calcium: 9.3 mg/dL (ref 8.7–10.3)
Chloride: 107 mmol/L — ABNORMAL HIGH (ref 96–106)
Creatinine, Ser: 0.65 mg/dL (ref 0.57–1.00)
Glucose: 112 mg/dL — ABNORMAL HIGH (ref 70–99)
Potassium: 4 mmol/L (ref 3.5–5.2)
Sodium: 146 mmol/L — ABNORMAL HIGH (ref 134–144)
eGFR: 97 mL/min/{1.73_m2} (ref >59)

## 2022-01-06 ENCOUNTER — Encounter: Payer: Self-pay | Admitting: Physician Assistant

## 2022-01-20 IMAGING — DX DG CHEST 1V PORT
1 series · 1 of 1 positions shown · non-contrast
Comparison: Chest radiograph dated 03/28/2020.

CLINICAL DATA: 64-year-old female status post CABG.

EXAM:
PORTABLE CHEST 1 VIEW

[chest]
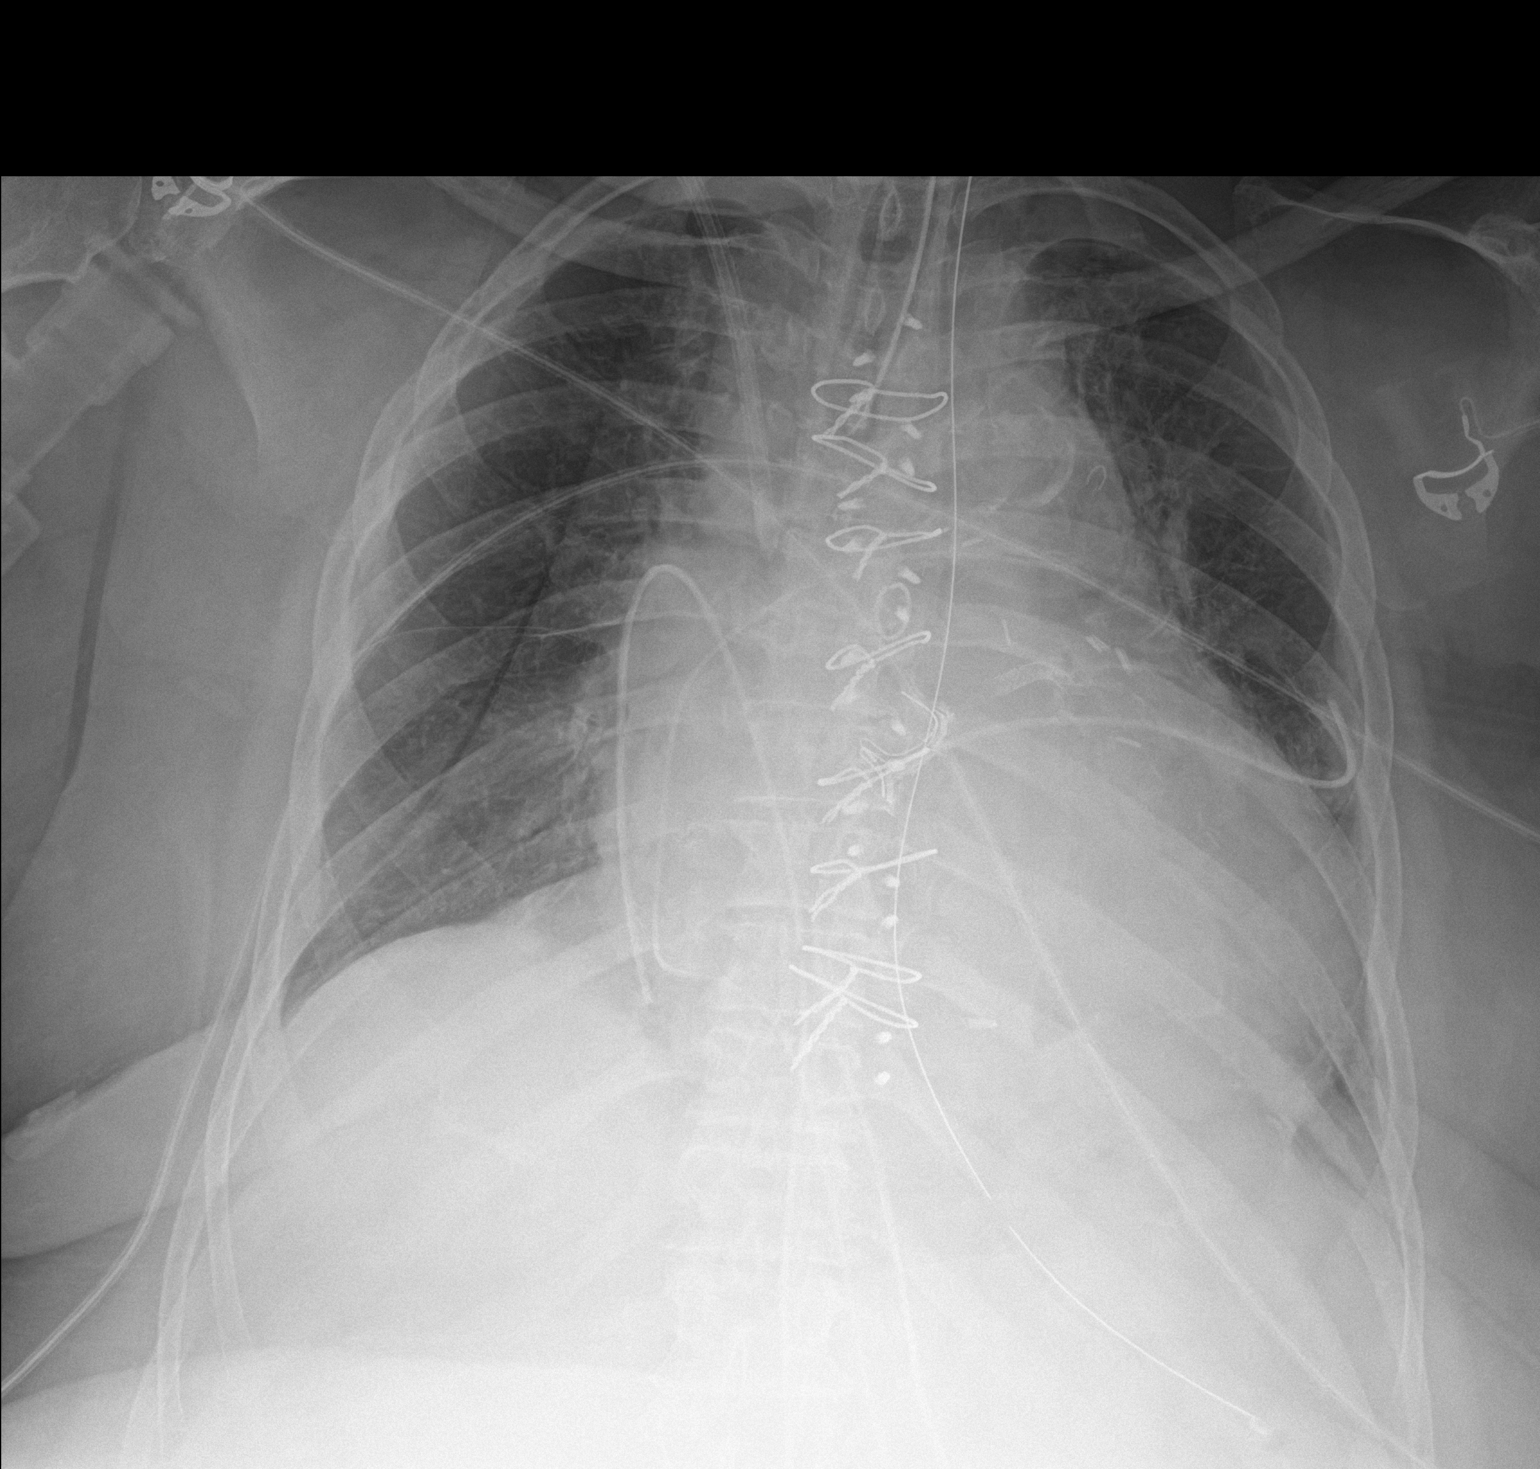

[1 of 1 positions shown; findings below may reference images not displayed]

FINDINGS: Endotracheal tube with tip approximately 2 cm above the carina.
Enteric tube extends below the diaphragm with tip beyond the
inferior margin of the image. Right IJ catheter with tip over
central SVC. Bilateral chest tubes and inferiorly accessed line over
the mediastinum, possibly a drainage catheter.

Extensive postsurgical changes of open heart surgery including
median sternotomy wires, CABG vascular clips, mechanical cardiac
valve, and coronary stent. There is mild enlargement of the
cardiopericardial silhouette slightly increased since the prior
radiograph. There is atherosclerotic calcification of the aorta.
Slight widened appearance of the upper mediastinum, likely related
to postsurgical changes and seroma.

Left lung base atelectasis, less likely infiltrate. No pneumothorax.
No large pleural effusion.
IMPRESSION: 1. Extensive postsurgical changes of open heart surgery. No
pneumothorax.
2. Support lines and tubes as described.
3. Left lung base atelectasis, less likely infiltrate.
4. Mild enlargement of the cardiopericardial silhouette.

## 2022-01-21 IMAGING — DX DG CHEST 1V PORT
1 series · 1 of 1 positions shown · non-contrast
Comparison: 04/03/2020.

CLINICAL DATA: Intubation.

EXAM:
PORTABLE CHEST 1 VIEW

[chest ap]
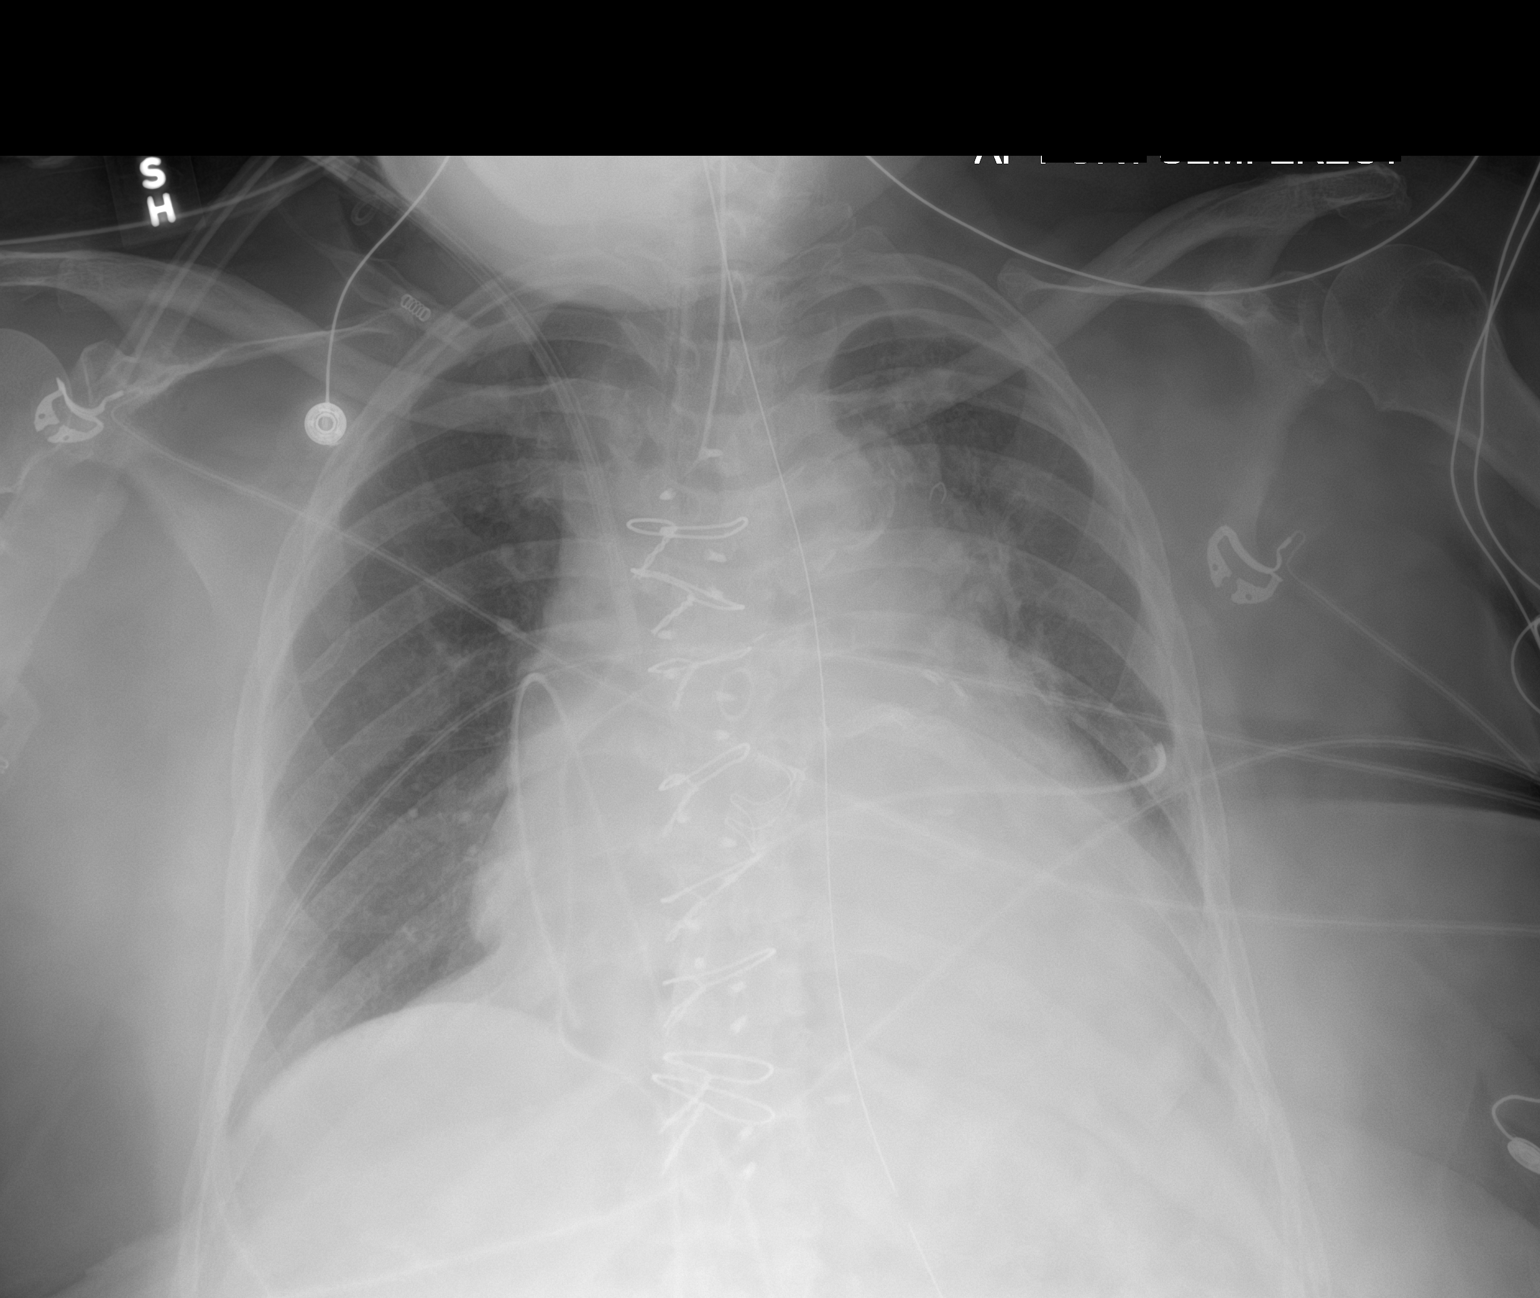

[1 of 1 positions shown; findings below may reference images not displayed]

FINDINGS: Endotracheal tube, NG tube, right IJ sheath, bilateral chest tubes,
mediastinal drainage tube in stable position. Prior CABG and cardiac
valve replacement. Stable cardiomegaly. Low lung volumes with
bibasilar atelectasis. Left base infiltrate cannot be excluded.
Small left pleural effusion cannot be excluded. No pneumothorax.
IMPRESSION: 1. Lines and tubes including bilateral chest tubes in stable
position. No pneumothorax.
2. Prior CABG and cardiac valve replacement. Stable cardiomegaly.
3. Low lung volumes with bibasilar atelectasis. Left base infiltrate
cannot be excluded. Small left pleural effusion cannot be excluded.

## 2022-01-28 ENCOUNTER — Other Ambulatory Visit: Payer: Self-pay | Admitting: Cardiology

## 2022-02-14 ENCOUNTER — Telehealth: Payer: Self-pay | Admitting: Cardiology

## 2022-02-14 MED ORDER — METOPROLOL TARTRATE 25 MG PO TABS: 37.5000 mg | ORAL_TABLET | Freq: Two times a day (BID) | ORAL | 3 refills | Status: DC

## 2022-02-14 NOTE — Telephone Encounter (Signed)
?*  STAT* If patient is at the pharmacy, call can be transferred to refill team. ? ? ?1. Which medications need to be refilled? (please list name of each medication and dose if known) metoprolol tartrate (LOPRESSOR) 25 MG tablet ? ?2. Which pharmacy/location (including street and city if local pharmacy) is medication to be sent to?[id number] - [place], [place] - [address] ? ?3. Do they need a 30 day or 90 day supply? 90  ?

## 2022-02-26 ENCOUNTER — Other Ambulatory Visit: Payer: Self-pay | Admitting: Cardiology

## 2022-05-01 ENCOUNTER — Other Ambulatory Visit: Payer: Self-pay | Admitting: Cardiology

## 2022-05-04 ENCOUNTER — Emergency Department (HOSPITAL_COMMUNITY): Payer: 59

## 2022-05-04 ENCOUNTER — Emergency Department (HOSPITAL_COMMUNITY)
Admission: EM | Admit: 2022-05-04 | Discharge: 2022-05-04 | Disposition: A | Discharge status: Home / Self Care | Payer: 59 | Attending: Emergency Medicine | Admitting: Emergency Medicine

## 2022-05-04 ENCOUNTER — Other Ambulatory Visit: Payer: Self-pay

## 2022-05-04 ENCOUNTER — Encounter (HOSPITAL_COMMUNITY): Payer: Self-pay | Admitting: Pharmacy Technician

## 2022-05-04 DIAGNOSIS — J449 Chronic obstructive pulmonary disease, unspecified: Secondary | ICD-10-CM | POA: Diagnosis not present

## 2022-05-04 DIAGNOSIS — I509 Heart failure, unspecified: Secondary | ICD-10-CM | POA: Diagnosis not present

## 2022-05-04 DIAGNOSIS — R079 Chest pain, unspecified: Secondary | ICD-10-CM | POA: Insufficient documentation

## 2022-05-04 DIAGNOSIS — Z79899 Other long term (current) drug therapy: Secondary | ICD-10-CM | POA: Diagnosis not present

## 2022-05-04 DIAGNOSIS — R0602 Shortness of breath: Secondary | ICD-10-CM | POA: Insufficient documentation

## 2022-05-04 DIAGNOSIS — Z794 Long term (current) use of insulin: Secondary | ICD-10-CM | POA: Insufficient documentation

## 2022-05-04 DIAGNOSIS — Z951 Presence of aortocoronary bypass graft: Secondary | ICD-10-CM | POA: Insufficient documentation

## 2022-05-04 DIAGNOSIS — I2510 Atherosclerotic heart disease of native coronary artery without angina pectoris: Secondary | ICD-10-CM | POA: Diagnosis not present

## 2022-05-04 DIAGNOSIS — Z7951 Long term (current) use of inhaled steroids: Secondary | ICD-10-CM | POA: Insufficient documentation

## 2022-05-04 DIAGNOSIS — Z7902 Long term (current) use of antithrombotics/antiplatelets: Secondary | ICD-10-CM | POA: Insufficient documentation

## 2022-05-04 DIAGNOSIS — I110 Hypertensive heart disease with heart failure: Secondary | ICD-10-CM | POA: Diagnosis not present

## 2022-05-04 DIAGNOSIS — E119 Type 2 diabetes mellitus without complications: Secondary | ICD-10-CM | POA: Diagnosis not present

## 2022-05-04 LAB — CBC
HCT: 38.5 % (ref 36.0–46.0)
Hemoglobin: 11.4 g/dL — ABNORMAL LOW (ref 12.0–15.0)
MCH: 25.3 pg — ABNORMAL LOW (ref 26.0–34.0)
MCHC: 29.6 g/dL — ABNORMAL LOW (ref 30.0–36.0)
MCV: 85.4 fL (ref 80.0–100.0)
Platelets: 230 K/uL (ref 150–400)
RBC: 4.51 MIL/uL (ref 3.87–5.11)
RDW: 17.6 % — ABNORMAL HIGH (ref 11.5–15.5)
WBC: 10.9 K/uL — ABNORMAL HIGH (ref 4.0–10.5)
nRBC: 0 % (ref 0.0–0.2)

## 2022-05-04 LAB — BASIC METABOLIC PANEL WITH GFR
Anion gap: 8 (ref 5–15)
BUN: 15 mg/dL (ref 8–23)
CO2: 23 mmol/L (ref 22–32)
Calcium: 8.7 mg/dL — ABNORMAL LOW (ref 8.9–10.3)
Chloride: 110 mmol/L (ref 98–111)
Creatinine, Ser: 0.67 mg/dL (ref 0.44–1.00)
GFR, Estimated: >60 mL/min
Glucose, Bld: 94 mg/dL (ref 70–99)
Potassium: 3.7 mmol/L (ref 3.5–5.1)
Sodium: 141 mmol/L (ref 135–145)

## 2022-05-04 LAB — TROPONIN I (HIGH SENSITIVITY)
Troponin I (High Sensitivity): 10 ng/L (ref <18)
Troponin I (High Sensitivity): 9 ng/L (ref <18)

## 2022-05-04 NOTE — ED Provider Notes (Signed)
[facility] Provider Note   CSN: [id number] Arrival date & time: 05/04/22  1412     History  Chief Complaint  Patient presents with   Chest Pain   HPI Heidi Morse is a 67 y.o. female with CAD status post CABG x2, PAD, CHF, DM, hypertension and COPD presenting for chest pain.  Started a week ago while sitting in a chair.  It is midsternal.  Nonradiating.  Feels like heartburn.  Nonreproducible.  It is intermittent but has been worse in the last couple days.  Cannot tell if it is exertional.  Endorses associated shortness of breath that is worse with exertion.  Denies calf tenderness.   Chest Pain      Home Medications Prior to Admission medications   Medication Sig Start Date End Date Taking? Authorizing Provider  albuterol (VENTOLIN HFA) 108 (90 Base) MCG/ACT inhaler Inhale 2 puffs into the lungs every 6 (six) hours as needed for wheezing. 03/09/20   [provider]  Capsaicin-Menthol (SALONPAS GEL-PATCH HOT EX) Apply 1 patch topically 2 (two) times daily. Knee pain    [provider]  cholecalciferol (VITAMIN D3) 25 MCG (1000 UNIT) tablet Take 1,000 Units by mouth 2 (two) times daily.    [provider]  clopidogrel (PLAVIX) 75 MG tablet TAKE 1 TABLET(75 MG) BY MOUTH DAILY WITH BREAKFAST 01/29/22   Minus Breeding, MD  empagliflozin (JARDIANCE) 10 MG TABS tablet Take 1 tablet (10 mg total) by mouth daily before breakfast. 09/14/20   Barrett, Evelene Croon, PA-C  ferrous sulfate 325 (65 FE) MG tablet Take 325 mg by mouth 2 (two) times daily. 03/24/20   [provider]  Fluticasone-Salmeterol (ADVAIR) 250-50 MCG/DOSE AEPB Inhale 1 puff into the lungs in the morning and at bedtime. 01/27/20   [provider]  furosemide (LASIX) 40 MG tablet Take 1 tablet (40 mg total) by mouth daily. 09/03/21   Minus Breeding, MD  gabapentin (NEURONTIN) 300 MG capsule Take 300 mg by mouth See admin instructions. Take  one capsule (300 mg) by mouth twice daily - may also take 2 more times during the day as needed for pain 02/01/20   [provider]  HYDROcodone-acetaminophen (NORCO) 10-325 MG tablet Take 1 tablet by mouth every 4 (four) hours as needed (pain).    [provider]  insulin glargine (LANTUS) 100 UNIT/ML Solostar Pen Inject 24 Units into the skin in the morning and at bedtime.    [provider]  insulin lispro (HUMALOG) 100 UNIT/ML injection Inject 50 Units into the skin See admin instructions. Max of 50 units three times daily, pt is unable to provide exact sliding scale    [provider]  metoprolol tartrate (LOPRESSOR) 25 MG tablet TAKE 1 AND 1/2 TABLETS BY MOUTH TWICE DAILY 05/01/22   Minus Breeding, MD  montelukast (SINGULAIR) 10 MG tablet Take 10 mg by mouth every morning. 01/05/20   [provider]  nitroGLYCERIN (NITROSTAT) 0.4 MG SL tablet PLACE 1 TABLET UNDER THE TONGUE EVERY 5 MINUTES AS NEEDED FOR CHEST PAIN. 11/27/20   Minus Breeding, MD  pantoprazole (PROTONIX) 40 MG tablet Take 1 tablet (40 mg total) by mouth daily. 06/01/21   Minus Breeding, MD  rosuvastatin (CRESTOR) 40 MG tablet TAKE 1 TABLET(40 MG) BY MOUTH DAILY 02/27/22   Minus Breeding, MD  valACYclovir (VALTREX) 500 MG tablet Take 500 mg by mouth daily as needed (for flare ups).    [provider]  Allergies    Peanut oil, Povidone-iodine, Shellfish allergy, Statins, and Iodine    Review of Systems   Review of Systems  Cardiovascular:  Positive for chest pain.    Physical Exam   Vitals:   05/04/22 1420 05/04/22 1422  BP:  (!) 170/79  Pulse: 64   Resp: 18   Temp: 98.5 F (36.9 C)   SpO2: 99%     CONSTITUTIONAL:  well-appearing, NAD NEURO:  Alert and oriented x 3, CN 3-12 grossly intact EYES:  eyes equal and reactive ENT/NECK:  Supple, no stridor  CARDIO:  regular rateand  rhythm, appears well-perfused  PULM:  No respiratory distress, CTAB GI/GU:   non-distended, soft MSK/SPINE:  No gross deformities, no edema, moves all extremities  SKIN:  no rash, atraumatic   *Additional and/or pertinent findings included in MDM below    ED Results / Procedures / Treatments   Labs (all labs ordered are listed, but only abnormal results are displayed) Labs Reviewed  BASIC METABOLIC PANEL - Abnormal; Notable for the following components:      Result Value   Calcium 8.7 (*)    All other components within normal limits  CBC - Abnormal; Notable for the following components:   WBC 10.9 (*)    Hemoglobin 11.4 (*)    MCH 25.3 (*)    MCHC 29.6 (*)    RDW 17.6 (*)    All other components within normal limits  TROPONIN I (HIGH SENSITIVITY)  TROPONIN I (HIGH SENSITIVITY)    EKG EKG Interpretation  Date/Time:  Saturday May 04 2022 14:04:13 EST Ventricular Rate:  68 PR Interval:  142 QRS Duration: 82 QT Interval:  418 QTC Calculation: 444 R Axis:   3 Text Interpretation: Sinus rhythm No significant change since last tracing Confirmed by Lajean Saver [phone]) on 05/04/2022 4:08:21 PM  Radiology DG Chest 2 View  Result Date: 05/04/2022 CLINICAL DATA:  Chest pain for 2 weeks. EXAM: CHEST - 2 VIEW COMPARISON:  October 29, 2021 FINDINGS: Stable postsurgical changes. Enlarged cardiac silhouette, not significantly changed. Calcific atherosclerotic disease and tortuosity of the aorta. There is no evidence of focal airspace consolidation, pleural effusion or pneumothorax. Osseous structures are without acute abnormality. Soft tissues are grossly normal. IMPRESSION: Stably enlarged cardiac silhouette. No evidence of pulmonary edema or lung disease. Electronically Signed   By: Fidela Salisbury M.D.   On: 05/04/2022 15:15    Procedures Procedures    Medications Ordered in ED Medications - No data to display  ED Course/ Medical Decision Making/ A&P                             Medical Decision Making Amount and/or Complexity of Data  Reviewed Labs: ordered. Radiology: ordered.   Initial Impression and Ddx 67 year old female who is well-appearing and hemodynamically stable with an extensive cardiac history presenting for chest pain.  Exam unremarkable.  DDx includes ACS, CHF exacerbation, PE, and COPD exacerbation. Patient PMH that increases complexity of ED encounter: CAD, PAD, type 2 diabetes, hypertension and COPD  Interpretation of Diagnostics I independent reviewed and interpreted the labs as followed: Mild leukocytosis, anemia  - I independently visualized the following imaging with scope of interpretation limited to determining acute life threatening conditions related to emergency care: Chest x-ray, which revealed stable enlarged cardiac silhouette but no acute cardiopulmonary findings  I personally reviewed and interpreted her EKG which revealed sinus rhythm  Patient Reassessment and Ultimate Disposition/Management  Given her extensive cardiac history, primary concern was ACS.  Fortunately evaluation was overall unremarkable with negative troponins and reassuring EKG.  Upon reevaluation patient stated that she did feel much better.  Heart score was 4 given her history I did reach out to Dr. Koleen Nimrod of cardiology who advised that she would be appropriate for outpatient management with her cardiologist.  Advised her to follow-up with her cardiologist.  Discussed return precautions.  Patient management required discussion with the following services or consulting groups:  Cardiology  Complexity of Problems Addressed Acute complicated illness or Injury  Additional Data Reviewed and Analyzed Further history obtained from: Past medical history and medications listed in the EMR, Prior ED visit notes, and Prior labs/imaging results  Patient Encounter Risk Assessment Consideration of hospitalization         Final Clinical Impression(s) / ED Diagnoses Final diagnoses:  Chest pain, unspecified type    Rx  / DC Orders ED Discharge Orders     None         Harriet Pho, PA-C 05/04/22 1936    Lajean Saver, MD 05/06/22 1531

## 2022-05-04 NOTE — ED Triage Notes (Addendum)
Pt c/o intermittent burning, pressure CP x 2 weeks, worse today; worse with exertion, endorses some sob; denies N/V; states urologist started her  on a new med a weeks ago

## 2022-05-04 NOTE — Discharge Instructions (Addendum)
Evaluation for your chest pain today was overall reassuring.  I do strongly recommend however that you follow-up with your cardiologist in the next 2 to 3 days regarding this encounter today.  If you have new chest pain or shortness of breath or any other concerning complaint please return to the emergency department for further evaluation.

## 2022-05-06 NOTE — Progress Notes (Unsigned)
Cardiology Office Note   Date:  05/08/2022   ID:  [name], DOB 1955/08/17, MRN [id number]  PCP:  Meliton Rattan, MD  Cardiologist:   Minus Breeding, MD    Chief Complaint  Patient presents with   Chest Pain     History of Present Illness: Heidi Morse is a 67 y.o. female who presents for evaluation CAD s/p CABG, aortic stenosis s/p AVR, postop atrial fibrillation, ischemic cardiomyopathy, PAD s/p left common femoral artery stenting 11/2017, bilateral carotid artery disease s/p stenting of left ICA, COPD, HTN, HLD, DM 2, and obstructive sleep apnea.  She had chest pain and had an endoscopy in December 2021 that showed bleeding AVMs.  She was subsequently admitted at [facility] for anemia and GI Bleed.  She was seen at [facility] ED on 07/27/2019 due to persistent chest pain unrelieved by nitroglycerin.  Work-up was unremarkable.  She was felt to have noncardiac type chest discomfort.  She presented to [facility] near the end of December for evaluation of chest pain again.  Troponin was negative.  Hemoglobin was 7.6 and the she was transfused with 1 pack of red blood cell.  GI service was contacted who did not recommend any intervention.  She subsequently underwent left heart cath that showed patent left main stent with distal moderate restenosis, haziness noted via ostial LAD suggesting moderate restenosis of the stent, severe restenosis of the ostial left circumflex stent.  Echocardiogram showed EF 45 to 50%, mild LVH, grade 2 DD, mild to moderate AI, moderate aortic stenosis and moderate MR. She was seen by CT surgery and eventually underwent CABG x2 with LIMA to LAD and SVG to OM1 as well as AVR on 04/03/2020 by Dr. Kipp Brood.  Postop course was complicated by atrial fibrillation with RVR.  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 She was given digoxin and beta-blocker.  She subsequently converted back to sinus rhythm therefore was not started on  anticoagulation therapy. She had increased SOB.  She had cath in 2022 and had high grade stenosis of an SVG for which she had angioplasty and had improvement in her symptoms.  She has been followed at [facility] for GI bleeding. She underwent endoscopy 12/22/2020 at [facility] which revealed 3 bleeding areas in her duodenum which were treated with APC, a single bleeding area in her jejunum also treated with APC.  She follows with hematology oncology Dr. Cruzita Lederer at [facility].  She had been placed on oral iron therapy.     Since I last saw her she was in the ED a few days ago with chest pain.   There was no evidence of ischemia.  I reviewed these records for this visit.  She said that she was having what she felt like was heartburn.  It would come and go when she was laying down at night.  She thought it was brought on by certain foods.  She denies any associated symptoms such as nausea vomiting or diaphoresis.  She was not having any radiation to her jaw or to her arms.  She does not think this feels like previous symptoms.  The patient denies any new symptoms such as chest discomfort, neck or arm discomfort. There has been no new shortness of breath, PND or orthopnea. There have been no reported palpitations, presyncope or syncope.  Of note she was not able to get [place] because of insurance problems.   Past Medical History:  Diagnosis Date  Asthma    Bilateral carotid artery stenosis    CAD (coronary artery disease)    s/p CABG x2 in 04/2020   CHF (congestive heart failure) (HCC)    COPD (chronic obstructive pulmonary disease) (HCC)    Diabetes mellitus without complication (HCC)    Hypertension    MI (myocardial infarction) ([place])    PAD (peripheral artery disease) ([place])    Sleep apnea     Past Surgical History:  Procedure Laterality Date   AORTIC ROOT ENLARGEMENT N/A 04/03/2020   Procedure: AORTIC ROOT ENLARGEMENT USING HEMASHIELD PLATINUM WOVEN DOUBLE VELOUR VASCULAR GRAFT 28 MM X 30 CM;  Surgeon:  Lajuana Matte, MD;  Location: [place];  Service: Open Heart Surgery;  Laterality: N/A;   AORTIC VALVE REPLACEMENT N/A 04/03/2020   Procedure: CORONARY ARTERY BYPASS GRAFTING X 2 ON CARDIOPULMONARY BYPASS. AORTIC VALVE REPLACEMENT USING 21 MM INSPIRIS RESILIA AORTIC VALVE, STERNAL PLATING;  Surgeon: Lajuana Matte, MD;  Location: [place];  Service: Open Heart Surgery;  Laterality: N/A;  Coronary artery bypass grafting  Flow Trac   CORONARY STENT INTERVENTION N/A 09/12/2020   Procedure: CORONARY STENT INTERVENTION;  Surgeon: Jettie Booze, MD;  Location: [place] [facility];  Service: Cardiovascular;  Laterality: N/A;   ENDOVEIN HARVEST OF GREATER SAPHENOUS VEIN Right 04/03/2020   Procedure: ENDOVEIN HARVEST OF GREATER SAPHENOUS VEIN;  Surgeon: Lajuana Matte, MD;  Location: [place];  Service: Open Heart Surgery;  Laterality: Right;   IR RADIOLOGIST EVAL & MGMT  06/07/2021   LEFT HEART CATH AND CORONARY ANGIOGRAPHY N/A 03/30/2020   Procedure: LEFT HEART CATH AND CORONARY ANGIOGRAPHY;  Surgeon: Burnell Blanks, MD;  Location: [place] [facility];  Service: Cardiovascular;  Laterality: N/A;   RIGHT/LEFT HEART CATH AND CORONARY/GRAFT ANGIOGRAPHY N/A 09/12/2020   Procedure: RIGHT/LEFT HEART CATH AND CORONARY/GRAFT ANGIOGRAPHY;  Surgeon: Jettie Booze, MD;  Location: [place] [facility];  Service: Cardiovascular;  Laterality: N/A;   TEE WITHOUT CARDIOVERSION N/A 04/03/2020   Procedure: TRANSESOPHAGEAL ECHOCARDIOGRAM (TEE);  Surgeon: Lajuana Matte, MD;  Location: [place];  Service: Open Heart Surgery;  Laterality: N/A;     Current Outpatient Medications  Medication Sig Dispense Refill   albuterol (VENTOLIN HFA) 108 (90 Base) MCG/ACT inhaler Inhale 2 puffs into the lungs every 6 (six) hours as needed for wheezing.     Capsaicin-Menthol (SALONPAS GEL-PATCH HOT EX) Apply 1 patch topically 2 (two) times daily. Knee pain     cholecalciferol (VITAMIN D3) 25 MCG (1000 UNIT) tablet  Take 1,000 Units by mouth 2 (two) times daily.     clopidogrel (PLAVIX) 75 MG tablet TAKE 1 TABLET(75 MG) BY MOUTH DAILY WITH BREAKFAST 90 tablet 3   ferrous sulfate 325 (65 FE) MG tablet Take 325 mg by mouth 2 (two) times daily.     Fluticasone-Salmeterol (ADVAIR) 250-50 MCG/DOSE AEPB Inhale 1 puff into the lungs in the morning and at bedtime.     gabapentin (NEURONTIN) 300 MG capsule Take 300 mg by mouth See admin instructions. Take one capsule (300 mg) by mouth twice daily - may also take 2 more times during the day as needed for pain     HYDROcodone-acetaminophen (NORCO) 10-325 MG tablet Take 1 tablet by mouth every 4 (four) hours as needed (pain).     insulin glargine (LANTUS) 100 UNIT/ML Solostar Pen Inject 24 Units into the skin in the morning and at bedtime.     insulin lispro (HUMALOG) 100 UNIT/ML injection Inject 50 Units  into the skin See admin instructions. Max of 50 units three times daily, pt is unable to provide exact sliding scale     metoprolol tartrate (LOPRESSOR) 25 MG tablet TAKE 1 AND 1/2 TABLETS BY MOUTH TWICE DAILY 270 tablet 3   montelukast (SINGULAIR) 10 MG tablet Take 10 mg by mouth every morning.     nitroGLYCERIN (NITROSTAT) 0.4 MG SL tablet PLACE 1 TABLET UNDER THE TONGUE EVERY 5 MINUTES AS NEEDED FOR CHEST PAIN. 25 tablet 3   pantoprazole (PROTONIX) 40 MG tablet Take 1 tablet (40 mg total) by mouth daily. 30 tablet 11   rosuvastatin (CRESTOR) 40 MG tablet TAKE 1 TABLET(40 MG) BY MOUTH DAILY 90 tablet 3   sacubitril-valsartan (ENTRESTO) 24-26 MG Take 1 tablet by mouth 2 (two) times daily. 60 tablet 6   solifenacin (VESICARE) 5 MG tablet Take 5 mg by mouth daily.     valACYclovir (VALTREX) 500 MG tablet Take 500 mg by mouth daily as needed (for flare ups).     empagliflozin (JARDIANCE) 10 MG TABS tablet Take 1 tablet (10 mg total) by mouth daily before breakfast. 30 tablet 11   furosemide (LASIX) 40 MG tablet Take 0.5 tablets (20 mg total) by mouth daily. May take an  extra 0.5 tablet if needed 90 tablet 3   No current facility-administered medications for this visit.    Allergies:   Peanut oil, Povidone-iodine, Shellfish allergy, Simvastatin, Statins, and Iodine    ROS:  Please see the history of present illness.   Otherwise, review of systems are positive for none.   All other systems are reviewed and negative.    PHYSICAL EXAM: VS:  BP (!) 151/78 (BP Location: Left Arm, Patient Position: Sitting, Cuff Size: Large)   Pulse 72   Ht 5' (1.524 m)   Wt 236 lb (107 kg)   LMP 04/01/2000 (Approximate)   SpO2 96%   BMI 46.09 kg/m  , BMI Body mass index is 46.09 kg/m. GENERAL:  Well appearing NECK:  No jugular venous distention, waveform within normal limits, carotid upstroke brisk and symmetric, no bruits, no thyromegaly LUNGS:  Clear to auscultation bilaterally CHEST:  Unremarkable HEART:  PMI not displaced or sustained,S1 and S2 within normal limits, no S3, no S4, no clicks, no rubs, no murmurs ABD:  Flat, positive bowel sounds normal in frequency in pitch, no bruits, no rebound, no guarding, no midline pulsatile mass, no hepatomegaly, no splenomegaly EXT:  2 plus pulses throughout, no edema, no cyanosis no clubbing    EKG:  EKG is not ordered today. NA   Recent Labs: 10/12/2021: B Natriuretic Peptide 160.7 10/13/2021: ALT 15; Magnesium 1.8 05/04/2022: BUN 15; Creatinine, Ser 0.67; Hemoglobin 11.4; Platelets 230; Potassium 3.7; Sodium 141    Lipid Panel    Component Value Date/Time   CHOL 189 10/13/2021 0254   TRIG 103 10/13/2021 0254   HDL 40 (L) 10/13/2021 0254   CHOLHDL 4.7 10/13/2021 0254   VLDL 21 10/13/2021 0254   LDLCALC 128 (H) 10/13/2021 0254      Wt Readings from Last 3 Encounters:  05/08/22 236 lb (107 kg)  05/04/22 233 lb (105.7 kg)  01/04/22 244 lb 9.6 oz (110.9 kg)    Diagnostic cadiac cath 08/2020 Dominance: Right    Intervention       Other studies Reviewed: Additional studies/ records that were  reviewed today include:  ED records Review of the above records demonstrates:  Please see elsewhere in the note.    ASSESSMENT AND  PLAN:  Symptomatic Anemia Last hemoglobin was 11.4 in the emergency room.  No change in therapy.   CAD I do not suspect that she is having any anginal symptoms.  She will continue with risk reduction.   Acute on chronic diastolic HF The EF was 40 - 45%.  I might try Entresto 24/26 twice daily.  We are going to rewrite the Jardiance to see if she can get it because there not filling the prescription.  Her Lasix will be 20 mg with 40 mg as needed.  We will see her back for med titration and follow-up echo in the future.  I will get a basic metabolic profile in a week to 10 days.  We talked about salt and fluid restriction.  She seems to be euvolemic today.  HLD Total cholesterol was 128.  I would like to repeat a lipid profile when she comes back for a basic metabolic profile.   HTN Blood pressure is going to be managed in the context of managing her heart failure.   AVR We will see her back for med titration at that point which plan to schedule follow-up echo for the summer.    Obesity She needs to continue to work on diet and exercise. Current medicines are reviewed at length with the patient today.  The patient does not have concerns regarding medicines.  The following changes have been made: As above  Labs/ tests ordered today include:  None  Orders Placed This Encounter  Procedures   Basic metabolic panel   Lipid panel    Disposition:   FU with APP in 6 weeks.   Signed, Rollene Rotunda, MD  05/08/2022 10:54 AM     [facility]

## 2022-05-08 ENCOUNTER — Ambulatory Visit: Payer: 59 | Attending: Cardiology | Admitting: Cardiology

## 2022-05-08 ENCOUNTER — Encounter: Payer: Self-pay | Admitting: Cardiology

## 2022-05-08 VITALS — BP 151/78 | HR 72 | Ht 60.0 in | Wt 236.0 lb

## 2022-05-08 DIAGNOSIS — I25700 Atherosclerosis of coronary artery bypass graft(s), unspecified, with unstable angina pectoris: Secondary | ICD-10-CM | POA: Diagnosis not present

## 2022-05-08 DIAGNOSIS — E785 Hyperlipidemia, unspecified: Secondary | ICD-10-CM

## 2022-05-08 DIAGNOSIS — I10 Essential (primary) hypertension: Secondary | ICD-10-CM

## 2022-05-08 DIAGNOSIS — I5022 Chronic systolic (congestive) heart failure: Secondary | ICD-10-CM

## 2022-05-08 DIAGNOSIS — Z952 Presence of prosthetic heart valve: Secondary | ICD-10-CM

## 2022-05-08 MED ORDER — FUROSEMIDE 40 MG PO TABS: 20.0000 mg | ORAL_TABLET | Freq: Every day | ORAL | 3 refills | Status: DC

## 2022-05-08 MED ORDER — ENTRESTO 24-26 MG PO TABS: 1.0000 | ORAL_TABLET | Freq: Two times a day (BID) | ORAL | 6 refills | Status: DC

## 2022-05-08 MED ORDER — EMPAGLIFLOZIN 10 MG PO TABS: 10.0000 mg | ORAL_TABLET | Freq: Every day | ORAL | 11 refills | Status: DC

## 2022-05-08 NOTE — Patient Instructions (Addendum)
Medication Instructions:   Continue current medication and   Start taking  Entresto  24/26 mg one tablet twice a day   Decrease furosemide to 20 mg daily  may take an additional 20 mg ( 0.5 tablet of the 40 mg ) if needed.  *If you need a refill on your cardiac medications before your next appointment, please call your pharmacy*   Lab Work:  BMP,lipid  in 2 weeks  after starting Delene Loll   If you have labs (blood work) drawn today and your tests are completely normal, you will receive your results only by: [place] (if you have MyChart) OR A paper copy in the mail If you have any lab test that is abnormal or we need to change your treatment, we will call you to review the results.   Testing/Procedures: Not  needed   Follow-Up: At [facility], you and your health needs are our priority.  As part of our continuing mission to provide you with exceptional heart care, we have created designated Provider Care Teams.  These Care Teams include your primary Cardiologist (physician) and Advanced Practice Providers (APPs -  Physician Assistants and Nurse Practitioners) who all work together to provide you with the care you need, when you need it.  We recommend signing up for the patient portal called "MyChart".  Sign up information is provided on this After Visit Summary.  MyChart is used to connect with patients for Virtual Visits (Telemedicine).  Patients are able to view lab/test results, encounter notes, upcoming appointments, etc.  Non-urgent messages can be sent to your provider as well.   To learn more about what you can do with MyChart, go to [email or web address].    Your next appointment:   6 week(s)  The format for your next appointment:   In Person  Provider:   Coletta Memos, FNP, Fabian Sharp, PA-C, Sande Rives, PA-C, Caron Presume, PA-C, Almyra Deforest, PA-C, or Diona Browner, NP.

## 2022-05-22 LAB — BASIC METABOLIC PANEL
BUN/Creatinine Ratio: 14 (ref 12–28)
BUN: 12 mg/dL (ref 8–27)
CO2: 22 mmol/L (ref 20–29)
Calcium: 9.4 mg/dL (ref 8.7–10.3)
Chloride: 106 mmol/L (ref 96–106)
Creatinine, Ser: 0.84 mg/dL (ref 0.57–1.00)
Glucose: 113 mg/dL — ABNORMAL HIGH (ref 70–99)
Potassium: 3.8 mmol/L (ref 3.5–5.2)
Sodium: 144 mmol/L (ref 134–144)
eGFR: 77 mL/min/{1.73_m2} (ref >59)

## 2022-05-22 LAB — LIPID PANEL
Chol/HDL Ratio: 3.2 ratio (ref 0.0–4.4)
Cholesterol, Total: 181 mg/dL (ref 100–199)
HDL: 56 mg/dL (ref >39)
LDL Chol Calc (NIH): 104 mg/dL — ABNORMAL HIGH (ref 0–99)
Triglycerides: 119 mg/dL (ref 0–149)
VLDL Cholesterol Cal: 21 mg/dL (ref 5–40)

## 2022-05-24 ENCOUNTER — Telehealth: Payer: Self-pay | Admitting: *Deleted

## 2022-05-24 DIAGNOSIS — E785 Hyperlipidemia, unspecified: Secondary | ICD-10-CM

## 2022-05-24 MED ORDER — EZETIMIBE 10 MG PO TABS: 10.0000 mg | ORAL_TABLET | Freq: Every day | ORAL | 3 refills | Status: DC

## 2022-05-24 NOTE — Telephone Encounter (Signed)
pt aware of results  New script sent to the pharmacy  Lab orders mailed to the pt  

## 2022-05-24 NOTE — Telephone Encounter (Signed)
-----   Message from Minus Breeding, MD sent at 05/23/2022  4:46 PM EST ----- Her LDL is not at goal.  Please make sure that she is taking the Crestor 40 mg and please have her start Zetia 10 mg PO daily and repeat a lipid profile in 10 weeks.  Call Ms. Sprowl with the results and send results to Meliton Rattan, MD

## 2022-05-29 ENCOUNTER — Other Ambulatory Visit (HOSPITAL_COMMUNITY): Payer: Medicare Other

## 2022-06-05 NOTE — Progress Notes (Deleted)
Cardiology Office Note:    Date:  06/05/2022   ID:  Heidi Morse, DOB 1955-08-15, MRN [id number]  PCP:  Heidi Rattan, MD  Cardiologist:  Minus Breeding, MD  Electrophysiologist:  None   Referring MD: Heidi Rattan, MD   Chief Complaint: follow-up of CAD and CHF  History of Present Illness:    Heidi Morse is a 67 y.o. female with a history of CAD s/p PCI to left main/ostial LAD/ostial CX and recent CABG x2 (LIMA to LAD and SVG to OM1) on 04/03/2020 with subsequent DES to SVG-OM1 graft in 08/2020, aortic stenosis s/p AVR in 04/2020 at time of CABG, post-op atrial fibrillation not on anticoagulation, ischemic cardiomyopathy with chronic combined CHF and EF of 40-45% on Echo in 09/2021, PAD s/p stenting of left common iliac artery in 11/2017, bilateral carotid artery stenosis s/p stenting of left ICA, COPD/asthma followed by Pulmonology, obstructive sleep apnea, hypertension, hyperlipidemia, type 2 diabetes mellitus, GI bleeding requiring transfusions, chronic anemia, obesity, and chronic pain on narcotics who is a patient of Dr. Percival Spanish and presents today for follow-up of CAD and CHF.   Patient was previously followed at [facility] for her cardiac care. She has had multiple cardiac catheterizations. From June 2020 to March 2021, she underwent 5 cardiac catheterization and was treated with stenting to left main, ostial LAD, and ostial CX. She was seen by CT surgery on 2 occasions but it was felt that her CAD could be better treated with PCI. She has had chronic chest pain some of which was felt to be non-cardiac in nature. She also has significant PAD of carotid arteries and lower extremities. She has undergone stenting to left internal carotid arteries as well as left common iliac artery in the past. She also history of GI bleeding and anemia. 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 She was recently admitted from 03/23/2020 to 03/24/2020 at [facility] for anemia/recent GI  bleed and chest pain. Troponin was negative and EKG was non-ischemic. Imaging was negative for active GI bleed. Therefore, she was discharged on Plavix and Aspirin. She returned to [facility] ED on 03/27/2020 for persistent chest pain relieved by Nitro. Work-up was unremarkable. It was again felt to be non-cardiac in origin. Therefore, she was discharged.   Family was frustrated with persistent pain so patient decided to come to [facility] on 03/28/2020 for second opinion and additional evaluation. On presentation , high-sensitivity troponin was negative and hemoglobin was 7.6. She received 1 unit of PRBC and GI was consulted. There was no active bleed and GI did not feel like any intervention was needed. Given significant cardiac history, Cardiology was consulted and decision was made to proceed with cardiac catheterizatoin. LHC showed patent left main stent with distal moderate restenosis. This stent extended into the LAD and there was haziness in the ostium of the LAD suggesting at least moderate restenosis of the stent. Thre was also severe restenosis of ostial CX stent. Echo showed LVEF of 45-50% with mild LVH, grade 2 diastolic dysfunction, mild to moderate AI, moderate AS, and moderate MR. CT surgery was consulted and patient ended up undergoing CABG x2 (LIMA to LAD and RSVG to OM1) as well as AVR on 04/03/2020 with Dr. Kipp Brood. She developed post-op atrial fibrillation with RVR on 04/09/2020. Given iodine allergy, she was unable to be started on Amiodarone. Therefore, she was started on Digoxin and beta-blocker was increased. She ultimately converted back to sinus rhythm. She was not started on  anticoagulation.   She had a repeat right/left cardiac catheterization in 08/2020 for further evaluation of increased dyspnea and this showed 95% stenosis of the ostial SVG to OM which was successfully treated with DES.   She underwent endoscopy in 11/2020 at [facility] which showed 3 bleeding areas in her duodenum and 1  bleeding area in the jejunum which were all treated with APC. She was later started on oral iron therapy by HemOnc.   She was admitted at [facility] in 08/2021 for a COPD exacerbation after presenting with shortness of breath. She was treated with a 5 day course of Prednisone and a 3 days course of Azithromycin. She was then readmitted in 09/2021 with chest pain. EKG showed no acute ischemic changes. High-sensitivity troponin was normal. Echo showed LVEF of 40-45% with global hypokinesis and grade 2 diastolic dysfunction. Chest pain resolved on its own She presented back to the [facility] ED in 09/2021 with shortness of breath and lower extremity edema. Venous dopplers were negative for DVT and symptoms improved with IV Lasix.  Patient was last seen by Dr. Percival Spanish on 05/08/2022 after recent ED visit visit for chest pain. There was no evidence of ischemia during work-up at the ED. She described the pain as heartburn  and thought it was brought on my certain foods. Symptoms were not felt to be cardiac in nature.   Patient presents today for follow-up. ***  CAD s/p Recent CABG History of stenting to left main, ostial LAD, and ostial CX. S/p recent CABG x2  (LIMA to LAD and SVG to OM1) on 04/03/2020 with Dr. Kipp Brood. Repeat LHC in 08/2020 showed 95% stenosis of the ostial SVG to OM which was successfully treated with DES. However, LIMA to LAD was patent.  - No angina. - Continue Plavix 75mg  daily.  - Continue beta-blocker and high-intensity statin.  Chronic Combined CHF Ischemic Cardiomyopathy Last Echo in 09/2021 showed LVEF of 40-45% with global hypokinesis, moderate LVH, and grade 2 diastolic dysfunction as well as low normal RV function and moderate MR. - Euvolemic on exam.  - Continue Entresto 24-26mg  twice daily. - Currently on Lopressor 37.5mg  twice daily. Will transition to Toprol-XL *** given reduced EF.  - Continue Jardiance 10mg  daily.  - Spironolactone *** - Discussed importance of daily  weight and sodium/ fluid restrictions.    Aortic Stenosis s/p AVR  S/p AVR in 04/2020 at time of CABG. Last Echo in 09/2021 showed stable AVR with mean gradient of 20 mmHg.  - Continue SBE prophylaxis prior to any future dental work.    Post-Op Atrial Fibrillation Developed post-op atrial fibrillation following CABG/AVR. Converted back to sinus rhythm with Digoxin and increased in beta-blocker. Not on anticoagulation given no recurrence. - Maintaining sinus rhythm.  - Continue beta-blocker as above. - CHA2DS2-VASc = 6 (CAD, CHF, HTN, DM, age, gender). Not on anticoagulation given atrial fibrillation occurred in post-op setting and she has had no recurrence since then. If she does have any documented recurrence, will need to consider DOAC.   PAD S/p stenting to left common iliac artery in 2019. Looks like she had repeat dopplers done in 12/2021 at [facility] but I cannot see the results of this. - Continue Plavix and statin.  - Followed by Vascular Surgery at [facility].   Bilateral Carotid Artery Stenosis S/p stenting to left ICA. Carotid dopplers in 04/2020 showed  40-59% stenosis of right ICA and <50% stenosis of left ICA. Looks like she had repeat  dopplers done in 12/2021 at [facility] but I cannot see the results of this. - Continue Plavix and statin. - Followed by Vascular Surgery at [facility].    Hypertension BP well controlled. *** - Continue medications for CHF as above.  Hyperlipidemia Lipid panel on 05/21/2022: Total Cholesterol 181, Triglycerides 119, HDL 56, LDL 104.  - Continue Crestor 40mg  daily.  - Continue Zetia 10mg  daily which was started at time of last lab check.  - Plan is for repeat lipid panel 10 weeks after starting Zetia. Labs have already been ordered. ***   Type 2 Diabetes Hemoglobin A1c 6.4% in 04/2022 - Followed by Endocrinology at [facility].    Chronic Anemia with History of GI  Bleed History of GI bleed secondary to AVM. Hemoglobin stable at 11.4 in 05/2022.  - Followed by GI and HemOnc at [facility]. ***     Past Medical History:  Diagnosis Date   Asthma    Bilateral carotid artery stenosis    CAD (coronary artery disease)    s/p CABG x2 in 04/2020   CHF (congestive heart failure) (HCC)    COPD (chronic obstructive pulmonary disease) (HCC)    Diabetes mellitus without complication ([place])    Hypertension    MI (myocardial infarction) ([place])    PAD (peripheral artery disease) ([place])    Sleep apnea     Past Surgical History:  Procedure Laterality Date   AORTIC ROOT ENLARGEMENT N/A 04/03/2020   Procedure: AORTIC ROOT ENLARGEMENT USING HEMASHIELD PLATINUM WOVEN DOUBLE VELOUR VASCULAR GRAFT 28 MM X 30 CM;  Surgeon: Lajuana Matte, MD;  Location: [place];  Service: Open Heart Surgery;  Laterality: N/A;   AORTIC VALVE REPLACEMENT N/A 04/03/2020   Procedure: CORONARY ARTERY BYPASS GRAFTING X 2 ON CARDIOPULMONARY BYPASS. AORTIC VALVE REPLACEMENT USING 21 MM INSPIRIS RESILIA AORTIC VALVE, STERNAL PLATING;  Surgeon: Lajuana Matte, MD;  Location: [place];  Service: Open Heart Surgery;  Laterality: N/A;  Coronary artery bypass grafting  Flow Trac   CORONARY STENT INTERVENTION N/A 09/12/2020   Procedure: CORONARY STENT INTERVENTION;  Surgeon: Jettie Booze, MD;  Location: [place] [facility];  Service: Cardiovascular;  Laterality: N/A;   ENDOVEIN HARVEST OF GREATER SAPHENOUS VEIN Right 04/03/2020   Procedure: ENDOVEIN HARVEST OF GREATER SAPHENOUS VEIN;  Surgeon: Lajuana Matte, MD;  Location: [place];  Service: Open Heart Surgery;  Laterality: Right;   IR RADIOLOGIST EVAL & MGMT  06/07/2021   LEFT HEART CATH AND CORONARY ANGIOGRAPHY N/A 03/30/2020   Procedure: LEFT HEART CATH AND CORONARY ANGIOGRAPHY;  Surgeon: Burnell Blanks, MD;  Location: [place] [facility];  Service: Cardiovascular;  Laterality: N/A;   RIGHT/LEFT HEART CATH AND CORONARY/GRAFT ANGIOGRAPHY  N/A 09/12/2020   Procedure: RIGHT/LEFT HEART CATH AND CORONARY/GRAFT ANGIOGRAPHY;  Surgeon: Jettie Booze, MD;  Location: [place] [facility];  Service: Cardiovascular;  Laterality: N/A;   TEE WITHOUT CARDIOVERSION N/A 04/03/2020   Procedure: TRANSESOPHAGEAL ECHOCARDIOGRAM (TEE);  Surgeon: Lajuana Matte, MD;  Location: [place];  Service: Open Heart Surgery;  Laterality: N/A;    Current Medications: No outpatient medications have been marked as taking for the 06/19/22 encounter (Appointment) with Darreld Mclean, PA-C.     Allergies:   Peanut oil, Povidone-iodine, Shellfish allergy, Simvastatin, Statins, and Iodine   Social History   Socioeconomic History   Marital status: Single    Spouse name: Not on file   Number of children: Not on  file   Years of education: Not on file   Highest education level: Not on file  Occupational History   Not on file  Tobacco Use   Smoking status: Former    Types: Cigarettes   Smokeless tobacco: Never  Vaping Use   Vaping Use: Never used  Substance and Sexual Activity   Alcohol use: Not Currently   Drug use: Not Currently   Sexual activity: Not on file  Other Topics Concern   Not on file  Social History Narrative   Not on file   Social Determinants of Health   Financial Resource Strain: Not on file  Food Insecurity: Not on file  Transportation Needs: Not on file  Physical Activity: Not on file  Stress: Not on file  Social Connections: Not on file     Family History: The patient's family history is not on file.  ROS:   Please see the history of present illness.     EKGs/Labs/Other Studies Reviewed:    The following studies were reviewed:  Right/ Left Cardiac Catheterization 09/12/2020: Dist LM to Ost LAD lesion is 60% stenosed. LIMA to LAD is widely patent. Prox RCA to Mid RCA lesion is 40% stenosed. Could not selectively engage from the left radial but appeared patent. Ost Cx lesion is 99% stenosed. In stent  restenosis. Origin lesion is 95% stenosed in the SVG to OM. A drug-eluting stent was successfully placed using a SYNERGY XD 3.50X20, postdilated to > 4 mm. Post intervention, there is a 0% residual stenosis. Hemodynamic findings consistent with mild pulmonary hypertension. Ao sat 98%; PA sat 71%; PA pressure 40/15, mean PA 33 mm Hg; mean PCWP 15 mm Hg; CO 8.47 L/min; CI 4.3 A drug-eluting stent was successfully placed using a SYNERGY XD 3.50X20.   Successful PCI of the ostial vein graft to OM.  Given her issues with a slow GI bleed, will hold aspirin and use Plavix monotherapy.  She will have further work-up at [facility] with double-balloon enteroscopy.  We will continue iron supplementation.  We will have to base need for transfusion on hemoglobin in the morning.  Baseline hemoglobin of 9.2 and gradually trending down over the last few months.  Diagnostic Dominance: Right  Intervention    _______________  Echocardiogram 10/13/2021: Impressions: 1. Left ventricular ejection fraction, by estimation, is 40 to 45%. The  left ventricle has mildly decreased function. The left ventricle  demonstrates global hypokinesis. There is moderate left ventricular  hypertrophy. Left ventricular diastolic  parameters are consistent with Grade II diastolic dysfunction  (pseudonormalization). Elevated left atrial pressure.   2. Right ventricular systolic function is low normal. The right  ventricular size is normal. There is normal pulmonary artery systolic  pressure.   3. Left atrial size was mildly dilated.   4. The mitral valve is abnormal. Moderate mitral valve regurgitation. No  evidence of mitral stenosis.   5. The tricuspid valve is abnormal. Tricuspid valve regurgitation is  moderate.   6. 21 MM INSPIRIS RESILIA AORTIC VALVE is in the AV position. Stable mean  gradient 20 mmHg. . The aortic valve has been repaired/replaced. There is  mild calcification of the aortic valve. There is mild thickening  of the  aortic valve. Aortic valve  regurgitation is not visualized. There is a valve present in the aortic  position. Procedure Date: 04/2020.   7. The inferior vena cava is normal in size with greater than 50%  respiratory variability, suggesting right atrial pressure of 3 mmHg.  EKG:  EKG not ordered today.   Recent Labs: 10/12/2021: B Natriuretic Peptide 160.7 10/13/2021: ALT 15; Magnesium 1.8 05/04/2022: Hemoglobin 11.4; Platelets 230 05/21/2022: BUN 12; Creatinine, Ser 0.84; Potassium 3.8; Sodium 144  Recent Lipid Panel    Component Value Date/Time   CHOL 181 05/21/2022 1354   TRIG 119 05/21/2022 1354   HDL 56 05/21/2022 1354   CHOLHDL 3.2 05/21/2022 1354   CHOLHDL 4.7 10/13/2021 0254   VLDL 21 10/13/2021 0254   LDLCALC 104 (H) 05/21/2022 1354    Physical Exam:    Vital Signs: LMP 04/01/2000 (Approximate)     Wt Readings from Last 3 Encounters:  05/08/22 236 lb (107 kg)  05/04/22 233 lb (105.7 kg)  01/04/22 244 lb 9.6 oz (110.9 kg)     General: 67 y.o. female in no acute distress. HEENT: Normocephalic and atraumatic. Sclera clear. EOMs intact. Neck: Supple. No carotid bruits. No JVD. Heart: *** RRR. Distinct S1 and S2. No murmurs, gallops, or rubs. Radial and distal pedal pulses 2+ and equal bilaterally. Lungs: No increased work of breathing. Clear to ausculation bilaterally. No wheezes, rhonchi, or rales.  Abdomen: Soft, non-distended, and non-tender to palpation. Bowel sounds present in all 4 quadrants.  MSK: Normal strength and tone for age. *** Extremities: No lower extremity edema.    Skin: Warm and dry. Neuro: Alert and oriented x3. No focal deficits. Psych: Normal affect. Responds appropriately.   Assessment:    No diagnosis found.  Plan:     Disposition: Follow up in ***   Medication Adjustments/Labs and Tests Ordered: Current medicines are reviewed at length with the patient today.  Concerns regarding medicines are outlined above.  No orders of  the defined types were placed in this encounter.  No orders of the defined types were placed in this encounter.   There are no Patient Instructions on file for this visit.   Signed, Darreld Mclean, PA-C  06/05/2022 7:23 AM    [place]

## 2022-06-12 IMAGING — DX DG CHEST 2V
2 series · 2 of 2 positions shown · non-contrast
Comparison: 05/19/2020

CLINICAL DATA: 64-year-old female with shortness of breath

EXAM:
CHEST - 2 VIEW

[w chest pa]
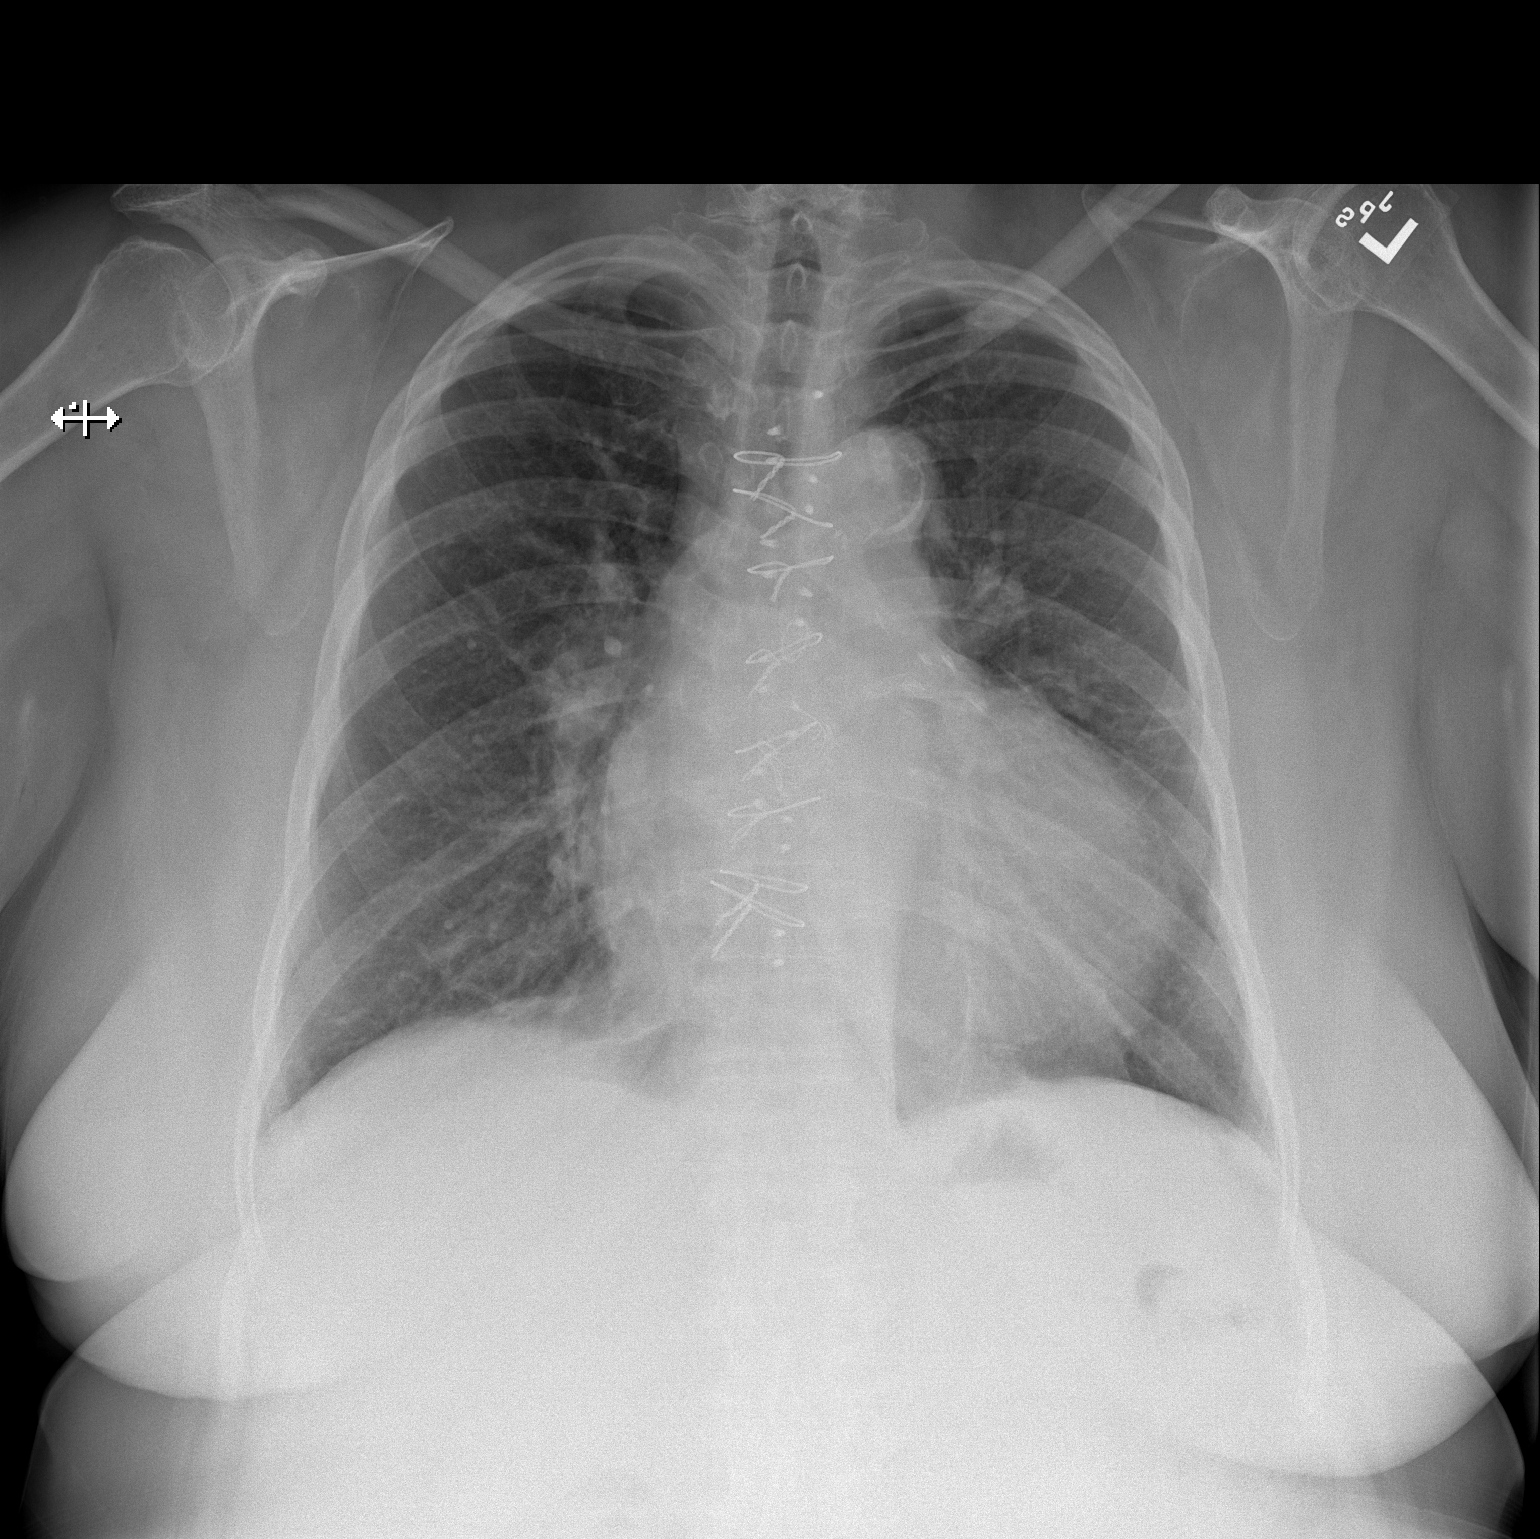

[w chest lat]
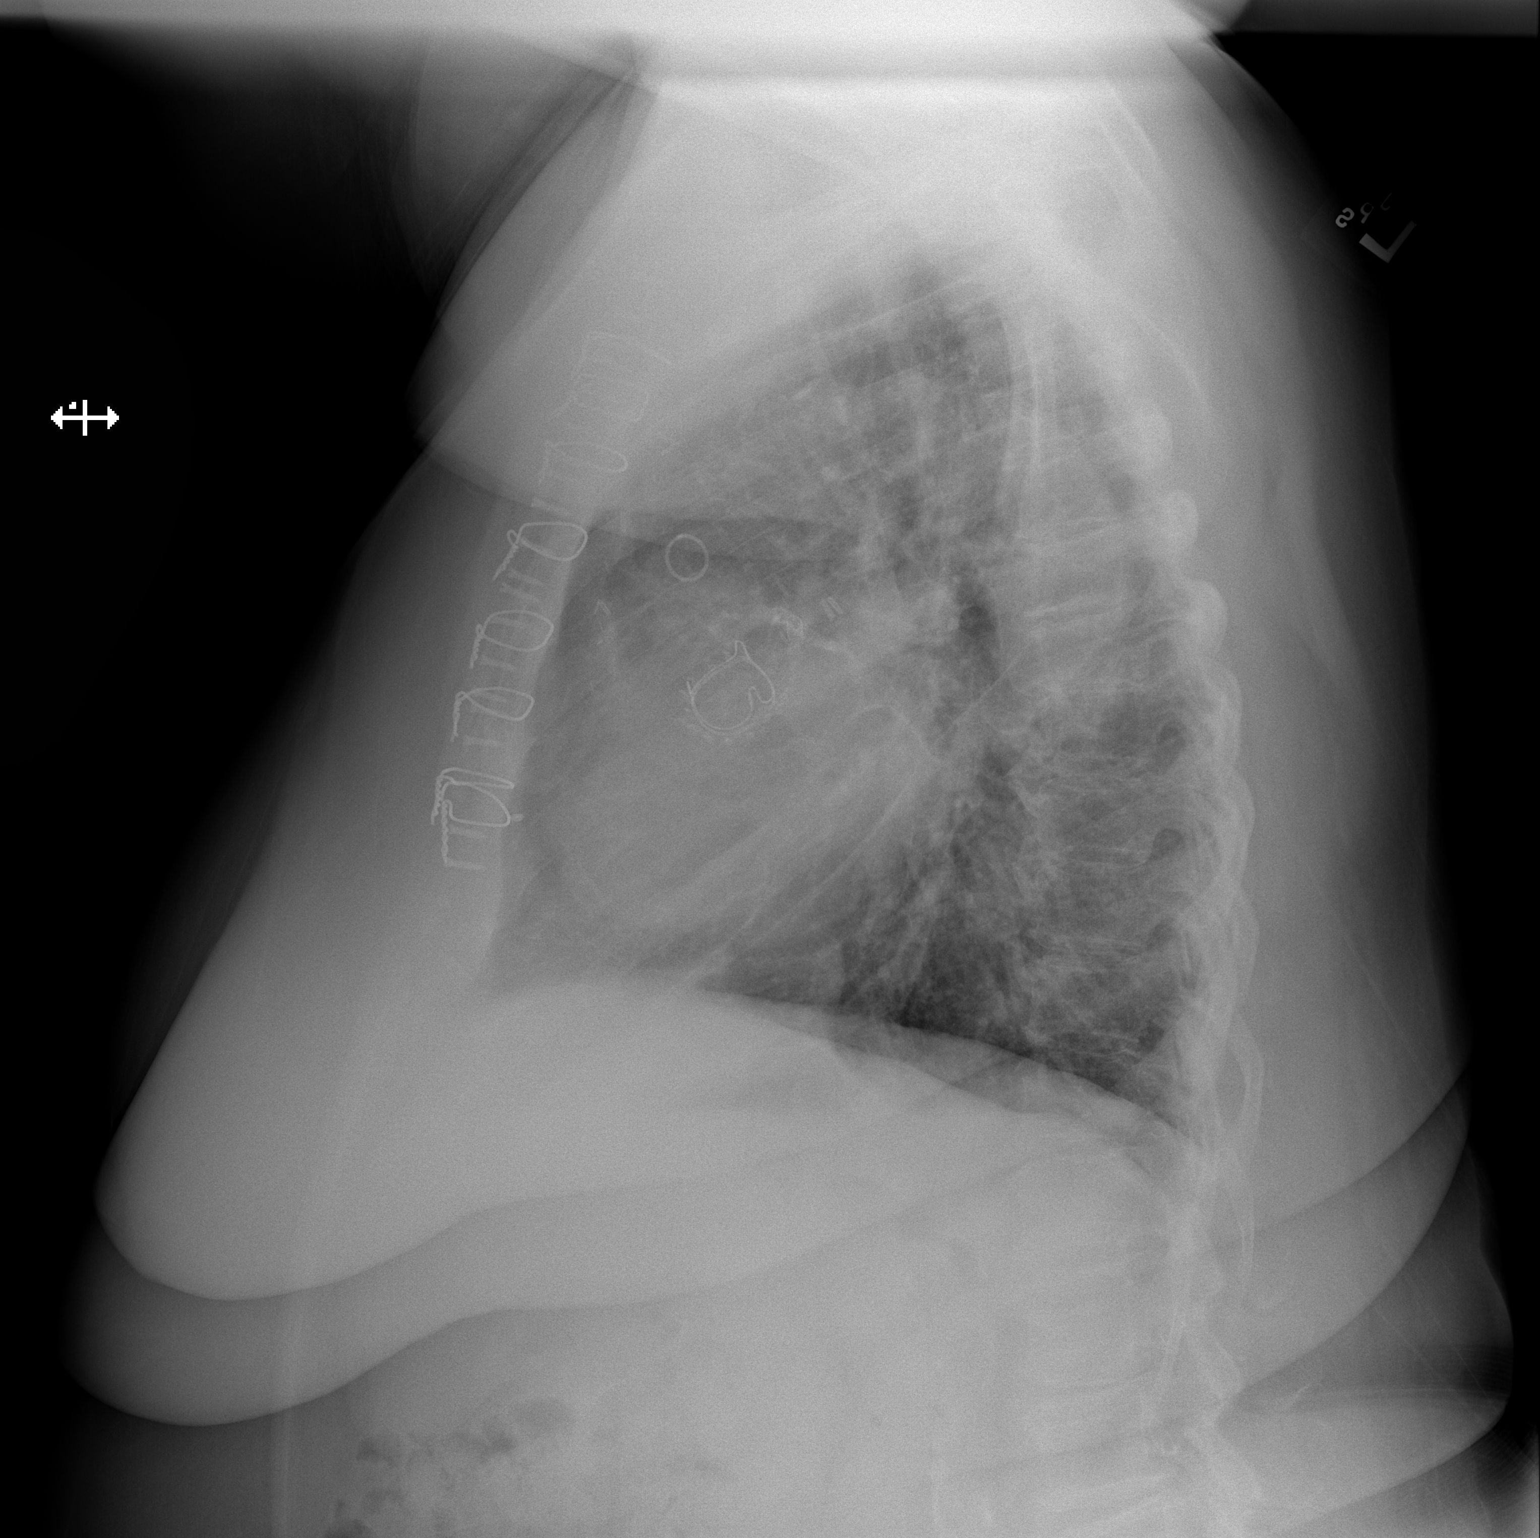

[2 of 2 positions shown; findings below may reference images not displayed]

FINDINGS: Cardiomediastinal silhouette unchanged in size and contour. No
evidence of central vascular congestion. No interlobular septal
thickening.

Surgical changes of median sternotomy, CABG, aortic valve
replacement.

Resolution of the opacity at the left lung base compared to the
prior. No pneumothorax or pleural effusion. Coarsened interstitial
markings, with no confluent airspace disease.

No acute displaced fracture. Degenerative changes of the spine.
IMPRESSION: Improved appearance of the chest x-ray compared to the prior plain
film, with no definite evidence of acute cardiopulmonary disease.

Surgical changes of median sternotomy, CABG, aortic valve repair

## 2022-06-14 ENCOUNTER — Ambulatory Visit: Payer: Medicare Other | Admitting: Cardiology

## 2022-06-16 IMAGING — DX DG CHEST 1V PORT
1 series · 1 of 1 positions shown · non-contrast
Comparison: Chest x-ray 08/24/2020

CLINICAL DATA: 64-year-old female with history of chest pain and
shortness of breath.

EXAM:
PORTABLE CHEST 1 VIEW

[chest ap]
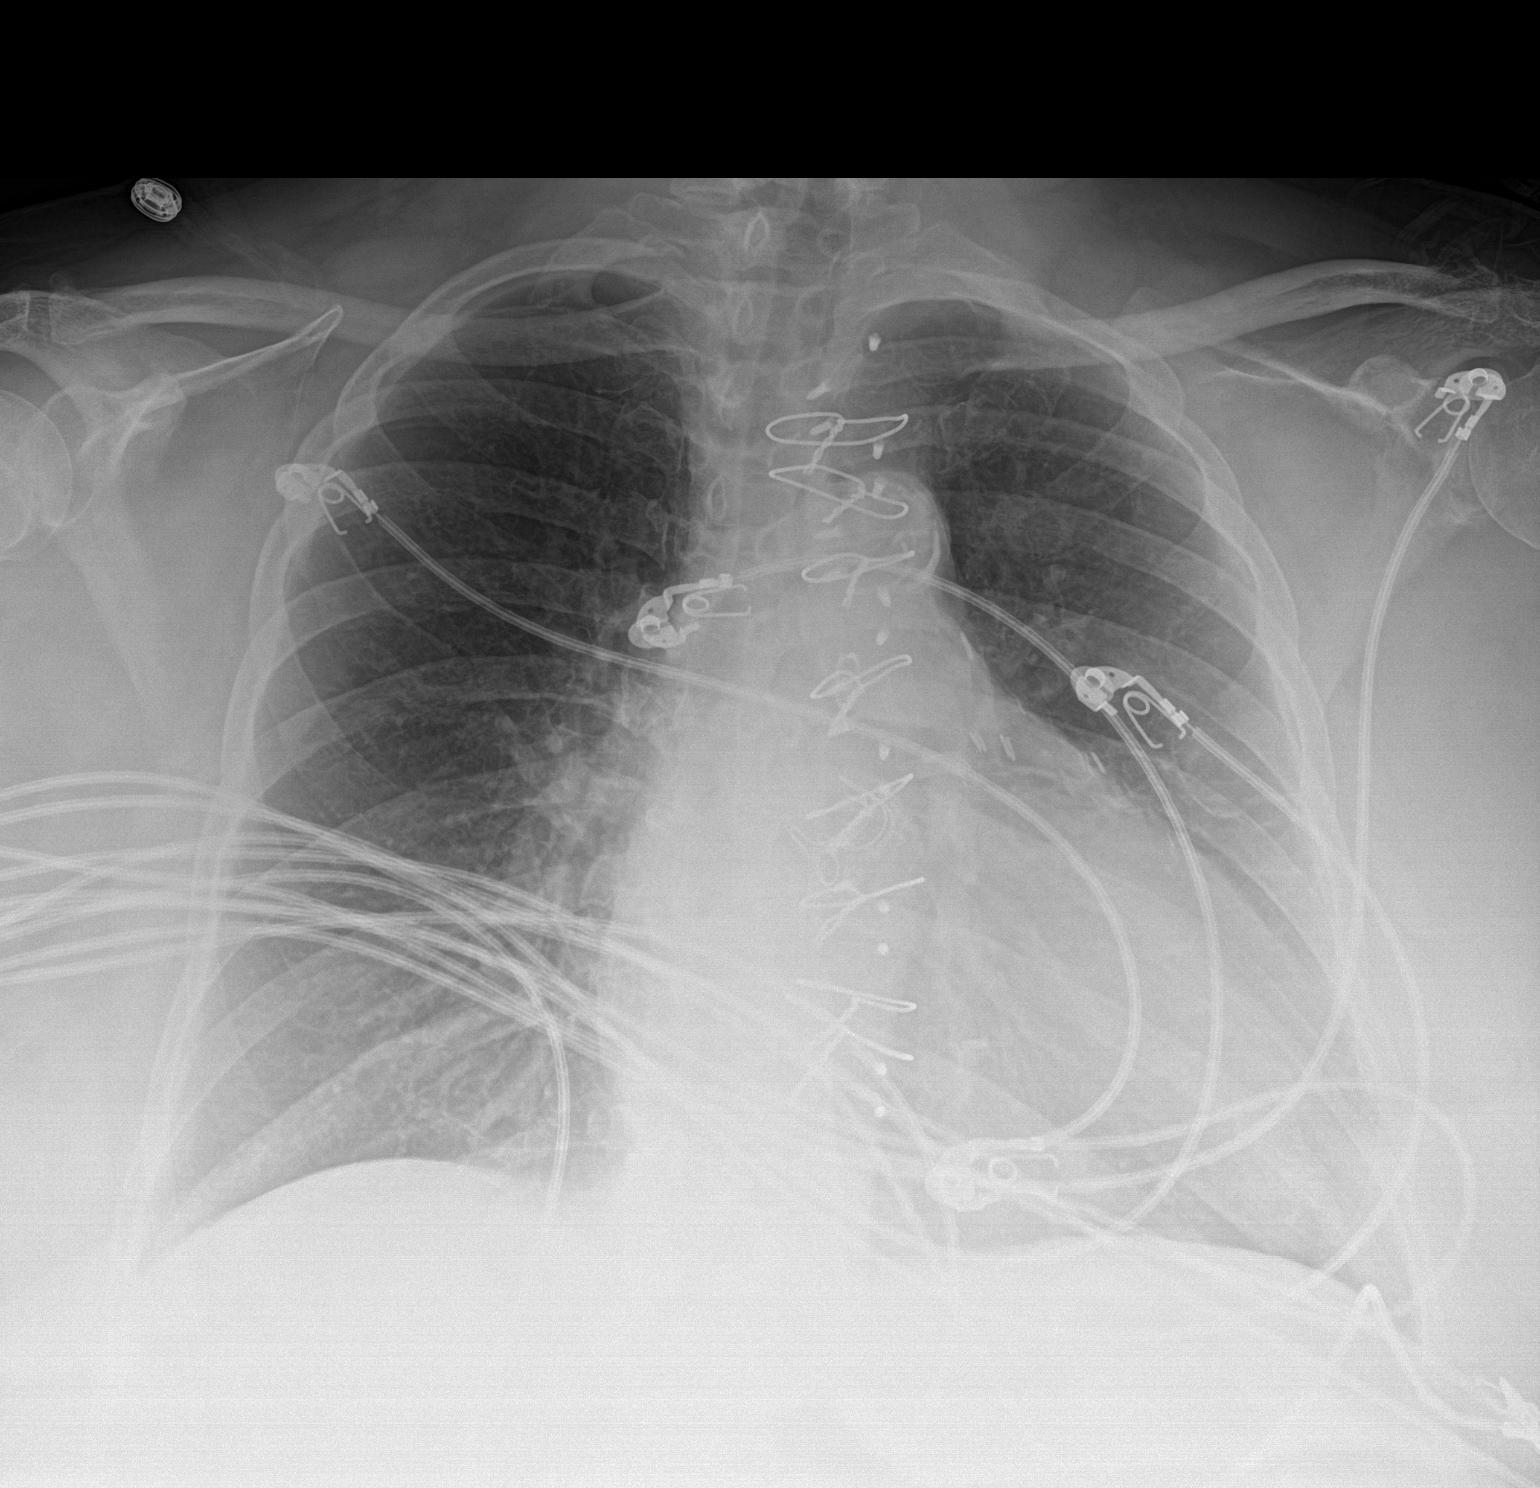

[1 of 1 positions shown; findings below may reference images not displayed]

FINDINGS: Lung volumes are normal. No consolidative airspace disease. No
pleural effusions. No pneumothorax. No evidence of pulmonary edema.
No definite suspicious appearing pulmonary nodules or masses are
noted. Moderate cardiomegaly. Upper mediastinal contours are within
normal limits. Aortic atherosclerosis. Status post median sternotomy
for CABG and aortic valve replacement.
IMPRESSION: 1. No radiographic evidence of acute cardiopulmonary disease.
2. Aortic atherosclerosis.
3. Cardiomegaly.

## 2022-06-19 ENCOUNTER — Ambulatory Visit: Payer: 59 | Admitting: Student

## 2022-07-13 NOTE — Progress Notes (Signed)
Cardiology Office Note:    Date:  07/18/2022   ID:  Prairie Doakes, DOB 16-Nov-1955, MRN [id number]  PCP:  Cathey Endow, MD  Cardiologist:  Rollene Rotunda, MD  Electrophysiologist:  None   Referring MD: Cathey Endow, MD   Chief Complaint: follow-up of CAD and CHF  History of Present Illness:    Atziry Markey is a 67 y.o. female with a history of CAD s/p PCI to left main/ostial LAD/ostial CX and recent CABG x2 (LIMA to LAD and SVG to OM1) on 04/03/2020 with subsequent DES to SVG-OM1 graft in 08/2020, aortic stenosis s/p AVR in 04/2020 at time of CABG, post-op atrial fibrillation not on anticoagulation, ischemic cardiomyopathy with chronic combined CHF and EF of 40-45% on Echo in 09/2021, PAD s/p stenting of left common iliac artery in 11/2017, bilateral carotid artery stenosis s/p stenting of left ICA, COPD/asthma followed by Pulmonology, obstructive sleep apnea, hypertension, hyperlipidemia, type 2 diabetes mellitus, GI bleeding requiring transfusions, chronic anemia, obesity, and chronic pain on narcotics who is a patient of Dr. Antoine Poche and presents today for follow-up of CAD and CHF.   Patient was previously followed at [facility] for her cardiac care. She has had multiple cardiac catheterizations. From June 2020 to March 2021, she underwent 5 cardiac catheterization and was treated with stenting to left main, ostial LAD, and ostial CX. She was seen by CT surgery on 2 occasions but it was felt that her CAD could be better treated with PCI. She has had chronic chest pain some of which was felt to be non-cardiac in nature. She also has significant PAD of carotid arteries and lower extremities. She has undergone stenting to left internal carotid arteries as well as left common iliac artery in the past. She also history of GI bleeding and anemia. 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 She was recently admitted from 03/23/2020 to 03/24/2020 at [facility] for anemia/recent GI  bleed and chest pain. Troponin was negative and EKG was non-ischemic. Imaging was negative for active GI bleed. Therefore, she was discharged on Plavix and Aspirin. She returned to [facility] ED on 03/27/2020 for persistent chest pain relieved by Nitro. Work-up was unremarkable. It was again felt to be non-cardiac in origin. Therefore, she was discharged.   Family was frustrated with persistent pain so patient decided to come to [facility] on 03/28/2020 for second opinion and additional evaluation. On presentation , high-sensitivity troponin was negative and hemoglobin was 7.6. She received 1 unit of PRBC and GI was consulted. There was no active bleed and GI did not feel like any intervention was needed. Given significant cardiac history, Cardiology was consulted and decision was made to proceed with cardiac catheterizatoin. LHC showed patent left main stent with distal moderate restenosis. This stent extended into the LAD and there was haziness in the ostium of the LAD suggesting at least moderate restenosis of the stent. Thre was also severe restenosis of ostial CX stent. Echo showed LVEF of 45-50% with mild LVH, grade 2 diastolic dysfunction, mild to moderate AI, moderate AS, and moderate MR. CT surgery was consulted and patient ended up undergoing CABG x2 (LIMA to LAD and RSVG to OM1) as well as AVR on 04/03/2020 with Dr. Cliffton Asters. She developed post-op atrial fibrillation with RVR on 04/09/2020. Given iodine allergy, she was unable to be started on Amiodarone. Therefore, she was started on Digoxin and beta-blocker was increased. She ultimately converted back to sinus rhythm. She was not started on  anticoagulation.   She had a repeat right/left cardiac catheterization in 08/2020 for further evaluation of increased dyspnea and this showed 95% stenosis of the ostial SVG to OM which was successfully treated with DES.   She underwent endoscopy in 11/2020 at [facility] which showed 3 bleeding areas in her duodenum and 1  bleeding area in the jejunum which were all treated with APC. She was later started on oral iron therapy by HemOnc.   She was admitted at [facility] in 08/2021 for a COPD exacerbation after presenting with shortness of breath. She was treated with a 5 day course of Prednisone and a 3 days course of Azithromycin. She was then readmitted in 09/2021 with chest pain. EKG showed no acute ischemic changes. High-sensitivity troponin was normal. Echo showed LVEF of 40-45% with global hypokinesis and grade 2 diastolic dysfunction. Chest pain resolved on its own She presented back to the [facility] ED in 09/2021 with shortness of breath and lower extremity edema. Venous dopplers were negative for DVT and symptoms improved with IV Lasix.  Patient was last seen by Dr. Antoine Poche on 05/08/2022 after recent ED visit visit for chest pain. There was no evidence of ischemia during work-up at the ED. She described the pain as heartburn  and thought it was brought on my certain foods. Symptoms were not felt to be cardiac in nature.   Patient presents today for follow-up. She states she has been doing fairly well since last visit. However, she continues to have chest discomfort that she describes as heartburn. She states this is sometimes related to meals but not always. It mostly occurs at rest but she is not very active at all. She also reports some of this "heartburn" with exertion such as when she is doing laundry and moving clothes. She also reports a new itching in the back of her throat over the last couple of weeks with an associated cough which sounds like allergies. She denies any shortness of breath, orthopnea, PND. She states her weight fluctuates from 229 lbs to 233 lbs. She denies any palpitations, lightheadedness, dizziness, or syncope. She does report some pain in her right knee as well as pain along the vein graft harvesting site that she describes as a throbbing pain. She also describes some calf pain when walking  although she does not walk much. She has been seen by the pain clinic for this and was advised to follow-up with Vascular Surgery. She is scheduled to see Vascular Surgery at [facility] next week. She denies any discoloration of lower extremities or unhealing wounds. She is also worried that her hemoglobin may be down again because she has been more fatigued. She was previously receiving iron transfusion but these were stopped when hemoglobin improved to around 11 in 05/2022.  Past Medical History:  Diagnosis Date   Asthma    Bilateral carotid artery stenosis    CAD (coronary artery disease)    s/p CABG x2 in 04/2020   CHF (congestive heart failure)    COPD (chronic obstructive pulmonary disease)    Diabetes mellitus without complication    Hypertension    MI (myocardial infarction)    PAD (peripheral artery disease)    Sleep apnea     Past Surgical History:  Procedure Laterality Date   AORTIC ROOT ENLARGEMENT N/A 04/03/2020   Procedure: AORTIC ROOT ENLARGEMENT USING HEMASHIELD PLATINUM WOVEN DOUBLE VELOUR VASCULAR GRAFT 28 MM X 30 CM;  Surgeon: Corliss Skains, MD;  Location: [facility]  OR;  Service: Open Heart Surgery;  Laterality: N/A;   AORTIC VALVE REPLACEMENT N/A 04/03/2020   Procedure: CORONARY ARTERY BYPASS GRAFTING X 2 ON CARDIOPULMONARY BYPASS. AORTIC VALVE REPLACEMENT USING 21 MM INSPIRIS RESILIA AORTIC VALVE, STERNAL PLATING;  Surgeon: Corliss Skains, MD;  Location: [facility] OR;  Service: Open Heart Surgery;  Laterality: N/A;  Coronary artery bypass grafting  Flow Trac   CORONARY STENT INTERVENTION N/A 09/12/2020   Procedure: CORONARY STENT INTERVENTION;  Surgeon: Corky Crafts, MD;  Location: [facility] INVASIVE CV LAB;  Service: Cardiovascular;  Laterality: N/A;   ENDOVEIN HARVEST OF GREATER SAPHENOUS VEIN Right 04/03/2020   Procedure: ENDOVEIN HARVEST OF GREATER SAPHENOUS VEIN;  Surgeon: Corliss Skains, MD;  Location: [facility] OR;  Service: Open Heart Surgery;  Laterality:  Right;   IR RADIOLOGIST EVAL & MGMT  06/07/2021   LEFT HEART CATH AND CORONARY ANGIOGRAPHY N/A 03/30/2020   Procedure: LEFT HEART CATH AND CORONARY ANGIOGRAPHY;  Surgeon: Kathleene Hazel, MD;  Location: [facility] INVASIVE CV LAB;  Service: Cardiovascular;  Laterality: N/A;   RIGHT/LEFT HEART CATH AND CORONARY/GRAFT ANGIOGRAPHY N/A 09/12/2020   Procedure: RIGHT/LEFT HEART CATH AND CORONARY/GRAFT ANGIOGRAPHY;  Surgeon: Corky Crafts, MD;  Location: [facility] INVASIVE CV LAB;  Service: Cardiovascular;  Laterality: N/A;   TEE WITHOUT CARDIOVERSION N/A 04/03/2020   Procedure: TRANSESOPHAGEAL ECHOCARDIOGRAM (TEE);  Surgeon: Corliss Skains, MD;  Location: [facility] OR;  Service: Open Heart Surgery;  Laterality: N/A;    Current Medications: Current Meds  Medication Sig   albuterol (VENTOLIN HFA) 108 (90 Base) MCG/ACT inhaler Inhale 2 puffs into the lungs every 6 (six) hours as needed for wheezing.   cholecalciferol (VITAMIN D3) 25 MCG (1000 UNIT) tablet Take 1,000 Units by mouth 2 (two) times daily.   clopidogrel (PLAVIX) 75 MG tablet TAKE 1 TABLET(75 MG) BY MOUTH DAILY WITH BREAKFAST   empagliflozin (JARDIANCE) 10 MG TABS tablet Take 1 tablet (10 mg total) by mouth daily before breakfast.   ezetimibe (ZETIA) 10 MG tablet Take 1 tablet (10 mg total) by mouth daily.   ferrous sulfate 325 (65 FE) MG tablet Take 325 mg by mouth 2 (two) times daily.   furosemide (LASIX) 40 MG tablet Take 0.5 tablets (20 mg total) by mouth daily. May take an extra 0.5 tablet if needed   gabapentin (NEURONTIN) 300 MG capsule Take 300 mg by mouth See admin instructions. Take one capsule (300 mg) by mouth twice daily - may also take 2 more times during the day as needed for pain   HYDROcodone-acetaminophen (NORCO) 10-325 MG tablet Take 1 tablet by mouth every 4 (four) hours as needed (pain).   insulin glargine (LANTUS) 100 UNIT/ML Solostar Pen Inject 24 Units into the skin in the morning and at bedtime.   insulin lispro (HUMALOG)  100 UNIT/ML injection Inject 50 Units into the skin See admin instructions. Max of 50 units three times daily, pt is unable to provide exact sliding scale   montelukast (SINGULAIR) 10 MG tablet Take 10 mg by mouth every morning.   nitroGLYCERIN (NITROSTAT) 0.4 MG SL tablet PLACE 1 TABLET UNDER THE TONGUE EVERY 5 MINUTES AS NEEDED FOR CHEST PAIN.   rosuvastatin (CRESTOR) 40 MG tablet TAKE 1 TABLET(40 MG) BY MOUTH DAILY   sacubitril-valsartan (ENTRESTO) 24-26 MG Take 1 tablet by mouth 2 (two) times daily.   valACYclovir (VALTREX) 500 MG tablet Take 500 mg by mouth daily as needed (for flare ups).   [DISCONTINUED] metoprolol tartrate (LOPRESSOR) 25 MG tablet TAKE 1  AND 1/2 TABLETS BY MOUTH TWICE DAILY     Allergies:   Peanut oil, Povidone-iodine, Shellfish allergy, Simvastatin, Statins, and Iodine   Social History   Socioeconomic History   Marital status: Single    Spouse name: Not on file   Number of children: Not on file   Years of education: Not on file   Highest education level: Not on file  Occupational History   Not on file  Tobacco Use   Smoking status: Former    Types: Cigarettes   Smokeless tobacco: Never  Vaping Use   Vaping Use: Never used  Substance and Sexual Activity   Alcohol use: Not Currently   Drug use: Not Currently   Sexual activity: Not on file  Other Topics Concern   Not on file  Social History Narrative   Not on file   Social Determinants of Health   Financial Resource Strain: Not on file  Food Insecurity: Not on file  Transportation Needs: Not on file  Physical Activity: Not on file  Stress: Not on file  Social Connections: Not on file     Family History: The patient's family history is not on file.  ROS:   Please see the history of present illness.     EKGs/Labs/Other Studies Reviewed:    The following studies were reviewed:  Right/ Left Cardiac Catheterization 09/12/2020: Dist LM to Ost LAD lesion is 60% stenosed. LIMA to LAD is widely  patent. Prox RCA to Mid RCA lesion is 40% stenosed. Could not selectively engage from the left radial but appeared patent. Ost Cx lesion is 99% stenosed. In stent restenosis. Origin lesion is 95% stenosed in the SVG to OM. A drug-eluting stent was successfully placed using a SYNERGY XD 3.50X20, postdilated to > 4 mm. Post intervention, there is a 0% residual stenosis. Hemodynamic findings consistent with mild pulmonary hypertension. Ao sat 98%; PA sat 71%; PA pressure 40/15, mean PA 33 mm Hg; mean PCWP 15 mm Hg; CO 8.47 L/min; CI 4.3 A drug-eluting stent was successfully placed using a SYNERGY XD 3.50X20.   Successful PCI of the ostial vein graft to OM.  Given her issues with a slow GI bleed, will hold aspirin and use Plavix monotherapy.  She will have further work-up at [facility] with double-balloon enteroscopy.  We will continue iron supplementation.  We will have to base need for transfusion on hemoglobin in the morning.  Baseline hemoglobin of 9.2 and gradually trending down over the last few months.  Diagnostic Dominance: Right  Intervention    _______________  Echocardiogram 10/13/2021: Impressions: 1. Left ventricular ejection fraction, by estimation, is 40 to 45%. The  left ventricle has mildly decreased function. The left ventricle  demonstrates global hypokinesis. There is moderate left ventricular  hypertrophy. Left ventricular diastolic  parameters are consistent with Grade II diastolic dysfunction  (pseudonormalization). Elevated left atrial pressure.   2. Right ventricular systolic function is low normal. The right  ventricular size is normal. There is normal pulmonary artery systolic  pressure.   3. Left atrial size was mildly dilated.   4. The mitral valve is abnormal. Moderate mitral valve regurgitation. No  evidence of mitral stenosis.   5. The tricuspid valve is abnormal. Tricuspid valve regurgitation is  moderate.   6. 21 MM INSPIRIS RESILIA AORTIC VALVE is in the AV  position. Stable mean  gradient 20 mmHg. . The aortic valve has been repaired/replaced. There is  mild calcification of the aortic valve. There is mild thickening of  the  aortic valve. Aortic valve  regurgitation is not visualized. There is a valve present in the aortic  position. Procedure Date: 04/2020.   7. The inferior vena cava is normal in size with greater than 50%  respiratory variability, suggesting right atrial pressure of 3 mmHg.   EKG:  EKG ordered today. EKG personally reviewed and demonstrates sinus bradycardia, rate 56 bpm, with LVH but no acute ST/T changes. Normal PR and QRS intervals. QTc 443 ms.  Recent Labs: 10/12/2021: B Natriuretic Peptide 160.7 10/13/2021: ALT 15; Magnesium 1.8 05/04/2022: Hemoglobin 11.4; Platelets 230 05/21/2022: BUN 12; Creatinine, Ser 0.84; Potassium 3.8; Sodium 144  Recent Lipid Panel    Component Value Date/Time   CHOL 161 07/15/2022 0904   TRIG 97 07/15/2022 0904   HDL 53 07/15/2022 0904   CHOLHDL 3.0 07/15/2022 0904   CHOLHDL 4.7 10/13/2021 0254   VLDL 21 10/13/2021 0254   LDLCALC 90 07/15/2022 0904    Physical Exam:    Vital Signs: BP 130/74   Pulse 65   Ht 5' (1.524 m)   Wt 232 lb (105.2 kg)   LMP 04/01/2000 (Approximate)   SpO2 97%   BMI 45.31 kg/m     Wt Readings from Last 3 Encounters:  07/18/22 232 lb (105.2 kg)  05/08/22 236 lb (107 kg)  05/04/22 233 lb (105.7 kg)     General: 67 y.o. obese female in no acute distress. HEENT: Normocephalic and atraumatic. Sclera clear.  Neck: Supple. Left carotid bruit noted. No JVD. Heart: Mildly bradycardic with normal rhythm. Soft II/VI systolic murmur.  Lungs: No increased work of breathing. Clear to ausculation bilaterally. No wheezes, rhonchi, or rales.  Abdomen: Soft, non-distended, and non-tender to palpation.  Extremities: No lower extremity edema.    Skin: Warm and dry. Neuro: Alert and oriented x3. No focal deficits. Psych: Normal affect. Responds  appropriately.  Assessment:    1. Precordial chest pain   2. Coronary artery disease involving native coronary artery of native heart without angina pectoris   3. S/P CABG (coronary artery bypass graft)   4. Chronic combined systolic and diastolic CHF (congestive heart failure)   5. Ischemic cardiomyopathy   6. Aortic valve stenosis, etiology of cardiac valve disease unspecified   7. S/P AVR   8. Post-op atrial fibrillation   9. PAD (peripheral artery disease)   10. Bilateral carotid artery stenosis   11. Primary hypertension   12. Hyperlipidemia, unspecified hyperlipidemia type   13. Type 2 diabetes mellitus with complication, with long-term current use of insulin   14. Chronic anemia     Plan:    Atypical Chest Pain CAD s/p CABG History of stenting to left main, ostial LAD, and ostial CX. S/p recent CABG x2  (LIMA to LAD and SVG to OM1) on 04/03/2020 with Dr. Cliffton Asters. Repeat LHC in 08/2020 showed 95% stenosis of the ostial SVG to OM which was successfully treated with DES. However, LIMA to LAD was patent.  - She continues to have chest discomfort that she describes as a "heartburn" sensation. Sometimes occurs after meals; however, she also reports some "heartburn" with exertion.  - EKG shows nor acute ischemic changes. - Continue Plavix 75mg  daily.  - Continue beta-blocker and high-intensity statin. - Symptoms sound mostly atypical. Suspect it is likely due to GERD. She states she was previously on Protonix and seemed to do well on this. Will restart Protonix 40mg  daily. Discussed getting a cardiac PET if no improvement in symptoms with Protonix vs going  ahead and ordering now. Patient preferred to go ahead and order this now so will place order for cardiac PET today.   Shared Decision Making/Informed Consent{ The risks [chest pain, shortness of breath, cardiac arrhythmias, dizziness, blood pressure fluctuations, myocardial infarction, stroke/transient ischemic attack, nausea,  vomiting, allergic reaction, radiation exposure, metallic taste sensation and life-threatening complications (estimated to be 1 in 10,000)], benefits (risk stratification, diagnosing coronary artery disease, treatment guidance) and alternatives of a cardiac PET stress test were discussed in detail with Ms. Allensworth and she agrees to proceed.  Chronic Combined CHF Ischemic Cardiomyopathy Last Echo in 09/2021 showed LVEF of 40-45% with global hypokinesis, moderate LVH, and grade 2 diastolic dysfunction as well as low normal RV function and moderate MR. - Euvolemic on exam.  - Patient is prescribed Lasix 20mg  daily but states she only takes it about 3 times per week due to increased urination. Will decrease to 20mg  only as needed for weight gain (3lbs in 1 day or 5lbs in 1 week) or worsening lower extremity edema.  - Continue Entresto 24-26mg  twice daily. - Currently on Lopressor 37.5mg  twice daily. Will transition to Toprol-XL 75mg  daily given reduced EF.  - Continue Jardiance 10mg  daily.  - Will plan to add Spironolactone if renal function stable on labs today. - Discussed importance of daily weight and sodium/ fluid restrictions.  - Will check BMET today.   Aortic Stenosis s/p AVR  S/p AVR in 04/2020 at time of CABG. Last Echo in 09/2021 showed stable AVR with mean gradient of 20 mmHg.  - Continue SBE prophylaxis prior to any future dental work.    Post-Op Atrial Fibrillation Developed post-op atrial fibrillation following CABG/AVR. Converted back to sinus rhythm with Digoxin and increased in beta-blocker. Not on anticoagulation given no recurrence. - Maintaining sinus rhythm.  - Continue beta-blocker as above. - CHA2DS2-VASc = 6 (CAD, CHF, HTN, DM, age, gender). Not on anticoagulation given atrial fibrillation occurred in post-op setting and she has had no recurrence since then. If she does have any documented recurrence, will need to consider DOAC.   PAD S/p stenting to left common iliac  artery in 2019. Looks like she had repeat dopplers done in 12/2021 at [facility] but I cannot see the results of this. - She does described some pain in her calves when walking as well as throbbing pain in her right leg at night near prior vein graft harvesting site. - Continue Plavix and statin.  - Followed by Vascular Surgery at [facility]. Scheduled to see them next week.   Bilateral Carotid Artery Stenosis S/p stenting to left ICA. Carotid dopplers in 04/2020 showed  40-59% stenosis of right ICA and <50% stenosis of left ICA. Looks like she had repeat dopplers done in 12/2021 at [facility] but I cannot see the results of this. - Soft left sided bruit noted on exam. - Continue Plavix and statin. - Followed by Vascular Surgery at [facility]. Scheduled to see them next week.   Hypertension BP well controlled.  - Continue medications for CHF as above.  Hyperlipidemia Recent lipid panel on 07/15/2022: Total Cholesterol 161, Triglycerides 97, HDL 53, LDL 90. LDL goal <55. - Continue Crestor 40mg  daily and Zetia 10mg  daily. - Will refer to [facility] for consideration of PCSK9 inhibitor.    Type 2 Diabetes Hemoglobin A1c 6.4% in 04/2022 - Followed by Endocrinology at [facility].    Chronic Anemia with History  of GI Bleed History of GI bleed secondary to AVM. Hemoglobin stable at 11.4 in 05/2022.  - She is worried that her hemoglobin may be down again because she is very fatigued. - Will check CBC today. - Previously followed by GI and HemOnc at [facility].  Disposition: Follow up in 2-3 months.   Medication Adjustments/Labs and Tests Ordered: Current medicines are reviewed at length with the patient today.  Concerns regarding medicines are outlined above.  Orders Placed This Encounter  Procedures   NM PET CT CARDIAC PERFUSION MULTI W/ABSOLUTE BLOODFLOW   Basic metabolic panel   CBC   AMB Referral to  Heartcare Pharm-D   EKG 12-Lead   Meds ordered this encounter  Medications   metoprolol tartrate 75 MG TABS    Sig: Take 1 tablet (75 mg total) by mouth daily.    Dispense:  90 tablet    Refill:  3    Dose change new Rx    Patient Instructions  Medication Instructions:   START Metoprolol tartrate (Lopressor) 75 mg once a day  *If you need a refill on your cardiac medications before your next appointment, please call your pharmacy*  Lab Work: Your physician recommends that you have lab work TODAY:  BMP CBC  If you have labs (blood work) drawn today and your tests are completely normal, you will receive your results only by: MyChart Message (if you have MyChart) OR A paper copy in the mail If you have any lab test that is abnormal or we need to change your treatment, we will call you to review the results.   Testing/Procedures: CARDIAC PET- Your physician has requested that you have a Cardiac Pet Stress Test. This testing is completed at [facility] ([address] [place], [place] [address]). The schedulers will call you to get this scheduled. Please follow instructions below and call the office with any questions/concerns [phone]).    Follow-Up: At [facility], you and your health needs are our priority.  As part of our continuing mission to provide you with exceptional heart care, we have created designated Provider Care Teams.  These Care Teams include your primary Cardiologist (physician) and Advanced Practice Providers (APPs -  Physician Assistants and Nurse Practitioners) who all work together to provide you with the care you need, when you need it.  We recommend signing up for the patient portal called "MyChart".  Sign up information is provided on this After Visit Summary.  MyChart is used to connect with patients for Virtual Visits (Telemedicine).  Patients are able to view lab/test results, encounter notes, upcoming appointments,  etc.  Non-urgent messages can be sent to your provider as well.   To learn more about what you can do with MyChart, go to [email or web address].    Your next appointment:   2-3 month(s)  Provider:   Marjie Skiff, PA-C        Other Instructions  How to Prepare for Your Cardiac PET/CT Stress Test:  1. Please do not take these medications before your test: Metoprolol Tartrate (Lopressor) 75 mg   Medications that may interfere with the cardiac pharmacological stress agent (ex. nitrates - including erectile dysfunction medications, isosorbide mononitrate, tamulosin or beta-blockers) the day of the exam. (Erectile dysfunction medication should be held for at least 72 hrs prior to test) Theophylline containing medications for 12 hours. Dipyridamole 48 hours prior to the test. Your remaining medications may be taken with water.  2. Nothing to eat or drink, except  water, 3 hours prior to arrival time.   NO caffeine/decaffeinated products, or chocolate 12 hours prior to arrival.  3. NO perfume, cologne or lotion  4. Total time is 1 to 2 hours; you may want to bring reading material for the waiting time.  5. Please report to Radiology at the [facility] Entrance 30 minutes early for your test.  [address] [place] [address] [place], [place] [address]  Diabetic Preparation:  Hold oral medications. You may take NPH and Lantus insulin. Do not take Humalog or Humulin R (Regular Insulin) the day of your test. Check blood sugars prior to leaving the house. If able to eat breakfast prior to 3 hour fasting, you may take all medications, including your insulin, Do not worry if you miss your breakfast dose of insulin - start at your next meal.  IF YOU THINK YOU MAY BE PREGNANT, OR ARE NURSING PLEASE INFORM THE TECHNOLOGIST.  In preparation for your appointment, medication and supplies will be purchased.  Appointment availability is limited, so if you need to cancel or reschedule, please  call the Radiology Department at [phone]  24 hours in advance to avoid a cancellation fee of $100.00  What to Expect After you Arrive:  Once you arrive and check in for your appointment, you will be taken to a preparation room within the Radiology Department.  A technologist or Nurse will obtain your medical history, verify that you are correctly prepped for the exam, and explain the procedure.  Afterwards,  an IV will be started in your arm and electrodes will be placed on your skin for EKG monitoring during the stress portion of the exam. Then you will be escorted to the PET/CT scanner.  There, staff will get you positioned on the scanner and obtain a blood pressure and EKG.  During the exam, you will continue to be connected to the EKG and blood pressure machines.  A small, safe amount of a radioactive tracer will be injected in your IV to obtain a series of pictures of your heart along with an injection of a stress agent.    After your Exam:  It is recommended that you eat a meal and drink a caffeinated beverage to counter act any effects of the stress agent.  Drink plenty of fluids for the remainder of the day and urinate frequently for the first couple of hours after the exam.  Your doctor will inform you of your test results within 7-10 business days.  For questions about your test or how to prepare for your test, please call: Rockwell Alexandria, Cardiac Imaging Nurse Navigator  Larey Brick, Cardiac Imaging Nurse Navigator Office: [phone]     Signed, Smitty Knudsen  07/18/2022 6:58 PM    [facility]

## 2022-07-16 LAB — LIPID PANEL
Chol/HDL Ratio: 3 ratio (ref 0.0–4.4)
Cholesterol, Total: 161 mg/dL (ref 100–199)
HDL: 53 mg/dL (ref >39)
LDL Chol Calc (NIH): 90 mg/dL (ref 0–99)
Triglycerides: 97 mg/dL (ref 0–149)
VLDL Cholesterol Cal: 18 mg/dL (ref 5–40)

## 2022-07-18 ENCOUNTER — Encounter: Payer: Self-pay | Admitting: Student

## 2022-07-18 ENCOUNTER — Ambulatory Visit: Payer: 59 | Attending: Student | Admitting: Student

## 2022-07-18 VITALS — BP 130/74 | HR 65 | Ht 60.0 in | Wt 232.0 lb

## 2022-07-18 DIAGNOSIS — I739 Peripheral vascular disease, unspecified: Secondary | ICD-10-CM

## 2022-07-18 DIAGNOSIS — D649 Anemia, unspecified: Secondary | ICD-10-CM

## 2022-07-18 DIAGNOSIS — I6523 Occlusion and stenosis of bilateral carotid arteries: Secondary | ICD-10-CM

## 2022-07-18 DIAGNOSIS — I5042 Chronic combined systolic (congestive) and diastolic (congestive) heart failure: Secondary | ICD-10-CM | POA: Diagnosis not present

## 2022-07-18 DIAGNOSIS — I10 Essential (primary) hypertension: Secondary | ICD-10-CM

## 2022-07-18 DIAGNOSIS — I2510 Atherosclerotic heart disease of native coronary artery without angina pectoris: Secondary | ICD-10-CM | POA: Diagnosis not present

## 2022-07-18 DIAGNOSIS — Z951 Presence of aortocoronary bypass graft: Secondary | ICD-10-CM | POA: Diagnosis not present

## 2022-07-18 DIAGNOSIS — R072 Precordial pain: Secondary | ICD-10-CM | POA: Diagnosis not present

## 2022-07-18 DIAGNOSIS — I350 Nonrheumatic aortic (valve) stenosis: Secondary | ICD-10-CM

## 2022-07-18 DIAGNOSIS — Z952 Presence of prosthetic heart valve: Secondary | ICD-10-CM

## 2022-07-18 DIAGNOSIS — Z794 Long term (current) use of insulin: Secondary | ICD-10-CM

## 2022-07-18 DIAGNOSIS — E118 Type 2 diabetes mellitus with unspecified complications: Secondary | ICD-10-CM

## 2022-07-18 DIAGNOSIS — E785 Hyperlipidemia, unspecified: Secondary | ICD-10-CM

## 2022-07-18 DIAGNOSIS — I255 Ischemic cardiomyopathy: Secondary | ICD-10-CM

## 2022-07-18 DIAGNOSIS — I480 Paroxysmal atrial fibrillation: Secondary | ICD-10-CM

## 2022-07-18 MED ORDER — METOPROLOL TARTRATE 75 MG PO TABS: 75.0000 mg | ORAL_TABLET | Freq: Every day | ORAL | 3 refills | Status: DC

## 2022-07-18 MED ORDER — FUROSEMIDE 20 MG PO TABS: 20.0000 mg | ORAL_TABLET | Freq: Every day | ORAL | Status: DC | PRN

## 2022-07-18 NOTE — Patient Instructions (Addendum)
Medication Instructions:   START Metoprolol tartrate (Lopressor) 75 mg once a day  *If you need a refill on your cardiac medications before your next appointment, please call your pharmacy*  Lab Work: Your physician recommends that you have lab work TODAY:  BMP CBC  If you have labs (blood work) drawn today and your tests are completely normal, you will receive your results only by: MyChart Message (if you have MyChart) OR A paper copy in the mail If you have any lab test that is abnormal or we need to change your treatment, we will call you to review the results.   Testing/Procedures: CARDIAC PET- Your physician has requested that you have a Cardiac Pet Stress Test. This testing is completed at [facility] ([address] [place], [place] [address]). The schedulers will call you to get this scheduled. Please follow instructions below and call the office with any questions/concerns [phone]).    Follow-Up: At [facility], you and your health needs are our priority.  As part of our continuing mission to provide you with exceptional heart care, we have created designated Provider Care Teams.  These Care Teams include your primary Cardiologist (physician) and Advanced Practice Providers (APPs -  Physician Assistants and Nurse Practitioners) who all work together to provide you with the care you need, when you need it.  We recommend signing up for the patient portal called "MyChart".  Sign up information is provided on this After Visit Summary.  MyChart is used to connect with patients for Virtual Visits (Telemedicine).  Patients are able to view lab/test results, encounter notes, upcoming appointments, etc.  Non-urgent messages can be sent to your provider as well.   To learn more about what you can do with MyChart, go to [email or web address].    Your next appointment:   2-3 month(s)  Provider:   Marjie Skiff, PA-C        Other  Instructions  How to Prepare for Your Cardiac PET/CT Stress Test:  1. Please do not take these medications before your test: Metoprolol Tartrate (Lopressor) 75 mg   Medications that may interfere with the cardiac pharmacological stress agent (ex. nitrates - including erectile dysfunction medications, isosorbide mononitrate, tamulosin or beta-blockers) the day of the exam. (Erectile dysfunction medication should be held for at least 72 hrs prior to test) Theophylline containing medications for 12 hours. Dipyridamole 48 hours prior to the test. Your remaining medications may be taken with water.  2. Nothing to eat or drink, except water, 3 hours prior to arrival time.   NO caffeine/decaffeinated products, or chocolate 12 hours prior to arrival.  3. NO perfume, cologne or lotion  4. Total time is 1 to 2 hours; you may want to bring reading material for the waiting time.  5. Please report to Radiology at the [facility] Main Entrance 30 minutes early for your test.  [address] [place], [place] [address]  Diabetic Preparation:  Hold oral medications. You may take NPH and Lantus insulin. Do not take Humalog or Humulin R (Regular Insulin) the day of your test. Check blood sugars prior to leaving the house. If able to eat breakfast prior to 3 hour fasting, you may take all medications, including your insulin, Do not worry if you miss your breakfast dose of insulin - start at your next meal.  IF YOU THINK YOU MAY BE PREGNANT, OR ARE NURSING PLEASE INFORM THE TECHNOLOGIST.  In preparation for your appointment, medication and supplies  will be purchased.  Appointment availability is limited, so if you need to cancel or reschedule, please call the Radiology Department at [phone]  24 hours in advance to avoid a cancellation fee of $100.00  What to Expect After you Arrive:  Once you arrive and check in for your appointment, you will be taken to a preparation room within the  Radiology Department.  A technologist or Nurse will obtain your medical history, verify that you are correctly prepped for the exam, and explain the procedure.  Afterwards,  an IV will be started in your arm and electrodes will be placed on your skin for EKG monitoring during the stress portion of the exam. Then you will be escorted to the PET/CT scanner.  There, staff will get you positioned on the scanner and obtain a blood pressure and EKG.  During the exam, you will continue to be connected to the EKG and blood pressure machines.  A small, safe amount of a radioactive tracer will be injected in your IV to obtain a series of pictures of your heart along with an injection of a stress agent.    After your Exam:  It is recommended that you eat a meal and drink a caffeinated beverage to counter act any effects of the stress agent.  Drink plenty of fluids for the remainder of the day and urinate frequently for the first couple of hours after the exam.  Your doctor will inform you of your test results within 7-10 business days.  For questions about your test or how to prepare for your test, please call: Rockwell Alexandria, Cardiac Imaging Nurse Navigator  Larey Brick, Cardiac Imaging Nurse Navigator Office: [phone]

## 2022-07-19 ENCOUNTER — Telehealth: Payer: Self-pay | Admitting: *Deleted

## 2022-07-19 LAB — CBC
Hematocrit: 41.1 % (ref 34.0–46.6)
Hemoglobin: 12.7 g/dL (ref 11.1–15.9)
MCH: 25.4 pg — ABNORMAL LOW (ref 26.6–33.0)
MCHC: 30.9 g/dL — ABNORMAL LOW (ref 31.5–35.7)
MCV: 82 fL (ref 79–97)
Platelets: 201 10*3/uL (ref 150–450)
RBC: 5 x10E6/uL (ref 3.77–5.28)
RDW: 15.2 % (ref 11.7–15.4)
WBC: 9.4 10*3/uL (ref 3.4–10.8)

## 2022-07-19 LAB — BASIC METABOLIC PANEL
BUN/Creatinine Ratio: 20 (ref 12–28)
BUN: 16 mg/dL (ref 8–27)
CO2: 24 mmol/L (ref 20–29)
Calcium: 9.9 mg/dL (ref 8.7–10.3)
Chloride: 106 mmol/L (ref 96–106)
Creatinine, Ser: 0.79 mg/dL (ref 0.57–1.00)
Glucose: 90 mg/dL (ref 70–99)
Potassium: 4.4 mmol/L (ref 3.5–5.2)
Sodium: 145 mmol/L — ABNORMAL HIGH (ref 134–144)
eGFR: 82 mL/min/{1.73_m2} (ref >59)

## 2022-07-19 NOTE — Telephone Encounter (Signed)
Spoke with pt, she is currently taking rosuvastatin and ezetimibe. She agrees to an appointment with the pharmacist to discuss PCSK9. Follow up scheduled

## 2022-07-19 NOTE — Telephone Encounter (Signed)
-----   Message from Rollene Rotunda, MD sent at 07/18/2022  9:23 PM EDT ----- LDL is not at target.  I would suggest that she increase her Pravachol to 80 mg po daily.  Call Ms. Ebner with the results and send results to Cathey Endow, MD.  Repeat a lipid in 3 months.

## 2022-07-22 ENCOUNTER — Other Ambulatory Visit: Payer: Self-pay

## 2022-07-22 DIAGNOSIS — I5042 Chronic combined systolic (congestive) and diastolic (congestive) heart failure: Secondary | ICD-10-CM

## 2022-07-22 DIAGNOSIS — Z79899 Other long term (current) drug therapy: Secondary | ICD-10-CM

## 2022-07-22 MED ORDER — SPIRONOLACTONE 25 MG PO TABS: 12.5000 mg | ORAL_TABLET | Freq: Every day | ORAL | 3 refills | Status: DC

## 2022-07-30 LAB — BASIC METABOLIC PANEL
BUN/Creatinine Ratio: 20 (ref 12–28)
BUN: 16 mg/dL (ref 8–27)
CO2: 19 mmol/L — ABNORMAL LOW (ref 20–29)
Calcium: 10 mg/dL (ref 8.7–10.3)
Chloride: 105 mmol/L (ref 96–106)
Creatinine, Ser: 0.8 mg/dL (ref 0.57–1.00)
Glucose: 126 mg/dL — ABNORMAL HIGH (ref 70–99)
Potassium: 4.4 mmol/L (ref 3.5–5.2)
Sodium: 144 mmol/L (ref 134–144)
eGFR: 81 mL/min/{1.73_m2} (ref >59)

## 2022-07-31 ENCOUNTER — Encounter: Payer: Self-pay | Admitting: *Deleted

## 2022-08-23 ENCOUNTER — Institutional Professional Consult (permissible substitution): Payer: 59

## 2022-08-28 ENCOUNTER — Ambulatory Visit: Payer: 59 | Attending: Cardiology | Admitting: Pharmacist Clinician (PhC)/ Clinical Pharmacy Specialist

## 2022-08-28 ENCOUNTER — Encounter: Payer: Self-pay | Admitting: Pharmacist Clinician (PhC)/ Clinical Pharmacy Specialist

## 2022-08-28 DIAGNOSIS — E785 Hyperlipidemia, unspecified: Secondary | ICD-10-CM | POA: Diagnosis not present

## 2022-08-28 NOTE — Progress Notes (Signed)
Office Visit    Patient Name: Heidi Morse Date of Encounter: 08/28/2022  Primary Care Provider:  Cathey Endow, MD Primary Cardiologist:  Rollene Rotunda, MD  Chief Complaint    Hyperlipidemia   Significant Past Medical History   CAD 1/22 CABG x 2, 6/22 DES to SVG-OM1 graft  Aortic stenosis 1/22 AV replacement  CHF Chronic combined, EF 40-45% (by echo 7/23)  PAD 2019 L common iliac stenting, bilateral carotid stenosis w/L ICA stent  DM2 1/24 A1c 6.4 (high of 7.2) on Lantus, Humalog, jardiance  COPD/asthma On singulair, albuterol     Allergies  Allergen Reactions   Peanut Oil Swelling and Hives   Povidone-Iodine Other (See Comments)    Unknown reaction  PT STATES SHE HEARD HER DERMATOLOGIST MENTION THEY WERE GLAD THEY DID NOT USE THIS DURING A PROCEDURE, PT STATES SHE IS REALLY NOT SURE WHY.    Shellfish Allergy Other (See Comments)    Unknown reaction   Simvastatin     Other Reaction(s): Cramps (ALLERGY/intolerance), Other (See Comments)    myalgias Myalgias.    Myalgias.    myalgias   Statins Other (See Comments)    myalgia   Iodine Itching and Rash    History of Present Illness    Heidi Morse is a 67 y.o. female patient of Dr Antoine Poche, in the office today to discuss options for cholesterol management.  She has an extensive cardiac history and has had no problems with rosuvastatin.  Unfortunately, it is not enough (even with addition of ezetimibe) to get her to current guideline goals.    Insurance Carrier:  [employer] Dual complete  LDL Cholesterol goal:  LDL < 55  Current Medications:  rosuvastatin 40 mg qd, ezetimibe 10 mg qd  Previously tried:  simvastatin - myalgias  Family Hx:   mother had hypertension, died at 33; not sure about father; sister had strokes (deceased- cancer); 3 sons ( 1 deceased)  Social Hx: Tobacco:no - quit many years ago Alcohol:  only on rare occasions  Diet:  mix of home and eating out; not so much beef , likes cabbage,  greens, pintos, salads    Exercise: none  Adherence Assessment  Do you ever forget to take your medication? [] Yes [x] No  Do you ever skip doses due to side effects? [] Yes [x] No  Do you have trouble affording your medicines? [] Yes [x] No  Are you ever unable to pick up your medication due to transportation difficulties? [x] Yes - sometimes,  [] No  Do you ever stop taking your medications because you don't believe they are helping? [] Yes [x] No   Barriers to obtaining medications: transportation - will reach out to [facility] pharmacy regarding mail order service  Accessory Clinical Findings   Lab Results  Component Value Date   CHOL 161 07/15/2022   HDL 53 07/15/2022   LDLCALC 90 07/15/2022   TRIG 97 07/15/2022   CHOLHDL 3.0 07/15/2022    Lab Results  Component Value Date   ALT 15 10/13/2021   AST 15 10/13/2021   ALKPHOS 62 10/13/2021   BILITOT 0.6 10/13/2021   Lab Results  Component Value Date   CREATININE 0.80 07/30/2022   BUN 16 07/30/2022   NA 144 07/30/2022   K 4.4 07/30/2022   CL 105 07/30/2022   CO2 19 (L) 07/30/2022   Lab Results  Component Value Date   HGBA1C 6.8 (H) 10/13/2021    Home Medications    Current Outpatient Medications  Medication Sig Dispense Refill   albuterol (  VENTOLIN HFA) 108 (90 Base) MCG/ACT inhaler Inhale 2 puffs into the lungs every 6 (six) hours as needed for wheezing.     cholecalciferol (VITAMIN D3) 25 MCG (1000 UNIT) tablet Take 1,000 Units by mouth 2 (two) times daily.     clopidogrel (PLAVIX) 75 MG tablet TAKE 1 TABLET(75 MG) BY MOUTH DAILY WITH BREAKFAST 90 tablet 3   empagliflozin (JARDIANCE) 10 MG TABS tablet Take 1 tablet (10 mg total) by mouth daily before breakfast. 30 tablet 11   ezetimibe (ZETIA) 10 MG tablet Take 1 tablet (10 mg total) by mouth daily. 90 tablet 3   ferrous sulfate 325 (65 FE) MG tablet Take 325 mg by mouth 2 (two) times daily.     Fluticasone-Umeclidin-Vilant (TRELEGY ELLIPTA IN) Inhale 1 puff into the  lungs daily.     gabapentin (NEURONTIN) 300 MG capsule Take 300 mg by mouth See admin instructions. Take one capsule (300 mg) by mouth twice daily - may also take 2 more times during the day as needed for pain     HYDROcodone-acetaminophen (NORCO) 10-325 MG tablet Take 1 tablet by mouth every 4 (four) hours as needed (pain).     insulin glargine (LANTUS) 100 UNIT/ML Solostar Pen Inject 24 Units into the skin in the morning and at bedtime.     insulin lispro (HUMALOG) 100 UNIT/ML injection Inject 50 Units into the skin See admin instructions. Max of 50 units three times daily, pt is unable to provide exact sliding scale     metoprolol tartrate 75 MG TABS Take 1 tablet (75 mg total) by mouth daily. 90 tablet 3   montelukast (SINGULAIR) 10 MG tablet Take 10 mg by mouth every morning.     nitroGLYCERIN (NITROSTAT) 0.4 MG SL tablet PLACE 1 TABLET UNDER THE TONGUE EVERY 5 MINUTES AS NEEDED FOR CHEST PAIN. 25 tablet 3   pantoprazole (PROTONIX) 40 MG tablet Take 1 tablet (40 mg total) by mouth daily. 30 tablet 11   rosuvastatin (CRESTOR) 40 MG tablet TAKE 1 TABLET(40 MG) BY MOUTH DAILY 90 tablet 3   sacubitril-valsartan (ENTRESTO) 24-26 MG Take 1 tablet by mouth 2 (two) times daily. 60 tablet 6   solifenacin (VESICARE) 5 MG tablet Take 5 mg by mouth daily.     spironolactone (ALDACTONE) 25 MG tablet Take 0.5 tablets (12.5 mg total) by mouth daily. 15 tablet 3   valACYclovir (VALTREX) 500 MG tablet Take 500 mg by mouth daily as needed (for flare ups).     furosemide (LASIX) 20 MG tablet Take 1 tablet (20 mg total) by mouth daily as needed (weight gain (3lbs in 1 day or 5lbs in 1 week) or worsening lower extremity swelling). (Patient not taking: Reported on 08/28/2022)     No current facility-administered medications for this visit.     Assessment & Plan    Dyslipidemia Assessment: Patient with ASCVD not at LDL goal of < 55 Most recent LDL 90 on 07/15/22 Has been compliant with high intensity  statin/ezetimibe : rosuvastatin 40 mg qd, ezetimibe 10 mg qd Not able to tolerate simvastatin secondary to myalgias Reviewed  PCSK-9 inhibitors,and benefit to cholesterol lowering.  Discussed mechanisms of action, dosing, side effects, potential decreases in LDL cholesterol and costs.  Also reviewed potential options for patient assistance.  Plan: Patient agreeable to starting Repatha 140 mg q14d Repeat labs after:  3 months Lipid Liver function Cost should be $0, patient has Medicare/Medicaid dual   Phillips Hay, PharmD CPP [facility] [address]  [place], [place] [address] [phone]  08/28/2022, 4:21 PM

## 2022-08-28 NOTE — Patient Instructions (Signed)
Your Results:             Your most recent labs Goal  Total Cholesterol 161 < 200  Triglycerides 97 < 150  HDL (happy/good cholesterol) 53 > 40  LDL (lousy/bad cholesterol 90 < 55    Medication changes:  We will start the process to get Repatha covered by your insurance.  Once the prior authorization is complete, I will call/send a MyChart message to let you know and confirm pharmacy information.   You will take one injection every 14 days  Lab orders:  We want to repeat labs after 2-3 months.  We will send you a lab order to remind you once we get closer to that time.      Thank you for choosing [facility]

## 2022-08-28 NOTE — Assessment & Plan Note (Addendum)
Assessment: Patient with ASCVD not at LDL goal of < 55 Most recent LDL 90 on 07/15/22 Has been compliant with high intensity statin/ezetimibe : rosuvastatin 40 mg qd, ezetimibe 10 mg qd Not able to tolerate simvastatin secondary to myalgias Reviewed  PCSK-9 inhibitors,and benefit to cholesterol lowering.  Discussed mechanisms of action, dosing, side effects, potential decreases in LDL cholesterol and costs.  Also reviewed potential options for patient assistance.  Plan: Patient agreeable to starting Repatha 140 mg q14d Repeat labs after:  3 months Lipid Liver function Cost should be $0, patient has Medicare/Medicaid dual

## 2022-09-02 NOTE — Progress Notes (Signed)
Cardiology Office Note:    Date:  09/09/2022   ID:  Heidi Morse, DOB [birth year], MRN [id number]  PCP:  Cathey Endow, MD  Cardiologist:  Rollene Rotunda, MD  Electrophysiologist:  None   Referring MD: Cathey Endow, MD   Chief Complaint: follow-up of chest pain  History of Present Illness:    Heidi Morse is a 67 y.o. female with a history of CAD s/p PCI to left main/ostial LAD/ostial CX and recent CABG x2 (LIMA to LAD and SVG to OM1) in 04/2020 with subsequent DES to SVG-OM1 graft in 08/2020, aortic stenosis s/p AVR in 04/2020 at time of CABG, post-op atrial fibrillation not on anticoagulation, ischemic cardiomyopathy with chronic combined CHF and EF of 40-45% on Echo in 09/2021, PAD s/p stenting of left common iliac artery in 11/2017, bilateral carotid artery stenosis s/p stenting of left ICA, COPD/asthma followed by Pulmonology, obstructive sleep apnea, hypertension, hyperlipidemia, type 2 diabetes mellitus, GI bleeding requiring transfusions, chronic anemia, obesity, and chronic pain on narcotics who is a patient of Dr. Antoine Poche and presents today for follow-up of chest pain.   Patient was previously followed at [facility] for her cardiac care. She has had multiple cardiac catheterizations. From June 2020 to March 2021, she underwent 5 cardiac catheterization and was treated with stenting to left main, ostial LAD, and ostial CX. She was seen by CT surgery on 2 occasions but it was felt that her CAD could be better treated with PCI. She has had chronic chest pain some of which was felt to be non-cardiac in nature. She also has significant PAD of carotid arteries and lower extremities. She has undergone stenting to left internal carotid arteries as well as left common iliac artery in the past. She also history of GI bleeding and anemia. 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 She was recently admitted from 03/23/2020 to 03/24/2020 at [facility] for anemia/recent GI bleed  and chest pain. Troponin was negative and EKG was non-ischemic. Imaging was negative for active GI bleed. Therefore, she was discharged on Plavix and Aspirin. She returned to [facility] ED on 03/27/2020 for persistent chest pain relieved by Nitro. Work-up was unremarkable. It was again felt to be non-cardiac in origin. Therefore, she was discharged.    Family was frustrated with persistent pain so patient decided to come to [facility] on 03/28/2020 for second opinion and additional evaluation. On presentation , high-sensitivity troponin was negative and hemoglobin was 7.6. She received 1 unit of PRBC and GI was consulted. There was no active bleed and GI did not feel like any intervention was needed. Given significant cardiac history, Cardiology was consulted and decision was made to proceed with cardiac catheterizatoin. LHC showed patent left main stent with distal moderate restenosis. This stent extended into the LAD and there was haziness in the ostium of the LAD suggesting at least moderate restenosis of the stent. Thre was also severe restenosis of ostial CX stent. Echo showed LVEF of 45-50% with mild LVH, grade 2 diastolic dysfunction, mild to moderate AI, moderate AS, and moderate MR. CT surgery was consulted and patient ended up undergoing CABG x2 (LIMA to LAD and RSVG to OM1) as well as AVR on 04/03/2020 with Dr. Cliffton Asters. She developed post-op atrial fibrillation with RVR on 04/09/2020. Given iodine allergy, she was unable to be started on Amiodarone. Therefore, she was started on Digoxin and beta-blocker was increased. She ultimately converted back to sinus rhythm. She was not started on anticoagulation.  She had a repeat right/left cardiac catheterization in 08/2020 for further evaluation of increased dyspnea and this showed 95% stenosis of the ostial SVG to OM which was successfully treated with DES.    She underwent endoscopy in 11/2020 at [facility] which showed 3 bleeding areas in her duodenum and 1  bleeding area in the jejunum which were all treated with APC. She was later started on oral iron therapy by HemOnc.    She was admitted at [facility] in 08/2021 for a COPD exacerbation after presenting with shortness of breath. She was treated with a 5 day course of Prednisone and a 3 days course of Azithromycin. She was then readmitted in 09/2021 with chest pain. EKG showed no acute ischemic changes. High-sensitivity troponin was normal. Echo showed LVEF of 40-45% with global hypokinesis and grade 2 diastolic dysfunction. Chest pain resolved on its own She presented back to the [facility] ED in 09/2021 with shortness of breath and lower extremity edema. Venous dopplers were negative for DVT and symptoms improved with IV Lasix.  Patient was last seen by me in 07/2022 at which time she continued to report having chest pain that she described as heartburn and occurred mostly at rest. Symptoms sounded very atypical and she was restarted on Protonix. However, given persistent pain, a cardiac PET was ordered for further evaluation. GDMT was also adjusted (she was switched from short-acting to long-acting Metoprolol and started on Spironolactone).   Since last visit, she was seen in the ED at [facility] on 08/22/2022 for chest burning that is worse when laying down. Symptoms were felt to be due to GERD. Lipase was normal but it does not look like a troponin level was checked. She was prescribed Protonix (as it looks like the prescription was never sent after last visit).   Patient presents today for follow-up. She is here alone. She is doing better since I last saw her. She has not had her cardiac PET stress test yet but states her chest discomfort that she describes as "burning" has completely resolved with the Protonix. She reports an occasional very brief "pin-like" sensation in her chest that only last 1-2 seconds and then resolves which does not sounds like cardiac chest pain. She reports mild  shortness of breath with ambulating but this is improved from before. She has home O2 that she uses as needed but has not needed this. She denies any orthopnea, PND, or edema. Weight is stable around 230 to 232 lbs at home. No palpitations. She notes some lightheadedness/dizziness with quick position changes but this is very mild and does not last long. No syncope.  Past Medical History:  Diagnosis Date   Asthma    Bilateral carotid artery stenosis    CAD (coronary artery disease)    s/p CABG x2 in 04/2020   CHF (congestive heart failure) (HCC)    COPD (chronic obstructive pulmonary disease) (HCC)    Diabetes mellitus without complication (HCC)    Hypertension    MI (myocardial infarction) (HCC)    PAD (peripheral artery disease) (HCC)    Sleep apnea     Past Surgical History:  Procedure Laterality Date   AORTIC ROOT ENLARGEMENT N/A 04/03/2020   Procedure: AORTIC ROOT ENLARGEMENT USING HEMASHIELD PLATINUM WOVEN DOUBLE VELOUR VASCULAR GRAFT 28 MM X 30 CM;  Surgeon: Corliss Skains, MD;  Location: [facility] OR;  Service: Open Heart Surgery;  Laterality: N/A;   AORTIC VALVE REPLACEMENT N/A 04/03/2020   Procedure: CORONARY ARTERY  BYPASS GRAFTING X 2 ON CARDIOPULMONARY BYPASS. AORTIC VALVE REPLACEMENT USING 21 MM INSPIRIS RESILIA AORTIC VALVE, STERNAL PLATING;  Surgeon: Corliss Skains, MD;  Location: [facility] OR;  Service: Open Heart Surgery;  Laterality: N/A;  Coronary artery bypass grafting  Flow Trac   CORONARY STENT INTERVENTION N/A 09/12/2020   Procedure: CORONARY STENT INTERVENTION;  Surgeon: Corky Crafts, MD;  Location: [facility] INVASIVE CV LAB;  Service: Cardiovascular;  Laterality: N/A;   ENDOVEIN HARVEST OF GREATER SAPHENOUS VEIN Right 04/03/2020   Procedure: ENDOVEIN HARVEST OF GREATER SAPHENOUS VEIN;  Surgeon: Corliss Skains, MD;  Location: [facility] OR;  Service: Open Heart Surgery;  Laterality: Right;   IR RADIOLOGIST EVAL & MGMT  06/07/2021   LEFT HEART CATH AND CORONARY ANGIOGRAPHY N/A  03/30/2020   Procedure: LEFT HEART CATH AND CORONARY ANGIOGRAPHY;  Surgeon: Kathleene Hazel, MD;  Location: [facility] INVASIVE CV LAB;  Service: Cardiovascular;  Laterality: N/A;   RIGHT/LEFT HEART CATH AND CORONARY/GRAFT ANGIOGRAPHY N/A 09/12/2020   Procedure: RIGHT/LEFT HEART CATH AND CORONARY/GRAFT ANGIOGRAPHY;  Surgeon: Corky Crafts, MD;  Location: [facility] INVASIVE CV LAB;  Service: Cardiovascular;  Laterality: N/A;   TEE WITHOUT CARDIOVERSION N/A 04/03/2020   Procedure: TRANSESOPHAGEAL ECHOCARDIOGRAM (TEE);  Surgeon: Corliss Skains, MD;  Location: [facility] OR;  Service: Open Heart Surgery;  Laterality: N/A;    Current Medications: Current Meds  Medication Sig   albuterol (VENTOLIN HFA) 108 (90 Base) MCG/ACT inhaler Inhale 2 puffs into the lungs every 6 (six) hours as needed for wheezing.   cholecalciferol (VITAMIN D3) 25 MCG (1000 UNIT) tablet Take 1,000 Units by mouth 2 (two) times daily.   clopidogrel (PLAVIX) 75 MG tablet TAKE 1 TABLET(75 MG) BY MOUTH DAILY WITH BREAKFAST   empagliflozin (JARDIANCE) 10 MG TABS tablet Take 1 tablet (10 mg total) by mouth daily before breakfast.   Evolocumab (REPATHA SURECLICK) 140 MG/ML SOAJ Inject 140 mg into the skin every 14 (fourteen) days.   ferrous sulfate 325 (65 FE) MG tablet Take 325 mg by mouth 2 (two) times daily.   Fluticasone-Umeclidin-Vilant (TRELEGY ELLIPTA IN) Inhale 1 puff into the lungs daily.   gabapentin (NEURONTIN) 300 MG capsule Take 300 mg by mouth See admin instructions. Take one capsule (300 mg) by mouth twice daily - may also take 2 more times during the day as needed for pain   HYDROcodone-acetaminophen (NORCO) 10-325 MG tablet Take 1 tablet by mouth every 4 (four) hours as needed (pain).   insulin glargine (LANTUS) 100 UNIT/ML Solostar Pen Inject 24 Units into the skin in the morning and at bedtime.   insulin lispro (HUMALOG) 100 UNIT/ML injection Inject 50 Units into the skin See admin instructions.  sliding scale    metoprolol tartrate 75 MG TABS Take 1 tablet (75 mg total) by mouth daily.   montelukast (SINGULAIR) 10 MG tablet Take 10 mg by mouth every morning.   pantoprazole (PROTONIX) 40 MG tablet Take 1 tablet (40 mg total) by mouth daily.   sacubitril-valsartan (ENTRESTO) 49-51 MG Take 1 tablet by mouth 2 (two) times daily.   solifenacin (VESICARE) 5 MG tablet Take 5 mg by mouth daily.   spironolactone (ALDACTONE) 25 MG tablet Take 0.5 tablets (12.5 mg total) by mouth daily.   valACYclovir (VALTREX) 500 MG tablet Take 500 mg by mouth daily as needed (for flare ups).   [DISCONTINUED] ezetimibe (ZETIA) 10 MG tablet Take 1 tablet (10 mg total) by mouth daily.   [DISCONTINUED] nitroGLYCERIN (NITROSTAT) 0.4 MG SL tablet PLACE 1  TABLET UNDER THE TONGUE EVERY 5 MINUTES AS NEEDED FOR CHEST PAIN.   [DISCONTINUED] rosuvastatin (CRESTOR) 40 MG tablet TAKE 1 TABLET(40 MG) BY MOUTH DAILY   [DISCONTINUED] sacubitril-valsartan (ENTRESTO) 24-26 MG Take 1 tablet by mouth 2 (two) times daily.     Allergies:   Peanut oil, Povidone-iodine, Shellfish allergy, Simvastatin, Statins, and Iodine   Social History   Socioeconomic History   Marital status: Single    Spouse name: Not on file   Number of children: Not on file   Years of education: Not on file   Highest education level: Not on file  Occupational History   Not on file  Tobacco Use   Smoking status: Former    Types: Cigarettes   Smokeless tobacco: Never  Vaping Use   Vaping Use: Never used  Substance and Sexual Activity   Alcohol use: Not Currently   Drug use: Not Currently   Sexual activity: Not on file  Other Topics Concern   Not on file  Social History Narrative   Not on file   Social Determinants of Health   Financial Resource Strain: Not on file  Food Insecurity: Not on file  Transportation Needs: Not on file  Physical Activity: Not on file  Stress: Not on file  Social Connections: Not on file     Family History: The patient's family  history is not on file.  ROS:   Please see the history of present illness.     EKGs/Labs/Other Studies Reviewed:    The following studies were reviewed:  Right/ Left Cardiac Catheterization 09/12/2020: Dist LM to Ost LAD lesion is 60% stenosed. LIMA to LAD is widely patent. Prox RCA to Mid RCA lesion is 40% stenosed. Could not selectively engage from the left radial but appeared patent. Ost Cx lesion is 99% stenosed. In stent restenosis. Origin lesion is 95% stenosed in the SVG to OM. A drug-eluting stent was successfully placed using a SYNERGY XD 3.50X20, postdilated to > 4 mm. Post intervention, there is a 0% residual stenosis. Hemodynamic findings consistent with mild pulmonary hypertension. Ao sat 98%; PA sat 71%; PA pressure 40/15, mean PA 33 mm Hg; mean PCWP 15 mm Hg; CO 8.47 L/min; CI 4.3 A drug-eluting stent was successfully placed using a SYNERGY XD 3.50X20.   Successful PCI of the ostial vein graft to OM.  Given her issues with a slow GI bleed, will hold aspirin and use Plavix monotherapy.  She will have further work-up at [facility] with double-balloon enteroscopy.  We will continue iron supplementation.  We will have to base need for transfusion on hemoglobin in the morning.  Baseline hemoglobin of 9.2 and gradually trending down over the last few months.   Diagnostic Dominance: Right  Intervention     _______________   Echocardiogram 10/13/2021: Impressions: 1. Left ventricular ejection fraction, by estimation, is 40 to 45%. The  left ventricle has mildly decreased function. The left ventricle  demonstrates global hypokinesis. There is moderate left ventricular  hypertrophy. Left ventricular diastolic  parameters are consistent with Grade II diastolic dysfunction  (pseudonormalization). Elevated left atrial pressure.   2. Right ventricular systolic function is low normal. The right  ventricular size is normal. There is normal pulmonary artery systolic  pressure.   3. Left  atrial size was mildly dilated.   4. The mitral valve is abnormal. Moderate mitral valve regurgitation. No  evidence of mitral stenosis.   5. The tricuspid valve is abnormal. Tricuspid valve regurgitation is  moderate.  6. 21 MM INSPIRIS RESILIA AORTIC VALVE is in the AV position. Stable mean  gradient 20 mmHg. . The aortic valve has been repaired/replaced. There is  mild calcification of the aortic valve. There is mild thickening of the  aortic valve. Aortic valve  regurgitation is not visualized. There is a valve present in the aortic  position. Procedure Date: 04/2020.   7. The inferior vena cava is normal in size with greater than 50%  respiratory variability, suggesting right atrial pressure of 3 mmHg.   EKG:  EKG not ordered today.   Recent Labs: 10/12/2021: B Natriuretic Peptide 160.7 10/13/2021: ALT 15; Magnesium 1.8 07/18/2022: Hemoglobin 12.7; Platelets 201 07/30/2022: BUN 16; Creatinine, Ser 0.80; Potassium 4.4; Sodium 144  Recent Lipid Panel    Component Value Date/Time   CHOL 161 07/15/2022 0904   TRIG 97 07/15/2022 0904   HDL 53 07/15/2022 0904   CHOLHDL 3.0 07/15/2022 0904   CHOLHDL 4.7 10/13/2021 0254   VLDL 21 10/13/2021 0254   LDLCALC 90 07/15/2022 0904    Physical Exam:    Vital Signs: BP (!) 140/72   Pulse 64   Ht 5' (1.524 m)   Wt 232 lb (105.2 kg)   LMP 04/01/2000 (Approximate)   SpO2 99%   BMI 45.31 kg/m     Wt Readings from Last 3 Encounters:  09/09/22 232 lb (105.2 kg)  07/18/22 232 lb (105.2 kg)  05/08/22 236 lb (107 kg)     General: 67 y.o. African-American female in no acute distress. HEENT: Normocephalic and atraumatic. Sclera clear.  Neck: Supple. Carotid bruit noted bilaterally. No JVD. Heart: RRR. II/VI systolic murmur. No rubs or gallops.  Lungs: No increased work of breathing. Clear to ausculation bilaterally. No wheezes, rhonchi, or rales.  Abdomen: Soft, non-distended, and non-tender to palpation.  Extremities: Trace lower  extremity edema bilaterally.    Skin: Warm and dry. Neuro: Alert and oriented x3. No focal deficits. Psych: Normal affect. Responds appropriately.  Assessment:    1. Atypical chest pain   2. Coronary artery disease involving native coronary artery of native heart without angina pectoris   3. S/P CABG (coronary artery bypass graft)   4. Chronic combined systolic and diastolic CHF (congestive heart failure) (HCC)   5. Ischemic cardiomyopathy   6. Aortic valve stenosis, etiology of cardiac valve disease unspecified   7. S/P AVR   8. Post-op atrial fibrillation   9. PAD (peripheral artery disease) (HCC)   10. Bilateral carotid artery stenosis   11. Primary hypertension   12. Hyperlipidemia, unspecified hyperlipidemia type   13. Type 2 diabetes mellitus with complication, with long-term current use of insulin (HCC)   14. Medication management     Plan:    Atypical Chest Pain CAD s/p CABG History of stenting to left main, ostial LAD, and ostial CX. S/p recent CABG x2  (LIMA to LAD and SVG to OM1) on 04/03/2020 with Dr. Cliffton Asters. Repeat LHC in 08/2020 showed 95% stenosis of the ostial SVG to OM which was successfully treated with DES. However, LIMA to LAD was patent. She has had chest pain that she has described as heartburn for the last several months. Protonix was prescribed and a cardiac PET was ordered at last visit but has not been done yet.  - Heartburn has completely resolved after starting Protonix. No angina. - Continue Plavix 75mg  daily.  - Continue beta-blocker and high-intensity statin. - Prior symptoms seem to clearly be due to GERD and have resolved with Protonix.  After shared decision making, will cancel cardiac PET as symptoms not felt to be cardiac in nature. Patient will let us know if she had any recurrent chest pain.   Chronic Combined CHF Ischemic Cardiomyopathy Last Echo in 09/2021 showed LVEF of 40-45% with global hypokinesis, moderate LVH, and grade 2 diastolic  dysfunction as well as low normal RV function and moderate MR. - Euvolemic on exam.  - Will increase Entresto to 49-51mg  twice daily for additional BP control. - Continue Toprol-XL 75mg  daily.  - Continue Spironolactone 12.5mg  daily.  - Continue Jardiance 10mg  daily.  - Continue daily weights and sodium restrictions.  - Will repeat BMET in 1 week.    Aortic Stenosis s/p AVR  S/p AVR in 04/2020 at time of CABG. Last Echo in 09/2021 showed stable AVR with mean gradient of 20 mmHg.  - Continue SBE prophylaxis prior to any future dental work.  - Will repeat Echo in about 3 months.   Post-Op Atrial Fibrillation Developed post-op atrial fibrillation following CABG/AVR. Converted back to sinus rhythm with Digoxin and increased in beta-blocker. Not on anticoagulation given no recurrence. - Maintaining sinus rhythm on exam.  - Continue beta-blocker as above. - CHA2DS2-VASc = 6 (CAD, CHF, HTN, DM, age, gender). Not on anticoagulation given atrial fibrillation occurred in post-op setting and she has had no recurrence since then. If she does have any documented recurrence, will need to consider DOAC.   PAD S/p stenting to left common iliac artery in 2019. Most recent lower extremity dopplers in 07/2022 showed ABI of 0.87 on the right and incompressible vessel on the left.  - Continue Plavix and statin.  - Followed by Vascular Surgery at [facility].    Bilateral Carotid Artery Stenosis S/p stenting to left ICA. Carotid dopplers in 04/2020 showed  40-59% stenosis of right ICA and <50% stenosis of left ICA. Looks like she had repeat dopplers done in 12/2021 at [facility] but I cannot see the results of this. - Continue Plavix and statin. - Followed by Vascular Surgery at [facility].    Hypertension BP elevated. Initially 158/76 and then 140/72 on my personal recheck at the end of visit.  - Continue medications for CHF. Will increase Entresto as above. - Will  repeat BMET in 1 week.    Hyperlipidemia Recent lipid panel on 07/15/2022: Total Cholesterol 161, Triglycerides 97, HDL 53, LDL 90. LDL goal <55. - Continue Crestor 40mg  daily and Zetia 10mg  daily. - She was referred to the [facility] at last visit and was recently started on Repatha. This recently got approved by insurance but prescription has not been sent to her Pharmacy yet. Will follow-up with PharmD.  Type 2 Diabetes Hemoglobin A1c 6.4% in 04/2022 - Followed by Endocrinology at [facility].   Disposition: Follow up in 4 months.   Medication Adjustments/Labs and Tests Ordered: Current medicines are reviewed at length with the patient today.  Concerns regarding medicines are outlined above.  Orders Placed This Encounter  Procedures   Basic metabolic panel   ECHOCARDIOGRAM COMPLETE   Meds ordered this encounter  Medications   DISCONTD: nitroGLYCERIN (NITROSTAT) 0.4 MG SL tablet    Sig: PLACE 1 TABLET UNDER THE TONGUE EVERY 5 MINUTES AS NEEDED FOR CHEST PAIN.    Dispense:  25 tablet    Refill:  3   ezetimibe (ZETIA) 10 MG tablet    Sig: Take 1 tablet (10 mg total) by mouth daily.  Dispense:  90 tablet    Refill:  3   rosuvastatin (CRESTOR) 40 MG tablet    Sig: TAKE 1 TABLET(40 MG) BY MOUTH DAILY    Dispense:  90 tablet    Refill:  3    ZERO refills remain on this prescription. Your patient is requesting advance approval of refills for this medication to PREVENT ANY MISSED DOSES   sacubitril-valsartan (ENTRESTO) 49-51 MG    Sig: Take 1 tablet by mouth 2 (two) times daily.    Dispense:  60 tablet    Refill:  3   Evolocumab (REPATHA SURECLICK) 140 MG/ML SOAJ    Sig: Inject 140 mg into the skin every 14 (fourteen) days.    Dispense:  6 mL    Refill:  1    Patient Instructions  Medication Instructions:   INCREASE Entresto to 49 mg-51 mg 2 times a day-May take 2 of the 24 mg-26 mg tablets until out and pick up new prescription for ENTRESTO 49 mg-51  mg  *If you need a refill on your cardiac medications before your next appointment, please call your pharmacy*  Lab Work: Your physician recommends that you return for lab work in 1 week:  BMP  If you have labs (blood work) drawn today and your tests are completely normal, you will receive your results only by: MyChart Message (if you have MyChart) OR A paper copy in the mail If you have any lab test that is abnormal or we need to change your treatment, we will call you to review the results.  Testing/Procedures: Your physician has requested that you have an echocardiogram. Echocardiography is a painless test that uses sound waves to create images of your heart. It provides your doctor with information about the size and shape of your heart and how well your heart's chambers and valves are working. This procedure takes approximately one hour. There are no restrictions for this procedure. Please do NOT wear cologne, perfume, aftershave, or lotions (deodorant is allowed). Please arrive 15 minutes prior to your appointment time.  Please scheduled for 3 months  Follow-Up: At [facility], you and your health needs are our priority.  As part of our continuing mission to provide you with exceptional heart care, we have created designated Provider Care Teams.  These Care Teams include your primary Cardiologist (physician) and Advanced Practice Providers (APPs -  Physician Assistants and Nurse Practitioners) who all work together to provide you with the care you need, when you need it.   Your next appointment:   4-5 month(s)  Provider:   Rollene Rotunda, MD  or Marjie Skiff, PA-C        Other Instructions     Signed, Corrin Parker, PA-C  09/09/2022 5:08 PM    [facility]

## 2022-09-05 ENCOUNTER — Other Ambulatory Visit (HOSPITAL_COMMUNITY): Payer: Self-pay

## 2022-09-05 ENCOUNTER — Telehealth: Payer: Self-pay

## 2022-09-05 ENCOUNTER — Telehealth: Payer: Self-pay | Admitting: Pharmacist Clinician (PhC)/ Clinical Pharmacy Specialist

## 2022-09-05 NOTE — Telephone Encounter (Signed)
Please do PA for Repatha 

## 2022-09-05 NOTE — Telephone Encounter (Signed)
Pharmacy Patient Advocate Encounter   Received notification from [facility] that prior authorization for REPATHA 140MG /ML is required/requested.   PA submitted to [facility] via CoverMyMeds Key or (Medicaid) confirmation # [id number]  Status is pending

## 2022-09-06 NOTE — Telephone Encounter (Signed)
Pharmacy Patient Advocate Encounter  Prior Authorization for REPATHA 140MG /ML has been APPROVED by [employer] from 6.6.24 to 12.6.24.

## 2022-09-09 ENCOUNTER — Other Ambulatory Visit: Payer: Self-pay

## 2022-09-09 ENCOUNTER — Encounter: Payer: Self-pay | Admitting: Student

## 2022-09-09 ENCOUNTER — Ambulatory Visit: Payer: 59 | Attending: Student | Admitting: Student

## 2022-09-09 VITALS — BP 140/72 | HR 64 | Ht 60.0 in | Wt 232.0 lb

## 2022-09-09 DIAGNOSIS — I2510 Atherosclerotic heart disease of native coronary artery without angina pectoris: Secondary | ICD-10-CM

## 2022-09-09 DIAGNOSIS — R0789 Other chest pain: Secondary | ICD-10-CM | POA: Diagnosis not present

## 2022-09-09 DIAGNOSIS — I480 Paroxysmal atrial fibrillation: Secondary | ICD-10-CM

## 2022-09-09 DIAGNOSIS — Z951 Presence of aortocoronary bypass graft: Secondary | ICD-10-CM

## 2022-09-09 DIAGNOSIS — I10 Essential (primary) hypertension: Secondary | ICD-10-CM

## 2022-09-09 DIAGNOSIS — Z794 Long term (current) use of insulin: Secondary | ICD-10-CM

## 2022-09-09 DIAGNOSIS — E118 Type 2 diabetes mellitus with unspecified complications: Secondary | ICD-10-CM

## 2022-09-09 DIAGNOSIS — I255 Ischemic cardiomyopathy: Secondary | ICD-10-CM

## 2022-09-09 DIAGNOSIS — I5042 Chronic combined systolic (congestive) and diastolic (congestive) heart failure: Secondary | ICD-10-CM

## 2022-09-09 DIAGNOSIS — Z952 Presence of prosthetic heart valve: Secondary | ICD-10-CM

## 2022-09-09 DIAGNOSIS — I6523 Occlusion and stenosis of bilateral carotid arteries: Secondary | ICD-10-CM

## 2022-09-09 DIAGNOSIS — I739 Peripheral vascular disease, unspecified: Secondary | ICD-10-CM

## 2022-09-09 DIAGNOSIS — I350 Nonrheumatic aortic (valve) stenosis: Secondary | ICD-10-CM

## 2022-09-09 DIAGNOSIS — Z79899 Other long term (current) drug therapy: Secondary | ICD-10-CM

## 2022-09-09 DIAGNOSIS — E785 Hyperlipidemia, unspecified: Secondary | ICD-10-CM

## 2022-09-09 MED ORDER — NITROGLYCERIN 0.4 MG SL SUBL: SUBLINGUAL_TABLET | SUBLINGUAL | 11 refills | Status: AC

## 2022-09-09 MED ORDER — EZETIMIBE 10 MG PO TABS: 10.0000 mg | ORAL_TABLET | Freq: Every day | ORAL | 3 refills | Status: DC

## 2022-09-09 MED ORDER — REPATHA SURECLICK 140 MG/ML ~~LOC~~ SOAJ: 1.0000 mL | SUBCUTANEOUS | 1 refills | Status: DC

## 2022-09-09 MED ORDER — ROSUVASTATIN CALCIUM 40 MG PO TABS: ORAL_TABLET | ORAL | 3 refills | Status: DC

## 2022-09-09 MED ORDER — NITROGLYCERIN 0.4 MG SL SUBL: SUBLINGUAL_TABLET | SUBLINGUAL | 3 refills | Status: DC

## 2022-09-09 MED ORDER — ENTRESTO 49-51 MG PO TABS: 1.0000 | ORAL_TABLET | Freq: Two times a day (BID) | ORAL | 3 refills | Status: DC

## 2022-09-09 NOTE — Patient Instructions (Addendum)
Medication Instructions:   INCREASE Entresto to 49 mg-51 mg 2 times a day-May take 2 of the 24 mg-26 mg tablets until out and pick up new prescription for ENTRESTO 49 mg-51 mg  *If you need a refill on your cardiac medications before your next appointment, please call your pharmacy*  Lab Work: Your physician recommends that you return for lab work in 1 week:  BMP  If you have labs (blood work) drawn today and your tests are completely normal, you will receive your results only by: MyChart Message (if you have MyChart) OR A paper copy in the mail If you have any lab test that is abnormal or we need to change your treatment, we will call you to review the results.  Testing/Procedures: Your physician has requested that you have an echocardiogram. Echocardiography is a painless test that uses sound waves to create images of your heart. It provides your doctor with information about the size and shape of your heart and how well your heart's chambers and valves are working. This procedure takes approximately one hour. There are no restrictions for this procedure. Please do NOT wear cologne, perfume, aftershave, or lotions (deodorant is allowed). Please arrive 15 minutes prior to your appointment time.  Please scheduled for 3 months  Follow-Up: At [facility], you and your health needs are our priority.  As part of our continuing mission to provide you with exceptional heart care, we have created designated Provider Care Teams.  These Care Teams include your primary Cardiologist (physician) and Advanced Practice Providers (APPs -  Physician Assistants and Nurse Practitioners) who all work together to provide you with the care you need, when you need it.   Your next appointment:   4-5 month(s)  Provider:   Rollene Rotunda, MD  or Marjie Skiff, PA-C        Other Instructions

## 2022-09-10 NOTE — Telephone Encounter (Signed)
See other encounter.

## 2022-09-10 NOTE — Telephone Encounter (Signed)
Prescription sent to pharmacy.  Will repeat labs in 3 months

## 2022-09-19 LAB — BASIC METABOLIC PANEL
BUN/Creatinine Ratio: 23 (ref 12–28)
BUN: 16 mg/dL (ref 8–27)
CO2: 19 mmol/L — ABNORMAL LOW (ref 20–29)
Calcium: 9.2 mg/dL (ref 8.7–10.3)
Chloride: 109 mmol/L — ABNORMAL HIGH (ref 96–106)
Creatinine, Ser: 0.7 mg/dL (ref 0.57–1.00)
Glucose: 116 mg/dL — ABNORMAL HIGH (ref 70–99)
Potassium: 4 mmol/L (ref 3.5–5.2)
Sodium: 146 mmol/L — ABNORMAL HIGH (ref 134–144)
eGFR: 95 mL/min/{1.73_m2} (ref >59)

## 2022-09-20 ENCOUNTER — Telehealth: Payer: Self-pay

## 2022-09-20 NOTE — Telephone Encounter (Addendum)
Left voice message for patient to give office a call back for results.   ----- Message from Corrin Parker, PA-C sent at 09/19/2022  7:45 AM EDT ----- Please cal and notify patient of results: Overall labs look stable. Kidney function and potassium level normal. Sodium and chloride levels are slightly elevated. Looking back in her chart, they have been intermittently elevated in the past. This is sometimes due to dehydration. Patient should make sure she is staying well hydrated especially in the summer heat. Continue current medications.  Thank you!

## 2022-10-16 ENCOUNTER — Ambulatory Visit (HOSPITAL_COMMUNITY): Payer: 59 | Attending: Student

## 2022-10-21 ENCOUNTER — Other Ambulatory Visit: Payer: Self-pay | Admitting: Student

## 2022-10-29 ENCOUNTER — Other Ambulatory Visit: Payer: Self-pay | Admitting: Cardiology

## 2022-10-29 NOTE — Telephone Encounter (Signed)
New Message:      Patient said she made a mistake today and threw away her Clopidogrel and Jardiance.    *STAT* If patient is at the pharmacy, call can be transferred to refill team.   1. Which medications need to be refilled? (please list name of each medication and dose if known) Jardiance and Clopidogrel   2. Would you like to learn more about the convenience, safety, & potential cost savings by using the [facility]?      3. Are you open to using the [facility] (Type [facility].   4. Which pharmacy/location (including street and city if local pharmacy) is medication to be sent to? [facility]  [address]   5. Do they need a 30 day or 90 day supply?

## 2022-10-31 ENCOUNTER — Encounter (HOSPITAL_COMMUNITY): Payer: Self-pay

## 2022-10-31 ENCOUNTER — Ambulatory Visit (HOSPITAL_COMMUNITY)
Admission: RE | Admit: 2022-10-31 | Discharge: 2022-10-31 | Disposition: A | Discharge status: Home / Self Care | Payer: 59 | Source: Ambulatory Visit | Attending: Student | Admitting: Student

## 2022-10-31 DIAGNOSIS — J449 Chronic obstructive pulmonary disease, unspecified: Secondary | ICD-10-CM | POA: Insufficient documentation

## 2022-10-31 DIAGNOSIS — I252 Old myocardial infarction: Secondary | ICD-10-CM | POA: Diagnosis not present

## 2022-10-31 DIAGNOSIS — I5042 Chronic combined systolic (congestive) and diastolic (congestive) heart failure: Secondary | ICD-10-CM | POA: Insufficient documentation

## 2022-10-31 DIAGNOSIS — I2510 Atherosclerotic heart disease of native coronary artery without angina pectoris: Secondary | ICD-10-CM | POA: Diagnosis not present

## 2022-10-31 DIAGNOSIS — I110 Hypertensive heart disease with heart failure: Secondary | ICD-10-CM | POA: Diagnosis not present

## 2022-10-31 DIAGNOSIS — Z951 Presence of aortocoronary bypass graft: Secondary | ICD-10-CM | POA: Insufficient documentation

## 2022-10-31 DIAGNOSIS — E119 Type 2 diabetes mellitus without complications: Secondary | ICD-10-CM | POA: Insufficient documentation

## 2022-10-31 DIAGNOSIS — G4730 Sleep apnea, unspecified: Secondary | ICD-10-CM | POA: Insufficient documentation

## 2022-10-31 DIAGNOSIS — I350 Nonrheumatic aortic (valve) stenosis: Secondary | ICD-10-CM | POA: Insufficient documentation

## 2022-10-31 LAB — ECHOCARDIOGRAM COMPLETE
AR max vel: 0.85 cm2
AV Area VTI: 0.84 cm2
AV Area mean vel: 0.81 cm2
AV Mean grad: 27.3 mmHg
AV Peak grad: 48.3 mmHg
Ao pk vel: 3.47 m/s
Area-P 1/2: 3.6 cm2
Calc EF: 52 %
S' Lateral: 3.1 cm
Single Plane A2C EF: 54.7 %
Single Plane A4C EF: 49.2 %

## 2022-10-31 NOTE — Progress Notes (Signed)
Echocardiogram 2D Echocardiogram has been performed.  Heidi Morse 10/31/2022, 10:58 AM

## 2022-11-06 NOTE — Progress Notes (Signed)
Cardiology Clinic Note   Patient Name: Heidi Morse Date of Encounter: 11/08/2022  Primary Care Provider:  Cathey Endow, MD Primary Cardiologist:  Rollene Rotunda, MD  Patient Profile    Heidi Morse 67 year old female presents to the clinic today for follow-up evaluation of her shortness of breath.  Past Medical History    Past Medical History:  Diagnosis Date   Asthma    Bilateral carotid artery stenosis    CAD (coronary artery disease)    s/p CABG x2 in 04/2020   CHF (congestive heart failure) (HCC)    COPD (chronic obstructive pulmonary disease) (HCC)    Diabetes mellitus without complication (HCC)    Hypertension    MI (myocardial infarction) (HCC)    PAD (peripheral artery disease) (HCC)    Sleep apnea    Past Surgical History:  Procedure Laterality Date   AORTIC ROOT ENLARGEMENT N/A 04/03/2020   Procedure: AORTIC ROOT ENLARGEMENT USING HEMASHIELD PLATINUM WOVEN DOUBLE VELOUR VASCULAR GRAFT 28 MM X 30 CM;  Surgeon: Corliss Skains, MD;  Location: [facility] OR;  Service: Open Heart Surgery;  Laterality: N/A;   AORTIC VALVE REPLACEMENT N/A 04/03/2020   Procedure: CORONARY ARTERY BYPASS GRAFTING X 2 ON CARDIOPULMONARY BYPASS. AORTIC VALVE REPLACEMENT USING 21 MM INSPIRIS RESILIA AORTIC VALVE, STERNAL PLATING;  Surgeon: Corliss Skains, MD;  Location: [facility] OR;  Service: Open Heart Surgery;  Laterality: N/A;  Coronary artery bypass grafting Flow Trac   CORONARY STENT INTERVENTION N/A 09/12/2020   Procedure: CORONARY STENT INTERVENTION;  Surgeon: Corky Crafts, MD;  Location: [facility] INVASIVE CV LAB;  Service: Cardiovascular;  Laterality: N/A;   ENDOVEIN HARVEST OF GREATER SAPHENOUS VEIN Right 04/03/2020   Procedure: ENDOVEIN HARVEST OF GREATER SAPHENOUS VEIN;  Surgeon: Corliss Skains, MD;  Location: [facility] OR;  Service: Open Heart Surgery;  Laterality: Right;   IR RADIOLOGIST EVAL & MGMT  06/07/2021   LEFT HEART CATH AND CORONARY ANGIOGRAPHY N/A 03/30/2020    Procedure: LEFT HEART CATH AND CORONARY ANGIOGRAPHY;  Surgeon: Kathleene Hazel, MD;  Location: [facility] INVASIVE CV LAB;  Service: Cardiovascular;  Laterality: N/A;   RIGHT/LEFT HEART CATH AND CORONARY/GRAFT ANGIOGRAPHY N/A 09/12/2020   Procedure: RIGHT/LEFT HEART CATH AND CORONARY/GRAFT ANGIOGRAPHY;  Surgeon: Corky Crafts, MD;  Location: [facility] INVASIVE CV LAB;  Service: Cardiovascular;  Laterality: N/A;   TEE WITHOUT CARDIOVERSION N/A 04/03/2020   Procedure: TRANSESOPHAGEAL ECHOCARDIOGRAM (TEE);  Surgeon: Corliss Skains, MD;  Location: [facility] OR;  Service: Open Heart Surgery;  Laterality: N/A;    Allergies  Allergies  Allergen Reactions   Peanut Oil Swelling and Hives   Povidone-Iodine Other (See Comments)    Unknown reaction  PT STATES SHE HEARD HER DERMATOLOGIST MENTION THEY WERE GLAD THEY DID NOT USE THIS DURING A PROCEDURE, PT STATES SHE IS REALLY NOT SURE WHY.    Shellfish Allergy Other (See Comments)    Unknown reaction   Simvastatin     Other Reaction(s): Cramps (ALLERGY/intolerance), Other (See Comments)    myalgias Myalgias.    Myalgias.    myalgias   Statins Other (See Comments)    myalgia   Iodine Itching and Rash    History of Present Illness    Heidi Morse has a PMH of coronary artery disease status post CABG, aortic stenosis status post AVR, postoperative atrial fibrillation, ischemic cardiomyopathy, peripheral arterial disease status post left common femoral artery stenting 9/19, bilateral carotid artery disease status post left ICA stenting, hypertension, hyperlipidemia, diabetes mellitus type 2, OSA,  and COPD.  She underwent endoscopy 12/21 which showed bleeding AVMs.  She was admitted at [facility] for anemia and GI bleed.  She was evaluated at [facility] emergency department 12/20 for chest pain.  Her troponins were negative, hemoglobin 7.6, and she received 1 unit of PRBCs.  GI was consulted.  She underwent left heart cath which showed a patent left  main stent and distal moderate restenosis, severe restenosis of the ostial left circumflex stent.  Her echocardiogram showed an EF of 45-50%, mild LVH, G2 DD, mild-moderate AI, moderate aortic stenosis and moderate MR.  She was seen and evaluated by CT surgery and underwent CABG x2 with LIMA-LAD, and SVG-OM as well as AVR by Dr. Cliffton Asters on 04/03/2020.  Her postoperative course was complicated by atrial fibrillation with RVR.  She was unable to tolerate amiodarone due to [place] allergies.  She was started on digoxin and beta-blocker therapy.  She converted to sinus rhythm was started on anticoagulation at that time.  She had recurrent anginal symptoms and increased shortness of breath.  She underwent repeat cardiac catheterization 6/22 which showed patent LIMA-LAD, 60% distal left main, 40% proximal mid RCA, 99% ostial left circumflex, 95% ostial lesion in her SVG-OM and received DES x1.  She followed up with Dr. Antoine Poche 7/22.  Her anemia was being followed by [facility].  She is not on aspirin due to GI bleed.  She was taking Plavix monotherapy.  She had been reviewed for preoperative clearance for endoscopy.  However due to recent stent placement Dr. Antoine Poche did not recommend holding Plavix until December 2022.  She underwent endoscopy 12/22/2020 at [facility] which revealed 3 bleeding areas in her duodenum which were treated with APC, a single bleeding area in her jejunum also treated with APC.  She follows with hematology oncology Dr. Allison Quarry at [facility].  She had been placed on oral iron therapy.  She was seen by Azalee Course PA-C on 01/25/2021.  During that time she was noticing worsening dyspnea on exertion.  She had fallen 2 days prior at the supermarket and bumped her left knee and right shoulder.  She was not sure why she fell.  Her blood work obtained in October showed a hemoglobin had increased to 11.6.  Due to her significant history of anemia repeat blood work including CBC, TSH and BMP were ordered.  Her lungs  were clear and she did not have any significant lower extremity swelling.  She reported compliance with her Lipitor, Plavix, and Lopressor.  Her EKG showed no acute changes.  Follow-up was planned for 3 weeks.  Her blood work remained stable with a hemoglobin of 12.2.  All of her other labs from 01/28/2021 were unremarkable.  She presented to the clinic 03/01/21  for follow-up evaluation stated she had been increasing her physical activity.  She had been doing linen changes, mopping, and other house chores.  She was somewhat limited in her physical activity due to back pain and knee pain.  She was working with pain management.  Her breathing continued to get better.  We reviewed her lab work from her previous visit and she expressed understanding.  I  asked her to continue to lose weight, increase her physical activity as tolerated, and follow-up in 3-4 months.  She was last seen by Marjie Skiff, PA-C 09/09/2022.  She reported that she had been to [facility] on 08/22/2022 for chest burning that was worse with laying down.  It was explained that her symptoms were  related to acid reflux.  Her lipase was normal.  She was prescribed Protonix.  And follow-up she presented alone.  She was doing much better.  She had not had her cardiac PET stress testing.  She reported that her chest burning had completely resolved with Protonix treatment.  She did note an occasional very brief bandlike sensation that would last for 1-2 seconds and then resolve.  She reported mild shortness of breath with ambulation however, this is also improved.  She was using home O2 as needed.  She did not orthopnea, PND, and lower extremity swelling.  Her weight was stable around 230-232 pounds.  She denied palpitations.  She did note some lightheadedness/dizziness with quick position changes.  This was very mild.  She denies syncope.  Her Sherryll Burger was increased to 49-51.  Echocardiogram 10/31/2022 showed normal LVEF, G1 DD, and trivial  mitral valve regurgitation.  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  She presents to the clinic today for follow-up evaluation and states she continues to notice intermittent brief episodes of chest discomfort.  She describes the pain as sharp.  It last for 1 to 2 seconds and then dissipates on its own.  We reviewed her most recent echocardiogram.  She expressed understanding.  I will continue her current medication regimen and have asked her to increase her physical activity as tolerated.  Her breathing remains stable.  She is using oxygen as needed.  I will plan follow-up in 6 months.  Today she denies chest pain, increased shortness of breath, lower extremity edema, fatigue, palpitations, melena, hematuria, hemoptysis, diaphoresis, weakness, presyncope, syncope, orthopnea, and PND.   Home Medications    Prior to Admission medications   Medication Sig Start Date End Date Taking? Authorizing Provider  albuterol (VENTOLIN HFA) 108 (90 Base) MCG/ACT inhaler Inhale 2 puffs into the lungs every 6 (six) hours as needed for wheezing. 03/09/20   [provider]  amoxicillin (AMOXIL) 500 MG capsule Take 4 capsules (2,000 mg total) by mouth as needed (before dental work or other procedures). Patient not taking: Reported on 01/25/2021 09/14/20 09/14/21  Barrett, Joline Salt, PA-C  atorvastatin (LIPITOR) 80 MG tablet Take 80 mg by mouth at bedtime. 03/09/20   [provider]  Capsaicin-Menthol (SALONPAS GEL-PATCH HOT EX) Apply 1 patch topically 2 (two) times daily. Knee pain    [provider]  cholecalciferol (VITAMIN D3) 25 MCG (1000 UNIT) tablet Take 1,000 Units by mouth 2 (two) times daily.    [provider]  clopidogrel (PLAVIX) 75 MG tablet Take 1 tablet (75 mg total) by mouth daily with breakfast. 12/29/20   Rollene Rotunda, MD  empagliflozin (JARDIANCE) 10 MG TABS tablet Take 1 tablet (10 mg total)  by mouth daily before breakfast. Patient not taking: Reported on 01/25/2021 09/14/20   Barrett, Joline Salt, PA-C  ferrous sulfate 325 (65 FE) MG tablet Take 325 mg by mouth 2 (two) times daily. 03/24/20   [provider]  Fluticasone-Salmeterol (ADVAIR) 250-50 MCG/DOSE AEPB Inhale 1 puff into the lungs in the morning and at bedtime. 01/27/20   [provider]  gabapentin (NEURONTIN) 300 MG capsule Take 300 mg by mouth See admin instructions. Take one capsule (300 mg) by mouth twice daily - may also take 2 more times during the day as needed for pain 02/01/20   [provider]  guaiFENesin (MUCINEX) 600 MG 12 hr tablet Take by mouth.    [provider]  HYDROcodone-acetaminophen (NORCO) 10-325 MG tablet Take 1 tablet by mouth every 4 (four) hours as needed (pain).    [provider]  hydrocortisone 2.5 % lotion Apply topically 2 (two) times daily. 10/07/20   Placido Sou, PA-C  ibuprofen (ADVIL) 800 MG tablet Take 800 mg by mouth every 6 (six) hours as needed. 09/28/20   [provider]  insulin aspart (NOVOLOG) 100 UNIT/ML FlexPen Inject 7-14 Units into the skin in the morning and at bedtime. Dose based on CBG    [provider]  insulin glargine (LANTUS) 100 UNIT/ML Solostar Pen Inject 24 Units into the skin 2 (two) times daily as needed (CBG >150).    [provider]  metoprolol tartrate (LOPRESSOR) 25 MG tablet TAKE 1 AND 1/2 TABLETS BY MOUTH TWICE DAILY 01/18/21   Rollene Rotunda, MD  montelukast (SINGULAIR) 10 MG tablet Take 10 mg by mouth every morning. 01/05/20   [provider]  nitroGLYCERIN (NITROSTAT) 0.4 MG SL tablet PLACE 1 TABLET UNDER THE TONGUE EVERY 5 MINUTES AS NEEDED FOR CHEST PAIN. Patient not taking: Reported on 01/25/2021 11/27/20   Rollene Rotunda, MD  OXYGEN Inhale into the lungs as needed (shortness of breath).    [provider]  pantoprazole (PROTONIX) 40 MG tablet Take 1 tablet (40 mg  total) by mouth daily. Patient not taking: Reported on 01/25/2021 11/27/20   Rollene Rotunda, MD  valACYclovir (VALTREX) 500 MG tablet Take 500 mg by mouth 2 (two) times daily as needed (for flare ups).    [provider]    Family History    History reviewed. No pertinent family history. has no family status information on file.    Social History    Social History   Socioeconomic History   Marital status: Single    Spouse name: Not on file   Number of children: Not on file   Years of education: Not on file   Highest education level: Not on file  Occupational History   Not on file  Tobacco Use   Smoking status: Former    Types: Cigarettes   Smokeless tobacco: Never  Vaping Use   Vaping status: Never Used  Substance and Sexual Activity   Alcohol use: Not Currently   Drug use: Not Currently   Sexual activity: Not on file  Other Topics Concern   Not on file  Social History Narrative   Not on file   Social Determinants of Health   Financial Resource Strain: Not on file  Food Insecurity: Not on file  Transportation Needs: Not on file  Physical Activity: Not on file  Stress: Not on file  Social Connections: Not on file  Intimate Partner Violence: Not on file     Review of Systems    General:  No chills, fever, night sweats or weight changes.  Cardiovascular:  No chest pain, dyspnea on exertion, edema, orthopnea, palpitations, paroxysmal nocturnal dyspnea. Dermatological: No rash, lesions/masses Respiratory: No cough, dyspnea Urologic: No hematuria, dysuria Abdominal:   No nausea, vomiting, diarrhea, bright red blood per rectum, melena, or hematemesis Neurologic:  No visual changes, wkns, changes in mental status. All other systems reviewed and are otherwise negative except as noted above.  Physical Exam    VS:  BP 122/69   Pulse 65   Ht 5' (1.524 m)   Wt 232 lb (105.2 kg)   LMP 04/01/2000 (Approximate)   SpO2 99%   BMI 45.31 kg/m  , BMI Body mass  index is 45.31  kg/m. GEN: Well nourished, well developed, in no acute distress. HEENT: normal. Neck: Supple, no JVD, carotid bruits, or masses. Cardiac: RRR, no murmurs, rubs, or gallops. No clubbing, cyanosis, edema.  Radials/DP/PT 2+ and equal bilaterally.  Respiratory:  Respirations regular and unlabored, clear to auscultation bilaterally. GI: Soft, nontender, nondistended, BS + x 4. MS: no deformity or atrophy. Skin: warm and dry, no rash. Neuro:  Strength and sensation are intact. Psych: Normal affect.  Accessory Clinical Findings    Recent Labs: 07/18/2022: Hemoglobin 12.7; Platelets 201 09/18/2022: BUN 16; Creatinine, Ser 0.70; Potassium 4.0; Sodium 146   Recent Lipid Panel    Component Value Date/Time   CHOL 161 07/15/2022 0904   TRIG 97 07/15/2022 0904   HDL 53 07/15/2022 0904   CHOLHDL 3.0 07/15/2022 0904   CHOLHDL 4.7 10/13/2021 0254   VLDL 21 10/13/2021 0254   LDLCALC 90 07/15/2022 0904    ECG personally reviewed by me today-none today.  Echocardiogram 08/14/2020  IMPRESSIONS     1. Left ventricular ejection fraction, by estimation, is 50 to 55%. Left  ventricular ejection fraction by 3D volume is 51 %. The left ventricle has  low normal function. The left ventricle has no regional wall motion  abnormalities. There is mild  concentric left ventricular hypertrophy. Left ventricular diastolic  parameters are consistent with Grade II diastolic dysfunction  (pseudonormalization). Elevated left atrial pressure.   2. Right ventricular systolic function is mildly reduced. The right  ventricular size is normal. There is mildly elevated pulmonary artery  systolic pressure. The estimated right ventricular systolic pressure is  42.2 mmHg.   3. The mitral valve is grossly normal. Mild to moderate mitral valve  regurgitation. No evidence of mitral stenosis.   4. A 21mm Inspiris Resilia aortic valve bioprosthesis is present. There  is stable patient-prosthesis mismatch  with AT 92, DI 0.33, EOA 1.05 iEOA  0.53. Mean gradient . There is trivial paravalvular leak. . The  aortic valve has been  repaired/replaced. There is a 21 mm Inspiris bioprosthetic valve present  in the aortic position. Procedure Date: 04/03/20.   5. Patient is s/p aortic root enlargement. The appearance of the aortic  root and ascending aorta appears stable.   6. The inferior vena cava is normal in size with greater than 50%  respiratory variability, suggesting right atrial pressure of 3 mmHg.   Comparison(s): Compared to prior TTE on 05/23/20, there is no significant  change.   Cardiac catheterization 09/12/2020 Dist LM to Ost LAD lesion is 60% stenosed. LIMA to LAD is widely patent. Prox RCA to Mid RCA lesion is 40% stenosed. Could not selectively engage from the left radial but appeared patent. Ost Cx lesion is 99% stenosed. In stent restenosis. Origin lesion is 95% stenosed in the SVG to OM. A drug-eluting stent was successfully placed using a SYNERGY XD 3.50X20, postdilated to > 4 mm. Post intervention, there is a 0% residual stenosis. Hemodynamic findings consistent with mild pulmonary hypertension. Ao sat 98%; PA sat 71%; PA pressure 40/15, mean PA 33 mm Hg; mean PCWP 15 mm Hg; CO 8.47 L/min; CI 4.3 A drug-eluting stent was successfully placed using a SYNERGY XD 3.50X20.   Successful PCI of the ostial vein graft to OM.  Given her issues with a slow GI bleed, will hold aspirin and use Plavix monotherapy.  She will have further work-up at [facility] with double-balloon enteroscopy.  We will continue iron supplementation.  We will have to base need for transfusion on hemoglobin  in the morning.  Baseline hemoglobin of 9.2 and gradually trending down over the last few months.  Echocardiogram 10/31/2022  IMPRESSIONS     1. Left ventricular ejection fraction, by estimation, is 60 to 65%. The  left ventricle has normal function. The left ventricle has no regional  wall motion  abnormalities. Left ventricular diastolic parameters are  consistent with Grade I diastolic  dysfunction (impaired relaxation). The average left ventricular global  longitudinal strain is -14.0 %. The global longitudinal strain is  abnormal.   2. Right ventricular systolic function is mildly reduced. The right  ventricular size is normal. There is normal pulmonary artery systolic  pressure.   3. Trivial mitral valve regurgitation.   4. No significant PVL. V max 3.4 m/s. Mean gradient 27 mmHg. DI 0.24,  EOAi 0.4. The aortic valve was not well visualized. Aortic valve  regurgitation is not visualized. There is a 21 mm INSPIRIS RESILIA valve  present in the aortic position. Procedure  Date: 04/03/20.   5. The inferior vena cava is normal in size with greater than 50%  respiratory variability, suggesting right atrial pressure of 3 mmHg.   Conclusion(s)/Recommendation(s): Known patient prosthesis mismatch, mildly  worse compared to prior echo.   FINDINGS   Left Ventricle: Left ventricular ejection fraction, by estimation, is 60  to 65%. The left ventricle has normal function. The left ventricle has no  regional wall motion abnormalities. The average left ventricular global  longitudinal strain is -14.0 %.  The global longitudinal strain is abnormal. The left ventricular internal  cavity size was normal in size. There is no left ventricular hypertrophy.  Abnormal (paradoxical) septal motion consistent with post-operative  status. Left ventricular diastolic  parameters are consistent with Grade I diastolic dysfunction (impaired  relaxation).   Right Ventricle: The right ventricular size is normal. Right ventricular  systolic function is mildly reduced. There is normal pulmonary artery  systolic pressure. The tricuspid regurgitant velocity is 2.49 m/s, and  with an assumed right atrial pressure of   3 mmHg, the estimated right ventricular systolic pressure is 27.8 mmHg.   Left Atrium:  Left atrial size was normal in size.   Right Atrium: Right atrial size was normal in size.   Pericardium: There is no evidence of pericardial effusion.   Mitral Valve: Trivial mitral valve regurgitation.   Tricuspid Valve: Tricuspid valve regurgitation is not demonstrated.   Aortic Valve: No significant PVL. V max 3.4 m/s. Mean gradient 27 mmHg. DI  0.24, EOAi 0.4. The aortic valve was not well visualized. Aortic valve  regurgitation is not visualized. Aortic valve mean gradient measures 27.3  mmHg. Aortic valve peak gradient  measures 48.3 mmHg. Aortic valve area, by VTI measures 0.84 cm. There is  a 21 mm INSPIRIS RESILIA valve present in the aortic position. Procedure  Date: 04/03/20.   Pulmonic Valve: Pulmonic valve regurgitation is mild to moderate.   Aorta: The aortic root and ascending aorta are structurally normal, with  no evidence of dilitation.   Venous: The inferior vena cava is normal in size with greater than 50%  respiratory variability, suggesting right atrial pressure of 3 mmHg.   IAS/Shunts: No atrial level shunt detected by color flow Doppler.    Assessment & Plan   1.  Status post AVR-breathing stable no increased activity intolerance.  Echocardiogram 10/31/2022 showed normal LVEF, trivial mitral valve regurgitation, and replaced aortic valve with a mean gradient of 27 mmHg, no aortic valve regurgitation, and mildly worse pressure gradients  compared to previous year. Continue to monitor  Shortness of breath-stable.  Remains somewhat physically active.  Significant history of anemia and coronary artery disease.  Follows with [facility] for GI and [facility] for hematology oncology.  Presented to [facility] emergency department with burning type chest discomfort.  Was prescribed pantoprazole and symptoms resolved.  No plans for ischemic evaluation at this time. Increase physical activity as tolerated Continue current medical therapy Heart healthy low-sodium  diet  Coronary artery disease-no chest pain today.  Status post CABG x2.  Underwent cardiac catheterization and received DES x1 to SVG-OM.  Details above.  On Plavix monotherapy due to GI bleeding. Continue Plavix Heart healthy low-sodium diet Increase physical activity as tolerated  Essential hypertension-BP today 122/69.  Maintain blood pressure log Continue metoprolol Heart healthy low-sodium diet Increase physical activity as tolerated  Hyperlipidemia-07/15/2022: Cholesterol, Total 161; HDL 53; LDL Chol Calc (NIH) 90; Triglycerides 97 Continue atorvastatin Heart healthy low-sodium high-fiber diet Increase physical activity as tolerate   Disposition: Follow-up with Dr. Antoine Poche or APP in 4-6 months.  Thomasene Ripple. Keighan Amezcua NP-C    11/08/2022, 3:43 PM [facility] [address] Office [phone] Fax [phone]  Notice: This dictation was prepared with Dragon dictation along with smaller phrase technology. Any transcriptional errors that result from this process are unintentional and may not be corrected upon review.  I spent 13 minutes examining this patient, reviewing medications, and using patient centered shared decision making involving her cardiac care.  Prior to her visit I spent greater than 20 minutes reviewing her past medical history,  medications, and prior cardiac tests.

## 2022-11-08 ENCOUNTER — Encounter: Payer: Self-pay | Admitting: General Practice

## 2022-11-08 ENCOUNTER — Ambulatory Visit: Payer: 59 | Attending: General Practice | Admitting: General Practice

## 2022-11-08 VITALS — BP 122/69 | HR 65 | Ht 60.0 in | Wt 232.0 lb

## 2022-11-08 DIAGNOSIS — I2510 Atherosclerotic heart disease of native coronary artery without angina pectoris: Secondary | ICD-10-CM | POA: Diagnosis not present

## 2022-11-08 DIAGNOSIS — E785 Hyperlipidemia, unspecified: Secondary | ICD-10-CM

## 2022-11-08 DIAGNOSIS — R0602 Shortness of breath: Secondary | ICD-10-CM

## 2022-11-08 DIAGNOSIS — I350 Nonrheumatic aortic (valve) stenosis: Secondary | ICD-10-CM

## 2022-11-08 DIAGNOSIS — I10 Essential (primary) hypertension: Secondary | ICD-10-CM

## 2022-11-08 DIAGNOSIS — Z952 Presence of prosthetic heart valve: Secondary | ICD-10-CM

## 2022-11-08 NOTE — Patient Instructions (Signed)
Medication Instructions:  The current medical regimen is effective;  continue present plan and medications as directed. Please refer to the Current Medication list given to you today.  *If you need a refill on your cardiac medications before your next appointment, please call your pharmacy*  Lab Work: NONE If you have labs (blood work) drawn today and your tests are completely normal, you will receive your results only by:  MyChart Message (if you have MyChart) OR  A paper copy in the mail If you have any lab test that is abnormal or we need to change your treatment, we will call you to review the results.  Testing/Procedures: NONE  Other Instructions INCREASE PHYSICAL ACTIVITY-AS TOLERATED  Follow-Up: At [facility], you and your health needs are our priority.  As part of our continuing mission to provide you with exceptional heart care, we have created designated Provider Care Teams.  These Care Teams include your primary Cardiologist (physician) and Advanced Practice Providers (APPs -  Physician Assistants and Nurse Practitioners) who all work together to provide you with the care you need, when you need it.  We recommend signing up for the patient portal called "MyChart".  Sign up information is provided on this After Visit Summary.  MyChart is used to connect with patients for Virtual Visits (Telemedicine).  Patients are able to view lab/test results, encounter notes, upcoming appointments, etc.  Non-urgent messages can be sent to your provider as well.   To learn more about what you can do with MyChart, go to [email or web address].    Your next appointment:   4-6 month(s)  Provider:   Rollene Rotunda, MD

## 2022-11-18 ENCOUNTER — Telehealth: Payer: Self-pay | Admitting: Pharmacist Clinician (PhC)/ Clinical Pharmacy Specialist

## 2022-11-18 DIAGNOSIS — E785 Hyperlipidemia, unspecified: Secondary | ICD-10-CM

## 2022-11-18 NOTE — Telephone Encounter (Signed)
Lab order for lipid f/u mailed to patient

## 2022-11-27 LAB — HEPATIC FUNCTION PANEL
ALT: 13 IU/L (ref 0–32)
AST: 12 IU/L (ref 0–40)
Albumin: 4.2 g/dL (ref 3.9–4.9)
Alkaline Phosphatase: 84 IU/L (ref 44–121)
Bilirubin Total: <0.2 mg/dL (ref 0.0–1.2)
Bilirubin, Direct: <0.1 mg/dL (ref 0.00–0.40)
Total Protein: 6.8 g/dL (ref 6.0–8.5)

## 2022-11-27 LAB — LIPID PANEL
Chol/HDL Ratio: 2.9 ratio (ref 0.0–4.4)
Cholesterol, Total: 157 mg/dL (ref 100–199)
HDL: 55 mg/dL (ref >39)
LDL Chol Calc (NIH): 85 mg/dL (ref 0–99)
Triglycerides: 92 mg/dL (ref 0–149)
VLDL Cholesterol Cal: 17 mg/dL (ref 5–40)

## 2022-11-29 ENCOUNTER — Other Ambulatory Visit: Payer: Self-pay | Admitting: Pharmacist Clinician (PhC)/ Clinical Pharmacy Specialist

## 2022-11-29 DIAGNOSIS — I739 Peripheral vascular disease, unspecified: Secondary | ICD-10-CM

## 2022-11-29 DIAGNOSIS — Z951 Presence of aortocoronary bypass graft: Secondary | ICD-10-CM

## 2022-11-29 DIAGNOSIS — I2510 Atherosclerotic heart disease of native coronary artery without angina pectoris: Secondary | ICD-10-CM

## 2022-11-29 DIAGNOSIS — R0789 Other chest pain: Secondary | ICD-10-CM

## 2022-11-29 MED ORDER — REPATHA SURECLICK 140 MG/ML ~~LOC~~ SOAJ
1.0000 mL | SUBCUTANEOUS | 3 refills | Status: DC
Start: 2022-11-29 — End: 2023-10-20

## 2022-12-15 ENCOUNTER — Other Ambulatory Visit: Payer: Self-pay | Admitting: Cardiology

## 2022-12-17 ENCOUNTER — Telehealth: Payer: Self-pay | Admitting: Pharmacist Clinician (PhC)/ Clinical Pharmacy Specialist

## 2022-12-17 ENCOUNTER — Other Ambulatory Visit: Payer: Self-pay | Admitting: Pharmacist Clinician (PhC)/ Clinical Pharmacy Specialist

## 2022-12-17 MED ORDER — ENTRESTO 49-51 MG PO TABS: 1.0000 | ORAL_TABLET | Freq: Two times a day (BID) | ORAL | 0 refills | Status: DC

## 2022-12-17 MED ORDER — EMPAGLIFLOZIN 10 MG PO TABS: 10.0000 mg | ORAL_TABLET | Freq: Every day | ORAL | Status: AC

## 2022-12-17 MED ORDER — EMPAGLIFLOZIN 10 MG PO TABS: 10.0000 mg | ORAL_TABLET | Freq: Every day | ORAL | 3 refills | Status: DC

## 2022-12-17 MED ORDER — EMPAGLIFLOZIN 10 MG PO TABS: 10.0000 mg | ORAL_TABLET | Freq: Every day | ORAL | 0 refills | Status: DC

## 2022-12-17 MED ORDER — ENTRESTO 49-51 MG PO TABS: 1.0000 | ORAL_TABLET | Freq: Two times a day (BID) | ORAL | 3 refills | Status: DC

## 2022-12-17 NOTE — Telephone Encounter (Signed)
patient is requesting to speak with pharmacist about her medication entresto

## 2022-12-17 NOTE — Telephone Encounter (Signed)
Pt states having trouble with local pharmacy filling wrong Entresto Rx.  24/26 instead of 49/51.  When called pharmacy, pharmacist on duty told me patient requested they cancel 49/51 mg dose.  Sent new rx for 49/51 and requested all previous Entresto rx's be cancelled

## 2022-12-31 ENCOUNTER — Telehealth: Payer: Self-pay | Admitting: Cardiology

## 2022-12-31 MED ORDER — ROSUVASTATIN CALCIUM 40 MG PO TABS: ORAL_TABLET | ORAL | 3 refills | Status: DC

## 2022-12-31 NOTE — Telephone Encounter (Signed)
Pt's medication was sent to pt's pharmacy as requested. Confirmation received.  °

## 2022-12-31 NOTE — Telephone Encounter (Signed)
*  STAT* If patient is at the pharmacy, call can be transferred to refill team.   1. Which medications need to be refilled? (please list name of each medication and dose if known) rosuvastatin (CRESTOR) 40 MG tablet    2. Would you like to learn more about the convenience, safety, & potential cost savings by using the [facility]? No     3. Are you open to using the [facility] (Type [facility]. No ).   4. Which pharmacy/location (including street and city if local pharmacy) is medication to be sent to? [id number] - [place], [place] - [address] AT [facility] OF [address]    5. Do they need a 30 day or 90 day supply? 90

## 2023-01-09 ENCOUNTER — Ambulatory Visit: Payer: 59 | Admitting: Cardiology

## 2023-03-11 NOTE — Progress Notes (Unsigned)
Cardiology Office Note:   Date:  03/11/2023  ID:  Heidi Morse, DOB [birth year], MRN [id number] PCP: Cathey Endow, MD  [facility] Providers Cardiologist:  Rollene Rotunda, MD {  History of Present Illness:   Heidi Morse is a 67 y.o. female has a PMH of coronary artery disease status post CABG, aortic stenosis status post AVR, postoperative atrial fibrillation, ischemic cardiomyopathy, peripheral arterial disease status post left common femoral artery stenting, bilateral carotid artery disease status post left ICA stenting, hypertension, hyperlipidemia, diabetes mellitus type 2, OSA, and COPD.    She presents for follow up.  Her last cardiac cath was 2022 and she had 95% stenosis of a SVG to OM that required stenting.   She was seen a couple of times earlier this year with chest pain.   She was seen at [facility] and thought to have a GI etiology.  Most recently she had an echo which demonstrated NL AVR function.  EF was normal.  ***     She underwent endoscopy 12/21 which showed bleeding AVMs.  She was admitted at [facility] for anemia and GI bleed.  She was evaluated at [facility] emergency department 12/20 for chest pain.  Her troponins were negative, hemoglobin 7.6, and she received 1 unit of PRBCs.  GI was consulted.  She underwent left heart cath which showed a patent left main stent and distal moderate restenosis, severe restenosis of the ostial left circumflex stent.  Her echocardiogram showed an EF of 45-50%, mild LVH, G2 DD, mild-moderate AI, moderate aortic stenosis and moderate MR.  She was seen and evaluated by CT surgery and underwent CABG x2 with LIMA-LAD, and SVG-OM as well as AVR by Dr. Cliffton Asters on 04/03/2020.  Her postoperative Morse was complicated by atrial fibrillation with RVR.  She was unable to tolerate amiodarone due to [place] allergies.  She was started on digoxin and beta-blocker therapy.  She converted to sinus rhythm was started on anticoagulation at that time.    She had recurrent anginal symptoms and increased shortness of breath.  She underwent repeat cardiac catheterization 6/22 which showed patent LIMA-LAD, 60% distal left main, 40% proximal mid RCA, 99% ostial left circumflex, 95% ostial lesion in her SVG-OM and received DES x1.  She followed up with Dr. Antoine Poche 7/22.  Her anemia was being followed by Heidi Morse.  She is not on aspirin due to GI bleed.  She was taking Plavix monotherapy.  She had been reviewed for preoperative clearance for endoscopy.  However due to recent stent placement Dr. Antoine Poche did not recommend holding Plavix until December 2022.  She underwent endoscopy 12/22/2020 at [facility] which revealed 3 bleeding areas in her duodenum which were treated with APC, a single bleeding area in her jejunum also treated with APC.  She follows with hematology oncology Dr. Allison [facility] at [facility].  She had been placed on oral iron therapy.   She was seen by Heidi Course PA-C on 01/25/2021.  During that time she was noticing worsening dyspnea on exertion.  She had fallen 2 days prior at the supermarket and bumped her left knee and right shoulder.  She was not sure why she fell.  Her blood work obtained in October showed a hemoglobin had increased to 11.6.  Due to her significant history of anemia repeat blood work including CBC, TSH and BMP were ordered.  Her lungs were clear and she did not have any significant lower extremity swelling.  She reported compliance with her Lipitor, Plavix, and  Lopressor.  Her EKG showed no acute changes.  Follow-up was planned for 3 weeks.  Her blood work remained stable with a hemoglobin of 12.2.  All of her other labs from 01/28/2021 were unremarkable.   She presented to the clinic 03/01/21  for follow-up evaluation stated she had been increasing her physical activity.  She had been doing linen changes, mopping, and other house chores.  She was somewhat limited in her physical activity due to back pain and knee pain.  She was working with  pain management.  Her breathing continued to get better.  We reviewed her lab work from her previous visit and she expressed understanding.  I  asked her to continue to lose weight, increase her physical activity as tolerated, and follow-up in 3-4 months.   She was last seen by Heidi Skiff, PA-C 09/09/2022.  She reported that she had been to [facility] on 08/22/2022 for chest burning that was worse with laying down.  It was explained that her symptoms were related to acid reflux.  Her lipase was normal.  She was prescribed Protonix.  And follow-up she presented alone.  She was doing much better.  She had not had her cardiac PET stress testing.  She reported that her chest burning had completely resolved with Protonix treatment.  She did note an occasional very brief bandlike sensation that would last for 1-2 seconds and then resolve.  She reported mild shortness of breath with ambulation however, this is also improved.  She was using home O2 as needed.  She did not orthopnea, PND, and lower extremity swelling.  Her weight was stable around 230-232 pounds.  She denied palpitations.  She did note some lightheadedness/dizziness with quick position changes.  This was very mild.  She denies syncope.  Her Heidi Morse was increased to 49-51.  Echocardiogram 10/31/2022 showed normal LVEF, G1 DD, and trivial mitral valve regurgitation.  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   She presents to the clinic today for follow-up evaluation and states she continues to notice intermittent brief episodes of chest discomfort.  She describes the pain as sharp.  It last for 1 to 2 seconds and then dissipates on its own.  We reviewed her most recent echocardiogram.  She expressed understanding.  I will continue her current medication regimen and have asked her to increase her physical activity as tolerated.  Her breathing remains stable.  She is using  oxygen as needed.  I will plan follow-up in 6 months.   Today she denies chest pain, increased shortness of breath, lower extremity edema, fatigue, palpitations, melena, hematuria, hemoptysis, diaphoresis, weakness, presyncope, syncope, orthopnea, and PND.    ROS: ***  Studies Reviewed:    EKG:       ***  Risk Assessment/Calculations:   {Does this patient have ATRIAL FIBRILLATION?[phone]} No BP recorded.  {Refresh Note OR Click here to enter BP  :1}***        Physical Exam:   VS:  LMP 04/01/2000 (Approximate)    Wt Readings from Last 3 Encounters:  11/08/22 232 lb (105.2 kg)  09/09/22 232 lb (105.2 kg)  07/18/22 232 lb (105.2 kg)     GEN: Well nourished, well developed in no acute distress NECK: No JVD; No carotid bruits CARDIAC: ***RR, *** murmurs, rubs, gallops RESPIRATORY:  Clear to auscultation without rales, wheezing or rhonchi  ABDOMEN: Soft, non-tender, non-distended EXTREMITIES:  No edema; No deformity  ASSESSMENT AND PLAN:   Status post AVR:  ***  -breathing stable no increased activity intolerance.  Echocardiogram 10/31/2022 showed normal LVEF, trivial mitral valve regurgitation, and replaced aortic valve with a mean gradient of 27 mmHg, no aortic valve regurgitation, and mildly worse pressure gradients compared to previous year. Continue to monitor   Shortness of breath:  ***  -stable.  Remains somewhat physically active.  Significant history of anemia and coronary artery disease.  Follows with Heidi Morse for GI and Heidi Morse for hematology oncology.  Presented to [facility] emergency department with burning type chest discomfort.  Was prescribed pantoprazole and symptoms resolved.  No plans for ischemic evaluation at this time. Increase physical activity as tolerated Continue current medical therapy Heart healthy low-sodium diet   Coronary artery disease:  *** -no chest pain today.  Status post CABG x2.  Underwent cardiac catheterization and received DES  x1 to SVG-OM.  Details above.  On Plavix monotherapy due to GI bleeding. Continue Plavix Heart healthy low-sodium diet Increase physical activity as tolerated   Essential hypertension: ***-BP today 122/69.  Maintain blood pressure log Continue metoprolol Heart healthy low-sodium diet Increase physical activity as tolerated   Hyperlipidemia-:  *** 07/15/2022: Cholesterol, Total 161; HDL 53; LDL Chol Calc (NIH) 90; Triglycerides 97 Continue atorvastatin Heart healthy low-sodium high-fiber diet Increase physical activity as tolerate      Follow up ***  Signed, Rollene Rotunda, MD

## 2023-03-13 ENCOUNTER — Ambulatory Visit: Payer: 59 | Admitting: Cardiology

## 2023-03-15 NOTE — Progress Notes (Unsigned)
Cardiology Office Note:   Date:  03/18/2023  ID:  Heidi Morse, DOB April 23, 1955, MRN [id number] PCP: Cathey Endow, MD  [facility] Providers Cardiologist:  Rollene Rotunda, MD {  History of Present Illness:   Heidi Morse is a 67 y.o. female  who presents for evaluation CAD s/p CABG, aortic stenosis s/p AVR, postop atrial fibrillation, ischemic cardiomyopathy, PAD s/p left common femoral artery stenting 11/2017, bilateral carotid artery disease s/p stenting of left ICA, COPD, HTN, HLD, DM 2, and obstructive sleep apnea.  She had chest pain and had an endoscopy in December 2021 that showed bleeding AVMs.  She was subsequently admitted at [facility] for anemia and GI Bleed.  She was seen at [facility] ED on 07/27/2019 due to persistent chest pain unrelieved by nitroglycerin.  Work-up was unremarkable.  She was felt to have noncardiac type chest discomfort.  She presented to [facility] near the end of December for evaluation of chest pain again.  Troponin was negative.  Hemoglobin was 7.6 and the she was transfused with 1 pack of red blood cell.  GI service was contacted who did not recommend any intervention.  She subsequently underwent left heart cath that showed patent left main stent with distal moderate restenosis, haziness noted via ostial LAD suggesting moderate restenosis of the stent, severe restenosis of the ostial left circumflex stent.  Echocardiogram showed EF 45 to 50%, mild LVH, grade 2 DD, mild to moderate AI, moderate aortic stenosis and moderate MR. She was seen by CT surgery and eventually underwent CABG x2 with LIMA to LAD and SVG to OM1 as well as AVR on 04/03/2020 by Dr. Cliffton Asters.  Postop course was complicated by atrial fibrillation with RVR.  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 She was given digoxin and beta-blocker.  She subsequently converted back to sinus rhythm therefore was not started on anticoagulation therapy. She had increased  SOB.  She had cath in 2022 and had high grade stenosis of an SVG for which she had angioplasty and had improvement in her symptoms.  She has been followed at [facility] for GI bleeding. She underwent endoscopy 12/22/2020 at [facility] which revealed 3 bleeding areas in her duodenum which were treated with APC, a single bleeding area in her jejunum also treated with APC.  She follows with hematology oncology Dr. Allison Quarry at [facility].  She had been placed on oral iron therapy.      Since I last saw her she had an echocardiogram with an EF of 65%  The AVR has normal function with mild MR. She has done well except she had a fall.  She is very limited by back and joint pain.  She walks with a walker.  She was not using it the other day and she fell.  She did not have syncope.  She has had some chest discomfort but only after eating.  She does not think she has had this kind of discomfort before.  Last maybe for about 5 minutes and is a mild pressure.  She is not having any new shortness of breath, PND or orthopnea.  She is not having any new palpitations, presyncope or syncope.  She is trying to lose weight so she did not get surgery.   ROS: As stated in the HPI and negative for all other systems.  Studies Reviewed:    EKG:   EKG Interpretation Date/Time:  Tuesday March 18 2023 13:16:57 EST Ventricular Rate:  74 PR  Interval:  146 QRS Duration:  80 QT Interval:  398 QTC Calculation: 441 R Axis:   -12  Text Interpretation: Normal sinus rhythm Moderate voltage criteria for LVH, may be normal variant ( R in aVL , Cornell product ) Poor anterior R wave progression. When compared with ECG of 04-May-2022 14:04, Sinus rhythm has replaced Ectopic atrial rhythm Confirmed by Rollene Rotunda ([id number]) on 03/18/2023 1:19:19 PM    Risk Assessment/Calculations:              Physical Exam:   VS:  BP 104/62 (BP Location: Left Wrist, Patient Position: Sitting, Cuff Size: Large)   Pulse 74   Ht 5' (1.524 m)   Wt 235 lb  (106.6 kg)   LMP 04/01/2000 (Approximate)   BMI 45.90 kg/m    Wt Readings from Last 3 Encounters:  03/18/23 235 lb (106.6 kg)  11/08/22 232 lb (105.2 kg)  09/09/22 232 lb (105.2 kg)     GEN: Well nourished, well developed in no acute distress NECK: No JVD; No carotid bruits.  Transmitted systolic murmur CARDIAC: RRR, 2 out of 6 apical brief systolic murmur radiating at the aortic outflow tract, no diastolic murmurs, rubs, gallops RESPIRATORY:  Clear to auscultation without rales, wheezing or rhonchi  ABDOMEN: Soft, non-tender, non-distended EXTREMITIES:  No edema; No deformity, decreased dorsalis pedis and posttibial's bilaterally  ASSESSMENT AND PLAN:   Symptomatic Anemia Last hemoglobin was 11.4 in September.  No change in therapy.    CAD The chest pain that she is having sounds nonanginal.  I do not think any further cardiac workup or change in meds as necessary.   Acute on chronic diastolic HF The EF was 40 - 45% in the past but now 60 - 65%.  Her blood pressure will not allow med titration.  She seems to be doing well.  No change in therapy.  HLD Total cholesterol was 105.  However, she may have been taking her Repatha every day.  I clarified this and we will send a message to my pharmacy team to actually call her at home and make sure she is taking the meds properly.  She has follow-up with her primary provider soon.  She can get a follow-up lipid profile by her report.     HTN Blood pressure is being managed in the context of treating her heart failure.    AVR She had normal valve function in August 2024.  She understands endocarditis prophylaxis.  No change in therapy.  I will follow-up with another echocardiogram in August.    Current medicines are reviewed at length with the patient today.  The patient does not have concerns regarding medicines.     Follow up with me in one year.  Signed, Rollene Rotunda, MD

## 2023-03-18 ENCOUNTER — Telehealth: Payer: Self-pay | Admitting: Pharmacist

## 2023-03-18 ENCOUNTER — Ambulatory Visit: Payer: 59 | Attending: Cardiology | Admitting: Cardiology

## 2023-03-18 ENCOUNTER — Encounter: Payer: Self-pay | Admitting: Cardiology

## 2023-03-18 VITALS — BP 104/62 | HR 74 | Ht 60.0 in | Wt 235.0 lb

## 2023-03-18 DIAGNOSIS — D508 Other iron deficiency anemias: Secondary | ICD-10-CM

## 2023-03-18 DIAGNOSIS — I2510 Atherosclerotic heart disease of native coronary artery without angina pectoris: Secondary | ICD-10-CM | POA: Diagnosis not present

## 2023-03-18 DIAGNOSIS — E785 Hyperlipidemia, unspecified: Secondary | ICD-10-CM

## 2023-03-18 DIAGNOSIS — I10 Essential (primary) hypertension: Secondary | ICD-10-CM

## 2023-03-18 DIAGNOSIS — Z952 Presence of prosthetic heart valve: Secondary | ICD-10-CM

## 2023-03-18 DIAGNOSIS — I5033 Acute on chronic diastolic (congestive) heart failure: Secondary | ICD-10-CM

## 2023-03-18 NOTE — Telephone Encounter (Signed)
I tried to call patient to clarify how she was taking Repatha. There was some concern that she was taking it incorrectly, maybe daily. No answer and no VM. Will try again later

## 2023-03-18 NOTE — Patient Instructions (Signed)
Medication Instructions:  No changes.  *If you need a refill on your cardiac medications before your next appointment, please call your pharmacy*   Testing/Procedures: Your physician has requested that you have an echocardiogram in August of 2025.Marland Kitchen Echocardiography is a painless test that uses sound waves to create images of your heart. It provides your doctor with information about the size and shape of your heart and how well your heart's chambers and valves are working. This procedure takes approximately one hour. There are no restrictions for this procedure. Please do NOT wear cologne, perfume, aftershave, or lotions (deodorant is allowed). Please arrive 15 minutes prior to your appointment time.  [address]   Please note: We ask at that you not bring children with you during ultrasound (echo/ vascular) testing. Due to room size and safety concerns, children are not allowed in the ultrasound rooms during exams. Our front office staff cannot provide observation of children in our lobby area while testing is being conducted. An adult accompanying a patient to their appointment will only be allowed in the ultrasound room at the discretion of the ultrasound technician under special circumstances. We apologize for any inconvenience.    Follow-Up: At [facility], you and your health needs are our priority.  As part of our continuing mission to provide you with exceptional heart care, we have created designated Provider Care Teams.  These Care Teams include your primary Cardiologist (physician) and Advanced Practice Providers (APPs -  Physician Assistants and Nurse Practitioners) who all work together to provide you with the care you need, when you need it.  We recommend signing up for the patient portal called "MyChart".  Sign up information is provided on this After Visit Summary.  MyChart is used to connect with patients for Virtual Visits (Telemedicine).  Patients are able to view  lab/test results, encounter notes, upcoming appointments, etc.  Non-urgent messages can be sent to your provider as well.   To learn more about what you can do with MyChart, go to [email or web address].    Your next appointment:   12 month(s)  Provider:   Rollene Rotunda, MD

## 2023-03-19 NOTE — Telephone Encounter (Signed)
Spoke with patient.  She confirms that she took the Repatha daily for 6 days.  States there was no information in or on the box that told her it was every 14 days, nor did the pharmacist at the office when she was prescribed the medication.  (Per chart she has taken in the past - 2 doses in August - unknown if she took them 2 days in a row or 14 days apart.)  Have reached out to [employer] for guidance as to any concerns and when safe to re-start.

## 2023-03-21 NOTE — Telephone Encounter (Signed)
Spoke with [employer] on Wednesday.  They have no data for patients overdosing, but don't feel there is any urgent concerns.    I spoke with patient today, she is not complaining of any side effects.  Advised to stay off medication for now and will call her around March 1 to restart.  At that time will stress the q14d dosing.

## 2023-04-17 ENCOUNTER — Other Ambulatory Visit: Payer: Self-pay | Admitting: Student

## 2023-04-17 ENCOUNTER — Other Ambulatory Visit: Payer: Self-pay | Admitting: Cardiology

## 2023-07-18 ENCOUNTER — Telehealth: Payer: Self-pay | Admitting: Pharmacy Technician

## 2023-07-18 NOTE — Telephone Encounter (Signed)
 Pharmacy Patient Advocate Encounter  Received notification from [facility] that Prior Authorization for repatha  has been APPROVED from 07/18/23 to 03/31/24. Spoke to pharmacy to process.Copay is $0.00.    PA #/Case ID/Reference #: [id number]

## 2023-07-18 NOTE — Telephone Encounter (Signed)
 Pharmacy Patient Advocate Encounter   Received notification from CoverMyMeds that prior authorization for repatha  is required/requested.   Insurance verification completed.   The patient is insured through [facility] .   Per test claim: PA required; PA submitted to above mentioned insurance via CoverMyMeds Key/confirmation #/EOC [id number] Status is pending

## 2023-07-24 ENCOUNTER — Emergency Department (HOSPITAL_BASED_OUTPATIENT_CLINIC_OR_DEPARTMENT_OTHER)

## 2023-07-24 ENCOUNTER — Emergency Department (HOSPITAL_BASED_OUTPATIENT_CLINIC_OR_DEPARTMENT_OTHER)
Admission: EM | Admit: 2023-07-24 | Discharge: 2023-07-24 | Disposition: A | Discharge status: Home / Self Care | Attending: Emergency Medicine | Admitting: Emergency Medicine

## 2023-07-24 ENCOUNTER — Other Ambulatory Visit: Payer: Self-pay

## 2023-07-24 ENCOUNTER — Encounter (HOSPITAL_BASED_OUTPATIENT_CLINIC_OR_DEPARTMENT_OTHER): Payer: Self-pay | Admitting: Emergency Medicine

## 2023-07-24 DIAGNOSIS — Z952 Presence of prosthetic heart valve: Secondary | ICD-10-CM | POA: Diagnosis not present

## 2023-07-24 DIAGNOSIS — Z951 Presence of aortocoronary bypass graft: Secondary | ICD-10-CM | POA: Insufficient documentation

## 2023-07-24 DIAGNOSIS — Z794 Long term (current) use of insulin: Secondary | ICD-10-CM | POA: Insufficient documentation

## 2023-07-24 DIAGNOSIS — I6523 Occlusion and stenosis of bilateral carotid arteries: Secondary | ICD-10-CM | POA: Diagnosis not present

## 2023-07-24 DIAGNOSIS — I2510 Atherosclerotic heart disease of native coronary artery without angina pectoris: Secondary | ICD-10-CM | POA: Insufficient documentation

## 2023-07-24 DIAGNOSIS — Z79899 Other long term (current) drug therapy: Secondary | ICD-10-CM | POA: Insufficient documentation

## 2023-07-24 DIAGNOSIS — I4891 Unspecified atrial fibrillation: Secondary | ICD-10-CM | POA: Diagnosis not present

## 2023-07-24 DIAGNOSIS — R1110 Vomiting, unspecified: Secondary | ICD-10-CM | POA: Insufficient documentation

## 2023-07-24 DIAGNOSIS — R0602 Shortness of breath: Secondary | ICD-10-CM | POA: Insufficient documentation

## 2023-07-24 DIAGNOSIS — Z91010 Allergy to peanuts: Secondary | ICD-10-CM | POA: Insufficient documentation

## 2023-07-24 DIAGNOSIS — I25709 Atherosclerosis of coronary artery bypass graft(s), unspecified, with unspecified angina pectoris: Secondary | ICD-10-CM

## 2023-07-24 DIAGNOSIS — R079 Chest pain, unspecified: Secondary | ICD-10-CM | POA: Insufficient documentation

## 2023-07-24 DIAGNOSIS — Z953 Presence of xenogenic heart valve: Secondary | ICD-10-CM

## 2023-07-24 LAB — COMPREHENSIVE METABOLIC PANEL WITH GFR
ALT: 11 U/L (ref 0–44)
AST: 17 U/L (ref 15–41)
Albumin: 4 g/dL (ref 3.5–5.0)
Alkaline Phosphatase: 74 U/L (ref 38–126)
Anion gap: 15 (ref 5–15)
BUN: 14 mg/dL (ref 8–23)
CO2: 20 mmol/L — ABNORMAL LOW (ref 22–32)
Calcium: 9.3 mg/dL (ref 8.9–10.3)
Chloride: 108 mmol/L (ref 98–111)
Creatinine, Ser: 0.89 mg/dL (ref 0.44–1.00)
GFR, Estimated: >60 mL/min (ref >60)
Glucose, Bld: 91 mg/dL (ref 70–99)
Potassium: 3.8 mmol/L (ref 3.5–5.1)
Sodium: 142 mmol/L (ref 135–145)
Total Bilirubin: 0.3 mg/dL (ref 0.0–1.2)
Total Protein: 7.4 g/dL (ref 6.5–8.1)

## 2023-07-24 LAB — CBC
HCT: 37.9 % (ref 36.0–46.0)
Hemoglobin: 11.9 g/dL — ABNORMAL LOW (ref 12.0–15.0)
MCH: 26 pg (ref 26.0–34.0)
MCHC: 31.4 g/dL (ref 30.0–36.0)
MCV: 82.9 fL (ref 80.0–100.0)
Platelets: 223 10*3/uL (ref 150–400)
RBC: 4.57 MIL/uL (ref 3.87–5.11)
RDW: 16.2 % — ABNORMAL HIGH (ref 11.5–15.5)
WBC: 11.9 10*3/uL — ABNORMAL HIGH (ref 4.0–10.5)
nRBC: 0 % (ref 0.0–0.2)

## 2023-07-24 LAB — PRO BRAIN NATRIURETIC PEPTIDE: Pro Brain Natriuretic Peptide: 101 pg/mL (ref <300.0)

## 2023-07-24 LAB — TROPONIN T, HIGH SENSITIVITY
Troponin T High Sensitivity: <15 ng/L (ref <19)
Troponin T High Sensitivity: <15 ng/L (ref <19)

## 2023-07-24 NOTE — ED Triage Notes (Signed)
 Pt has been having emesis in the am followed by chest pain x 2 weeks.  Chest pain returns with exertion.  Some sob this week.  No abdominal or new back pain.  Pt states she has chronic back pain.

## 2023-07-24 NOTE — ED Provider Notes (Addendum)
 [facility] Provider Note   CSN: [id number] Arrival date & time: 07/24/23  7829     History  Chief Complaint  Patient presents with   Chest Pain   Emesis    Heidi Morse is a 68 y.o. female. The patient, with a history of CAD s/p CABG, aortic stenosis s/p AVR, postop atrial fibrillation, ischemic cardiomyopathy, PAD s/p left common femoral artery stenting 11/2017, bilateral carotid artery disease s/p stenting of left ICA, COPD, HTN, HLD, DM 2, and obstructive sleep apnea, presents with daily vomiting for the past three weeks. The vomiting is triggered by brushing their teeth in the morning, but they believe there is another underlying cause. Concurrent with the onset of vomiting, they began experiencing chest pain, described as a pressure that occurs both at rest and with activity. The chest pain is not currently present, but occurs randomly throughout the day.  This week, they also began experiencing shortness of breath with minimal exertion, such as walking from the bedroom to the kitchen. They report needing to sit down to catch their breath and are unable to lay flat due to discomfort, requiring the use of pillows for sleep. They also report increased fatigue, despite taking B12 supplements, and a raw, itching sensation in their throat.  They deny fever, chills, and leg swelling, but report feeling feverish. They had one episode of bright red blood in their stool earlier this week, but have not noticed any since. They have not taken their usual medications today, which include Plavix  and insulin , among others. They report no recent head injury or loss of consciousness.     Home Medications Prior to Admission medications   Medication Sig Start Date End Date Taking? Authorizing Provider  ACCU-CHEK AVIVA PLUS test strip 4 (four) times daily. as directed 08/23/22   [provider]  albuterol  (VENTOLIN  HFA) 108 (90 Base) MCG/ACT  inhaler Inhale 2 puffs into the lungs every 6 (six) hours as needed for wheezing. 03/09/20   [provider]  Blood Glucose Monitoring Suppl (TGT BLOOD GLUCOSE MONITORING) w/Device KIT Use as directed.  DX:  E11.42 03/08/20   [provider]  cholecalciferol  (VITAMIN D3) 25 MCG (1000 UNIT) tablet Take 1,000 Units by mouth 2 (two) times daily.    [provider]  clopidogrel  (PLAVIX ) 75 MG tablet TAKE 1 TABLET(75 MG) BY MOUTH DAILY WITH BREAKFAST 10/30/22   Eilleen Grates, MD  empagliflozin  (JARDIANCE ) 10 MG TABS tablet Take 1 tablet (10 mg total) by mouth daily before breakfast. 12/17/22   Eilleen Grates, MD  Evolocumab  (REPATHA  SURECLICK) 140 MG/ML SOAJ Inject 140 mg into the skin every 14 (fourteen) days. 11/29/22   Eilleen Grates, MD  ezetimibe  (ZETIA ) 10 MG tablet Take 1 tablet (10 mg total) by mouth daily. 09/09/22   Goodrich, Callie E, PA-C  ferrous sulfate  325 (65 FE) MG tablet Take 325 mg by mouth 2 (two) times daily. 03/24/20   [provider]  Fluticasone -Umeclidin-Vilant (TRELEGY ELLIPTA IN) Inhale 1 puff into the lungs daily.    [provider]  gabapentin  (NEURONTIN ) 300 MG capsule Take 300 mg by mouth See admin instructions. Take one capsule (300 mg) by mouth twice daily - may also take 2 more times during the day as needed for pain 02/01/20   [provider]  GEMTESA 75 MG TABS Take by mouth. 10/19/22   [provider]  HYDROcodone -acetaminophen  (NORCO) 10-325 MG tablet Take 1 tablet by mouth every 4 (four) hours  as needed (pain).    [provider]  insulin  glargine (LANTUS ) 100 UNIT/ML Solostar Pen Inject 24 Units into the skin in the morning and at bedtime.    [provider]  insulin  lispro (HUMALOG) 100 UNIT/ML injection Inject 50 Units into the skin See admin instructions.  sliding scale    [provider]  Metoprolol  Tartrate 75 MG TABS TAKE 1 TABLET(75 MG) BY MOUTH DAILY 04/17/23   Eilleen Grates, MD  montelukast  (SINGULAIR ) 10 MG tablet Take 10 mg by mouth every morning. 01/05/20   [provider]  nitroGLYCERIN  (NITROSTAT ) 0.4 MG SL tablet PLACE 1 TABLET UNDER THE TONGUE EVERY 5 MINUTES AS NEEDED FOR CHEST PAIN. 09/09/22   Goodrich, Callie E, PA-C  pantoprazole  (PROTONIX ) 40 MG tablet Take 1 tablet (40 mg total) by mouth daily. 06/01/21   Eilleen Grates, MD  rosuvastatin  (CRESTOR ) 40 MG tablet TAKE 1 TABLET(40 MG) BY MOUTH DAILY 12/31/22   Eilleen Grates, MD  sacubitril-valsartan (ENTRESTO ) 49-51 MG Take 1 tablet by mouth 2 (two) times daily. 12/17/22   Eilleen Grates, MD  solifenacin (VESICARE) 5 MG tablet Take 5 mg by mouth daily.    [provider]  spironolactone  (ALDACTONE ) 25 MG tablet Take 0.5 tablets (12.5 mg total) by mouth daily. 04/17/23   Eilleen Grates, MD  valACYclovir (VALTREX) 500 MG tablet Take 500 mg by mouth daily as needed (for flare ups).    [provider]      Allergies    Peanut oil, Povidone-iodine, Shellfish allergy, Simvastatin, Statins, and Iodine    Review of Systems   Review of Systems  Constitutional:  Negative for chills and fever.  HENT:  Positive for sore throat.   Eyes:  Negative for visual disturbance.  Respiratory:  Positive for shortness of breath. Negative for cough.   Cardiovascular:  Positive for chest pain. Negative for palpitations.  Gastrointestinal:  Positive for vomiting. Negative for abdominal pain.  Genitourinary:  Negative for dysuria and hematuria.  Musculoskeletal:  Negative for arthralgias and back pain.  Skin:  Negative for color change and rash.  Neurological:  Negative for seizures and syncope.  All other systems reviewed and are negative.   Physical Exam Updated Vital Signs BP 112/74 (BP Location: Right Arm)   Pulse (!) 103   Temp 98.4 F (36.9 C) (Oral)   Resp 18   Ht 5' (1.524 m)   Wt 106.1 kg   LMP 04/01/2000 (Approximate)   SpO2 98%   BMI 45.70 kg/m  Physical Exam Vitals  reviewed.  Constitutional:      Appearance: Normal appearance. She is well-developed.  HENT:     Head: Normocephalic.     Nose: Nose normal.     Mouth/Throat:     Mouth: Mucous membranes are moist.  Eyes:     Conjunctiva/sclera: Conjunctivae normal.  Cardiovascular:     Rate and Rhythm: Normal rate and regular rhythm.     Pulses:          Dorsalis pedis pulses are 2+ on the right side and 2+ on the left side.     Heart sounds: Normal heart sounds.  Pulmonary:     Effort: Pulmonary effort is normal.     Breath sounds: Normal breath sounds.  Chest:     Chest wall: No mass or crepitus.  Abdominal:     General: Bowel sounds are normal. There is no abdominal bruit.     Palpations: Abdomen is soft.  Musculoskeletal:  General: Normal range of motion.     Cervical back: Normal range of motion.  Skin:    General: Skin is warm.     Capillary Refill: Capillary refill takes less than 2 seconds.  Neurological:     General: No focal deficit present.     Mental Status: She is alert.  Psychiatric:        Mood and Affect: Mood normal.        Behavior: Behavior normal.     ED Results / Procedures / Treatments   Labs (all labs ordered are listed, but only abnormal results are displayed) Labs Reviewed  CBC - Abnormal; Notable for the following components:      Result Value   WBC 11.9 (*)    Hemoglobin 11.9 (*)    RDW 16.2 (*)    All other components within normal limits  COMPREHENSIVE METABOLIC PANEL WITH GFR - Abnormal; Notable for the following components:   CO2 20 (*)    All other components within normal limits  PRO BRAIN NATRIURETIC PEPTIDE  TROPONIN T, HIGH SENSITIVITY  TROPONIN T, HIGH SENSITIVITY    EKG EKG Interpretation Date/Time:  Thursday July 24 2023 08:18:10 EDT Ventricular Rate:  97 PR Interval:  124 QRS Duration:  74 QT Interval:  340 QTC Calculation: 431 R Axis:   -14  Text Interpretation: Normal sinus rhythm Minimal voltage criteria for LVH, may  be normal variant ( R in aVL ) Inferior infarct , age undetermined T wave abnormality, consider lateral ischemia Abnormal ECG When compared with ECG of 18-Mar-2023 13:16, No significant change since last tracing Confirmed by Scarlette Currier ([id number]) on 07/24/2023 8:41:26 AM  Radiology DG Chest 2 View Result Date: 07/24/2023 CLINICAL DATA:  Shortness of breath. EXAM: CHEST - 2 VIEW COMPARISON:  Chest radiograph dated 05/04/2022. FINDINGS: No focal consolidation, pleural effusion, pneumothorax. Stable cardiomegaly. Median sternotomy wires and CABG vascular clips. Mechanical cardiac valve. No acute osseous pathology. IMPRESSION: 1. No active cardiopulmonary disease. 2. Cardiomegaly. Electronically Signed   By: Angus Bark M.D.   On: 07/24/2023 10:04    Procedures Procedures    Medications Ordered in ED Medications - No data to display  ED Course/ Medical Decision Making/ A&P                                 Medical Decision Making Amount and/or Complexity of Data Reviewed Labs: ordered. Radiology: ordered.   Medical Decision Making:   Gerianne Simonet is a 68 y.o. female who presented to the ED today with chest pain detailed above.    Patient's presentation is complicated by their history of longstanding cardiac disease.  Complete initial physical exam performed, notably the patient was nontoxic and well appearing while hemodynamically stable.    Reviewed and confirmed nursing documentation for past medical history, family history, social history.    Initial Assessment:   With the patient's presentation of chest pain, emesis, and shortness of breath, will assess for cardiac chest pain given extensive cardiac history. Other diagnoses were considered including (but not limited to) PTX, PNA, PE, esophageal perforation, esophageal tear. These are considered less likely due to history of present illness and physical exam findings.   This is most consistent with an acute complicated  illness  Initial Plan:  Trop, BNP  Screening labs including CBC and Metabolic panel to evaluate for infectious or metabolic etiology of disease.  CXR to evaluate for structural/infectious intrathoracic  pathology.  EKG to evaluate for cardiac pathology Objective evaluation as below reviewed   Initial Study Results:   Laboratory  All laboratory results reviewed without evidence of clinically relevant pathology.   Slight leukocytosis to 11.9  EKG EKG was reviewed independently. Rate, rhythm, axis, intervals all examined and without medically relevant abnormality. ST segments without concerns for elevations.    Radiology:  All images reviewed independently. Agree with radiology report at this time.   DG Chest 2 View Result Date: 07/24/2023 CLINICAL DATA:  Shortness of breath. EXAM: CHEST - 2 VIEW COMPARISON:  Chest radiograph dated 05/04/2022. FINDINGS: No focal consolidation, pleural effusion, pneumothorax. Stable cardiomegaly. Median sternotomy wires and CABG vascular clips. Mechanical cardiac valve. No acute osseous pathology. IMPRESSION: 1. No active cardiopulmonary disease. 2. Cardiomegaly. Electronically Signed   By: Angus Bark M.D.   On: 07/24/2023 10:04      Consults: Case discussed with Dr. Alda Amas with cardiology who recommends stress testing outpatient.   Final Assessment and Plan:    Intermittent chest pain  emesis: I evaluated patient via BNP that was negative, troponins trended flat with both being less than 15, EKG without acute changes.  Given the patient's extensive cardiac history, called [facility] with Dr. Alda Amas who assessed this chart after discussion of the patient's symptoms.  He reports that he would recommend ensuring troponins are negative.  Additionally, would recommend close follow-up with [facility] outpatient for possible stress test.  Would not recommend admission at this time.  With shared decision making, the patient opted to continue outpatient with  cardiology advice.  I did offer the opportunity for an admission with hospitalist service for monitoring overnight.  She politely declined and opted for outpatient follow-up.  Provided the patient with heart care information for her to call outpatient to be seen in the next day or so.  Currently the patient is not experiencing any chest pain, is hemodynamically stable, and non toxic in appearance. Patient verbalized understanding.  Strict return precautions provided for the patient.  Clinical Impression:  1. Chest pain, unspecified type      Data Unavailable          Final Clinical Impression(s) / ED Diagnoses Final diagnoses:  Chest pain, unspecified type    Rx / DC Orders ED Discharge Orders     None         Ernestina Headland, MD 07/24/23 1204    Ernestina Headland, MD 07/24/23 9147    Scarlette Currier, MD 07/24/23 2222

## 2023-07-24 NOTE — Discharge Instructions (Addendum)
 We checked you for any abnormalities of the heart. All of your labs were very good which is reassuring. We talked to cardiology and they recommended outpatient follow up.   I will provide you with the number to the cardiologist to call for an appt in next day or so  Please return if symptoms are worsening or changing in any way.

## 2023-07-27 ENCOUNTER — Emergency Department (HOSPITAL_BASED_OUTPATIENT_CLINIC_OR_DEPARTMENT_OTHER)
Admission: EM | Admit: 2023-07-27 | Discharge: 2023-07-27 | Disposition: A | Discharge status: Home / Self Care | Attending: Emergency Medicine | Admitting: Emergency Medicine

## 2023-07-27 ENCOUNTER — Other Ambulatory Visit: Payer: Self-pay

## 2023-07-27 ENCOUNTER — Encounter (HOSPITAL_BASED_OUTPATIENT_CLINIC_OR_DEPARTMENT_OTHER): Payer: Self-pay | Admitting: Emergency Medicine

## 2023-07-27 ENCOUNTER — Emergency Department (HOSPITAL_BASED_OUTPATIENT_CLINIC_OR_DEPARTMENT_OTHER)

## 2023-07-27 DIAGNOSIS — Z7901 Long term (current) use of anticoagulants: Secondary | ICD-10-CM | POA: Diagnosis not present

## 2023-07-27 DIAGNOSIS — R079 Chest pain, unspecified: Secondary | ICD-10-CM | POA: Insufficient documentation

## 2023-07-27 DIAGNOSIS — Z794 Long term (current) use of insulin: Secondary | ICD-10-CM | POA: Diagnosis not present

## 2023-07-27 DIAGNOSIS — R052 Subacute cough: Secondary | ICD-10-CM | POA: Insufficient documentation

## 2023-07-27 DIAGNOSIS — R5381 Other malaise: Secondary | ICD-10-CM | POA: Insufficient documentation

## 2023-07-27 LAB — BASIC METABOLIC PANEL WITH GFR
Anion gap: 15 (ref 5–15)
BUN: 13 mg/dL (ref 8–23)
CO2: 18 mmol/L — ABNORMAL LOW (ref 22–32)
Calcium: 9.4 mg/dL (ref 8.9–10.3)
Chloride: 110 mmol/L (ref 98–111)
Creatinine, Ser: 0.91 mg/dL (ref 0.44–1.00)
GFR, Estimated: >60 mL/min (ref >60)
Glucose, Bld: 113 mg/dL — ABNORMAL HIGH (ref 70–99)
Potassium: 4.3 mmol/L (ref 3.5–5.1)
Sodium: 143 mmol/L (ref 135–145)

## 2023-07-27 LAB — CBC WITH DIFFERENTIAL/PLATELET
Abs Immature Granulocytes: 0.02 10*3/uL (ref 0.00–0.07)
Basophils Absolute: 0 10*3/uL (ref 0.0–0.1)
Basophils Relative: 0 %
Eosinophils Absolute: 0.2 10*3/uL (ref 0.0–0.5)
Eosinophils Relative: 2 %
HCT: 38.6 % (ref 36.0–46.0)
Hemoglobin: 12.2 g/dL (ref 12.0–15.0)
Immature Granulocytes: 0 %
Lymphocytes Relative: 24 %
Lymphs Abs: 2 10*3/uL (ref 0.7–4.0)
MCH: 26.1 pg (ref 26.0–34.0)
MCHC: 31.6 g/dL (ref 30.0–36.0)
MCV: 82.5 fL (ref 80.0–100.0)
Monocytes Absolute: 0.6 10*3/uL (ref 0.1–1.0)
Monocytes Relative: 8 %
Neutro Abs: 5.4 10*3/uL (ref 1.7–7.7)
Neutrophils Relative %: 66 %
Platelets: 242 10*3/uL (ref 150–400)
RBC: 4.68 MIL/uL (ref 3.87–5.11)
RDW: 16.3 % — ABNORMAL HIGH (ref 11.5–15.5)
WBC: 8.3 10*3/uL (ref 4.0–10.5)
nRBC: 0 % (ref 0.0–0.2)

## 2023-07-27 LAB — RESP PANEL BY RT-PCR (RSV, FLU A&B, COVID)  RVPGX2
Influenza A by PCR: NEGATIVE
Influenza B by PCR: NEGATIVE
Resp Syncytial Virus by PCR: NEGATIVE
SARS Coronavirus 2 by RT PCR: NEGATIVE

## 2023-07-27 LAB — TROPONIN T, HIGH SENSITIVITY: Troponin T High Sensitivity: <15 ng/L (ref <19)

## 2023-07-27 NOTE — ED Provider Notes (Signed)
 [place] [facility] Provider Note   CSN: [id number] Arrival date & time: 07/27/23  1546     History  Chief Complaint  Patient presents with   Chest Pain    Heidi Morse is a 68 y.o. female.  HPI Patient reports she had chest discomfort since her last visit.  She reports she is not having any vomiting anymore.  She reports she is coughing up some gold colored sputum.  She has not had any fever.  She does not feel short of breath.  No lower extremity swelling or calf pain.  She reports she still does not feel very good but has been eating.  She ate some spaghetti and garlic toast yesterday and ate tomato mayo sandwich today.  She did not have any vomiting after these episodes.    Home Medications Prior to Admission medications   Medication Sig Start Date End Date Taking? Authorizing Provider  ACCU-CHEK AVIVA PLUS test strip 4 (four) times daily. as directed 08/23/22   [provider]  albuterol  (VENTOLIN  HFA) 108 (90 Base) MCG/ACT inhaler Inhale 2 puffs into the lungs every 6 (six) hours as needed for wheezing. 03/09/20   [provider]  Blood Glucose Monitoring Suppl (TGT BLOOD GLUCOSE MONITORING) w/Device KIT Use as directed.  DX:  E11.42 03/08/20   [provider]  cholecalciferol  (VITAMIN D3) 25 MCG (1000 UNIT) tablet Take 1,000 Units by mouth 2 (two) times daily.    [provider]  clopidogrel  (PLAVIX ) 75 MG tablet TAKE 1 TABLET(75 MG) BY MOUTH DAILY WITH BREAKFAST 10/30/22   Eilleen Grates, MD  empagliflozin  (JARDIANCE ) 10 MG TABS tablet Take 1 tablet (10 mg total) by mouth daily before breakfast. 12/17/22   Eilleen Grates, MD  Evolocumab  (REPATHA  SURECLICK) 140 MG/ML SOAJ Inject 140 mg into the skin every 14 (fourteen) days. 11/29/22   Eilleen Grates, MD  ezetimibe  (ZETIA ) 10 MG tablet Take 1 tablet (10 mg total) by mouth daily. 09/09/22   Goodrich, Callie E, PA-C  ferrous sulfate  325 (65 FE) MG tablet Take  325 mg by mouth 2 (two) times daily. 03/24/20   [provider]  Fluticasone -Umeclidin-Vilant (TRELEGY ELLIPTA IN) Inhale 1 puff into the lungs daily.    [provider]  gabapentin  (NEURONTIN ) 300 MG capsule Take 300 mg by mouth See admin instructions. Take one capsule (300 mg) by mouth twice daily - may also take 2 more times during the day as needed for pain 02/01/20   [provider]  GEMTESA 75 MG TABS Take by mouth. 10/19/22   [provider]  HYDROcodone -acetaminophen  (NORCO) 10-325 MG tablet Take 1 tablet by mouth every 4 (four) hours as needed (pain).    [provider]  insulin  glargine (LANTUS ) 100 UNIT/ML Solostar Pen Inject 24 Units into the skin in the morning and at bedtime.    [provider]  insulin  lispro (HUMALOG) 100 UNIT/ML injection Inject 50 Units into the skin See admin instructions.  sliding scale    [provider]  Metoprolol  Tartrate 75 MG TABS TAKE 1 TABLET(75 MG) BY MOUTH DAILY 04/17/23   Eilleen Grates, MD  montelukast  (SINGULAIR ) 10 MG tablet Take 10 mg by mouth every morning. 01/05/20   [provider]  nitroGLYCERIN  (NITROSTAT ) 0.4 MG SL tablet PLACE 1 TABLET UNDER THE TONGUE EVERY 5 MINUTES AS NEEDED FOR CHEST PAIN. 09/09/22   Goodrich, Callie E, PA-C  pantoprazole  (PROTONIX ) 40 MG tablet Take 1 tablet (40 mg total) by  mouth daily. 06/01/21   Eilleen Grates, MD  rosuvastatin  (CRESTOR ) 40 MG tablet TAKE 1 TABLET(40 MG) BY MOUTH DAILY 12/31/22   Eilleen Grates, MD  sacubitril-valsartan (ENTRESTO ) 49-51 MG Take 1 tablet by mouth 2 (two) times daily. 12/17/22   Eilleen Grates, MD  solifenacin (VESICARE) 5 MG tablet Take 5 mg by mouth daily.    [provider]  spironolactone  (ALDACTONE ) 25 MG tablet Take 0.5 tablets (12.5 mg total) by mouth daily. 04/17/23   Eilleen Grates, MD  valACYclovir (VALTREX) 500 MG tablet Take 500 mg by mouth daily as needed (for flare ups).    [provider]       Allergies    Peanut oil, Povidone-iodine, Shellfish allergy, Simvastatin, Statins, and Iodine    Review of Systems   Review of Systems  Physical Exam Updated Vital Signs BP (!) 113/58   Pulse 66   Temp 98.6 F (37 C) (Oral)   Resp 11   Ht 5' (1.524 m)   Wt 106.1 kg   LMP 04/01/2000 (Approximate)   SpO2 100%   BMI 45.68 kg/m  Physical Exam Constitutional:      Comments: Alert nontoxic.  Mental status clear.  No respiratory distress at rest.  No acute distress.  HENT:     Mouth/Throat:     Pharynx: Oropharynx is clear.  Eyes:     Extraocular Movements: Extraocular movements intact.  Cardiovascular:     Rate and Rhythm: Normal rate and regular rhythm.  Pulmonary:     Effort: Pulmonary effort is normal.     Breath sounds: Normal breath sounds.  Abdominal:     General: There is no distension.     Palpations: Abdomen is soft.     Tenderness: There is no abdominal tenderness. There is no guarding.  Musculoskeletal:        General: No swelling or tenderness. Normal range of motion.     Right lower leg: No edema.     Left lower leg: No edema.  Skin:    General: Skin is warm and dry.  Neurological:     General: No focal deficit present.     Mental Status: She is oriented to person, place, and time.     Motor: No weakness.     Coordination: Coordination normal.     ED Results / Procedures / Treatments   Labs (all labs ordered are listed, but only abnormal results are displayed) Labs Reviewed  BASIC METABOLIC PANEL WITH GFR - Abnormal; Notable for the following components:      Result Value   CO2 18 (*)    Glucose, Bld 113 (*)    All other components within normal limits  CBC WITH DIFFERENTIAL/PLATELET - Abnormal; Notable for the following components:   RDW 16.3 (*)    All other components within normal limits  RESP PANEL BY RT-PCR (RSV, FLU A&B, COVID)  [id number]  TROPONIN T, HIGH SENSITIVITY  TROPONIN T, HIGH SENSITIVITY    EKG EKG  Interpretation Date/Time:  Sunday July 27 2023 15:55:44 EDT Ventricular Rate:  73 PR Interval:  152 QRS Duration:  88 QT Interval:  387 QTC Calculation: 427 R Axis:   -10  Text Interpretation: Sinus rhythm Left ventricular hypertrophy Anterior Q waves, possibly due to LVH no sig change compared to previous Confirmed by Wynetta Heckle [phone]) on 07/27/2023 5:11:04 PM  Radiology DG Chest 2 View Result Date: 07/27/2023 CLINICAL DATA:  Emesis and chest pain for 2 weeks, shortness of breath EXAM: CHEST -  2 VIEW COMPARISON:  07/24/2023 FINDINGS: Frontal and lateral views of the chest demonstrate postsurgical changes from CABG and aortic valve replacement. The cardiac silhouette remains enlarged. No acute airspace disease, effusion, or pneumothorax. No acute bony abnormalities. IMPRESSION: 1. Stable chest, no acute process. Electronically Signed   By: Bobbye Burrow M.D.   On: 07/27/2023 18:37    Procedures Procedures    Medications Ordered in ED Medications - No data to display  ED Course/ Medical Decision Making/ A&P                                 Medical Decision Making Amount and/or Complexity of Data Reviewed Labs: ordered. Radiology: ordered.   Patient presents as outlined.  Patient does not have any respiratory distress.  Vital signs are normal at this time.  Oxygen saturation is 100% on room air.  Heart rate is 78 with sinus rhythm on the monitor narrow complex.  Blood pressures are normotensive.  Patient did have diagnostic evaluation in the emergency department 3 days ago.  At that time diagnostic evaluation negative for acute findings.  Plan had been made for follow-up with cardiology.  Patient does have significant cardiac risk factors and comorbidities.  Will repeat troponins and basic lab work with chest x-ray today.  Troponin less than 15.  Respiratory panel negative.  CBC normal with normal differential.  Chest x-ray personally reviewed by myself and reviewed by  radiology, no acute findings.  At this time patient is clinically well in appearance.  She does have cardiac risk factors but currently no signs of active ACS or CHF.  Vital signs are normal, diagnostic workup is stable.  I do feel she is stable for continued outpatient management and cardiac follow-up Wednesday as planned.  Patient reports having some yellow sputum.  At this time she is nontoxic and is not showing signs of pneumonia.  There is increased pollen in allergen at this time.  I have suggested using her albuterol  a couple times a day to see if this is helpful.  She should also follow-up with PCP to see if there is any change in symptoms or further diagnostic evaluation or specialty referral is needed.        Final Clinical Impression(s) / ED Diagnoses Final diagnoses:  Chest pain, unspecified type  Malaise  Subacute cough    Rx / DC Orders ED Discharge Orders     None         Wynetta Heckle, MD 07/27/23 2018

## 2023-07-27 NOTE — Discharge Instructions (Signed)
 1.  Follow-up with the cardiologist this Wednesday as planned. 2.  Your cough may be due to COPD and some increased allergens with a lot of current pollen.  Try using your albuterol  inhaler a couple times a day for the next few days to see if this is helpful. 3.  Schedule a follow-up with your primary care doctor as well as your cardiologist.  They should monitor to make sure your cough is resolving.  Return to the emergency department if you get a fever, shortness of breath, bloody or worsening sputum or other concerning changes.

## 2023-07-29 NOTE — Progress Notes (Unsigned)
 Cardiology Office Note:   Date:  07/30/2023  ID:  Heidi Morse, DOB [birth year], MRN [id number] PCP: Azalee Lenz, MD  [facility] Providers Cardiologist:  Eilleen Grates, MD {  History of Present Illness:   Heidi Morse is a 68 y.o. female who presents for evaluation CAD s/p CABG, aortic stenosis s/p AVR, postop atrial fibrillation, ischemic cardiomyopathy, PAD s/p left common femoral artery stenting 11/2017, bilateral carotid artery disease s/p stenting of left ICA, COPD, HTN, HLD, DM 2, and obstructive sleep apnea.  She had chest pain and had an endoscopy in December 2021 that showed bleeding AVMs.  She was subsequently admitted at [facility] for anemia and GI Bleed.  She was seen at [facility] ED on 07/27/2019 due to persistent chest pain unrelieved by nitroglycerin .  Work-up was unremarkable.  She was felt to have noncardiac type chest discomfort.  She presented to [facility] near the end of December for evaluation of chest pain again.  Troponin was negative.  Hemoglobin was 7.6 and the she was transfused with 1 pack of red blood cell.  GI service was contacted who did not recommend any intervention.  She subsequently underwent left heart cath that showed patent left main stent with distal moderate restenosis, haziness noted via ostial LAD suggesting moderate restenosis of the stent, severe restenosis of the ostial left circumflex stent.  Echocardiogram showed EF 45 to 50%, mild LVH, grade 2 DD, mild to moderate AI, moderate aortic stenosis and moderate MR. She was seen by CT surgery and eventually underwent CABG x2 with LIMA to LAD and SVG to OM1 as well as AVR on 04/03/2020 by Dr. Deloise Ferries.  Postop course was complicated by atrial fibrillation with RVR.  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 She was given digoxin  and beta-blocker.  She subsequently converted back to sinus rhythm therefore was not started on anticoagulation therapy. She had increased SOB.   She had cath in 2022 and had high grade stenosis of an SVG for which she had angioplasty and had improvement in her symptoms.  She has been followed at [facility] for GI bleeding. She underwent endoscopy 12/22/2020 at [facility] which revealed 3 bleeding areas in her duodenum which were treated with APC, a single bleeding area in her jejunum also treated with APC.  She follows with hematology oncology Dr. Nicolas Barren at [facility].  She had been placed on oral iron therapy.      Since I last saw her she has been in the ED with chest pain twice.  I reviewed these records for this visit.  She presented because she was having vomiting every morning but she says she is better now.  She is not having any of the usual symptoms or symptom that she had prior to her surgeries.  She is fatigued.  She gets around with a walker.  However, she says she does much better than she used to.  the patient denies any new symptoms such as chest discomfort, neck or arm discomfort. There has been no new shortness of breath, PND or orthopnea. There have been no reported palpitations, presyncope or syncope.   ROS: As stated in the HPI and negative for all other systems.  Studies Reviewed:    EKG:   EKG Interpretation Date/Time:  Wednesday July 30 2023 10:31:57 EDT Ventricular Rate:  81 PR Interval:  144 QRS Duration:  82 QT Interval:  396 QTC Calculation: 460 R Axis:   -6  Text Interpretation: Normal  sinus rhythm Moderate voltage criteria for LVH, may be normal variant ( R in aVL , Cornell product ) Poor anterior R wave progression When compared with ECG of 27-Jul-2023 15:55, No significant change since last tracing Confirmed by Eilleen Grates ([id number]) on 07/30/2023 10:35:48 AM    Risk Assessment/Calculations:     Physical Exam:   VS:  BP (!) 144/85 (BP Location: Right Arm, Patient Position: Sitting, Cuff Size: Large)   Pulse 91   Ht 5' (1.524 m)   Wt 234 lb 9.6 oz (106.4 kg)   LMP 04/01/2000 (Approximate)   SpO2 98%   BMI  45.82 kg/m    Wt Readings from Last 3 Encounters:  07/30/23 234 lb 9.6 oz (106.4 kg)  07/27/23 233 lb 14.5 oz (106.1 kg)  07/24/23 234 lb (106.1 kg)     GEN: Well nourished, well developed in no acute distress NECK: No JVD; No carotid bruits CARDIAC: RRR, soft brief apical systolic murmur radiating slightly at aortic outflow tract, no diastolic murmurs, rubs, gallops RESPIRATORY:  Clear to auscultation without rales, wheezing or rhonchi  ABDOMEN: Soft, non-tender, non-distended EXTREMITIES:  No edema; No deformity   ASSESSMENT AND PLAN:   Anemia Last hemoglobin was 12.2.  No change in therapy.  CAD She has had some atypical symptoms and some nonanginal.  No change in therapy.   Acute on chronic diastolic HF The EF was 40 - 45%  in the past but now 60 - 65%.  No change in therapy.  Dyslipidemia: She is on Repatha  and Zetia .  LDL is 85.  No change in therapy.   HTN Blood pressure is mildly elevated but this has been low previously so no change in therapy.    AVR She had normal valve function in August 2024.  She is due to have follow-up echo in August.  Sleep apnea: She was taken off of this by the prescribing doctor because this had improved.  She is happy about this.  Obesity: We talked about weight loss with diet and exercise and I suggested a plant forward Mediterranean approach.  She needs calorie restriction.     Follow up with me in 1 year  Signed, Eilleen Grates, MD

## 2023-07-30 ENCOUNTER — Ambulatory Visit: Attending: Cardiology | Admitting: Cardiology

## 2023-07-30 ENCOUNTER — Encounter: Payer: Self-pay | Admitting: Cardiology

## 2023-07-30 ENCOUNTER — Other Ambulatory Visit: Payer: Self-pay | Admitting: Cardiology

## 2023-07-30 VITALS — BP 144/85 | HR 91 | Ht 60.0 in | Wt 234.6 lb

## 2023-07-30 DIAGNOSIS — Z952 Presence of prosthetic heart valve: Secondary | ICD-10-CM

## 2023-07-30 DIAGNOSIS — R079 Chest pain, unspecified: Secondary | ICD-10-CM

## 2023-07-30 DIAGNOSIS — I10 Essential (primary) hypertension: Secondary | ICD-10-CM

## 2023-07-30 DIAGNOSIS — E785 Hyperlipidemia, unspecified: Secondary | ICD-10-CM

## 2023-07-30 DIAGNOSIS — I25810 Atherosclerosis of coronary artery bypass graft(s) without angina pectoris: Secondary | ICD-10-CM | POA: Diagnosis not present

## 2023-07-30 NOTE — Patient Instructions (Signed)
 Testing/Procedures: Keep upcoming echo appointment   Follow-Up: At [facility], you and your health needs are our priority.  As part of our continuing mission to provide you with exceptional heart care, our providers are all part of one team.  This team includes your primary Cardiologist (physician) and Advanced Practice Providers or APPs (Physician Assistants and Nurse Practitioners) who all work together to provide you with the care you need, when you need it.  Your next appointment:   1 year(s)  Provider:   Eilleen Grates, MD    We recommend signing up for the patient portal called "MyChart".  Sign up information is provided on this After Visit Summary.  MyChart is used to connect with patients for Virtual Visits (Telemedicine).  Patients are able to view lab/test results, encounter notes, upcoming appointments, etc.  Non-urgent messages can be sent to your provider as well.   To learn more about what you can do with MyChart, go to [email or web address].

## 2023-09-27 ENCOUNTER — Other Ambulatory Visit: Payer: Self-pay | Admitting: Cardiology

## 2023-09-29 NOTE — Telephone Encounter (Signed)
 Rx refill sent to pharmacy.

## 2023-10-18 ENCOUNTER — Other Ambulatory Visit: Payer: Self-pay | Admitting: Cardiology

## 2023-10-18 DIAGNOSIS — R0789 Other chest pain: Secondary | ICD-10-CM

## 2023-10-18 DIAGNOSIS — Z951 Presence of aortocoronary bypass graft: Secondary | ICD-10-CM

## 2023-10-18 DIAGNOSIS — I2510 Atherosclerotic heart disease of native coronary artery without angina pectoris: Secondary | ICD-10-CM

## 2023-10-18 DIAGNOSIS — I739 Peripheral vascular disease, unspecified: Secondary | ICD-10-CM

## 2023-11-18 ENCOUNTER — Other Ambulatory Visit: Payer: Self-pay | Admitting: Student

## 2023-11-18 ENCOUNTER — Ambulatory Visit (HOSPITAL_COMMUNITY): Payer: 59

## 2023-11-18 DIAGNOSIS — E785 Hyperlipidemia, unspecified: Secondary | ICD-10-CM

## 2023-11-20 ENCOUNTER — Other Ambulatory Visit: Payer: Self-pay | Admitting: Student

## 2023-11-20 DIAGNOSIS — E785 Hyperlipidemia, unspecified: Secondary | ICD-10-CM

## 2023-11-21 MED ORDER — EZETIMIBE 10 MG PO TABS: 10.0000 mg | ORAL_TABLET | Freq: Every day | ORAL | 2 refills | Status: AC

## 2023-12-16 ENCOUNTER — Ambulatory Visit (HOSPITAL_COMMUNITY)
Admission: RE | Admit: 2023-12-16 | Discharge: 2023-12-16 | Disposition: A | Discharge status: Home / Self Care | Source: Ambulatory Visit | Attending: Cardiovascular Disease | Admitting: Cardiovascular Disease

## 2023-12-16 DIAGNOSIS — I2510 Atherosclerotic heart disease of native coronary artery without angina pectoris: Secondary | ICD-10-CM | POA: Diagnosis present

## 2023-12-16 LAB — ECHOCARDIOGRAM COMPLETE
AR max vel: 0.62 cm2
AV Area VTI: 0.69 cm2
AV Area mean vel: 0.67 cm2
AV Mean grad: 18.8 mmHg
AV Peak grad: 30.6 mmHg
Ao pk vel: 2.77 m/s
Area-P 1/2: 4.39 cm2
S' Lateral: 2.9 cm

## 2023-12-21 ENCOUNTER — Ambulatory Visit: Payer: Self-pay | Admitting: Cardiology

## 2023-12-21 DIAGNOSIS — I5042 Chronic combined systolic (congestive) and diastolic (congestive) heart failure: Secondary | ICD-10-CM

## 2023-12-22 ENCOUNTER — Other Ambulatory Visit: Payer: Self-pay | Admitting: Cardiology

## 2023-12-23 NOTE — Telephone Encounter (Signed)
 Spoke with pt regarding her results. Echo ordered for April 2026. Pt verbalized understanding. All questions if any were answered.

## 2023-12-23 NOTE — Telephone Encounter (Signed)
-----   Message from Lynwood Schilling sent at 12/21/2023  2:34 PM EDT ----- EF is 40 - 45% as it was previously but lower than the most recent echo.  AVR looks OK.  I compared the echo from 2024 to this echo and I don't think it is significantly different.  I would like to  have a repeat echo before I see her back in April.  Please order an echo for April 2026.  No change in meds ----- Message ----- From: Interface, [employer] Sent: 12/16/2023   2:22 PM EDT To: Lynwood Schilling, MD

## 2024-03-13 ENCOUNTER — Other Ambulatory Visit: Payer: Self-pay | Admitting: Cardiology

## 2024-04-18 ENCOUNTER — Other Ambulatory Visit: Payer: Self-pay | Admitting: Cardiology

## 2024-04-20 ENCOUNTER — Telehealth: Payer: Self-pay | Admitting: Cardiology

## 2024-04-20 NOTE — Telephone Encounter (Signed)
" °*  STAT* If patient is at the pharmacy, call can be transferred to refill team.   1. Which medications need to be refilled? (please list name of each medication and dose if known)   Metoprolol  Tartrate 75 MG TABS    2. Would you like to learn more about the convenience, safety, & potential cost savings by using the [facility]? no   3. Are you open to using the [facility] (Type [facility]. no   4. Which pharmacy/location (including street and city if local pharmacy) is medication to be sent to?  [facility] [address] - [place], [place] - [address]   5. Do they need a 30 day or 90 day supply?  90 day     "

## 2024-04-27 NOTE — Telephone Encounter (Signed)
 Refill was done 04/20/24
